# Patient Record
Sex: Male | Born: 1955 | Race: Black or African American | Hispanic: No | Marital: Single | Smoking: Never smoker
Health system: Southern US, Community
[De-identification: ages and names within clinical notes are randomized; demographics above are authoritative.]

## PROBLEM LIST (undated history)

## (undated) DIAGNOSIS — R569 Unspecified convulsions: Secondary | ICD-10-CM

## (undated) DIAGNOSIS — I10 Essential (primary) hypertension: Secondary | ICD-10-CM

## (undated) DIAGNOSIS — E785 Hyperlipidemia, unspecified: Secondary | ICD-10-CM

## (undated) DIAGNOSIS — I509 Heart failure, unspecified: Secondary | ICD-10-CM

## (undated) DIAGNOSIS — R609 Edema, unspecified: Secondary | ICD-10-CM

## (undated) DIAGNOSIS — N183 Chronic kidney disease, stage 3 unspecified: Secondary | ICD-10-CM

## (undated) HISTORY — PX: NO PAST SURGERIES: SHX2092

---

## 2015-08-14 ENCOUNTER — Encounter: Payer: Self-pay | Admitting: Sports Medicine

## 2015-08-14 ENCOUNTER — Ambulatory Visit (INDEPENDENT_AMBULATORY_CARE_PROVIDER_SITE_OTHER): Payer: Medicare Other | Admitting: Sports Medicine

## 2015-08-14 DIAGNOSIS — B351 Tinea unguium: Secondary | ICD-10-CM | POA: Diagnosis not present

## 2015-08-14 DIAGNOSIS — L84 Corns and callosities: Secondary | ICD-10-CM

## 2015-08-14 DIAGNOSIS — M79673 Pain in unspecified foot: Secondary | ICD-10-CM

## 2015-08-14 DIAGNOSIS — L853 Xerosis cutis: Secondary | ICD-10-CM

## 2015-08-14 NOTE — Progress Notes (Signed)
Subjective: Nathan Shepherd is a 60 y.o. male patient seen today in office with complaint of painful callus and  thickened and elongated toenails; unable to trim. Patient denies history of Diabetes, Neuropathy, or Vascular disease. Patient has no other pedal complaints at this time.   [facility]  There are no active problems to display for this patient.   No current outpatient prescriptions on file prior to visit.   No current facility-administered medications on file prior to visit.     Not on File  Objective: Physical Exam  General: Well developed, nourished, no acute distress, awake, alert and oriented x 3  Vascular: Dorsalis pedis artery 2/4 bilateral, Posterior tibial artery 1/4 bilateral, skin temperature warm to warm proximal to distal bilateral lower extremities, no varicosities, scant pedal hair present bilateral.  Neurological: Gross sensation present via light touch bilateral.   Dermatological: Skin is warm, moderately dry but supple bilateral, Nails 1-10 are tender, long, thick, and discolored with mild subungal debris, no webspace macerations present bilateral, no open lesions present bilateral, + callus/hyperkeratotic tissue present left medial hallux. No signs of infection bilateral.  Musculoskeletal: No symptomatic boney deformities noted bilateral. Muscular strength within normal limits without painon range of motion. No pain with calf compression bilateral.  Assessment and Plan:  Problem List Items Addressed This Visit    None    Visit Diagnoses    Onychomycosis of toenail    -  Primary   Corn or callus       Dry skin       Foot pain, unspecified laterality         -Examined patient.  -Discussed treatment options for painful mycotic nails, callus and dry skin. -Mechanically debrided callus x 1 using sterile chisel blade and reduced mycotic nails with sterile nail nipper and dremel nail file without incident. -Recommend  daily skin emollients  -Patient to return in 3 months for follow up evaluation or sooner if symptoms worsen.  Asencion Islamitorya Caston Coopersmith, DPM

## 2015-11-20 ENCOUNTER — Ambulatory Visit (INDEPENDENT_AMBULATORY_CARE_PROVIDER_SITE_OTHER): Payer: Medicare Other | Admitting: Podiatry

## 2015-11-20 ENCOUNTER — Encounter: Payer: Self-pay | Admitting: Podiatry

## 2015-11-20 DIAGNOSIS — L603 Nail dystrophy: Secondary | ICD-10-CM

## 2015-11-20 DIAGNOSIS — B351 Tinea unguium: Secondary | ICD-10-CM | POA: Diagnosis not present

## 2015-11-20 DIAGNOSIS — E0843 Diabetes mellitus due to underlying condition with diabetic autonomic (poly)neuropathy: Secondary | ICD-10-CM

## 2015-11-20 DIAGNOSIS — L608 Other nail disorders: Secondary | ICD-10-CM

## 2015-11-20 DIAGNOSIS — M79609 Pain in unspecified limb: Secondary | ICD-10-CM | POA: Diagnosis not present

## 2015-12-06 NOTE — Progress Notes (Signed)
SUBJECTIVE Patient with a history of diabetes mellitus presents to office today complaining of elongated, thickened nails. Pain while ambulating in shoes. Patient is unable to trim their own nails.   Allergies  Allergen Reactions  . Penicillins Hives    OBJECTIVE General Patient is awake, alert, and oriented x 3 and in no acute distress. Derm Skin is dry and supple bilateral. Negative open lesions or macerations. Remaining integument unremarkable. Nails are tender, long, thickened and dystrophic with subungual debris, consistent with onychomycosis, 1-5 bilateral. No signs of infection noted. Vasc  DP and PT pedal pulses palpable bilaterally. Temperature gradient within normal limits.  Neuro Epicritic and protective threshold sensation diminished bilaterally.  Musculoskeletal Exam No symptomatic pedal deformities noted bilateral. Muscular strength within normal limits.  ASSESSMENT 1. Diabetes Mellitus w/ peripheral neuropathy 2. Onychomycosis of nail due to dermatophyte bilateral 3. Pain in foot bilateral  PLAN OF CARE 1. Patient evaluated today. 2. Instructed to maintain good pedal hygiene and foot care. Stressed importance of controlling blood sugar.  3. Mechanical debridement of nails 1-5 bilaterally performed using a nail nipper. Filed with dremel without incident.  4. Return to clinic in 3 mos.    Felecia ShellingBrent M Evans, DPM

## 2016-02-22 ENCOUNTER — Ambulatory Visit: Payer: Medicare Other | Admitting: Podiatry

## 2016-08-16 ENCOUNTER — Encounter (HOSPITAL_COMMUNITY): Payer: Self-pay | Admitting: Emergency Medicine

## 2016-08-16 ENCOUNTER — Inpatient Hospital Stay (HOSPITAL_COMMUNITY)
Admission: EM | Admit: 2016-08-16 | Discharge: 2016-08-19 | DRG: 100 | Disposition: A | Discharge status: Home / Self Care | Payer: Medicare Other | Attending: Emergency Medicine | Admitting: Emergency Medicine

## 2016-08-16 ENCOUNTER — Emergency Department (HOSPITAL_COMMUNITY): Payer: Medicare Other

## 2016-08-16 DIAGNOSIS — Z978 Presence of other specified devices: Secondary | ICD-10-CM

## 2016-08-16 DIAGNOSIS — Z79899 Other long term (current) drug therapy: Secondary | ICD-10-CM

## 2016-08-16 DIAGNOSIS — E785 Hyperlipidemia, unspecified: Secondary | ICD-10-CM | POA: Diagnosis present

## 2016-08-16 DIAGNOSIS — I480 Paroxysmal atrial fibrillation: Secondary | ICD-10-CM | POA: Diagnosis present

## 2016-08-16 DIAGNOSIS — G40909 Epilepsy, unspecified, not intractable, without status epilepticus: Secondary | ICD-10-CM | POA: Diagnosis not present

## 2016-08-16 DIAGNOSIS — E876 Hypokalemia: Secondary | ICD-10-CM | POA: Diagnosis present

## 2016-08-16 DIAGNOSIS — Z7901 Long term (current) use of anticoagulants: Secondary | ICD-10-CM

## 2016-08-16 DIAGNOSIS — Z8673 Personal history of transient ischemic attack (TIA), and cerebral infarction without residual deficits: Secondary | ICD-10-CM

## 2016-08-16 DIAGNOSIS — R32 Unspecified urinary incontinence: Secondary | ICD-10-CM | POA: Diagnosis present

## 2016-08-16 DIAGNOSIS — I509 Heart failure, unspecified: Secondary | ICD-10-CM | POA: Diagnosis present

## 2016-08-16 DIAGNOSIS — Z880 Allergy status to penicillin: Secondary | ICD-10-CM

## 2016-08-16 DIAGNOSIS — R4182 Altered mental status, unspecified: Secondary | ICD-10-CM | POA: Diagnosis not present

## 2016-08-16 DIAGNOSIS — Z9911 Dependence on respirator [ventilator] status: Secondary | ICD-10-CM

## 2016-08-16 DIAGNOSIS — R609 Edema, unspecified: Secondary | ICD-10-CM | POA: Insufficient documentation

## 2016-08-16 DIAGNOSIS — E1165 Type 2 diabetes mellitus with hyperglycemia: Secondary | ICD-10-CM | POA: Diagnosis present

## 2016-08-16 DIAGNOSIS — G9340 Encephalopathy, unspecified: Secondary | ICD-10-CM | POA: Diagnosis present

## 2016-08-16 DIAGNOSIS — Z7982 Long term (current) use of aspirin: Secondary | ICD-10-CM

## 2016-08-16 DIAGNOSIS — I672 Cerebral atherosclerosis: Secondary | ICD-10-CM | POA: Diagnosis present

## 2016-08-16 DIAGNOSIS — I130 Hypertensive heart and chronic kidney disease with heart failure and stage 1 through stage 4 chronic kidney disease, or unspecified chronic kidney disease: Secondary | ICD-10-CM | POA: Diagnosis present

## 2016-08-16 DIAGNOSIS — E1122 Type 2 diabetes mellitus with diabetic chronic kidney disease: Secondary | ICD-10-CM | POA: Diagnosis present

## 2016-08-16 DIAGNOSIS — N183 Chronic kidney disease, stage 3 (moderate): Secondary | ICD-10-CM | POA: Diagnosis present

## 2016-08-16 HISTORY — DX: Edema, unspecified: R60.9

## 2016-08-16 HISTORY — DX: Unspecified convulsions: R56.9

## 2016-08-16 HISTORY — DX: Heart failure, unspecified: I50.9

## 2016-08-16 HISTORY — DX: Chronic kidney disease, stage 3 unspecified: N18.30

## 2016-08-16 HISTORY — DX: Hyperlipidemia, unspecified: E78.5

## 2016-08-16 HISTORY — DX: Essential (primary) hypertension: I10

## 2016-08-16 HISTORY — DX: Chronic kidney disease, stage 3 (moderate): N18.3

## 2016-08-16 LAB — COMPREHENSIVE METABOLIC PANEL
ALBUMIN: 4.4 g/dL (ref 3.5–5.0)
ALT: 13 U/L — AB (ref 17–63)
AST: 17 U/L (ref 15–41)
Alkaline Phosphatase: 95 U/L (ref 38–126)
Anion gap: 6 (ref 5–15)
BILIRUBIN TOTAL: 0.4 mg/dL (ref 0.3–1.2)
BUN: 13 mg/dL (ref 6–20)
CHLORIDE: 99 mmol/L — AB (ref 101–111)
CO2: 31 mmol/L (ref 22–32)
Calcium: 8.9 mg/dL (ref 8.9–10.3)
Creatinine, Ser: 1.4 mg/dL — ABNORMAL HIGH (ref 0.61–1.24)
GFR calc Af Amer: >60 mL/min (ref >60)
GFR, EST NON AFRICAN AMERICAN: 54 mL/min — AB (ref >60)
GLUCOSE: 156 mg/dL — AB (ref 65–99)
Potassium: 3.9 mmol/L (ref 3.5–5.1)
Sodium: 136 mmol/L (ref 135–145)
Total Protein: 8.2 g/dL — ABNORMAL HIGH (ref 6.5–8.1)

## 2016-08-16 LAB — DIFFERENTIAL
BASOS ABS: 0.1 10*3/uL (ref 0.0–0.1)
BASOS PCT: 1 %
Eosinophils Absolute: 0.1 10*3/uL (ref 0.0–0.7)
Eosinophils Relative: 2 %
LYMPHS ABS: 2.3 10*3/uL (ref 0.7–4.0)
Lymphocytes Relative: 30 %
MONOS PCT: 10 %
Monocytes Absolute: 0.7 10*3/uL (ref 0.1–1.0)
NEUTROS ABS: 4.5 10*3/uL (ref 1.7–7.7)
NEUTROS PCT: 57 %

## 2016-08-16 LAB — BLOOD GAS, ARTERIAL
Acid-Base Excess: 1.8 mmol/L (ref 0.0–2.0)
Bicarbonate: 24.4 mmol/L (ref 20.0–28.0)
DRAWN BY: 28459
FIO2: 100
O2 SAT: 99.5 %
PATIENT TEMPERATURE: 37
PEEP: 5 cmH2O
PH ART: 7.248 — AB (ref 7.350–7.450)
RATE: 16 resp/min
VT: 570 mL
pO2, Arterial: 445 mmHg — ABNORMAL HIGH (ref 83.0–108.0)

## 2016-08-16 LAB — URINALYSIS, ROUTINE W REFLEX MICROSCOPIC
Bilirubin Urine: NEGATIVE
Glucose, UA: >=500 mg/dL — AB
KETONES UR: NEGATIVE mg/dL
Leukocytes, UA: NEGATIVE
Nitrite: NEGATIVE
PH: 6 (ref 5.0–8.0)
Protein, ur: >=300 mg/dL — AB
SPECIFIC GRAVITY, URINE: 1.014 (ref 1.005–1.030)

## 2016-08-16 LAB — CBC
HEMATOCRIT: 42.8 % (ref 39.0–52.0)
HEMOGLOBIN: 13.3 g/dL (ref 13.0–17.0)
MCH: 25.4 pg — ABNORMAL LOW (ref 26.0–34.0)
MCHC: 31.1 g/dL (ref 30.0–36.0)
MCV: 81.8 fL (ref 78.0–100.0)
Platelets: 213 10*3/uL (ref 150–400)
RBC: 5.23 MIL/uL (ref 4.22–5.81)
RDW: 14.7 % (ref 11.5–15.5)
WBC: 7.8 10*3/uL (ref 4.0–10.5)

## 2016-08-16 LAB — RAPID URINE DRUG SCREEN, HOSP PERFORMED
Amphetamines: NOT DETECTED
BARBITURATES: NOT DETECTED
BENZODIAZEPINES: NOT DETECTED
COCAINE: NOT DETECTED
Opiates: NOT DETECTED
TETRAHYDROCANNABINOL: NOT DETECTED

## 2016-08-16 LAB — APTT: APTT: 33 s (ref 24–36)

## 2016-08-16 LAB — I-STAT TROPONIN, ED: TROPONIN I, POC: 0 ng/mL (ref 0.00–0.08)

## 2016-08-16 LAB — I-STAT CHEM 8, ED
BUN: 14 mg/dL (ref 6–20)
CHLORIDE: 99 mmol/L — AB (ref 101–111)
CREATININE: 1.4 mg/dL — AB (ref 0.61–1.24)
Calcium, Ion: 1.21 mmol/L (ref 1.15–1.40)
GLUCOSE: 164 mg/dL — AB (ref 65–99)
HCT: 42 % (ref 39.0–52.0)
Hemoglobin: 14.3 g/dL (ref 13.0–17.0)
POTASSIUM: 4.4 mmol/L (ref 3.5–5.1)
Sodium: 139 mmol/L (ref 135–145)
TCO2: 33 mmol/L (ref 0–100)

## 2016-08-16 LAB — PROTIME-INR
INR: 1.79
Prothrombin Time: 21 seconds — ABNORMAL HIGH (ref 11.4–15.2)

## 2016-08-16 LAB — ETHANOL: Alcohol, Ethyl (B): <5 mg/dL (ref <5)

## 2016-08-16 LAB — CBG MONITORING, ED: Glucose-Capillary: 139 mg/dL — ABNORMAL HIGH (ref 65–99)

## 2016-08-16 MED ORDER — SUCCINYLCHOLINE CHLORIDE 20 MG/ML IJ SOLN
INTRAMUSCULAR | Status: AC
  Filled 2016-08-16: qty 1

## 2016-08-16 MED ORDER — ETOMIDATE 2 MG/ML IV SOLN
15.0000 mg | Freq: Once | INTRAVENOUS | Status: AC
  Administered 2016-08-16: 15 mg via INTRAVENOUS

## 2016-08-16 MED ORDER — PROPOFOL 1000 MG/100ML IV EMUL
5.0000 ug/kg/min | Freq: Once | INTRAVENOUS | Status: AC
  Administered 2016-08-16: 25.523 ug/kg/min via INTRAVENOUS
  Administered 2016-08-16: 12.762 ug/kg/min via INTRAVENOUS
  Administered 2016-08-16 – 2016-08-17 (×3): 25.523 ug/kg/min via INTRAVENOUS

## 2016-08-16 MED ORDER — SUCCINYLCHOLINE CHLORIDE 20 MG/ML IJ SOLN
75.0000 mg | Freq: Once | INTRAMUSCULAR | Status: AC
  Administered 2016-08-16: 75 mg via INTRAVENOUS

## 2016-08-16 MED ORDER — PROPOFOL 1000 MG/100ML IV EMUL
INTRAVENOUS | Status: AC
  Administered 2016-08-16: 12.762 ug/kg/min via INTRAVENOUS
  Filled 2016-08-16: qty 100

## 2016-08-16 MED ORDER — ETOMIDATE 2 MG/ML IV SOLN
10.0000 mg | Freq: Once | INTRAVENOUS | Status: AC
  Administered 2016-08-16: 10 mg via INTRAVENOUS

## 2016-08-16 MED ORDER — SUCCINYLCHOLINE CHLORIDE 20 MG/ML IJ SOLN
50.0000 mg | Freq: Once | INTRAMUSCULAR | Status: AC
  Administered 2016-08-16: 50 mg via INTRAVENOUS

## 2016-08-16 MED ORDER — SUCCINYLCHOLINE CHLORIDE 20 MG/ML IJ SOLN
125.0000 mg | Freq: Once | INTRAMUSCULAR | Status: AC
  Administered 2016-08-16: 125 mg via INTRAVENOUS

## 2016-08-16 NOTE — ED Notes (Signed)
Date and time results received: 08/16/16 2312   Test: CO2 Critical Value: 67.4  Name of Provider Notified: Dr. Adriana Simasook  Orders Received? Or Actions Taken?: N/A

## 2016-08-16 NOTE — Progress Notes (Signed)
ABG obtained. Following critical ABG value, ventilator rate increased from 16 to 20. Repeat ABG in I hour.

## 2016-08-16 NOTE — ED Provider Notes (Signed)
Procedure Name: Intubation Date/Time: 08/16/2016 10:19 PM Performed by: Maia PlanLONG, JOSHUA G Pre-anesthesia Checklist: Patient identified, Patient being monitored, Emergency Drugs available and Suction available Oxygen Delivery Method: Ambu bag Preoxygenation: Pre-oxygenation with 100% oxygen Induction Type: Rapid sequence Ventilation: Two handed mask ventilation required Laryngoscope Size: Glidescope and 4 Tube size: 7.0 mm Number of attempts: 1 Airway Equipment and Method: Video-laryngoscopy Placement Confirmation: ETT inserted through vocal cords under direct vision,  Positive ETCO2 and Breath sounds checked- equal and bilateral Secured at: 26 cm Tube secured with: ETT holder Difficulty Due To: Difficult Airway- due to limited oral opening, Difficult Airway- due to dentition, Difficult Airway- due to anterior larynx, Difficult Airway- due to large tongue and Difficult Airway-  due to edematous airway Future Recommendations: Recommend- induction with short-acting agent, and alternative techniques readily available      Alona BeneJoshua Long, MD   Long, Arlyss RepressJoshua G, MD 08/16/16 2221

## 2016-08-16 NOTE — ED Triage Notes (Signed)
Pt found by staff at facility to by incontinent of urine and drooling; upon transport to hospital pt became unresponsive; pt's pupils 2mm and slow to respond; cbg 104; pt is usually alert and oriented and able to walk

## 2016-08-16 NOTE — ED Provider Notes (Addendum)
[facility]-EMER[facility]ENCY DEPT Provider Note   CSN: [id number] Arrival date & time: 08/16/16  2150   An emergency department physician performed an initial assessment on this suspected stroke patient at 2150.  History   Chief Complaint Chief Complaint  Patient presents with  . unresponsive    HPI Nathan Shepherd is a 61 y.o. male.  Level V caveat for obtundation. Minimal history available to me. Patient apparently lives in a group home and be came "unresponsive".  No known prodromal  Illnesses.past medical history includes hypertension, chronic kidney disease, CHF, hyperlipidemia, seizures. No past emergency visits noticed in EPIC.      Past Medical History:  Diagnosis Date  . CHF (congestive heart failure) (HCC)   . CKD (chronic kidney disease) stage 3, GFR 30-59 ml/min   . Edema   . Hyperlipemia   . Hypertension   . Seizures [facility])     Patient Active Problem List   Diagnosis Date Noted  . Edema     No past surgical history on file.     Home Medications    Prior to Admission medications   Medication Sig Start Date End Date Taking? Authorizing Provider  aspirin EC 81 MG tablet Take 81 mg by mouth daily.   Yes [provider]  carvedilol (COREG) 12.5 MG tablet Take 12.5 mg by mouth 2 (two) times daily with a meal.   Yes [provider]  levETIRAcetam (KEPPRA) 500 MG tablet Take 500 mg by mouth 2 (two) times daily.   Yes [provider]  losartan (COZAAR) 50 MG tablet Take 50 mg by mouth daily. Take 2 tabs by  Mouth daily   Yes [provider]  NIFEdipine (PROCARDIA-XL/ADALAT CC) 60 MG 24 hr tablet Take 60 mg by mouth daily. Take 2 tabs my mouth once daily   Yes [provider]  rosuvastatin (CRESTOR) 20 MG tablet Take 20 mg by mouth daily.   Yes [provider]  torsemide (DEMADEX) 20 MG tablet Take 20 mg by mouth daily.   Yes [provider]  warfarin (COUMADIN) 7.5 MG tablet Take 7.5 mg by mouth daily.    Yes [provider]  warfarin (COUMADIN) 1 MG tablet Take 1 mg by mouth daily.    [provider]    Family History No family history on file.  Social History Social History  Substance Use Topics  . Smoking status: Unknown If Ever Smoked  . Smokeless tobacco: Not on file  . Alcohol use Not on file     Allergies   Penicillins   Review of Systems Review of Systems  Reason unable to perform ROS: obtundation.     Physical Exam Updated Vital Signs BP (!) 153/110   Pulse 82   Temp 98.6 F (37 C)   Resp (!) 21   Ht 6\' 4"  (1.93 m)   Wt 130.6 kg (288 lb)   SpO2 100%   BMI 35.06 kg/m   Physical Exam  Constitutional: He appears well-developed and well-nourished.  obtunded  HENT:  Head: Normocephalic and atraumatic.  Eyes: Conjunctivae are normal.  Neck: Neck supple.  Cardiovascular: Normal rate and regular rhythm.   Pulmonary/Chest: Effort normal and breath sounds normal.  Abdominal: Soft. Bowel sounds are normal.  Musculoskeletal:  unable  Neurological:  obtunded  Skin: Skin is warm and dry.  Psychiatric:  unable  Nursing note and vitals reviewed.    ED Treatments / Results  Labs (all labs ordered are listed, but only abnormal results  are displayed) Labs Reviewed  PROTIME-INR - Abnormal; Notable for the following:       Result Value   Prothrombin Time 21.0 (*)    All other components within normal limits  CBC - Abnormal; Notable for the following:    MCH 25.4 (*)    All other components within normal limits  COMPREHENSIVE METABOLIC PANEL - Abnormal; Notable for the following:    Chloride 99 (*)    Glucose, Bld 156 (*)    Creatinine, Ser 1.40 (*)    Total Protein 8.2 (*)    ALT 13 (*)    GFR calc non Af Amer 54 (*)    All other components within normal limits  URINALYSIS, ROUTINE W REFLEX MICROSCOPIC - Abnormal; Notable for the following:    Glucose, UA >=500 (*)    Hgb urine dipstick SMALL (*)    Protein, ur >=300 (*)     Bacteria, UA RARE (*)    Squamous Epithelial / LPF 0-5 (*)    All other components within normal limits  BLOOD GAS, ARTERIAL - Abnormal; Notable for the following:    pH, Arterial 7.248 (*)    pO2, Arterial 445 (*)    All other components within normal limits  CBG MONITORING, ED - Abnormal; Notable for the following:    Glucose-Capillary 139 (*)    All other components within normal limits  I-STAT CHEM 8, ED - Abnormal; Notable for the following:    Chloride 99 (*)    Creatinine, Ser 1.40 (*)    Glucose, Bld 164 (*)    All other components within normal limits  ETHANOL  APTT  DIFFERENTIAL  RAPID URINE DRUG SCREEN, HOSP PERFORMED  BLOOD GAS, ARTERIAL  I-STAT TROPONIN, ED  CBG MONITORING, ED    EKG  EKG Interpretation None       Radiology Dg Chest Port 1 View  Result Date: 08/16/2016 CLINICAL DATA:  Respiratory failure. EXAM: PORTABLE CHEST 1 VIEW COMPARISON:  None. FINDINGS: Endotracheal tube tip is at the level of the clavicular heads, 7 cm above the inferior margin of the carina. There is cardiomegaly with mild pulmonary vascular congestion without overt edema. No pneumothorax or sizable pleural effusion. IMPRESSION: Endotracheal tube tip at the level of the clavicular heads. Massive cardiomegaly without overt pulmonary edema. Electronically Signed   By: Deatra Robinson M.D.   On: 08/16/2016 22:52   Ct Head Code Stroke Wo Contrast`  Result Date: 08/16/2016 CLINICAL DATA:  Code stroke. EXAM: CT HEAD WITHOUT CONTRAST TECHNIQUE: Contiguous axial images were obtained from the base of the skull through the vertex without intravenous contrast. COMPARISON:  None. FINDINGS: Brain: No mass lesion or acute hemorrhage. Hypoattenuation in the left occipital lobe, likely old left PCA infarct. There is periventricular hypoattenuation compatible with chronic microvascular disease. Old left thalamic lacunar infarct. Vascular: No hyperdense vessel. No advanced atherosclerotic calcification of the  arteries at the skull base. Skull: Normal visualized skull base, calvarium and extracranial soft tissues. Sinuses/Orbits: Extensive opacification of the paranasal sinuses, including fluid levels sphenoid sinuses. This is probably partially due to intubation. There may be superimposed element of chronic sinusitis. Normal orbits. ASPECTS [facility] Stroke Program Early CT Score) - Ganglionic level infarction (caudate, lentiform nuclei, internal capsule, insula, M1-M3 cortex): 7 - Supraganglionic infarction (M4-M6 cortex): 3 Total score (0-10 with 10 being normal): 10 IMPRESSION: 1. No acute hemorrhage or mass lesion. 2. ASPECTS is 10. 3. 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 These results were called  by telephone at the time of interpretation on 08/16/2016 at 10:56 pm to Dr. Donnetta HutchingBRIAN Le Ferraz , who verbally acknowledged these results. Electronically Signed   By: Deatra RobinsonKevin  Herman M.D.   On: 08/16/2016 22:57    Procedures Procedures (including critical care time)  Medications Ordered in ED Medications  succinylcholine (ANECTINE) 20 MG/ML injection (not administered)  propofol (DIPRIVAN) 1000 MG/100ML infusion (31.904 mcg/kg/min  130.6 kg Intravenous Rate/Dose Change 08/16/16 2301)  etomidate (AMIDATE) injection 15 mg (15 mg Intravenous Given 08/16/16 2155)  succinylcholine (ANECTINE) injection 125 mg (125 mg Intravenous Given 08/16/16 2156)  succinylcholine (ANECTINE) injection 50 mg (50 mg Intravenous Given 08/16/16 2215)  succinylcholine (ANECTINE) injection 75 mg (75 mg Intravenous Given 08/16/16 2210)  etomidate (AMIDATE) injection 10 mg (10 mg Intravenous Given 08/16/16 2215)  succinylcholine (ANECTINE) injection 50 mg (50 mg Intravenous Given 08/16/16 2200)     Initial Impression / Assessment and Plan / ED Course  I have reviewed the triage vital signs and the nursing notes.  Pertinent labs & imaging results that were available during my care of the patient were reviewed by me and considered  in my medical decision making (see chart for details).     Uncertain etiology of patient's obtundation.  Glucose was normal.CT head showed evidence of old stroke but no acute hemorrhage.  Drug screen negative. Chest x-ray shows no pneumonia. Urinalysis neg. Code stroke initiated. Patient was intubated. Neurologist did not think thrombolytics were appropriate secondary to increased INR.  [facility].  [id number]:  [facility] Performed by: Donnetta HutchingOOK,Antolin Belsito  ?  Total critical care time: 50 minutes  Critical care time was exclusive of separately billable procedures and treating other patients.  Critical care was necessary to treat or prevent imminent or life-threatening deterioration.  Critical care was time spent personally by me on the following activities: development of treatment plan with patient and/or surrogate as well as nursing, discussions with consultants, evaluation of patient's response to treatment, examination of patient, obtaining history from patient or surrogate, ordering and performing treatments and interventions, ordering and review of laboratory studies, ordering and review of radiographic studies, pulse oximetry and re-evaluation of patient's condition.  Final Clinical Impressions(s) / ED Diagnoses   Final diagnoses:  Altered mental status, unspecified altered mental status type  Endotracheally intubated    New Prescriptions New Prescriptions   No medications on file     Donnetta Hutchingook, Elzie Sheets, MD 08/16/16 2359    Donnetta Hutchingook, Ayad Nieman, MD 08/17/16 [phone]    Donnetta Hutchingook, Odell Fasching, MD 08/17/16 [phone]

## 2016-08-16 NOTE — ED Notes (Signed)
Code Stroke activated @ 2145 prior to arrival via in code of EMS. SOC activated @ 2200

## 2016-08-17 ENCOUNTER — Encounter (HOSPITAL_COMMUNITY): Payer: Self-pay

## 2016-08-17 ENCOUNTER — Inpatient Hospital Stay (HOSPITAL_COMMUNITY): Payer: Medicare Other

## 2016-08-17 DIAGNOSIS — R569 Unspecified convulsions: Secondary | ICD-10-CM | POA: Diagnosis not present

## 2016-08-17 DIAGNOSIS — J9600 Acute respiratory failure, unspecified whether with hypoxia or hypercapnia: Secondary | ICD-10-CM | POA: Diagnosis not present

## 2016-08-17 DIAGNOSIS — Z880 Allergy status to penicillin: Secondary | ICD-10-CM | POA: Diagnosis not present

## 2016-08-17 DIAGNOSIS — I509 Heart failure, unspecified: Secondary | ICD-10-CM | POA: Diagnosis not present

## 2016-08-17 DIAGNOSIS — E785 Hyperlipidemia, unspecified: Secondary | ICD-10-CM | POA: Diagnosis not present

## 2016-08-17 DIAGNOSIS — I517 Cardiomegaly: Secondary | ICD-10-CM

## 2016-08-17 DIAGNOSIS — G40909 Epilepsy, unspecified, not intractable, without status epilepticus: Secondary | ICD-10-CM | POA: Diagnosis present

## 2016-08-17 DIAGNOSIS — E1165 Type 2 diabetes mellitus with hyperglycemia: Secondary | ICD-10-CM | POA: Diagnosis not present

## 2016-08-17 DIAGNOSIS — Z7901 Long term (current) use of anticoagulants: Secondary | ICD-10-CM | POA: Diagnosis not present

## 2016-08-17 DIAGNOSIS — I130 Hypertensive heart and chronic kidney disease with heart failure and stage 1 through stage 4 chronic kidney disease, or unspecified chronic kidney disease: Secondary | ICD-10-CM | POA: Diagnosis not present

## 2016-08-17 DIAGNOSIS — R4182 Altered mental status, unspecified: Secondary | ICD-10-CM | POA: Diagnosis present

## 2016-08-17 DIAGNOSIS — N183 Chronic kidney disease, stage 3 (moderate): Secondary | ICD-10-CM | POA: Diagnosis not present

## 2016-08-17 DIAGNOSIS — G9340 Encephalopathy, unspecified: Secondary | ICD-10-CM | POA: Diagnosis not present

## 2016-08-17 DIAGNOSIS — I672 Cerebral atherosclerosis: Secondary | ICD-10-CM | POA: Diagnosis not present

## 2016-08-17 DIAGNOSIS — Z79899 Other long term (current) drug therapy: Secondary | ICD-10-CM | POA: Diagnosis not present

## 2016-08-17 DIAGNOSIS — R32 Unspecified urinary incontinence: Secondary | ICD-10-CM | POA: Diagnosis not present

## 2016-08-17 DIAGNOSIS — Z8673 Personal history of transient ischemic attack (TIA), and cerebral infarction without residual deficits: Secondary | ICD-10-CM | POA: Diagnosis not present

## 2016-08-17 DIAGNOSIS — E1122 Type 2 diabetes mellitus with diabetic chronic kidney disease: Secondary | ICD-10-CM | POA: Diagnosis not present

## 2016-08-17 DIAGNOSIS — Z7982 Long term (current) use of aspirin: Secondary | ICD-10-CM | POA: Diagnosis not present

## 2016-08-17 DIAGNOSIS — E876 Hypokalemia: Secondary | ICD-10-CM | POA: Diagnosis not present

## 2016-08-17 DIAGNOSIS — I480 Paroxysmal atrial fibrillation: Secondary | ICD-10-CM | POA: Diagnosis not present

## 2016-08-17 LAB — ECHOCARDIOGRAM COMPLETE
HEIGHTINCHES: 76 in
WEIGHTICAEL: 4091.74 [oz_av]

## 2016-08-17 LAB — HEPARIN LEVEL (UNFRACTIONATED): Heparin Unfractionated: 0.25 IU/mL — ABNORMAL LOW (ref 0.30–0.70)

## 2016-08-17 LAB — BASIC METABOLIC PANEL
Anion gap: 10 (ref 5–15)
BUN: 9 mg/dL (ref 6–20)
CHLORIDE: 100 mmol/L — AB (ref 101–111)
CO2: 29 mmol/L (ref 22–32)
Calcium: 9.4 mg/dL (ref 8.9–10.3)
Creatinine, Ser: 1.37 mg/dL — ABNORMAL HIGH (ref 0.61–1.24)
GFR calc non Af Amer: 55 mL/min — ABNORMAL LOW (ref >60)
Glucose, Bld: 112 mg/dL — ABNORMAL HIGH (ref 65–99)
POTASSIUM: 4.1 mmol/L (ref 3.5–5.1)
Sodium: 139 mmol/L (ref 135–145)

## 2016-08-17 LAB — MAGNESIUM: MAGNESIUM: 1.9 mg/dL (ref 1.7–2.4)

## 2016-08-17 LAB — GLUCOSE, CAPILLARY
GLUCOSE-CAPILLARY: 108 mg/dL — AB (ref 65–99)
GLUCOSE-CAPILLARY: 116 mg/dL — AB (ref 65–99)
GLUCOSE-CAPILLARY: 143 mg/dL — AB (ref 65–99)
Glucose-Capillary: 111 mg/dL — ABNORMAL HIGH (ref 65–99)
Glucose-Capillary: 184 mg/dL — ABNORMAL HIGH (ref 65–99)
Glucose-Capillary: 137 mg/dL — ABNORMAL HIGH (ref 65–99)

## 2016-08-17 LAB — APTT: aPTT: 45 seconds — ABNORMAL HIGH (ref 24–36)

## 2016-08-17 LAB — AMMONIA: AMMONIA: 42 umol/L — AB (ref 9–35)

## 2016-08-17 LAB — PROCALCITONIN: PROCALCITONIN: 0.11 ng/mL

## 2016-08-17 LAB — BLOOD GAS, ARTERIAL
Acid-Base Excess: 3.6 mmol/L — ABNORMAL HIGH (ref 0.0–2.0)
BICARBONATE: 27 mmol/L (ref 20.0–28.0)
Drawn by: 28459
FIO2: 40
O2 Saturation: 98.7 %
PATIENT TEMPERATURE: 37
PCO2 ART: 50.8 mmHg — AB (ref 32.0–48.0)
PEEP: 5 cmH2O
PO2 ART: 148 mmHg — AB (ref 83.0–108.0)
RATE: 20 resp/min
VT: 570 mL
pH, Arterial: 7.368 (ref 7.350–7.450)

## 2016-08-17 LAB — CBC
HEMATOCRIT: 42.9 % (ref 39.0–52.0)
HEMOGLOBIN: 13.3 g/dL (ref 13.0–17.0)
MCH: 24.9 pg — ABNORMAL LOW (ref 26.0–34.0)
MCHC: 31 g/dL (ref 30.0–36.0)
MCV: 80.3 fL (ref 78.0–100.0)
Platelets: 182 10*3/uL (ref 150–400)
RBC: 5.34 MIL/uL (ref 4.22–5.81)
RDW: 15.1 % (ref 11.5–15.5)
WBC: 7.2 10*3/uL (ref 4.0–10.5)

## 2016-08-17 LAB — MRSA PCR SCREENING: MRSA by PCR: NEGATIVE

## 2016-08-17 LAB — LACTIC ACID, PLASMA
Lactic Acid, Venous: 2.1 mmol/L (ref 0.5–1.9)
Lactic Acid, Venous: 1.2 mmol/L (ref 0.5–1.9)

## 2016-08-17 LAB — PHOSPHORUS: Phosphorus: 1.6 mg/dL — ABNORMAL LOW (ref 2.5–4.6)

## 2016-08-17 MED ORDER — SODIUM CHLORIDE 0.9 % IV SOLN
500.0000 mg | Freq: Once | INTRAVENOUS | Status: AC
  Administered 2016-08-17: 500 mg via INTRAVENOUS
  Filled 2016-08-17: qty 5

## 2016-08-17 MED ORDER — METRONIDAZOLE IN NACL 5-0.79 MG/ML-% IV SOLN
500.0000 mg | Freq: Three times a day (TID) | INTRAVENOUS | Status: DC
  Administered 2016-08-17 – 2016-08-18 (×4): 500 mg via INTRAVENOUS
  Filled 2016-08-17 (×6): qty 100

## 2016-08-17 MED ORDER — PROPOFOL 1000 MG/100ML IV EMUL
INTRAVENOUS | Status: AC
  Filled 2016-08-17: qty 100

## 2016-08-17 MED ORDER — CEFEPIME HCL 1 G IJ SOLR
1.0000 g | Freq: Three times a day (TID) | INTRAMUSCULAR | Status: DC
  Administered 2016-08-17 – 2016-08-18 (×4): 1 g via INTRAVENOUS
  Filled 2016-08-17 (×5): qty 1

## 2016-08-17 MED ORDER — HYDRALAZINE HCL 20 MG/ML IJ SOLN: 10.0000 mg | Freq: Four times a day (QID) | INTRAMUSCULAR | Status: DC | PRN

## 2016-08-17 MED ORDER — SODIUM CHLORIDE 0.9 % IV SOLN
1000.0000 mg | Freq: Two times a day (BID) | INTRAVENOUS | Status: DC
  Administered 2016-08-18: 1000 mg via INTRAVENOUS
  Filled 2016-08-17: qty 10

## 2016-08-17 MED ORDER — SODIUM PHOSPHATES 45 MMOLE/15ML IV SOLN
20.0000 mmol | Freq: Once | INTRAVENOUS | Status: AC
  Administered 2016-08-17: 20 mmol via INTRAVENOUS
  Filled 2016-08-17: qty 6.67

## 2016-08-17 MED ORDER — INSULIN ASPART 100 UNIT/ML ~~LOC~~ SOLN
2.0000 [IU] | SUBCUTANEOUS | Status: DC
  Administered 2016-08-17: 2 [IU] via SUBCUTANEOUS
  Administered 2016-08-17: 4 [IU] via SUBCUTANEOUS
  Administered 2016-08-18: 2 [IU] via SUBCUTANEOUS

## 2016-08-17 MED ORDER — PROPOFOL 1000 MG/100ML IV EMUL: 5.0000 ug/kg/min | INTRAVENOUS | Status: DC

## 2016-08-17 MED ORDER — LORAZEPAM 2 MG/ML IJ SOLN
INTRAMUSCULAR | Status: AC
  Filled 2016-08-17: qty 1

## 2016-08-17 MED ORDER — WARFARIN SODIUM 7.5 MG PO TABS
7.5000 mg | ORAL_TABLET | Freq: Once | ORAL | Status: AC
  Administered 2016-08-17: 7.5 mg via ORAL
  Filled 2016-08-17: qty 1

## 2016-08-17 MED ORDER — SODIUM CHLORIDE 0.9 % IV SOLN: 250.0000 mL | INTRAVENOUS | Status: DC | PRN

## 2016-08-17 MED ORDER — SODIUM CHLORIDE 0.9 % IV SOLN
INTRAVENOUS | Status: DC
  Administered 2016-08-17: 04:00:00 via INTRAVENOUS

## 2016-08-17 MED ORDER — MAGNESIUM SULFATE 2 GM/50ML IV SOLN
2.0000 g | Freq: Once | INTRAVENOUS | Status: AC
Start: 2016-08-17 — End: 2016-08-17
  Administered 2016-08-17: 2 g via INTRAVENOUS
  Filled 2016-08-17: qty 50

## 2016-08-17 MED ORDER — CARVEDILOL 12.5 MG PO TABS
12.5000 mg | ORAL_TABLET | Freq: Two times a day (BID) | ORAL | Status: DC
  Administered 2016-08-17 – 2016-08-19 (×4): 12.5 mg via ORAL
  Filled 2016-08-17 (×5): qty 1

## 2016-08-17 MED ORDER — HEPARIN SODIUM (PORCINE) 5000 UNIT/ML IJ SOLN: 5000.0000 [IU] | Freq: Three times a day (TID) | INTRAMUSCULAR | Status: DC

## 2016-08-17 MED ORDER — WARFARIN - PHYSICIAN DOSING INPATIENT
Freq: Every day | Status: DC
  Administered 2016-08-17: 18:00:00

## 2016-08-17 MED ORDER — HEPARIN (PORCINE) IN NACL 100-0.45 UNIT/ML-% IJ SOLN
1550.0000 [IU]/h | INTRAMUSCULAR | Status: DC
  Administered 2016-08-17: 1400 [IU]/h via INTRAVENOUS
  Filled 2016-08-17 (×3): qty 250

## 2016-08-17 MED ORDER — IOPAMIDOL (ISOVUE-370) INJECTION 76%
100.0000 mL | Freq: Once | INTRAVENOUS | Status: AC | PRN
  Administered 2016-08-17: 100 mL via INTRAVENOUS

## 2016-08-17 MED ORDER — PANTOPRAZOLE SODIUM 40 MG IV SOLR
40.0000 mg | Freq: Every day | INTRAVENOUS | Status: DC
  Administered 2016-08-17: 40 mg via INTRAVENOUS
  Filled 2016-08-17: qty 40

## 2016-08-17 MED ORDER — SODIUM CHLORIDE 0.9 % IV SOLN
500.0000 mg | Freq: Two times a day (BID) | INTRAVENOUS | Status: DC
  Administered 2016-08-17 (×2): 500 mg via INTRAVENOUS
  Filled 2016-08-17 (×3): qty 5

## 2016-08-17 MED ORDER — PROPOFOL 1000 MG/100ML IV EMUL
5.0000 ug/kg/min | INTRAVENOUS | Status: DC
  Administered 2016-08-17: 50 ug/kg/min via INTRAVENOUS
  Administered 2016-08-17: 51.046 ug/kg/min via INTRAVENOUS
  Filled 2016-08-17: qty 100

## 2016-08-17 MED ORDER — LORAZEPAM 2 MG/ML IJ SOLN
2.0000 mg | Freq: Once | INTRAMUSCULAR | Status: AC
  Administered 2016-08-17: 2 mg via INTRAVENOUS

## 2016-08-17 MED ORDER — SODIUM CHLORIDE 0.9 % IV BOLUS (SEPSIS)
1000.0000 mL | Freq: Once | INTRAVENOUS | Status: AC
  Administered 2016-08-17: 1000 mL via INTRAVENOUS

## 2016-08-17 NOTE — Progress Notes (Signed)
Code stroke  Call from ER  2133 EMS in route Bee[place]   2136 Exam started  2235 Pt. Intubated prior to CT Exam finished  2241 SOC   2240 Complete in epic 2241 RAD called  2242  Pt. Intubated when arriving in ER from EMS.

## 2016-08-17 NOTE — Evaluation (Signed)
Clinical/Bedside Swallow Evaluation Patient Details  Name: Nathan Shepherd MRN: [id number] Date of Birth: 05/15/1955  Today's Date: 08/17/2016 Time: SLP Start Time (ACUTE ONLY): 1254 SLP Stop Time (ACUTE ONLY): 1308 SLP Time Calculation (min) (ACUTE ONLY): 14 min  Past Medical History:  Past Medical History:  Diagnosis Date  . CHF (congestive heart failure) (HCC)   . CKD (chronic kidney disease) stage 3, GFR 30-59 ml/min   . Edema   . Hyperlipemia   . Hypertension   . Seizures (HCC)    Past Surgical History: No past surgical history on file. HPI:  Pt is a 61 year old male with PMH of PAF (on coumadin), CHF, HTN, CKD stage 3, HLD, HTN, and Seizures. Intubated for airway protection, extubated following 24 hour intubation this date.    Assessment / Plan / Recommendation Clinical Impression  Pts swallow presents within functional limits despite 1 day duration on ventilator with extubation this morning. No overt s/sx of aspiration with any consistencies. Recommend regular thin diet with medicines whole. Reviewed safe swallow precuations with pt who verbalized understanding. No further ST needs identified.   SLP Visit Diagnosis: Dysphagia, unspecified (R13.10)    Aspiration Risk  Mild aspiration risk    Diet Recommendation   Regular, thin liquids   Medication Administration: Whole meds with liquid    Other  Recommendations Oral Care Recommendations: Oral care BID   Follow up Recommendations None      Frequency and Duration            Prognosis Prognosis for Safe Diet Advancement: Good      Swallow Study   General Date of Onset: 08/16/16 HPI: Pt is a 61 year old male with PMH of PAF (on coumadin), CHF, HTN, CKD stage 3, HLD, HTN, and Seizures. Intubated for airway protection, extubated following 24 hour intubation this date.  Type of Study: Bedside Swallow Evaluation Previous Swallow Assessment: none on file  Temperature Spikes Noted: Yes Respiratory Status: Room  air History of Recent Intubation: Yes Length of Intubations (days): 1 days Date extubated: 08/17/16 Behavior/Cognition: Alert;Cooperative;Pleasant mood Oral Cavity Assessment: Within Functional Limits Oral Care Completed by SLP: Yes Oral Cavity - Dentition: Missing dentition Vision: Functional for self-feeding Self-Feeding Abilities: Able to feed self Patient Positioning: Upright in bed Baseline Vocal Quality: Normal Volitional Cough: Strong Volitional Swallow: Able to elicit    Oral/Motor/Sensory Function Overall Oral Motor/Sensory Function: Within functional limits   Ice Chips Ice chips: Impaired Presentation: Spoon Pharyngeal Phase Impairments: Multiple swallows;Throat Clearing - Delayed   Thin Liquid Thin Liquid: Within functional limits    Nectar Thick Nectar Thick Liquid: Not tested   Honey Thick Honey Thick Liquid: Not tested   Puree Puree: Within functional limits   Solid   GO   Solid: Within functional limits       Marcene Duoshelsea Sumney MA, CCC-SLP Acute Care Speech Language Pathologist    Kennieth RadChelsea E Sumney 08/17/2016,1:16 PM

## 2016-08-17 NOTE — Procedures (Signed)
Extubation Procedure Note  Patient Details:   Name: Nathan Shepherd DOB: 09/08/1955 MRN: [id number]   Airway Documentation:     Evaluation  O2 sats: stable throughout Complications: No apparent complications Patient did tolerate procedure well. Bilateral Breath Sounds: Diminished, Clear   Patient was extubated to a 4L [place] per MD order. Cuff leak was heard prior to extubation. No stridor noted afterwards. RN at bedside with RT during extubation. Patient able to say his name after extubation.   Yes  Darolyn Ruashley M Sricharan Lacomb 08/17/2016, 10:28 AM

## 2016-08-17 NOTE — Progress Notes (Signed)
EEG Completed; Results Pending  

## 2016-08-17 NOTE — Progress Notes (Signed)
CRITICAL VALUE ALERT  Critical Value:  Lactic Acid 2.1  Date & Time Notied: 08/17/16 0600  Provider Notified: NP Jovita KussmaulKatalina Eubanks  Orders Received/Actions taken: Reported lab values.

## 2016-08-17 NOTE — Progress Notes (Signed)
Pt transported to CT on ventilator without incident

## 2016-08-17 NOTE — Progress Notes (Signed)
Pt transported to CT without incident on ventilator

## 2016-08-17 NOTE — ED Provider Notes (Signed)
[facility] here to transport patient. He is unresponsive, on a vent. BP is stable.   EMTALA updated.   Devoria AlbeIva Lashaunda Schild, MD, Concha PyoFACEP    Garet Hooton, MD 08/17/16 [phone]

## 2016-08-17 NOTE — ED Notes (Signed)
Report to Nick, RN

## 2016-08-17 NOTE — Progress Notes (Signed)
  Echocardiogram 2D Echocardiogram has been performed.  Nathan SavoyCasey N Ikechukwu Shepherd 08/17/2016, 12:26 PM

## 2016-08-17 NOTE — Progress Notes (Signed)
ANTICOAGULATION CONSULT NOTE - Follow Up Consult  Pharmacy Consult for heparin Indication: atrial fibrillation and stroke  Allergies  Allergen Reactions  . Penicillins Hives    Patient Measurements: Height: 6\' 4"  (193 cm) Weight: 255 lb 11.7 oz (116 kg) IBW/kg (Calculated) : 86.8 Heparin Dosing Weight: 115 kg  Vital Signs: Temp: 99.7 F (37.6 C) (08/15 1200) Temp Source: Oral (08/15 1200) BP: 141/99 (08/15 1320) Pulse Rate: 77 (08/15 1300)  Labs:  Recent Labs  08/16/16 2200 08/16/16 2303 08/17/16 0450 08/17/16 1001  HGB 13.3 14.3 13.3  --   HCT 42.8 42.0 42.9  --   PLT 213  --  182  --   APTT 33  --   --   --   LABPROT 21.0*  --   --   --   INR 1.79  --   --   --   HEPARINUNFRC  --   --   --  0.25*  CREATININE 1.40* 1.40* 1.37*  --     Estimated Creatinine Clearance: 81.9 mL/min (A) (by C-G formula based on SCr of 1.37 mg/dL (H)).  Assessment: CC/HPI:  61yo male found at group home incontinent of urine and drooling, [facility] and code stroke called, old stroke on CT but no acute findings.  PMH: CHF, CKD, HLD, HTN, seizures, hx of afib on warfarin  Anticoag: warfarin pta for hx of afib. Admit INR 1.79 On hep gtt 1400 units/hr with lvl 0.25  PTA warfarin dose 7.5 mg daily; last dose 8/14  Renal: SCr 1.37   Heme/Onc: H&H 13.3/42.9, Plt 185  Goal of Therapy:  Heparin level 0.3 - 035 units/ml Monitor platelets by anticoagulation protocol: Yes   Plan:  Increase Heparin to 1550 units/hr Next lvl 2000 Daily HL, CBC F/U warfarin restart Continue cefepime 1g q8h, flagyl per MD Monitor renal fx, cx, de-escalation  Isaac BlissMichael Deyra Perdomo, PharmD, BCPS, BCCCP Clinical Pharmacist Clinical phone for 08/17/2016 from 7a-3:30p: [phone] If after 3:30p, please call main pharmacy at: [phone] 08/17/2016 2:30 PM

## 2016-08-17 NOTE — Procedures (Signed)
ELECTROENCEPHALOGRAM REPORT  Date of Study: 08/17/2016  Patient's Name: Nathan Shepherd MRN: [id number] Date of Birth: 29-Mar-1955  Referring Provider: Jovita KussmaulKatalina Eubanks, NP  Clinical History: This is a 61 year old man with sudden onset obtundation.   Medications: levETIRAcetam (KEPPRA) 500 mg in sodium chloride 0.9 % 100 mL IVPB  carvedilol (COREG) tablet 12.5 mg  ceFEPIme (MAXIPIME) 1 g in dextrose 5 % 50 mL IVPB  heparin ADULT infusion 100 units/mL (25000 units/22750mL sodium chloride 0.45%)  hydrALAZINE (APRESOLINE) injection 10 mg  insulin aspart (novoLOG) injection 2-6 Units  metroNIDAZOLE (FLAGYL) IVPB 500 mg  pantoprazole (PROTONIX) injection 40 mg  sodium phosphate 20 mmol in dextrose 5 % 250 mL infusion  succinylcholine (ANECTINE) 20 MG/ML injection   Technical Summary: A multichannel digital EEG recording measured by the international 10-20 system with electrodes applied with paste and impedances below 5000 ohms performed in our laboratory with EKG monitoring in an intubated awake and drowsy patient.  Hyperventilation and photic stimulation were not performed.  The digital EEG was referentially recorded, reformatted, and digitally filtered in a variety of bipolar and referential montages for optimal display.    Description: The patient is awake and drowsy during the recording. He is intubated, no sedating medications, follows commands.  During maximal wakefulness, there is a symmetric, medium voltage 8 Hz posterior dominant rhythm that attenuates with eye opening.  The record is symmetric.  During drowsiness, there is an increase in theta slowing of the background. Deeper stages of sleep were not seen.  Hyperventilation and photic stimulation were not performed.  There were no epileptiform discharges or electrographic seizures seen.    EKG lead showed irregular rhythm. Impression: This awake and drowsy EEG is normal.    Clinical Correlation: A normal EEG does not  exclude a clinical diagnosis of epilepsy. Clinical correlation is advised.   Patrcia DollyKaren Joeleen Wortley, M.D.

## 2016-08-17 NOTE — Progress Notes (Signed)
Patient transported to radiology for MRI with SWOT RN and transport tech. Patient stable on arrival.

## 2016-08-17 NOTE — Progress Notes (Signed)
Pharmacy Antibiotic Note  Erin FullingFrankie George Canevari is a 61 y.o. male admitted on 08/16/2016 with altered mental status.  Pharmacy has been consulted for Cefepime dosing for possible aspiration PNA. WBC WNL. Noted renal dysfunction.   Plan: -Cefepime 1g IV q8h -Flagyl 500 mg IV q8h -Trend WBC, temp, renal function  -F/U infectious work-up  Height: 6\' 4"  (193 cm) Weight: 255 lb 11.7 oz (116 kg) IBW/kg (Calculated) : 86.8  Temp (24hrs), Avg:99 F (37.2 C), Min:96.8 F (36 C), Max:100 F (37.8 C)   Recent Labs Lab 08/16/16 2200 08/16/16 2303 08/17/16 0450  WBC 7.8  --  7.2  CREATININE 1.40* 1.40* 1.37*    Estimated Creatinine Clearance: 81.9 mL/min (A) (by C-G formula based on SCr of 1.37 mg/dL (H)).    Allergies  Allergen Reactions  . Penicillins Hives    Abran DukeLedford, Leeana Creer 08/17/2016 5:48 AM

## 2016-08-17 NOTE — Progress Notes (Signed)
PULMONARY / CRITICAL CARE MEDICINE   Name: Nathan Shepherd MRN: [id number] DOB: 1955/06/14    ADMISSION DATE:  08/16/2016 CONSULTATION DATE:  08/17/2016  REFERRING MD:  Dr. Lynelle Doctor   CHIEF COMPLAINT:  Encephalopathy   HISTORY OF PRESENT ILLNESS:   61 year old male with PMH of PAF (on coumadin), CHF, HTN, CKD stage 3, HLD, HTN, and Seizures    On 8/14 patient was found unresponsive at group home with urinary incontinence. On arrival at [facility] ED patient was intubated for airway protection. CT Head negative for acute process. CTA with severe stenosis to V4 segment. UDS negative. Patient transferred to [facility] for further work-up. PCCM to admit.   SUBJECTIVE:  No evidence further seizure activity Patient tolerating spotting his breathing trial currently.  VITAL SIGNS: BP (!) 144/104   Pulse 72   Temp 99 F (37.2 C) (Axillary)   Resp 18   Ht 6\' 4"  (1.93 m)   Wt 116 kg (255 lb 11.7 oz)   SpO2 100%   BMI 31.13 kg/m   HEMODYNAMICS:    VENTILATOR SETTINGS: Vent Mode: PRVC FiO2 (%):  [40 %-100 %] 40 % Set Rate:  [16 bmp-20 bmp] 20 bmp Vt Set:  [570 mL] 570 mL PEEP:  [5 cmH20] 5 cmH20 Plateau Pressure:  [11 cmH20-20 cmH20] 11 cmH20  INTAKE / OUTPUT: I/O last 3 completed shifts: In: 100 [I.V.:100] Out: 615 [Urine:615]  PHYSICAL EXAMINATION: General:  Awake on mechanical ventilation, currently tolerating spotting breathing trial Neuro:  Awake, following commands, moved all extremities HEENT:  ET tube in place, no apparent oral lesions Cardiovascular: Irregular, no murmur Lungs: Clear bilaterally, decreased at both bases Abdomen: Soft, nondistended, positive bowel sounds Musculoskeletal: No significant edema Skin:  No rash  LABS:  BMET  Recent Labs Lab 08/16/16 2200 08/16/16 2303 08/17/16 0450  NA 136 139 139  K 3.9 4.4 4.1  CL 99* 99* 100*  CO2 31  --  29  BUN 13 14 9   CREATININE 1.40* 1.40* 1.37*  GLUCOSE 156* 164* 112*    Electrolytes  Recent  Labs Lab 08/16/16 2200 08/17/16 0450  CALCIUM 8.9 9.4  MG  --  1.9  PHOS  --  1.6*    CBC  Recent Labs Lab 08/16/16 2200 08/16/16 2303 08/17/16 0450  WBC 7.8  --  7.2  HGB 13.3 14.3 13.3  HCT 42.8 42.0 42.9  PLT 213  --  182    Coag's  Recent Labs Lab 08/16/16 2200  APTT 33  INR 1.79    Sepsis Markers  Recent Labs Lab 08/17/16 0450  LATICACIDVEN 2.1*  PROCALCITON 0.11    ABG  Recent Labs Lab 08/16/16 2253 08/17/16 0040  PHART 7.248* 7.368  PCO2ART RBV B.CRABTREE,RN BY K.WYATT,RRT,RCP ON 08/16/16 AT 23:10. 50.8*  PO2ART 445* 148*    Liver Enzymes  Recent Labs Lab 08/16/16 2200  AST 17  ALT 13*  ALKPHOS 95  BILITOT 0.4  ALBUMIN 4.4    Cardiac Enzymes No results for input(s): TROPONINI, PROBNP in the last 168 hours.  Glucose  Recent Labs Lab 08/16/16 2158 08/17/16 0415 08/17/16 0749  GLUCAP 139* 108* 111*    Imaging Ct Angio Head W Or Wo Contrast  Result Date: 08/17/2016 CLINICAL DATA:  Follow-up code stroke. Unresponsive. History of seizures, hypertension. EXAM: CT ANGIOGRAPHY HEAD AND NECK TECHNIQUE: Multidetector CT imaging of the head and neck was performed using the standard protocol during bolus administration of intravenous contrast. Multiplanar CT image reconstructions and MIPs  were obtained to evaluate the vascular anatomy. Carotid stenosis measurements (when applicable) are obtained utilizing NASCET criteria, using the distal internal carotid diameter as the denominator. CONTRAST:  100 cc Isovue 370 COMPARISON:  CT HEAD August 16, 2016 at 2236 hours FINDINGS: CTA NECK AORTIC ARCH: Normal appearance of the thoracic arch, normal branch pattern. Mild atherosclerosis. The origins of the innominate, left Common carotid artery and subclavian artery are widely patent. RIGHT CAROTID SYSTEM: Common carotid artery is widely patent. Normal appearance of the carotid bifurcation without hemodynamically significant stenosis by NASCET criteria.  Normal appearance of the included internal carotid artery, tonsillar loop. LEFT CAROTID SYSTEM: Common carotid artery is widely patent. Normal appearance of the carotid bifurcation without hemodynamically significant stenosis by NASCET criteria. Normal appearance of the included internal carotid artery. VERTEBRAL ARTERIES:Left vertebral artery is dominant. Normal appearance of the vertebral arteries, vertebral arteries are patent, mild extrinsic deformity due to degenerative cervical spine. SKELETON: No acute osseous process though bone windows have not been submitted. Severe C5-6 degenerative disc. Severe RIGHT C5-6 neural foraminal narrowing. OTHER NECK: Soft tissues of the neck are non-acute though, not tailored for evaluation. Life-support lines in place. Tree-in-bud infiltrates RIGHT upper lobe. Air debris level LEFT mainstem bronchus, incompletely imaged. CTA HEAD ANTERIOR CIRCULATION: Patent cervical internal carotid arteries, petrous, cavernous and supra clinoid internal carotid arteries. Mild stenosis RIGHT supraclinoid internal carotid artery. Mild calcific atherosclerosis carotid siphons. Widely patent anterior communicating artery. Patent anterior and middle cerebral arteries. No large vessel occlusion, significant stenosis, contrast extravasation or aneurysm. POSTERIOR CIRCULATION: Moderate stenosis proximal RIGHT V4 segment, severely stenotic distal RIGHT V4 segment distal to the posterior-inferior cerebellar artery origin. Vertebrobasilar junction and basilar artery, as well as main branch vessels. No large vessel occlusion, contrast extravasation or aneurysm. VENOUS SINUSES: Major dural venous sinuses are patent though not tailored for evaluation on this angiographic examination. ANATOMIC VARIANTS: None. DELAYED PHASE: No abnormal intracranial enhancement. MIP images reviewed. IMPRESSION: CTA NECK: 1. Mild atherosclerosis without hemodynamically significant stenosis or acute vascular process. 2.  Tree-in-bud infiltrates RIGHT upper lobe may be infectious or inflammatory. Debris within LEFT mainstem bronchus. CTA HEAD: 1. No emergent large vessel occlusion. 2. Mild intracranial atherosclerosis. Severe stenosis distal RIGHT V4 segment. Electronically Signed   By: Awilda Metro M.D.   On: 08/17/2016 01:34   Ct Angio Neck W Or Wo Contrast  Result Date: 08/17/2016 CLINICAL DATA:  Follow-up code stroke. Unresponsive. History of seizures, hypertension. EXAM: CT ANGIOGRAPHY HEAD AND NECK TECHNIQUE: Multidetector CT imaging of the head and neck was performed using the standard protocol during bolus administration of intravenous contrast. Multiplanar CT image reconstructions and MIPs were obtained to evaluate the vascular anatomy. Carotid stenosis measurements (when applicable) are obtained utilizing NASCET criteria, using the distal internal carotid diameter as the denominator. CONTRAST:  100 cc Isovue 370 COMPARISON:  CT HEAD August 16, 2016 at 2236 hours FINDINGS: CTA NECK AORTIC ARCH: Normal appearance of the thoracic arch, normal branch pattern. Mild atherosclerosis. The origins of the innominate, left Common carotid artery and subclavian artery are widely patent. RIGHT CAROTID SYSTEM: Common carotid artery is widely patent. Normal appearance of the carotid bifurcation without hemodynamically significant stenosis by NASCET criteria. Normal appearance of the included internal carotid artery, tonsillar loop. LEFT CAROTID SYSTEM: Common carotid artery is widely patent. Normal appearance of the carotid bifurcation without hemodynamically significant stenosis by NASCET criteria. Normal appearance of the included internal carotid artery. VERTEBRAL ARTERIES:Left vertebral artery is dominant. Normal appearance of the vertebral arteries, vertebral  arteries are patent, mild extrinsic deformity due to degenerative cervical spine. SKELETON: No acute osseous process though bone windows have not been submitted. Severe  C5-6 degenerative disc. Severe RIGHT C5-6 neural foraminal narrowing. OTHER NECK: Soft tissues of the neck are non-acute though, not tailored for evaluation. Life-support lines in place. Tree-in-bud infiltrates RIGHT upper lobe. Air debris level LEFT mainstem bronchus, incompletely imaged. CTA HEAD ANTERIOR CIRCULATION: Patent cervical internal carotid arteries, petrous, cavernous and supra clinoid internal carotid arteries. Mild stenosis RIGHT supraclinoid internal carotid artery. Mild calcific atherosclerosis carotid siphons. Widely patent anterior communicating artery. Patent anterior and middle cerebral arteries. No large vessel occlusion, significant stenosis, contrast extravasation or aneurysm. POSTERIOR CIRCULATION: Moderate stenosis proximal RIGHT V4 segment, severely stenotic distal RIGHT V4 segment distal to the posterior-inferior cerebellar artery origin. Vertebrobasilar junction and basilar artery, as well as main branch vessels. No large vessel occlusion, contrast extravasation or aneurysm. VENOUS SINUSES: Major dural venous sinuses are patent though not tailored for evaluation on this angiographic examination. ANATOMIC VARIANTS: None. DELAYED PHASE: No abnormal intracranial enhancement. MIP images reviewed. IMPRESSION: CTA NECK: 1. Mild atherosclerosis without hemodynamically significant stenosis or acute vascular process. 2. Tree-in-bud infiltrates RIGHT upper lobe may be infectious or inflammatory. Debris within LEFT mainstem bronchus. CTA HEAD: 1. No emergent large vessel occlusion. 2. Mild intracranial atherosclerosis. Severe stenosis distal RIGHT V4 segment. Electronically Signed   By: Awilda Metro M.D.   On: 08/17/2016 01:34   Dg Chest Port 1 View  Result Date: 08/17/2016 CLINICAL DATA:  Ventilator dependent. EXAM: PORTABLE CHEST 1 VIEW COMPARISON:  Radiograph of August 16, 2016. FINDINGS: Stable cardiomegaly. Endotracheal and nasogastric tubes are unchanged in position. No pneumothorax  or pleural effusion is noted. No acute pulmonary disease is noted. Bony thorax is unremarkable. IMPRESSION: Stable support apparatus.  No acute pulmonary disease is noted. Electronically Signed   By: Lupita Raider, M.D.   On: 08/17/2016 07:55   Dg Chest Port 1 View  Result Date: 08/16/2016 CLINICAL DATA:  Respiratory failure. EXAM: PORTABLE CHEST 1 VIEW COMPARISON:  None. FINDINGS: Endotracheal tube tip is at the level of the clavicular heads, 7 cm above the inferior margin of the carina. There is cardiomegaly with mild pulmonary vascular congestion without overt edema. No pneumothorax or sizable pleural effusion. IMPRESSION: Endotracheal tube tip at the level of the clavicular heads. Massive cardiomegaly without overt pulmonary edema. Electronically Signed   By: Deatra Robinson M.D.   On: 08/16/2016 22:52   Ct Head Code Stroke Wo Contrast`  Result Date: 08/16/2016 CLINICAL DATA:  Code stroke. EXAM: CT HEAD WITHOUT CONTRAST TECHNIQUE: Contiguous axial images were obtained from the base of the skull through the vertex without intravenous contrast. COMPARISON:  None. FINDINGS: Brain: No mass lesion or acute hemorrhage. Hypoattenuation in the left occipital lobe, likely old left PCA infarct. There is periventricular hypoattenuation compatible with chronic microvascular disease. Old left thalamic lacunar infarct. Vascular: No hyperdense vessel. No advanced atherosclerotic calcification of the arteries at the skull base. Skull: Normal visualized skull base, calvarium and extracranial soft tissues. Sinuses/Orbits: Extensive opacification of the paranasal sinuses, including fluid levels sphenoid sinuses. This is probably partially due to intubation. There may be superimposed element of chronic sinusitis. Normal orbits. ASPECTS [facility] Stroke Program Early CT Score) - Ganglionic level infarction (caudate, lentiform nuclei, internal capsule, insula, M1-M3 cortex): 7 - Supraganglionic infarction (M4-M6 cortex): 3  Total score (0-10 with 10 being normal): 10 IMPRESSION: 1. No acute hemorrhage or mass lesion. 2. ASPECTS is 10. 3.  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 These results were called by telephone at the time of interpretation on 08/16/2016 at 10:56 pm to Dr. Donnetta HutchingBRIAN COOK , who verbally acknowledged these results. Electronically Signed   By: Deatra RobinsonKevin  Herman M.D.   On: 08/16/2016 22:57     STUDIES:  CXR 8/14 > Massive Cardiomegaly without overt pulmonary edema  CT Head 8/14 > No acute. 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 Tree-in-bud infiltrates RIGHT upper lobe may be infectious or inflammatory. Debris within LEFT mainstem bronchus. No emergent large vessel occlusion. Mild intracranial atherosclerosis. Severe stenosis distal RIGHT V4 segment.  CULTURES: Blood 8/15 >> Sputum 8/15 >>   ANTIBIOTICS: Flagyl 8/15 >> Cefepime 8/15 >>   SIGNIFICANT EVENTS: 8/14 > Presents to OSH unresponsive   LINES/TUBES: ETT 8/14 >>  DISCUSSION: 61 year old male presents to [facility] unresponsive with reported event of urinary incontinence. CT negative for acute. V4 Segment with severe stenosis. History of seizures. [facility]ubated for airway protection [facility]   ASSESSMENT / PLAN:  PULMONARY A: Respiratory Insufficiency in setting of encephalopathy, suspect seizures P:   Tolerating spontaneous breathing trial currently, plan to extubate Pulmonary Hygiene   CARDIOVASCULAR A:  HTN PAF (on Coumadin)  H/O HLD, CHF (Unknown EF)  P:  Follow echo results Anticoagulation with heparin infusion, hold home warfarin Goal maintain MAP > 90 pending any new Neurology Recs Home carvedilol   RENAL A:   CKD stage 3  P:   Follow BMP, urine output Replace electrolytes as indicated  GASTROINTESTINAL A:   No issues  P:   Nothing by mouth until speech therapy  evaluation PPI   HEMATOLOGIC A:   On Chronic Anticoagulation  P:  Heparin drip Follow CBC  INFECTIOUS A:   ?Aspiration Event  -CT Chest with infiltrate in right upper lobe; chest x-ray 8/15 clear Febrile -Tmax 100.0  P:   Empiric cefepime, Flagyl given concern for possible aspiration event Follow culture data and chest x-ray, may be able to narrow antibiotics soon  ENDOCRINE A:   Hyperglycemia  H/O DM    P:   CBG and sliding scale insulin per protocol  NEUROLOGIC A:   Acute Encephalopathy  -CTA with severe stenosis of distal right V4 segment  H/O Seizures -UDS negative, ETOH <5 P:   RASS goal: 0 Appreciate neurology assistance EEG final result pending MRI brain pending today Keppra twice a day   FAMILY  - Updates: No family present 8/15  - Inter-disciplinary family meet or Palliative Care meeting due by:  08/24/16  Independent CC time 35 minutes  Levy Pupaobert Byrum, MD, PhD 08/17/2016, 10:16 AM [facility] [phone] or if no answer [phone]

## 2016-08-17 NOTE — Progress Notes (Signed)
ANTICOAGULATION CONSULT NOTE - Initial Consult  Pharmacy Consult for heparin Indication: atrial fibrillation  Allergies  Allergen Reactions  . Penicillins Hives    Patient Measurements: Height: 6\' 4"  (193 cm) Weight: 255 lb 11.7 oz (116 kg) IBW/kg (Calculated) : 86.8 Heparin Dosing Weight: 110kg  Vital Signs: Temp: 99 F (37.2 C) (08/15 0400) Temp Source: Axillary (08/15 0400) BP: 146/102 (08/15 0400) Pulse Rate: 73 (08/15 0400)  Labs:  Recent Labs  08/16/16 2200 08/16/16 2303  HGB 13.3 14.3  HCT 42.8 42.0  PLT 213  --   APTT 33  --   LABPROT 21.0*  --   INR 1.79  --   CREATININE 1.40* 1.40*    Estimated Creatinine Clearance: 80.1 mL/min (A) (by C-G formula based on SCr of 1.4 mg/dL (H)).   Medical History: Past Medical History:  Diagnosis Date  . CHF (congestive heart failure) (HCC)   . CKD (chronic kidney disease) stage 3, GFR 30-59 ml/min   . Edema   . Hyperlipemia   . Hypertension   . Seizures (HCC)     Medications:  Prescriptions Prior to Admission  Medication Sig Dispense Refill Last Dose  . aspirin EC 81 MG tablet Take 81 mg by mouth daily.   08/16/2016 at Unknown time  . carvedilol (COREG) 12.5 MG tablet Take 12.5 mg by mouth 2 (two) times daily with a meal.   08/16/2016 at Unknown time  . levETIRAcetam (KEPPRA) 500 MG tablet Take 500 mg by mouth 2 (two) times daily.   08/16/2016 at Unknown time  . losartan (COZAAR) 50 MG tablet Take 50 mg by mouth daily. Take 2 tabs by  Mouth daily   08/16/2016 at Unknown time  . NIFEdipine (PROCARDIA-XL/ADALAT CC) 60 MG 24 hr tablet Take 60 mg by mouth daily. Take 2 tabs my mouth once daily   08/16/2016 at Unknown time  . rosuvastatin (CRESTOR) 20 MG tablet Take 20 mg by mouth daily.   08/16/2016 at Unknown time  . torsemide (DEMADEX) 20 MG tablet Take 20 mg by mouth daily.   08/16/2016 at Unknown time  . warfarin (COUMADIN) 7.5 MG tablet Take 7.5 mg by mouth daily.   08/16/2016 at Unknown time  . warfarin (COUMADIN) 1  MG tablet Take 1 mg by mouth daily.   Unknown at Unknown time   Scheduled:  . carvedilol  12.5 mg Oral BID WC  . insulin aspart  2-6 Units Subcutaneous Q4H  . pantoprazole (PROTONIX) IV  40 mg Intravenous QHS  . succinylcholine       Infusions:  . sodium chloride    . levETIRAcetam    . propofol 25 mcg/kg/min (08/17/16 0347)  . propofol      Assessment: 61yo male found at group home incontinent of urine and drooling, [facility] and [facility], old stroke on CT but no acute findings, now to begin heparin bridge for Afib; admitted with low INR, last dose of Coumadin taken 8/14.  Goal of Therapy:  Heparin level 0.3-0.5 units/ml (increase if CVA/TIA r/o) Monitor platelets by anticoagulation protocol: Yes   Plan:  Will begin heparin gtt at 1400 units/hr and monitor heparin levels and CBC.  Vernard GamblesVeronda Astella Desir, PharmD, BCPS  08/17/2016,4:43 AM

## 2016-08-17 NOTE — H&P (Signed)
PULMONARY / CRITICAL CARE MEDICINE   Name: Nathan Shepherd MRN: [id number] DOB: [birth year]    ADMISSION DATE:  08/16/2016 CONSULTATION DATE:  08/17/2016  REFERRING MD:  Dr. Lynelle Doctor   CHIEF COMPLAINT:  Encephalopathy   HISTORY OF PRESENT ILLNESS:   61 year old male with PMH of PAF (on coumadin), CHF, HTN, CKD stage 3, HLD, HTN, and Seizures    On 8/14 patient was found unresponsive at group home with urinary incontinence. On arrival at [facility] ED patient was intubated for airway protection. CT Head negative for acute process. CTA with severe stenosis to V4 segment. UDS negative. Patient transferred to [facility] for further work-up. PCCM to admit.   PAST MEDICAL HISTORY :  He  has a past medical history of CHF (congestive heart failure) (HCC); CKD (chronic kidney disease) stage 3, GFR 30-59 ml/min; Edema; Hyperlipemia; Hypertension; and Seizures (HCC).  PAST SURGICAL HISTORY: He  has no past surgical history on file.  Allergies  Allergen Reactions  . Penicillins Hives    No current facility-administered medications on file prior to encounter.    Current Outpatient Prescriptions on File Prior to Encounter  Medication Sig  . aspirin EC 81 MG tablet Take 81 mg by mouth daily.  . carvedilol (COREG) 12.5 MG tablet Take 12.5 mg by mouth 2 (two) times daily with a meal.  . levETIRAcetam (KEPPRA) 500 MG tablet Take 500 mg by mouth 2 (two) times daily.  Marland Kitchen losartan (COZAAR) 50 MG tablet Take 50 mg by mouth daily. Take 2 tabs by  Mouth daily  . NIFEdipine (PROCARDIA-XL/ADALAT CC) 60 MG 24 hr tablet Take 60 mg by mouth daily. Take 2 tabs my mouth once daily  . rosuvastatin (CRESTOR) 20 MG tablet Take 20 mg by mouth daily.  Marland Kitchen torsemide (DEMADEX) 20 MG tablet Take 20 mg by mouth daily.  Marland Kitchen warfarin (COUMADIN) 7.5 MG tablet Take 7.5 mg by mouth daily.  Marland Kitchen warfarin (COUMADIN) 1 MG tablet Take 1 mg by mouth daily.    FAMILY HISTORY:  His has no family status information on file.    SOCIAL  HISTORY: He    REVIEW OF SYSTEMS:   Unable to review as patient is intubated and encephalopathic   SUBJECTIVE:    VITAL SIGNS: BP (!) 168/117 (BP Location: Left Arm)   Pulse 83   Temp 100 F (37.8 C) (Axillary)   Resp 20   Ht 6\' 4"  (1.93 m)   Wt 130.6 kg (288 lb)   SpO2 97%   BMI 35.06 kg/m   HEMODYNAMICS:    VENTILATOR SETTINGS: Vent Mode: PRVC FiO2 (%):  [40 %-100 %] 40 % Set Rate:  [16 bmp-20 bmp] 20 bmp Vt Set:  [570 mL] 570 mL PEEP:  [5 cmH20] 5 cmH20 Plateau Pressure:  [20 cmH20] 20 cmH20  INTAKE / OUTPUT: No intake/output data recorded.  PHYSICAL EXAMINATION: General:  Adult male, no distress  Neuro:  Sedated, does not follow commands, pupils intact  HEENT:  ETT in place, blood noted to subglottic tube  Cardiovascular:  Irregular, no MRG  Lungs:  Clear breath sounds, non-labored  Abdomen:  Non-distended, active bowel sounds  Musculoskeletal:  -edema  Skin:  Warm, dry, intact   LABS:  BMET  Recent Labs Lab 08/16/16 2200 08/16/16 2303  NA 136 139  K 3.9 4.4  CL 99* 99*  CO2 31  --   BUN 13 14  CREATININE 1.40* 1.40*  GLUCOSE 156* 164*    Electrolytes  Recent Labs  Lab 08/16/16 2200  CALCIUM 8.9    CBC  Recent Labs Lab 08/16/16 2200 08/16/16 2303  WBC 7.8  --   HGB 13.3 14.3  HCT 42.8 42.0  PLT 213  --     Coag's  Recent Labs Lab 08/16/16 2200  APTT 33  INR 1.79    Sepsis Markers No results for input(s): LATICACIDVEN, PROCALCITON, O2SATVEN in the last 168 hours.  ABG  Recent Labs Lab 08/16/16 2253 08/17/16 0040  PHART 7.248* 7.368  PCO2ART RBV B.CRABTREE,RN BY K.WYATT,RRT,RCP ON 08/16/16 AT 23:10. 50.8*  PO2ART 445* 148*    Liver Enzymes  Recent Labs Lab 08/16/16 2200  AST 17  ALT 13*  ALKPHOS 95  BILITOT 0.4  ALBUMIN 4.4    Cardiac Enzymes No results for input(s): TROPONINI, PROBNP in the last 168 hours.  Glucose  Recent Labs Lab 08/16/16 2158  GLUCAP 139*    Imaging Ct Angio Head W Or  Wo Contrast  Result Date: 08/17/2016 CLINICAL DATA:  Follow-up code stroke. Unresponsive. History of seizures, hypertension. EXAM: CT ANGIOGRAPHY HEAD AND NECK TECHNIQUE: Multidetector CT imaging of the head and neck was performed using the standard protocol during bolus administration of intravenous contrast. Multiplanar CT image reconstructions and MIPs were obtained to evaluate the vascular anatomy. Carotid stenosis measurements (when applicable) are obtained utilizing NASCET criteria, using the distal internal carotid diameter as the denominator. CONTRAST:  100 cc Isovue 370 COMPARISON:  CT HEAD August 16, 2016 at 2236 hours FINDINGS: CTA NECK AORTIC ARCH: Normal appearance of the thoracic arch, normal branch pattern. Mild atherosclerosis. The origins of the innominate, left Common carotid artery and subclavian artery are widely patent. RIGHT CAROTID SYSTEM: Common carotid artery is widely patent. Normal appearance of the carotid bifurcation without hemodynamically significant stenosis by NASCET criteria. Normal appearance of the included internal carotid artery, tonsillar loop. LEFT CAROTID SYSTEM: Common carotid artery is widely patent. Normal appearance of the carotid bifurcation without hemodynamically significant stenosis by NASCET criteria. Normal appearance of the included internal carotid artery. VERTEBRAL ARTERIES:Left vertebral artery is dominant. Normal appearance of the vertebral arteries, vertebral arteries are patent, mild extrinsic deformity due to degenerative cervical spine. SKELETON: No acute osseous process though bone windows have not been submitted. Severe C5-6 degenerative disc. Severe RIGHT C5-6 neural foraminal narrowing. OTHER NECK: Soft tissues of the neck are non-acute though, not tailored for evaluation. Life-support lines in place. Tree-in-bud infiltrates RIGHT upper lobe. Air debris level LEFT mainstem bronchus, incompletely imaged. CTA HEAD ANTERIOR CIRCULATION: Patent cervical  internal carotid arteries, petrous, cavernous and supra clinoid internal carotid arteries. Mild stenosis RIGHT supraclinoid internal carotid artery. Mild calcific atherosclerosis carotid siphons. Widely patent anterior communicating artery. Patent anterior and middle cerebral arteries. No large vessel occlusion, significant stenosis, contrast extravasation or aneurysm. POSTERIOR CIRCULATION: Moderate stenosis proximal RIGHT V4 segment, severely stenotic distal RIGHT V4 segment distal to the posterior-inferior cerebellar artery origin. Vertebrobasilar junction and basilar artery, as well as main branch vessels. No large vessel occlusion, contrast extravasation or aneurysm. VENOUS SINUSES: Major dural venous sinuses are patent though not tailored for evaluation on this angiographic examination. ANATOMIC VARIANTS: None. DELAYED PHASE: No abnormal intracranial enhancement. MIP images reviewed. IMPRESSION: CTA NECK: 1. Mild atherosclerosis without hemodynamically significant stenosis or acute vascular process. 2. Tree-in-bud infiltrates RIGHT upper lobe may be infectious or inflammatory. Debris within LEFT mainstem bronchus. CTA HEAD: 1. No emergent large vessel occlusion. 2. Mild intracranial atherosclerosis. Severe stenosis distal RIGHT V4 segment. Electronically Signed   By: Pernell Dupre  Bloomer M.D.   On: 08/17/2016 01:34   Ct Angio Neck W Or Wo Contrast  Result Date: 08/17/2016 CLINICAL DATA:  Follow-up code stroke. Unresponsive. History of seizures, hypertension. EXAM: CT ANGIOGRAPHY HEAD AND NECK TECHNIQUE: Multidetector CT imaging of the head and neck was performed using the standard protocol during bolus administration of intravenous contrast. Multiplanar CT image reconstructions and MIPs were obtained to evaluate the vascular anatomy. Carotid stenosis measurements (when applicable) are obtained utilizing NASCET criteria, using the distal internal carotid diameter as the denominator. CONTRAST:  100 cc Isovue  370 COMPARISON:  CT HEAD August 16, 2016 at 2236 hours FINDINGS: CTA NECK AORTIC ARCH: Normal appearance of the thoracic arch, normal branch pattern. Mild atherosclerosis. The origins of the innominate, left Common carotid artery and subclavian artery are widely patent. RIGHT CAROTID SYSTEM: Common carotid artery is widely patent. Normal appearance of the carotid bifurcation without hemodynamically significant stenosis by NASCET criteria. Normal appearance of the included internal carotid artery, tonsillar loop. LEFT CAROTID SYSTEM: Common carotid artery is widely patent. Normal appearance of the carotid bifurcation without hemodynamically significant stenosis by NASCET criteria. Normal appearance of the included internal carotid artery. VERTEBRAL ARTERIES:Left vertebral artery is dominant. Normal appearance of the vertebral arteries, vertebral arteries are patent, mild extrinsic deformity due to degenerative cervical spine. SKELETON: No acute osseous process though bone windows have not been submitted. Severe C5-6 degenerative disc. Severe RIGHT C5-6 neural foraminal narrowing. OTHER NECK: Soft tissues of the neck are non-acute though, not tailored for evaluation. Life-support lines in place. Tree-in-bud infiltrates RIGHT upper lobe. Air debris level LEFT mainstem bronchus, incompletely imaged. CTA HEAD ANTERIOR CIRCULATION: Patent cervical internal carotid arteries, petrous, cavernous and supra clinoid internal carotid arteries. Mild stenosis RIGHT supraclinoid internal carotid artery. Mild calcific atherosclerosis carotid siphons. Widely patent anterior communicating artery. Patent anterior and middle cerebral arteries. No large vessel occlusion, significant stenosis, contrast extravasation or aneurysm. POSTERIOR CIRCULATION: Moderate stenosis proximal RIGHT V4 segment, severely stenotic distal RIGHT V4 segment distal to the posterior-inferior cerebellar artery origin. Vertebrobasilar junction and basilar artery,  as well as main branch vessels. No large vessel occlusion, contrast extravasation or aneurysm. VENOUS SINUSES: Major dural venous sinuses are patent though not tailored for evaluation on this angiographic examination. ANATOMIC VARIANTS: None. DELAYED PHASE: No abnormal intracranial enhancement. MIP images reviewed. IMPRESSION: CTA NECK: 1. Mild atherosclerosis without hemodynamically significant stenosis or acute vascular process. 2. Tree-in-bud infiltrates RIGHT upper lobe may be infectious or inflammatory. Debris within LEFT mainstem bronchus. CTA HEAD: 1. No emergent large vessel occlusion. 2. Mild intracranial atherosclerosis. Severe stenosis distal RIGHT V4 segment. Electronically Signed   By: Awilda Metro M.D.   On: 08/17/2016 01:34   Dg Chest Port 1 View  Result Date: 08/16/2016 CLINICAL DATA:  Respiratory failure. EXAM: PORTABLE CHEST 1 VIEW COMPARISON:  None. FINDINGS: Endotracheal tube tip is at the level of the clavicular heads, 7 cm above the inferior margin of the carina. There is cardiomegaly with mild pulmonary vascular congestion without overt edema. No pneumothorax or sizable pleural effusion. IMPRESSION: Endotracheal tube tip at the level of the clavicular heads. Massive cardiomegaly without overt pulmonary edema. Electronically Signed   By: Deatra Robinson M.D.   On: 08/16/2016 22:52   Ct Head Code Stroke Wo Contrast`  Result Date: 08/16/2016 CLINICAL DATA:  Code stroke. EXAM: CT HEAD WITHOUT CONTRAST TECHNIQUE: Contiguous axial images were obtained from the base of the skull through the vertex without intravenous contrast. COMPARISON:  None. FINDINGS: Brain: No mass lesion  or acute hemorrhage. Hypoattenuation in the left occipital lobe, likely old left PCA infarct. There is periventricular hypoattenuation compatible with chronic microvascular disease. Old left thalamic lacunar infarct. Vascular: No hyperdense vessel. No advanced atherosclerotic calcification of the arteries at the  skull base. Skull: Normal visualized skull base, calvarium and extracranial soft tissues. Sinuses/Orbits: Extensive opacification of the paranasal sinuses, including fluid levels sphenoid sinuses. This is probably partially due to intubation. There may be superimposed element of chronic sinusitis. Normal orbits. ASPECTS [facility]([place] Stroke Program Early CT Score) - Ganglionic level infarction (caudate, lentiform nuclei, internal capsule, insula, M1-M3 cortex): 7 - Supraganglionic infarction (M4-M6 cortex): 3 Total score (0-10 with 10 being normal): 10 IMPRESSION: 1. No acute hemorrhage or mass lesion. 2. ASPECTS is 10. 3. 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 These results were called by telephone at the time of interpretation on 08/16/2016 at 10:56 pm to Dr. Donnetta HutchingBRIAN COOK , who verbally acknowledged these results. Electronically Signed   By: Deatra RobinsonKevin  Herman M.D.   On: 08/16/2016 22:57     STUDIES:  CXR 8/14 > Massive Cardiomegaly without overt pulmonary edema  CT Head 8/14 > No acute. 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 Tree-in-bud infiltrates RIGHT upper lobe may be infectious or inflammatory. Debris within LEFT mainstem bronchus. No emergent large vessel occlusion. Mild intracranial atherosclerosis. Severe stenosis distal RIGHT V4 segment.  CULTURES: Blood 8/15 >> Sputum 8/15 >>   ANTIBIOTICS: Flagyl 8/15 >> Cefepime 8/15 >>   SIGNIFICANT EVENTS: 8/14 > Presents to OSH unresponsive   LINES/TUBES: ETT 8/14 >>  DISCUSSION: 61 year old male presents to [facility] unresponsive with reported event of urinary incontinence. CT negative for acute. V4 Segment with severe stenosis. History of seizures. Intubated for airway protection and [facility]   ASSESSMENT / PLAN:  PULMONARY A: Respiratory Insufficiency in setting of encephalopathy  P:   Vent  Support No wean due to mental status  Trend CXR and ABG Pulmonary Hygiene   CARDIOVASCULAR A:  HTN PAF (on Coumadin)  H/O HLD, CHF (Unknown EF)  P:  Cardiac Monitoring  ECHO pending  Heparin GTT > hold home coumadin Maintain MAP > 90 for cerebral perfusion given V4 Stenosis (until clarified by Neurology)  Continue home coreg   RENAL A:   CKD stage 3  P:   Trend BMP Replace electrolytes as indicated   GASTROINTESTINAL A:   No issues  P:   NPO PPI   HEMATOLOGIC A:   On Chronic Anticoagulation  P:  Trend CBC  SCDs   INFECTIOUS A:   ?Aspiration Event  -CT Chest with infiltrate in right upper lobe Febrile -Tmax 100.0  P:   Trend WBC and Fever Curve  Follow culture data  Antibiotics as above  Trend LA and PCT    ENDOCRINE A:   Hyperglycemia  H/O DM    P:   Trend Glucose  SSI  NEUROLOGIC A:   Acute Encephalopathy  -CTA with severe stenosis of distal right V4 segment  H/O Seizures -UDS negative, ETOH <5 P:   RASS goal: 0 Neurology Consulted  EEG pending  MR Brain pending  Wean Propofol  Continue Keppra 500 mg BID    FAMILY  - Updates: No family present    - Inter-disciplinary family meet or Palliative Care meeting due by:  08/24/16   CC Time : 54 minutes   Leitha BleakKatalina  Hue[facility] Bienenstock [facility]  Pgr: [phone]  PCCM Pgr: [phone]   I have seen and examined the patient. I agree with Mrs. Arvil Chaco assessment and plan  Altered mental status mostly due to seizure, on keppra and EEG ordered  Acute hypoxic resp failure: on mechanical ventilation, follow up ABGs and CXR  Allow permissive hypertension because of distal Rt V4 stenosis  Send cultures and start cefepime and flagyl for possible aspiration  Oswaldo Milian, MD CCM attending

## 2016-08-17 NOTE — ED Notes (Signed)
Pt foley collection chamber emptied - of clear, yellow urine

## 2016-08-17 NOTE — Progress Notes (Signed)
Interim Summary   Patient back to baseline, eating lunch when I evaluated him. States he has history of seizures and takes medications. MRI shows no acute stroke, event was likely a seizure. EEG shows no epileptiform activity. Reviewed hom meds, on keppra 1g BID. Please continue.  Stop heparin drip and restart home warfarin. Check PT levels tomorrow.

## 2016-08-17 NOTE — Consult Note (Signed)
NEURO HOSPITALIST CONSULT NOTE   Requestig physician: Dr. Delton CoombesByrum  Reason for Consult: Acute onset of obtundation  History obtained from:  Chart     HPI:                                                                                                                                          Nathan Shepherd is an 61 y.o. male who presented to the [facility] ED on Tuesday night for evaluation of sudden-onset obtundation. The patient lives in a group home. He was found by staff at the facility incontinent of urine and drooling. Upon transport to the [facility] ED he became unresponsive, with pupils 2 mm and sluggish. CBG was 104. He had no known prodromal illnesses. Glucose was normal. CT head showed evidence for an old stroke, with no acute findings. His drug screen was negative, CXR showed no pneumonia and urinalysis was negative. Code Stroke was initiated but patient was not a tPA candidate due to increased INR. The patient was intubated at the OSH.   At baseline he is alert and oriented, able to walk.   His PMHx includes HTN, CKD, CHF, hyperlipidemia and seizures. Home medications include Keppra, coumadin and ASA. The chart does not list the indication for his Coumadin prescription, but cardiac telemetry reveals atrial fibrillation.   Past Medical History:  Diagnosis Date  . CHF (congestive heart failure) (HCC)   . CKD (chronic kidney disease) stage 3, GFR 30-59 ml/min   . Edema   . Hyperlipemia   . Hypertension   . Seizures (HCC)     No past surgical history on file.  No family history on file.  Social History:  has no tobacco, alcohol, and drug history on file.  Allergies  Allergen Reactions  . Penicillins Hives    HOME MEDICATIONS:                                                                                                                     Prior to Admission:  Prescriptions Prior to Admission  Medication Sig Dispense Refill Last Dose  . aspirin EC 81  MG tablet Take 81 mg by mouth daily.   08/16/2016 at Unknown time  . carvedilol (COREG) 12.5 MG tablet Take 12.5 mg by mouth  2 (two) times daily with a meal.   08/16/2016 at Unknown time  . levETIRAcetam (KEPPRA) 500 MG tablet Take 500 mg by mouth 2 (two) times daily.   08/16/2016 at Unknown time  . losartan (COZAAR) 50 MG tablet Take 50 mg by mouth daily. Take 2 tabs by  Mouth daily   08/16/2016 at Unknown time  . NIFEdipine (PROCARDIA-XL/ADALAT CC) 60 MG 24 hr tablet Take 60 mg by mouth daily. Take 2 tabs my mouth once daily   08/16/2016 at Unknown time  . rosuvastatin (CRESTOR) 20 MG tablet Take 20 mg by mouth daily.   08/16/2016 at Unknown time  . torsemide (DEMADEX) 20 MG tablet Take 20 mg by mouth daily.   08/16/2016 at Unknown time  . warfarin (COUMADIN) 7.5 MG tablet Take 7.5 mg by mouth daily.   08/16/2016 at Unknown time  . warfarin (COUMADIN) 1 MG tablet Take 1 mg by mouth daily.   Unknown at Unknown time   Scheduled: . carvedilol  12.5 mg Oral BID WC  . insulin aspart  2-6 Units Subcutaneous Q4H  . pantoprazole (PROTONIX) IV  40 mg Intravenous QHS  . succinylcholine       Continuous: . sodium chloride    . ceFEPime (MAXIPIME) IV    . heparin 1,400 Units/hr (08/17/16 0515)  . levETIRAcetam Stopped (08/17/16 0519)  . levETIRAcetam    . magnesium sulfate 1 - 4 g bolus IVPB    . metronidazole    . propofol 20 mcg/kg/min (08/17/16 0600)  . propofol    . sodium phosphate  Dextrose 5% IVPB     ZOX:WRUEAV chloride, hydrALAZINE   ROS:                                                                                                                                       Unable to obtain due to intubation and sedation.    Blood pressure (!) 167/117, pulse 83, temperature 99.9 F (37.7 C), resp. rate 20, height 6\' 4"  (1.93 m), weight 130.6 kg (288 lb), SpO2 100 %.   General Examination:                                                                                                       HEENT-  [place]/AT  Lungs- Intubated Extremities- Warm and well perfused  Neurological Examination Mental Status: Intubated and sedated on propofol. Semipurposeful movements to noxious stimuli. No eye opening or attempts to communicate.  Cranial Nerves: II: No blink  to threat. Pupils equal and sluggishly reactive. III,IV, VI: No doll's eye reflex (sedated on propofol).  V,VII: Face is flaccidly symmetric. Does not react to tactile stimuli VIII: No response to voice (sedated) IX,X: Intubated XI: Unable to assess XII: Intubated Motor/Sensory: Tone is normal x 4. Moves upper and lower extremities semipurposefully to noxious stimuli with no asymmetry noted.  Deep Tendon Reflexes: 1+ brachioradialis and patellae bilaterally. 0 achilles reflex bilaterally. Cerebellar/Gait: Unable to assess  Lab Results: Basic Metabolic Panel:  Recent Labs Lab 08/16/16 2200 08/16/16 2303  NA 136 139  K 3.9 4.4  CL 99* 99*  CO2 31  --   GLUCOSE 156* 164*  BUN 13 14  CREATININE 1.40* 1.40*  CALCIUM 8.9  --     Liver Function Tests:  Recent Labs Lab 08/16/16 2200  AST 17  ALT 13*  ALKPHOS 95  BILITOT 0.4  PROT 8.2*  ALBUMIN 4.4   No results for input(s): LIPASE, AMYLASE in the last 168 hours. No results for input(s): AMMONIA in the last 168 hours.  CBC:  Recent Labs Lab 08/16/16 2200 08/16/16 2303  WBC 7.8  --   NEUTROABS 4.5  --   HGB 13.3 14.3  HCT 42.8 42.0  MCV 81.8  --   PLT 213  --     Cardiac Enzymes: No results for input(s): CKTOTAL, CKMB, CKMBINDEX, TROPONINI in the last 168 hours.  Lipid Panel: No results for input(s): CHOL, TRIG, HDL, CHOLHDL, VLDL, LDLCALC in the last 168 hours.  CBG:  Recent Labs Lab 08/16/16 2158  GLUCAP 139*    Microbiology: No results found for this or any previous visit.  Coagulation Studies:  Recent Labs  08/16/16 2200  LABPROT 21.0*  INR 1.79    Imaging: Ct Angio Head W Or Wo Contrast  Result Date: 08/17/2016 CLINICAL  DATA:  Follow-up code stroke. Unresponsive. History of seizures, hypertension. EXAM: CT ANGIOGRAPHY HEAD AND NECK TECHNIQUE: Multidetector CT imaging of the head and neck was performed using the standard protocol during bolus administration of intravenous contrast. Multiplanar CT image reconstructions and MIPs were obtained to evaluate the vascular anatomy. Carotid stenosis measurements (when applicable) are obtained utilizing NASCET criteria, using the distal internal carotid diameter as the denominator. CONTRAST:  100 cc Isovue 370 COMPARISON:  CT HEAD August 16, 2016 at 2236 hours FINDINGS: CTA NECK AORTIC ARCH: Normal appearance of the thoracic arch, normal branch pattern. Mild atherosclerosis. The origins of the innominate, left Common carotid artery and subclavian artery are widely patent. RIGHT CAROTID SYSTEM: Common carotid artery is widely patent. Normal appearance of the carotid bifurcation without hemodynamically significant stenosis by NASCET criteria. Normal appearance of the included internal carotid artery, tonsillar loop. LEFT CAROTID SYSTEM: Common carotid artery is widely patent. Normal appearance of the carotid bifurcation without hemodynamically significant stenosis by NASCET criteria. Normal appearance of the included internal carotid artery. VERTEBRAL ARTERIES:Left vertebral artery is dominant. Normal appearance of the vertebral arteries, vertebral arteries are patent, mild extrinsic deformity due to degenerative cervical spine. SKELETON: No acute osseous process though bone windows have not been submitted. Severe C5-6 degenerative disc. Severe RIGHT C5-6 neural foraminal narrowing. OTHER NECK: Soft tissues of the neck are non-acute though, not tailored for evaluation. Life-support lines in place. Tree-in-bud infiltrates RIGHT upper lobe. Air debris level LEFT mainstem bronchus, incompletely imaged. CTA HEAD ANTERIOR CIRCULATION: Patent cervical internal carotid arteries, petrous, cavernous and  supra clinoid internal carotid arteries. Mild stenosis RIGHT supraclinoid internal carotid artery. Mild calcific atherosclerosis carotid siphons.  Widely patent anterior communicating artery. Patent anterior and middle cerebral arteries. No large vessel occlusion, significant stenosis, contrast extravasation or aneurysm. POSTERIOR CIRCULATION: Moderate stenosis proximal RIGHT V4 segment, severely stenotic distal RIGHT V4 segment distal to the posterior-inferior cerebellar artery origin. Vertebrobasilar junction and basilar artery, as well as main branch vessels. No large vessel occlusion, contrast extravasation or aneurysm. VENOUS SINUSES: Major dural venous sinuses are patent though not tailored for evaluation on this angiographic examination. ANATOMIC VARIANTS: None. DELAYED PHASE: No abnormal intracranial enhancement. MIP images reviewed. IMPRESSION: CTA NECK: 1. Mild atherosclerosis without hemodynamically significant stenosis or acute vascular process. 2. Tree-in-bud infiltrates RIGHT upper lobe may be infectious or inflammatory. Debris within LEFT mainstem bronchus. CTA HEAD: 1. No emergent large vessel occlusion. 2. Mild intracranial atherosclerosis. Severe stenosis distal RIGHT V4 segment. Electronically Signed   By: Awilda Metro M.D.   On: 08/17/2016 01:34   Ct Angio Neck W Or Wo Contrast  Result Date: 08/17/2016 CLINICAL DATA:  Follow-up code stroke. Unresponsive. History of seizures, hypertension. EXAM: CT ANGIOGRAPHY HEAD AND NECK TECHNIQUE: Multidetector CT imaging of the head and neck was performed using the standard protocol during bolus administration of intravenous contrast. Multiplanar CT image reconstructions and MIPs were obtained to evaluate the vascular anatomy. Carotid stenosis measurements (when applicable) are obtained utilizing NASCET criteria, using the distal internal carotid diameter as the denominator. CONTRAST:  100 cc Isovue 370 COMPARISON:  CT HEAD August 16, 2016 at 2236  hours FINDINGS: CTA NECK AORTIC ARCH: Normal appearance of the thoracic arch, normal branch pattern. Mild atherosclerosis. The origins of the innominate, left Common carotid artery and subclavian artery are widely patent. RIGHT CAROTID SYSTEM: Common carotid artery is widely patent. Normal appearance of the carotid bifurcation without hemodynamically significant stenosis by NASCET criteria. Normal appearance of the included internal carotid artery, tonsillar loop. LEFT CAROTID SYSTEM: Common carotid artery is widely patent. Normal appearance of the carotid bifurcation without hemodynamically significant stenosis by NASCET criteria. Normal appearance of the included internal carotid artery. VERTEBRAL ARTERIES:Left vertebral artery is dominant. Normal appearance of the vertebral arteries, vertebral arteries are patent, mild extrinsic deformity due to degenerative cervical spine. SKELETON: No acute osseous process though bone windows have not been submitted. Severe C5-6 degenerative disc. Severe RIGHT C5-6 neural foraminal narrowing. OTHER NECK: Soft tissues of the neck are non-acute though, not tailored for evaluation. Life-support lines in place. Tree-in-bud infiltrates RIGHT upper lobe. Air debris level LEFT mainstem bronchus, incompletely imaged. CTA HEAD ANTERIOR CIRCULATION: Patent cervical internal carotid arteries, petrous, cavernous and supra clinoid internal carotid arteries. Mild stenosis RIGHT supraclinoid internal carotid artery. Mild calcific atherosclerosis carotid siphons. Widely patent anterior communicating artery. Patent anterior and middle cerebral arteries. No large vessel occlusion, significant stenosis, contrast extravasation or aneurysm. POSTERIOR CIRCULATION: Moderate stenosis proximal RIGHT V4 segment, severely stenotic distal RIGHT V4 segment distal to the posterior-inferior cerebellar artery origin. Vertebrobasilar junction and basilar artery, as well as main branch vessels. No large vessel  occlusion, contrast extravasation or aneurysm. VENOUS SINUSES: Major dural venous sinuses are patent though not tailored for evaluation on this angiographic examination. ANATOMIC VARIANTS: None. DELAYED PHASE: No abnormal intracranial enhancement. MIP images reviewed. IMPRESSION: CTA NECK: 1. Mild atherosclerosis without hemodynamically significant stenosis or acute vascular process. 2. Tree-in-bud infiltrates RIGHT upper lobe may be infectious or inflammatory. Debris within LEFT mainstem bronchus. CTA HEAD: 1. No emergent large vessel occlusion. 2. Mild intracranial atherosclerosis. Severe stenosis distal RIGHT V4 segment. Electronically Signed   By: Michel Santee.D.  On: 08/17/2016 01:34   Dg Chest Port 1 View  Result Date: 08/16/2016 CLINICAL DATA:  Respiratory failure. EXAM: PORTABLE CHEST 1 VIEW COMPARISON:  None. FINDINGS: Endotracheal tube tip is at the level of the clavicular heads, 7 cm above the inferior margin of the carina. There is cardiomegaly with mild pulmonary vascular congestion without overt edema. No pneumothorax or sizable pleural effusion. IMPRESSION: Endotracheal tube tip at the level of the clavicular heads. Massive cardiomegaly without overt pulmonary edema. Electronically Signed   By: Deatra Robinson M.D.   On: 08/16/2016 22:52   Ct Head Code Stroke Wo Contrast`  Result Date: 08/16/2016 CLINICAL DATA:  Code stroke. EXAM: CT HEAD WITHOUT CONTRAST TECHNIQUE: Contiguous axial images were obtained from the base of the skull through the vertex without intravenous contrast. COMPARISON:  None. FINDINGS: Brain: No mass lesion or acute hemorrhage. Hypoattenuation in the left occipital lobe, likely old left PCA infarct. There is periventricular hypoattenuation compatible with chronic microvascular disease. Old left thalamic lacunar infarct. Vascular: No hyperdense vessel. No advanced atherosclerotic calcification of the arteries at the skull base. Skull: Normal visualized skull base,  calvarium and extracranial soft tissues. Sinuses/Orbits: Extensive opacification of the paranasal sinuses, including fluid levels sphenoid sinuses. This is probably partially due to intubation. There may be superimposed element of chronic sinusitis. Normal orbits. ASPECTS [facility] Stroke Program Early CT Score) - Ganglionic level infarction (caudate, lentiform nuclei, internal capsule, insula, M1-M3 cortex): 7 - Supraganglionic infarction (M4-M6 cortex): 3 Total score (0-10 with 10 being normal): 10 IMPRESSION: 1. No acute hemorrhage or mass lesion. 2. ASPECTS is 10. 3. 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 These results were called by telephone at the time of interpretation on 08/16/2016 at 10:56 pm to Dr. Donnetta Hutching , who verbally acknowledged these results. Electronically Signed   By: Deatra Robinson M.D.   On: 08/16/2016 22:57     Assessment: 61 year old male with acute onset of obtundation 1. Exam is compromised by sedation; however, no asymmetry is noted. DDx for presentation includes toxic/metabolic and infectious etiologies. The most likely etiology is felt to be an unwitnessed seizure followed by postictal state.  2. CT head reveals no acute hemorrhage or mass lesion. An old left PCA infarct and left thalamus lacunar infarct are noted.  3. CTA neck reveals mild atherosclerosis without hemodynamically significant stenosis or acute vascular process.  4. CTA head reveals no emergent large vessel occlusion. There is mild intracranial atherosclerosis. Severe stenosis distal RIGHT V4 segment is noted. 5. On Coumadin as an outpatient. Atrial fibrillation is noted on cardiac telemetry. A-fib not one of his listed diagnoses in EPIC. 6. PMHx includes HTN, CKD, CHF, hyperlipidemia and seizures.  Recommendations: 1. Continue Keppra.  2. EEG in AM.  3. MRI brain.  4. Attempt to wean off propofol.  5. Toxic/metabolic/infectious workup.  6. Continue Coumadin and track INR.  7. Contact PCP  to verify that Coumadin was prescribed for atrial fibrillation.  8. Unclear why he is on both ASA and Coumadin. Will also need to contact PCP to determine the indication for dual antiplatelet/anticoagulation therapy.   Electronically signed: Dr. Caryl Pina 08/17/2016, 3:23 AM

## 2016-08-17 NOTE — Progress Notes (Deleted)
Pharmacy Antibiotic Note  Erin FullingFrankie George Honsinger is a 61 y.o. male admitted on 08/16/2016 with AMS, concern for aspiration.  Pharmacy has been consulted for Unasyn dosing.  Plan: Unasyn 3g IV Q6H.  Height: 6\' 4"  (193 cm) Weight: 255 lb 11.7 oz (116 kg) IBW/kg (Calculated) : 86.8  Temp (24hrs), Avg:99 F (37.2 C), Min:96.8 F (36 C), Max:100 F (37.8 C)   Recent Labs Lab 08/16/16 2200 08/16/16 2303  WBC 7.8  --   CREATININE 1.40* 1.40*    Estimated Creatinine Clearance: 80.1 mL/min (A) (by C-G formula based on SCr of 1.4 mg/dL (H)).    Allergies  Allergen Reactions  . Penicillins Hives    Thank you for allowing pharmacy to be a part of this patient's care.  Vernard GamblesVeronda Marnita Poirier, PharmD, BCPS  08/17/2016 4:51 AM

## 2016-08-18 ENCOUNTER — Inpatient Hospital Stay (HOSPITAL_COMMUNITY): Payer: Medicare Other

## 2016-08-18 ENCOUNTER — Encounter (HOSPITAL_COMMUNITY): Payer: Self-pay

## 2016-08-18 LAB — GLUCOSE, CAPILLARY
GLUCOSE-CAPILLARY: 84 mg/dL (ref 65–99)
Glucose-Capillary: 95 mg/dL (ref 65–99)
Glucose-Capillary: 101 mg/dL — ABNORMAL HIGH (ref 65–99)
Glucose-Capillary: 99 mg/dL (ref 65–99)
Glucose-Capillary: 145 mg/dL — ABNORMAL HIGH (ref 65–99)

## 2016-08-18 LAB — BASIC METABOLIC PANEL
ANION GAP: 6 (ref 5–15)
BUN: 12 mg/dL (ref 6–20)
CALCIUM: 8.7 mg/dL — AB (ref 8.9–10.3)
CO2: 28 mmol/L (ref 22–32)
CREATININE: 1.7 mg/dL — AB (ref 0.61–1.24)
Chloride: 106 mmol/L (ref 101–111)
GFR, EST AFRICAN AMERICAN: 49 mL/min — AB (ref >60)
GFR, EST NON AFRICAN AMERICAN: 43 mL/min — AB (ref >60)
Glucose, Bld: 111 mg/dL — ABNORMAL HIGH (ref 65–99)
Potassium: 3.2 mmol/L — ABNORMAL LOW (ref 3.5–5.1)
Sodium: 140 mmol/L (ref 135–145)

## 2016-08-18 LAB — CBC
HEMATOCRIT: 38.3 % — AB (ref 39.0–52.0)
Hemoglobin: 12 g/dL — ABNORMAL LOW (ref 13.0–17.0)
MCH: 24.7 pg — ABNORMAL LOW (ref 26.0–34.0)
MCHC: 31.3 g/dL (ref 30.0–36.0)
MCV: 78.8 fL (ref 78.0–100.0)
PLATELETS: 169 10*3/uL (ref 150–400)
RBC: 4.86 MIL/uL (ref 4.22–5.81)
RDW: 14.7 % (ref 11.5–15.5)
WBC: 5 10*3/uL (ref 4.0–10.5)

## 2016-08-18 LAB — TSH: TSH: 1.107 u[IU]/mL (ref 0.350–4.500)

## 2016-08-18 LAB — PROTIME-INR
INR: 2.01
PROTHROMBIN TIME: 23.1 s — AB (ref 11.4–15.2)

## 2016-08-18 LAB — LIPID PANEL
CHOL/HDL RATIO: 2 ratio
CHOLESTEROL: 95 mg/dL (ref 0–200)
HDL: 48 mg/dL (ref >40)
LDL Cholesterol: 37 mg/dL (ref 0–99)
Triglycerides: 51 mg/dL (ref <150)
VLDL: 10 mg/dL (ref 0–40)

## 2016-08-18 LAB — HEMOGLOBIN A1C
Hgb A1c MFr Bld: 6.4 % — ABNORMAL HIGH (ref 4.8–5.6)
Mean Plasma Glucose: 136.98 mg/dL

## 2016-08-18 LAB — MAGNESIUM: Magnesium: 2.2 mg/dL (ref 1.7–2.4)

## 2016-08-18 LAB — VITAMIN B12: VITAMIN B 12: 409 pg/mL (ref 180–914)

## 2016-08-18 LAB — PROCALCITONIN: PROCALCITONIN: 0.17 ng/mL

## 2016-08-18 LAB — PHOSPHORUS: PHOSPHORUS: 2.8 mg/dL (ref 2.5–4.6)

## 2016-08-18 MED ORDER — NIFEDIPINE ER 60 MG PO TB24
120.0000 mg | ORAL_TABLET | Freq: Every day | ORAL | Status: DC
  Administered 2016-08-18 – 2016-08-19 (×2): 120 mg via ORAL
  Filled 2016-08-18 (×2): qty 2

## 2016-08-18 MED ORDER — LOSARTAN POTASSIUM 50 MG PO TABS
100.0000 mg | ORAL_TABLET | Freq: Every day | ORAL | Status: DC
  Administered 2016-08-18 – 2016-08-19 (×2): 100 mg via ORAL
  Filled 2016-08-18 (×2): qty 2

## 2016-08-18 MED ORDER — ROSUVASTATIN CALCIUM 20 MG PO TABS
20.0000 mg | ORAL_TABLET | Freq: Every day | ORAL | Status: DC
Start: 2016-08-18 — End: 2016-08-19
  Administered 2016-08-18: 20 mg via ORAL
  Filled 2016-08-18: qty 1

## 2016-08-18 MED ORDER — INSULIN ASPART 100 UNIT/ML ~~LOC~~ SOLN: 0.0000 [IU] | Freq: Three times a day (TID) | SUBCUTANEOUS | Status: DC

## 2016-08-18 MED ORDER — WARFARIN - PHARMACIST DOSING INPATIENT
Freq: Every day | Status: DC
  Administered 2016-08-18: 17:00:00

## 2016-08-18 MED ORDER — LEVETIRACETAM 500 MG PO TABS
1000.0000 mg | ORAL_TABLET | Freq: Two times a day (BID) | ORAL | Status: DC
  Administered 2016-08-18 – 2016-08-19 (×3): 1000 mg via ORAL
  Filled 2016-08-18 (×3): qty 2

## 2016-08-18 MED ORDER — WARFARIN SODIUM 7.5 MG PO TABS: 7.5000 mg | ORAL_TABLET | Freq: Once | ORAL | Status: DC

## 2016-08-18 MED ORDER — WARFARIN SODIUM 7.5 MG PO TABS
7.5000 mg | ORAL_TABLET | Freq: Once | ORAL | Status: AC
  Administered 2016-08-18: 7.5 mg via ORAL
  Filled 2016-08-18 (×2): qty 1

## 2016-08-18 MED ORDER — TORSEMIDE 20 MG PO TABS
20.0000 mg | ORAL_TABLET | Freq: Every day | ORAL | Status: DC
  Administered 2016-08-18 – 2016-08-19 (×2): 20 mg via ORAL
  Filled 2016-08-18 (×2): qty 1

## 2016-08-18 NOTE — Progress Notes (Signed)
PULMONARY / CRITICAL CARE MEDICINE   Name: Nathan FullingFrankie George Laufer MRN: [id number] DOB: 03/24/1955    ADMISSION DATE:  08/16/2016 CONSULTATION DATE:  08/17/2016  REFERRING MD:  Dr. Lynelle DoctorKnapp   CHIEF COMPLAINT:  Encephalopathy   HISTORY OF PRESENT ILLNESS:   61 year old male with PMH of PAF (on coumadin), CHF, HTN, CKD stage 3, HLD, HTN, and Seizures    On 8/14 patient was found unresponsive at group home with urinary incontinence. On [facility] at [facility] ED patient was intubated for airway protection. CT Head negative for acute process. CTA with severe stenosis to V4 segment. UDS negative. Patient transferred to [facility] for further work-up. PCCM to admit.   SUBJECTIVE:  Extubated No further seizure activity noted  VITAL SIGNS: BP 112/71   Pulse 72   Temp 99 F (37.2 C) (Oral)   Resp 17   Ht 6\' 4"  (1.93 m)   Wt 110 kg (242 lb 8.1 oz)   SpO2 97%   BMI 29.52 kg/m   HEMODYNAMICS:    VENTILATOR SETTINGS:    INTAKE / OUTPUT: I/O last 3 completed shifts: In: 1692.7 [I.V.:466; IV Piggyback:1226.7] Out: 2897 [Urine:2897]  PHYSICAL EXAMINATION: General:  Awake, comfortable Neuro: Follows commands, interacts normally, moves all extremities, no tremor HEENT: Oropharynx clear, pupils equal and reactive Cardiovascular: Irregular, heart rate in 70s Lungs: Clear bilaterally, decreased at both bases Abdomen: Soft, nondistended, positive bowel sounds Musculoskeletal: No significant edema Skin:  No rash  LABS:  BMET  Recent Labs Lab 08/16/16 2200 08/16/16 2303 08/17/16 0450 08/18/16 0235  NA 136 139 139 140  K 3.9 4.4 4.1 3.2*  CL 99* 99* 100* 106  CO2 31  --  29 28  BUN 13 14 9 12   CREATININE 1.40* 1.40* 1.37* 1.70*  GLUCOSE 156* 164* 112* 111*    Electrolytes  Recent Labs Lab 08/16/16 2200 08/17/16 0450 08/18/16 0235  CALCIUM 8.9 9.4 8.7*  MG  --  1.9 2.2  PHOS  --  1.6* 2.8    CBC  Recent Labs Lab 08/16/16 2200 08/16/16 2303 08/17/16 0450 08/18/16 0235   WBC 7.8  --  7.2 5.0  HGB 13.3 14.3 13.3 12.0*  HCT 42.8 42.0 42.9 38.3*  PLT 213  --  182 169    Coag's  Recent Labs Lab 08/16/16 2200 08/17/16 1903 08/18/16 0235  APTT 33 45*  --   INR 1.79  --  2.01    Sepsis Markers  Recent Labs Lab 08/17/16 0450 08/17/16 1001 08/18/16 0235  LATICACIDVEN 2.1* 1.2  --   PROCALCITON 0.11  --  0.17    ABG  Recent Labs Lab 08/16/16 2253 08/17/16 0040  PHART 7.248* 7.368  PCO2ART RBV B.CRABTREE,RN BY K.WYATT,RRT,RCP ON 08/16/16 AT 23:10. 50.8*  PO2ART 445* 148*    Liver Enzymes  Recent Labs Lab 08/16/16 2200  AST 17  ALT 13*  ALKPHOS 95  BILITOT 0.4  ALBUMIN 4.4    Cardiac Enzymes No results for input(s): TROPONINI, PROBNP in the last 168 hours.  Glucose  Recent Labs Lab 08/17/16 1130 08/17/16 1612 08/17/16 1956 08/17/16 2353 08/18/16 0351 08/18/16 0749  GLUCAP 116* 184* 137* 143* 95 101*    Imaging Mr Brain Wo Contrast  Result Date: 08/17/2016 CLINICAL DATA:  Encephalopathy EXAM: MRI HEAD WITHOUT CONTRAST TECHNIQUE: Multiplanar, multiecho pulse sequences of the brain and surrounding structures were obtained without intravenous contrast. COMPARISON:  CT/CTA head 08/17/2016 FINDINGS: Brain: The midline structures are normal. There is no focal diffusion restriction  to indicate acute infarct. There is an old left PCA infarct with associated gliosis and encephalomalacia. There are old bilateral corona radiata lacunar infarcts. There is multifocal hyperintense T2-weighted signal within the periventricular white matter, most often seen in the setting of chronic microvascular ischemia. No intraparenchymal hematoma or chronic microhemorrhage. Brain volume is normal for age without age-advanced or lobar predominant atrophy. The dura is normal and there is no extra-axial collection. Vascular: Major intracranial arterial and venous sinus flow voids are preserved. Skull and upper cervical spine: The visualized skull base,  calvarium, upper cervical spine and extracranial soft tissues are normal. Sinuses/Orbits: There are fluid levels within both sphenoid sinuses. There is complete opacification of frontal sinuses and right maxillary sinus. Moderate right ethmoid opacification. Normal orbits. IMPRESSION: 1. No acute ischemia or other acute abnormality. 2. 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 Electronically Signed   By: Deatra Robinson M.D.   On: 08/17/2016 14:35   Dg Chest Port 1 View  Result Date: 08/18/2016 CLINICAL DATA:  Shortness of breath, CHF, altered mental status. EXAM: PORTABLE CHEST 1 VIEW COMPARISON:  Chest x-ray of August 17, 2016 FINDINGS: There has been interval extubation of the trachea and esophagus. The lungs are well-expanded and clear. The heart is mildly enlarged. The pulmonary vascularity is normal. There is tortuosity of the ascending and descending thoracic aorta. The bony thorax exhibits no acute abnormality. IMPRESSION: There is no active cardiopulmonary disease. Electronically Signed   By: David  [place] M.D.   On: 08/18/2016 07:18     STUDIES:  CXR 8/14 > Massive Cardiomegaly without overt pulmonary edema  CT Head 8/14 > No acute. 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 Tree-in-bud infiltrates RIGHT upper lobe may be infectious or inflammatory. Debris within LEFT mainstem bronchus. No emergent large vessel occlusion. Mild intracranial atherosclerosis. Severe stenosis distal RIGHT V4 segment.  CULTURES: Blood 8/15 >> Sputum 8/15 >>   ANTIBIOTICS: Flagyl 8/15 >> 8/16 Cefepime 8/15 >> 8/16  SIGNIFICANT EVENTS: 8/14 > Presents to OSH unresponsive   LINES/TUBES: ETT 8/14 >> 8/15  DISCUSSION: 61 year old male presents to [facility] unresponsive with reported event of urinary  incontinence. CT negative for acute. V4 Segment with severe stenosis. History of seizures. Intubated for airway protection and transferred to [facility].   ASSESSMENT / PLAN:  PULMONARY A: Respiratory Insufficiency in setting of encephalopathy, suspect seizures No evidence aspiration pneumonia P:   Continue pulmonary hygiene Antibiotics as below  CARDIOVASCULAR A:  HTN PAF (on Coumadin)  H/O HLD, CHF (Unknown EF)  P:  Stop heparin today Restart Coumadin per pharmacy Restart home blood pressure regimen, torsemide  RENAL A:   Acute on CKD stage 3  Hypokalemia  P:   Restart home medications 8/16 Follow BMP, urine output Replace electrolytes as indicated  GASTROINTESTINAL A:   No issues  P:   Diet as ordered Stop PPI   HEMATOLOGIC A:   On Chronic Anticoagulation  P:  Coumadin per pharmacy Follow CBC  INFECTIOUS A:   ?Aspiration Event  -CT Chest with infiltrate in right upper lobe; chest x-ray 8/15 clear Febrile -Tmax 100.0  P:   Stop cefepime Flagyl in absence of clear evidence for aspiration pneumonia  ENDOCRINE A:   Hyperglycemia  H/O DM    P:   Sliding scale insulin, CBG per protocol  NEUROLOGIC A:  Acute Encephalopathy  -CTA with severe stenosis of distal right V4 segment  H/O Seizures -UDS negative, ETOH <5 P:   RASS goal: 0 Keppra twice a day, changed to by mouth on 8/16   FAMILY  - Updates: No family present 8/15 or 8/16  - Inter-disciplinary family meet or Palliative Care meeting due by:  08/24/16  Transfer to floor bed on 8/16  Levy Pupa, MD, PhD 08/18/2016, 10:04 AM [facility] [phone] or if no answer [phone]

## 2016-08-18 NOTE — Plan of Care (Signed)
Problem: Health Behavior/Discharge Planning: Goal: Compliance with prescribed medication regimen will improve Outcome: Progressing Pt states he has not missed any recent doses.   Problem: Physical Regulation: Goal: Diagnostic test results will improve Outcome: Completed/Met Date Met: 08/17/16 EEG was negative.

## 2016-08-18 NOTE — Evaluation (Signed)
Physical Therapy Evaluation Patient Details Name: Nathan Shepherd MRN: [id number] DOB: 18-Jan-1955 Today's Date: 08/18/2016   History of Present Illness  Pt is a 61 y/o male who presents to [facility] after being found unresponsive at his group home in [place]. He was transferred to [facility] for further evaluation. It is suspected that pt had a seizure and was admitted for encephalopathy and management of his respiratory status (ventillated upon admission - extubated 08/17/16).  Clinical Impression  Pt admitted with above diagnosis. Pt currently with functional limitations due to the deficits listed below (see PT Problem List). At the time of PT eval pt was pleasant and agreeable to therapy. Noted mild balance disturbances throughout mobility with 1 obvious LOB when turning around to sit in the chair. Upon vision testing, noted decreased occular movement inferiorly specifically, and with decreased movement of the R eye during testing. OT evaluation requested to address this as well as difficulty with LB dressing. Pt will benefit from skilled PT to increase their independence and safety with mobility to allow discharge to the venue listed below.       Follow Up Recommendations Home health PT;Supervision for mobility/OOB    Equipment Recommendations  None recommended by PT    Recommendations for Other Services OT consult     Precautions / Restrictions Precautions Precautions: Fall Restrictions Weight Bearing Restrictions: No      Mobility  Bed Mobility Overal bed mobility: Needs Assistance Bed Mobility: Supine to Sit     Supine to sit: Min guard     General bed mobility comments: Close guard at the shoulders during trunk elevation to full sitting position.   Transfers Overall transfer level: Needs assistance Equipment used: None Transfers: Sit to/from Stand Sit to Stand: Min assist         General transfer comment: Pt was able to power-up to full standing position with min  assist for balance support and safety. Pt declined AD.  Ambulation/Gait Ambulation/Gait assistance: Min assist Ambulation Distance (Feet): 275 Feet Assistive device: None Gait Pattern/deviations: Step-through pattern;Decreased stride length;Drifts right/left;Trunk flexed Gait velocity: Decreased Gait velocity interpretation: Below normal speed for age/gender General Gait Details: Pt able to ambulate fairly well in hall. Attempted to add in a cognitive task of naming colors. Pt could not name any colors unless he was seeing them first. 1 LOB at the end of gait training when turning around at the chair.   Stairs            Wheelchair Mobility    Modified Rankin (Stroke Patients Only)       Balance Overall balance assessment: Needs assistance Sitting-balance support: Feet supported;No upper extremity supported Sitting balance-Leahy Scale: Good     Standing balance support: No upper extremity supported Standing balance-Leahy Scale: Fair                               Pertinent Vitals/Pain Pain Assessment: No/denies pain    Home Living Family/patient expects to be discharged to:: Group home                 Additional Comments: Pt reports it is assisted living. 3-4 STE. Tub/shower. States you have to request equipment if you need it, but he feels he could get a walker or cane if needbe.     Prior Function Level of Independence: Independent               Hand Dominance  Dominant Hand: Right    Extremity/Trunk Assessment   Upper Extremity Assessment Upper Extremity Assessment: Defer to OT evaluation;RUE deficits/detail RUE Deficits / Details: Noted slight R shoulder flexion weakness however still WFL. Defer to OT for full UE assessment RUE Sensation:  (Denies N/T)    Lower Extremity Assessment Lower Extremity Assessment: LLE deficits/detail LLE Deficits / Details: Noted decreased strength (4/5) in L quads, hamstrings, and hip flexors. 5/5  strength in RLE. LLE Sensation:  (Denies N/T bilaterally)    Cervical / Trunk Assessment Cervical / Trunk Assessment: Normal (Forward head /rounded shoulders posture)  Communication   Communication: No difficulties  Cognition Arousal/Alertness: Awake/alert Behavior During Therapy: WFL for tasks assessed/performed Overall Cognitive Status: No family/caregiver present to determine baseline cognitive functioning                                        General Comments      Exercises     Assessment/Plan    PT Assessment    PT Problem List         PT Treatment Interventions      PT Goals (Current goals can be found in the Care Plan section)       Frequency Min 3X/week   Barriers to discharge        Co-evaluation               AM-PAC PT "6 Clicks" Daily Activity  Outcome Measure Difficulty turning over in bed (including adjusting bedclothes, sheets and blankets)?: None Difficulty moving from lying on back to sitting on the side of the bed? : A Little Difficulty sitting down on and standing up from a chair with arms (e.g., wheelchair, bedside commode, etc,.)?: A Little Help needed moving to and from a bed to chair (including a wheelchair)?: A Little Help needed walking in hospital room?: A Little Help needed climbing 3-5 steps with a railing? : A Little 6 Click Score: 19    End of Session Equipment Utilized During Treatment: Gait belt Activity Tolerance: Patient tolerated treatment well Patient left: in chair;with call bell/phone within reach;with chair alarm set Nurse Communication: Mobility status PT Visit Diagnosis: Unsteadiness on feet (R26.81);Other symptoms and signs involving the nervous system (R29.898)    Time: [id number] PT Time Calculation (min) (ACUTE ONLY): 25 min   Charges:   PT Evaluation $PT Eval Moderate Complexity: 1 Mod PT Treatments $Gait Training: 8-22 mins   PT G Codes:        Conni SlipperLaura Rosali Augello, PT, DPT Acute  Rehabilitation Services Pager: [phone]   Marylynn PearsonLaura D Shemeika Starzyk 08/18/2016, 1:33 PM

## 2016-08-18 NOTE — Progress Notes (Signed)
ANTICOAGULATION CONSULT NOTE - Follow Up Consult  Pharmacy Consult for warfarin Indication: atrial fibrillation  Allergies  Allergen Reactions  . Penicillins Hives    Patient Measurements: Height: 6\' 4"  (193 cm) Weight: 242 lb 8.1 oz (110 kg) IBW/kg (Calculated) : 86.8 Heparin Dosing Weight: 115 kg  Vital Signs: Temp: 99 F (37.2 C) (08/16 0800) Temp Source: Oral (08/16 0800) BP: 112/71 (08/16 0800) Pulse Rate: 72 (08/16 0800)  Labs:  Recent Labs  08/16/16 2200 08/16/16 2303 08/17/16 0450 08/17/16 1001 08/17/16 1903 08/18/16 0235  HGB 13.3 14.3 13.3  --   --  12.0*  HCT 42.8 42.0 42.9  --   --  38.3*  PLT 213  --  182  --   --  169  APTT 33  --   --   --  45*  --   LABPROT 21.0*  --   --   --   --  23.1*  INR 1.79  --   --   --   --  2.01  HEPARINUNFRC  --   --   --  0.25*  --   --   CREATININE 1.40* 1.40* 1.37*  --   --  1.70*    Estimated Creatinine Clearance: 64.4 mL/min (A) (by C-G formula based on SCr of 1.7 mg/dL (H)).  Assessment: CC/HPI:  61yo male found at group home incontinent of urine and drooling, became [facility] and code stroke called, old stroke on CT but no acute findings.  PMH: CHF, CKD, HLD, HTN, seizures, hx of afib on warfarin  Anticoag: warfarin pta for hx of afib. Admit INR 1.79 S/p hep gtt with stroke ruled out Warfarin resumed 8/15; inr now 2.01  PTA warfarin dose 7.5 mg daily; last dose 8/14  Renal: SCr 1.7   Heme/Onc: H&H 12/38.3, Plt 169  Goal of Therapy:  Heparin level 0.3 - 035 units/ml Monitor platelets by anticoagulation protocol: Yes   Plan:  Warfarin 7.5 mg x 1 Daily inr  Isaac BlissMichael Ronon Ferger, PharmD, BCPS, BCCCP Clinical Pharmacist Clinical phone for 08/18/2016 from 7a-3:30p: [phone] If after 3:30p, please call main pharmacy at: [phone] 08/18/2016 10:26 AM

## 2016-08-19 LAB — MAGNESIUM: Magnesium: 2 mg/dL (ref 1.7–2.4)

## 2016-08-19 LAB — CBC
HCT: 37.3 % — ABNORMAL LOW (ref 39.0–52.0)
Hemoglobin: 11.7 g/dL — ABNORMAL LOW (ref 13.0–17.0)
MCH: 24.7 pg — AB (ref 26.0–34.0)
MCHC: 31.4 g/dL (ref 30.0–36.0)
MCV: 78.9 fL (ref 78.0–100.0)
PLATELETS: 161 10*3/uL (ref 150–400)
RBC: 4.73 MIL/uL (ref 4.22–5.81)
RDW: 14.8 % (ref 11.5–15.5)
WBC: 4.6 10*3/uL (ref 4.0–10.5)

## 2016-08-19 LAB — PROTIME-INR
INR: 1.9
PROTHROMBIN TIME: 22.1 s — AB (ref 11.4–15.2)

## 2016-08-19 LAB — BASIC METABOLIC PANEL
Anion gap: 7 (ref 5–15)
BUN: 18 mg/dL (ref 6–20)
CALCIUM: 8.9 mg/dL (ref 8.9–10.3)
CO2: 30 mmol/L (ref 22–32)
CREATININE: 1.76 mg/dL — AB (ref 0.61–1.24)
Chloride: 104 mmol/L (ref 101–111)
GFR calc Af Amer: 47 mL/min — ABNORMAL LOW (ref >60)
GFR, EST NON AFRICAN AMERICAN: 41 mL/min — AB (ref >60)
GLUCOSE: 108 mg/dL — AB (ref 65–99)
Potassium: 3.1 mmol/L — ABNORMAL LOW (ref 3.5–5.1)
Sodium: 141 mmol/L (ref 135–145)

## 2016-08-19 LAB — GLUCOSE, CAPILLARY
Glucose-Capillary: 117 mg/dL — ABNORMAL HIGH (ref 65–99)
Glucose-Capillary: 111 mg/dL — ABNORMAL HIGH (ref 65–99)

## 2016-08-19 LAB — PROCALCITONIN: PROCALCITONIN: 0.11 ng/mL

## 2016-08-19 MED ORDER — POTASSIUM CHLORIDE CRYS ER 20 MEQ PO TBCR
40.0000 meq | EXTENDED_RELEASE_TABLET | Freq: Once | ORAL | Status: AC
  Administered 2016-08-19: 40 meq via ORAL
  Filled 2016-08-19: qty 2

## 2016-08-19 NOTE — Progress Notes (Signed)
RN spoke to Dr. Delton Coombes. Per Dr. Delton Coombes, patient to follow up with pcp who refills keppra, if that physician feels need to consult with neurologist, that physician will give referral. RN spoke to [facility], who is the patient's caregiver, she understands medications that are due tonight, f/u appts, and reason for admission.

## 2016-08-19 NOTE — Discharge Instructions (Signed)
Seizure, Adult °When you have a seizure: °· Parts of your body may move. °· How aware or awake (conscious) you are may change. °· You may shake (convulse). ° °Some people have symptoms right before a seizure happens. These symptoms may include: °· Fear. °· Worry (anxiety). °· Feeling like you are going to throw up (nausea). °· Feeling like the room is spinning (vertigo). °· Feeling like you saw or heard something before (deja vu). °· Odd tastes or smells. °· Changes in vision, such as seeing flashing lights or spots. ° °Seizures usually last from 30 seconds to 2 minutes. Usually, they are not harmful unless they last a long time. °Follow these instructions at home: °Medicines °· Take over-the-counter and prescription medicines only as told by your doctor. °· Avoid anything that may keep your medicine from working, such as alcohol. °Activity °· Do not do any activities that would be dangerous if you had another seizure, like driving or swimming. Wait until your doctor approves. °· If you live in the [place] ask your local DMV (department of motor vehicles) when you can drive. °· Rest. °Teaching others °· Teach friends and family what to do when you have a seizure. They should: °? Lay you on the ground. °? Protect your head and body. °? Loosen any tight clothing around your neck. °? Turn you on your side. °? Stay with you until you are better. °? Not hold you down. °? Not put anything in your mouth. °? Know whether or not you need emergency care. °General instructions °· Contact your doctor each time you have a seizure. °· Avoid anything that gives you seizures. °· Keep a seizure diary. Write down: °? What you think caused each seizure. °? What you remember about each seizure. °· Keep all follow-up visits as told by your doctor. This is important. °Contact a doctor if: °· You have another seizure. °· You have seizures more often. °· There is any change in what happens during your seizures. °· You continue to have  seizures with treatment. °· You have symptoms of being sick or having an infection. °Get help right away if: °· You have a seizure: °? That lasts longer than 5 minutes. °? That is different than seizures you had before. °? That makes it harder to breathe. °? After you hurt your head. °· After a seizure, you cannot speak or use a part of your body. °· After a seizure, you are confused or have a bad headache. °· You have two or more seizures in a row. °· You are having seizures more often. °· You do not wake up right after a seizure. °· You get hurt during a seizure. °In an emergency: °· These symptoms may be an emergency. Do not wait to see if the symptoms will go away. Get medical help right away. Call your local emergency services (911 in the [place] Do not drive yourself to the hospital. °This information is not intended to replace advice given to you by your health care provider. Make sure you discuss any questions you have with your health care provider. °Document Released: 06/08/2007 Document Revised: 09/02/2015 Document Reviewed: 09/02/2015 °Elsevier Interactive Patient Education © 2017 [employer] ° °

## 2016-08-19 NOTE — Progress Notes (Signed)
Discharge to: [facility] Anticipated discharge date: 08/19/16 Transportation by: Facility transportation  Report #: [phone]  CSW signing off.  Blenda Nicely [name] [phone]

## 2016-08-19 NOTE — Evaluation (Signed)
Occupational Therapy Evaluation Patient Details Name: Carmin Dibartolo MRN: [id number] DOB: 1955-11-19 Today's Date: 08/19/2016    History of Present Illness Pt is a 61 y/o male who presents to [facility] after being found unresponsive at his group home in [place]. He was transferred to [facility] for further evaluation. It is suspected that pt had a seizure and was admitted for encephalopathy and management of his respiratory status (ventillated upon admission - extubated 08/17/16).   Clinical Impression   PTA Pt independent in ADL and mobility. Pt overall supervision level for toilet transfer, peri care, and sink level grooming. Pt perseverating on Arethra Franklin's death this session. Vision assessed and it is very hard to determine what his baseline was. Pt with limited ROM on the right eye, and functionally he was able to find small grooming items in a large cluttered bin and navigate room. Pt would benefit from continued OT services in the acute setting to maximize safety and independence in ADL and would benefit from skilled OT in the home environment for safety assessment as well as visual assessment in his own environment. Next session to focus more on visual assessment.     Follow Up Recommendations  Home health OT    Equipment Recommendations  Tub/shower seat    Recommendations for Other Services       Precautions / Restrictions Precautions Precautions: Fall Restrictions Weight Bearing Restrictions: No      Mobility Bed Mobility               General bed mobility comments: Pt sitting OOB in recliner  Transfers Overall transfer level: Needs assistance Equipment used: None Transfers: Sit to/from Stand Sit to Stand: Min guard         General transfer comment: min guard for safety    Balance Overall balance assessment: Needs assistance Sitting-balance support: Feet supported;No upper extremity supported Sitting balance-Leahy Scale: Good Sitting balance -  Comments: able to don socks   Standing balance support: No upper extremity supported Standing balance-Leahy Scale: Fair                             ADL either performed or assessed with clinical judgement   ADL Overall ADL's : Needs assistance/impaired Eating/Feeding: Modified independent;Sitting   Grooming: Wash/dry hands;Wash/dry face;Oral care;Supervision/safety;Standing (also shaving) Grooming Details (indicate cue type and reason): Pt with no LOB during session Upper Body Bathing: Supervision/ safety;Standing   Lower Body Bathing: Min guard;Sit to/from stand   Upper Body Dressing : Modified independent   Lower Body Dressing: Min guard;Sit to/from stand Lower Body Dressing Details (indicate cue type and reason): able to don socks independently Toilet Transfer: Supervision/safety;Ambulation;Regular Toilet;Grab bars   Toileting- Clothing Manipulation and Hygiene: Supervision/safety;Sit to/from stand Toileting - Clothing Manipulation Details (indicate cue type and reason): hospital gown and peri care     Functional mobility during ADLs: Supervision/safety General ADL Comments: Pt able to open containers necessary for grooming (toothbrush, shaving crea, razors, toothpaste)     Vision Patient Visual Report: No change from baseline Vision Assessment?: Yes Eye Alignment: Impaired (comment) (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 Pt able to find grooming objects in bin, navigate room, and find objects around  room on command. Pt did not have glasses present to determine if there have been changes in ability to read.     Perception     Praxis      Pertinent Vitals/Pain Pain Assessment: No/denies pain     Hand Dominance Right   Extremity/Trunk Assessment Upper Extremity Assessment Upper Extremity Assessment: Overall WFL for tasks assessed   Lower Extremity Assessment Lower Extremity Assessment: Defer to PT evaluation   Cervical / Trunk Assessment Cervical / Trunk Assessment: Normal (forward head and rounded posture)   Communication Communication Communication: No difficulties   Cognition Arousal/Alertness: Awake/alert Behavior During Therapy: WFL for tasks assessed/performed Overall Cognitive Status: No family/caregiver present to determine baseline cognitive functioning                                 General Comments: Pt perseverating on Achille Rich during session   General Comments       Exercises     Shoulder Instructions      Home Living Family/patient expects to be discharged to:: Group home                                 Additional Comments: Pt reports it is assisted living. 3-4 STE. Tub/shower. States you have to request equipment if you need it, but he feels he could get a walker or cane if needbe.       Prior Functioning/Environment Level of Independence: Independent                 OT Problem List: Decreased activity tolerance;Impaired balance (sitting and/or standing);Impaired vision/perception;Decreased knowledge of use of DME or AE      OT Treatment/Interventions: Self-care/ADL training;Energy conservation;DME and/or AE instruction;Therapeutic activities;Visual/perceptual remediation/compensation;Patient/family education;Balance training    OT Goals(Current goals can be found in the care plan section) Acute Rehab OT Goals Patient Stated Goal: to get back home OT Goal Formulation: With patient Time For Goal  Achievement: 09/02/16 Potential to Achieve Goals: Good ADL Goals Pt Will Perform Grooming: with modified independence;standing Pt Will Perform Upper Body Bathing: with modified independence;sitting Pt Will Perform Lower Body Bathing: with modified independence;sit to/from stand Pt Will Transfer to Toilet: with modified independence;ambulating Pt Will Perform Toileting - Clothing Manipulation and hygiene: with modified independence;sit to/from stand  OT Frequency: Min 2X/week   Barriers to D/C:            Co-evaluation              AM-PAC PT "6 Clicks" Daily Activity     Outcome Measure Help from another person eating meals?: None Help from another person taking care of personal grooming?: None Help from another person toileting, which includes using toliet, bedpan, or urinal?: A Little Help from another person bathing (including washing, rinsing, drying)?: A Little Help from another person to put on and taking off regular upper body clothing?: None Help from another person to put on and taking off regular lower body clothing?: None 6 Click Score: 22   End of Session Equipment Utilized During Treatment: Gait belt Nurse Communication: Mobility status;Other (comment) (NT in room with Pt)  Activity Tolerance: Patient tolerated treatment well Patient left: Other (comment) (standing at sink finishing shaving with NT)  OT Visit Diagnosis: Unsteadiness  on feet (R26.81)                Time: 0630-1601 OT Time Calculation (min): 45 min Charges:  OT General Charges $OT Visit: 1 Procedure OT Evaluation $OT Eval Moderate Complexity: 1 Procedure OT Treatments $Self Care/Home Management : 23-37 mins G-Codes:     Sherryl Manges OTR/L [phone] Evern Bio Chika Cichowski 08/19/2016, 11:19 AM

## 2016-08-19 NOTE — Progress Notes (Signed)
RN discussed discharge instructions with patient including f/u with pcp, neurology, and coumadin clinic. Patient states his coumadin levels are checked at the facility he stays at. Patient's caretaker aware he has f/u appts with pcp, neurology, and to recheck coumadin. They state they will set up these appts. Aware no changed to medications. Neuro assessment unchanged, IV removed, tele removed.  Will continue to monitor until ride arrives.

## 2016-08-19 NOTE — Discharge Summary (Signed)
Physician Discharge Summary       Patient ID: Nathan Shepherd MRN: [id number] DOB/AGE: 59-Jan-1957 59 y.o.  Admit date: 08/16/2016 Discharge date: 08/19/2016  Discharge Diagnoses: Encephalopathy 2/2 Seizure Disorder  Detailed Hospital Course:  On 71/63 61 year old male with PMH of PAF (on coumadin), CHF, HTN, CKD stage 3, HLD, HTN, and Seizures  was found unresponsive at group home with urinary incontinence. On arrival at [facility] ED patient was intubated for airway protection. CT Head negative for acute process. CTA with severe stenosis to V4 segment. UDS negative. Patient transferred to [facility] for further work-up. PCCM  Admitted patient  He was successfully extubated 08/17/2016. He was transferred to the floor 8/16 and has remained seizure free. Mental status has returned to baseline.Pt has met maximum clinical benefit for inpatient hospital stay and is now deemed stable for discharge and transition to return his home health group home setting    Discharge Plan by active problems   PULMONARY A: Respiratory Insufficiency in setting of encephalopathy, suspect seizures>> Resolved No evidence aspiration pneumonia P:   Continue aggressive pulmonary hygiene Mobilize and ambulate as able   CARDIOVASCULAR A:  HTN PAF (on Coumadin)  H/O HLD, CHF (Unknown EF)  P:  Restart Coumadin >> INR 1.90 on 8/17 Follow up Monday 08/22/2016 for INR check per group home as prior to admission Restart home blood pressure regimen, torsemide  RENAL A:   Acute on CKD stage 3  Hypokalemia  P:  Follow up with PCP within 1 week  with BMET Restart home medications 8/16 Replete potassium as needed   GASTROINTESTINAL A:   No issues  P:   Heart Healthy diet    HEMATOLOGIC A:   On Chronic Anticoagulation  P:  Coumadin  With follow up 08/22/2016 for INT check. Follow up CBC when seen by PCP within 1 week  INFECTIOUS A:   Low  grade fever  P:   Received Cefepime as inpatient , CXR  8/16 clear No further antibiotic therapy needed. Tylenol prn   ENDOCRINE A:   Hyperglycemia  H/O DM    P:   Resume Blood Sugar monitoring regimen and coverage  as prior to admission  NEUROLOGIC A:   Acute Encephalopathy>> resolved  -CTA with severe stenosis of distal right V4 segment  H/O Seizures>> resolved on home medication regimen -UDS negative, ETOH <5 P:   RASS goal: 0 Resume home anti seizure regimen  And discharge to group home with follow up PCP within 1 week.    Significant Hospital tests/ studies    CXR 8/14 > Massive Cardiomegaly without overt pulmonary edema  CT Head 8/14 > No acute. 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 Tree-in-bud infiltrates RIGHT upper lobe may be infectious or inflammatory. Debris within LEFT mainstem bronchus. No emergent large vessel occlusion. Mild intracranial atherosclerosis. Severe stenosis distal RIGHT V4 segment. EEG: Normal  CULTURES: Blood 8/15 >> Sputum 8/15 >>   ANTIBIOTICS: Flagyl 8/15 >> 8/16 Cefepime 8/15 >> 8/16  Consults  Neurology  Discharge Exam: BP 124/79 (BP Location: Left Arm)   Pulse 70   Temp 99.3 F (37.4 C) (Oral)   Resp 16   Ht '6\' 4"'$  (1.93 m)   Wt 242 lb 8.1 oz (110 kg)   SpO2 98%   BMI 29.52 kg/m   Physical Exam:  General- No distress,  A&Ox3, supine in bed watching TV  ENT: No sinus tenderness, TM clear, pale nasal mucosa, no oral exudate,no post nasal drip, no LAN, atraumatic, normocephalic, decreased ROM right eye Cardiac: S1, S2, regular rate and rhythm, no murmur Chest: No wheeze/ rales/ dullness; no accessory muscle use, no nasal flaring, no sternal retractions Abd.: Soft Non-tender Ext: No clubbing cyanosis, edema Neuro:  normal strength Skin: No rashes, warm and dry Psych: baseline  mood and behavior, no seizure activity   Labs at discharge Lab  Results  Component Value Date   CREATININE 1.76 (H) 08/19/2016   BUN 18 08/19/2016   NA 141 08/19/2016   K 3.1 (L) 08/19/2016   CL 104 08/19/2016   CO2 30 08/19/2016   Lab Results  Component Value Date   WBC 4.6 08/19/2016   HGB 11.7 (L) 08/19/2016   HCT 37.3 (L) 08/19/2016   MCV 78.9 08/19/2016   PLT 161 08/19/2016   Lab Results  Component Value Date   ALT 13 (L) 08/16/2016   AST 17 08/16/2016   ALKPHOS 95 08/16/2016   BILITOT 0.4 08/16/2016   Lab Results  Component Value Date   INR 1.90 08/19/2016   INR 2.01 08/18/2016   INR 1.79 08/16/2016    Current radiology studies Mr Brain Wo Contrast  Result Date: 08/17/2016 CLINICAL DATA:  Encephalopathy EXAM: MRI HEAD WITHOUT CONTRAST TECHNIQUE: Multiplanar, multiecho pulse sequences of the brain and surrounding structures were obtained without intravenous contrast. COMPARISON:  CT/CTA head 08/17/2016 FINDINGS: Brain: The midline structures are normal. There is no focal diffusion restriction to indicate acute infarct. There is an old left PCA infarct with associated gliosis and encephalomalacia. There are old bilateral corona radiata lacunar infarcts. There is multifocal hyperintense T2-weighted signal within the periventricular white matter, most often seen in the setting of chronic microvascular ischemia. No intraparenchymal hematoma or chronic microhemorrhage. Brain volume is normal for age without age-advanced or lobar predominant atrophy. The dura is normal and there is no extra-axial collection. Vascular: Major intracranial arterial and venous sinus flow voids are preserved. Skull and upper cervical spine: The visualized skull base, calvarium, upper cervical spine and extracranial soft tissues are normal. Sinuses/Orbits: There are fluid levels within both sphenoid sinuses. There is complete opacification of frontal sinuses and right maxillary sinus. Moderate right ethmoid opacification. Normal orbits. IMPRESSION: 1. No acute ischemia  or other acute abnormality. 2. 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 Electronically Signed   By: Ulyses Jarred M.D.   On: 08/17/2016 14:35   Dg Chest Port 1 View  Result Date: 08/18/2016 CLINICAL DATA:  Shortness of breath, CHF, altered mental status. EXAM: PORTABLE CHEST 1 VIEW COMPARISON:  Chest x-ray of August 17, 2016 FINDINGS: There has been interval extubation of the trachea and esophagus. The lungs are well-expanded and clear. The heart is mildly enlarged. The pulmonary vascularity is normal. There is tortuosity of the ascending and descending thoracic aorta. The bony thorax exhibits no acute abnormality. IMPRESSION: There is no active cardiopulmonary disease. Electronically Signed   By: David  Martinique M.D.   On: 08/18/2016 07:18    Disposition:  Final discharge disposition not confirmed  Discharge Instructions    Call MD for:  difficulty breathing, headache or visual disturbances    Complete by:  As directed    Call MD for:  extreme fatigue    Complete by:  As directed    Call MD for:  persistant dizziness or light-headedness    Complete by:  As  directed    Call MD for:  persistant nausea and vomiting    Complete by:  As directed    Call MD for:  redness, tenderness, or signs of infection (pain, swelling, redness, odor or green/yellow discharge around incision site)    Complete by:  As directed    Call MD for:  severe uncontrolled pain    Complete by:  As directed    Call MD for:  temperature >100.4    Complete by:  As directed    Diet - low sodium heart healthy    Complete by:  As directed    Discharge instructions    Complete by:  As directed    Follow up with PCP within 1 week of discharge Follow up with Coumadin management within 1 week of discharge Follow up with Neurology within 2 weeks of discharge   Increase activity slowly    Complete by:  As directed      Allergies as of 08/19/2016        Reactions   Penicillins Hives      Medication List    TAKE these medications   aspirin EC 81 MG tablet Take 81 mg by mouth daily.   carvedilol 12.5 MG tablet Commonly known as:  COREG Take 12.5 mg by mouth 2 (two) times daily with a meal.   eucerin cream Apply 1 application topically See admin instructions. Apply to legs and feet once daily   levETIRAcetam 500 MG tablet Commonly known as:  KEPPRA Take 1,000 mg by mouth 2 (two) times daily.   losartan 50 MG tablet Commonly known as:  COZAAR Take 100 mg by mouth daily.   NIFEdipine 60 MG 24 hr tablet Commonly known as:  PROCARDIA-XL/ADALAT CC Take 120 mg by mouth daily.   rosuvastatin 20 MG tablet Commonly known as:  CRESTOR Take 20 mg by mouth at bedtime.   torsemide 20 MG tablet Commonly known as:  DEMADEX Take 20 mg by mouth daily.   warfarin 7.5 MG tablet Commonly known as:  COUMADIN Take 7.5 mg by mouth daily.            Durable Medical Equipment        Start     Ordered   08/19/16 1204  DME tub bench  Once     08/19/16 1213       Discharged Condition: good condition, hemodynamically stable, without seizures  Physician Statement:   The Patient was personally examined, the discharge assessment and plan has been personally reviewed and I agree with ACNP Groce's assessment and plan. > 30 minutes of time have been dedicated to discharge assessment, planning and discharge instructions.   Signed: Magdalen Spatz 08/19/2016, 12:55 PM

## 2016-08-19 NOTE — Progress Notes (Signed)
Patient escorted to car by nurse tech via wheelchair. Neuro assessment unchanged. Discharge instructions reviewed with another caretaker.

## 2016-08-19 NOTE — Care Management Note (Signed)
Case Management Note  Patient Details  Name: Nathan Shepherd MRN: [id number] Date of Birth: 02/16/1955  Subjective/Objective:                 Spoke w patient at the bedside. He decline tub bench for home use. Patient does not have a qualifying diagnosis for [facility] PT OT through Medicaid. Out of pocket cost are ~$150 per session. Patient declined out of pocket HH.    Action/Plan:  No other CM needs. Patient will return to Group Home today, they will provide transportation home.  Expected Discharge Date:  08/19/16               Expected Discharge Plan:  Home/Self Care  In-House Referral:  Clinical Social Work  Discharge planning Services  CM Consult  Post Acute Care Choice:    Choice offered to:     DME Arranged:    DME Agency:     HH Arranged:    HH Agency:     Status of Service:  Completed, signed off  If discussed at [employer], dates discussed:    Additional Comments:  Lawerance Sabal, RN 08/19/2016, 1:07 PM

## 2016-08-19 NOTE — NC FL2 (Signed)
[place] MEDICAID FL2 LEVEL OF CARE SCREENING TOOL     IDENTIFICATION  Patient Name: Nathan Shepherd Birthdate: 1955/02/04 Sex: male Admission Date (Current Location): 08/16/2016  [facility] and [place] Number:  Chiropodist and Address:  [facility], [address], [place], [place] [address]      Provider Number: [id number]  Attending Physician Name and Address:  Leslye Peer, MD  Relative Name and Phone Number:       Current Level of Care: Hospital Recommended Level of Care: Other (assisted living facility) Prior Approval Number:    Date Approved/Denied:   PASRR Number:    Discharge Plan: Other (assisted living facility)    Current Diagnoses: Patient Active Problem List   Diagnosis Date Noted  . Altered mental state 08/17/2016  . Edema     Orientation RESPIRATION BLADDER Height & Weight     Self, Time, Situation, Place  Normal Continent Weight: 242 lb 8.1 oz (110 kg) Height:  6\' 4"  (193 cm)  BEHAVIORAL SYMPTOMS/MOOD NEUROLOGICAL BOWEL NUTRITION STATUS    Convulsions/Seizures Continent    AMBULATORY STATUS COMMUNICATION OF NEEDS Skin   Limited Assist Verbally Normal                       Personal Care Assistance Level of Assistance  Bathing, Dressing Bathing Assistance: Limited assistance   Dressing Assistance: Limited assistance     Functional Limitations Info             SPECIAL CARE FACTORS FREQUENCY  PT (By licensed PT), OT (By licensed OT)     PT Frequency: 3x/wk with home health OT Frequency: 3x/wk with home health            Contractures      Additional Factors Info  Code Status, Allergies, Insulin Sliding Scale Code Status Info: full Allergies Info: Penicillins   Insulin Sliding Scale Info: 3x/day       Current Medications (08/19/2016):  This is the current hospital active medication list Current Facility-Administered Medications  Medication Dose Route Frequency Provider Last Rate  Last Dose  . 0.9 %  sodium chloride infusion  250 mL Intravenous PRN Tobey Grim, NP      . carvedilol (COREG) tablet 12.5 mg  12.5 mg Oral BID WC Tobey Grim, NP   12.5 mg at 08/19/16 0856  . hydrALAZINE (APRESOLINE) injection 10 mg  10 mg Intravenous Q6H PRN Tobey Grim, NP      . insulin aspart (novoLOG) injection 0-15 Units  0-15 Units Subcutaneous TID WC Leslye Peer, MD      . levETIRAcetam (KEPPRA) tablet 1,000 mg  1,000 mg Oral BID Leslye Peer, MD   1,000 mg at 08/19/16 0855  . losartan (COZAAR) tablet 100 mg  100 mg Oral Daily Leslye Peer, MD   100 mg at 08/19/16 0856  . NIFEdipine (PROCARDIA-XL/ADALAT CC) 24 hr tablet 120 mg  120 mg Oral Daily Leslye Peer, MD   120 mg at 08/19/16 0856  . rosuvastatin (CRESTOR) tablet 20 mg  20 mg Oral QHS Leslye Peer, MD   20 mg at 08/18/16 2117  . torsemide (DEMADEX) tablet 20 mg  20 mg Oral Daily Leslye Peer, MD   20 mg at 08/19/16 0856  . Warfarin - Pharmacist Dosing Inpatient   Does not apply [id number] Leslye Peer, MD         Discharge Medications: Please  see discharge summary for a list of discharge medications.  Relevant Imaging Results:  Relevant Lab Results:   Additional Information SS#: [id number]  Baldemar Lenis, [name]

## 2016-08-22 LAB — CULTURE, BLOOD (ROUTINE X 2)
Culture: NO GROWTH
Culture: NO GROWTH
Special Requests: ADEQUATE
Special Requests: ADEQUATE

## 2016-08-22 MED FILL — Medication: Qty: 1 | Status: AC

## 2016-12-15 ENCOUNTER — Emergency Department (HOSPITAL_COMMUNITY): Payer: Medicare Other

## 2016-12-15 ENCOUNTER — Emergency Department (HOSPITAL_COMMUNITY)
Admission: EM | Admit: 2016-12-15 | Discharge: 2016-12-15 | Payer: Medicare Other | Attending: Emergency Medicine | Admitting: Emergency Medicine

## 2016-12-15 ENCOUNTER — Encounter (HOSPITAL_COMMUNITY): Payer: Self-pay

## 2016-12-15 DIAGNOSIS — Z7982 Long term (current) use of aspirin: Secondary | ICD-10-CM | POA: Insufficient documentation

## 2016-12-15 DIAGNOSIS — J398 Other specified diseases of upper respiratory tract: Secondary | ICD-10-CM | POA: Diagnosis not present

## 2016-12-15 DIAGNOSIS — N183 Chronic kidney disease, stage 3 (moderate): Secondary | ICD-10-CM | POA: Insufficient documentation

## 2016-12-15 DIAGNOSIS — Z79899 Other long term (current) drug therapy: Secondary | ICD-10-CM | POA: Diagnosis not present

## 2016-12-15 DIAGNOSIS — R0902 Hypoxemia: Secondary | ICD-10-CM | POA: Insufficient documentation

## 2016-12-15 DIAGNOSIS — J9601 Acute respiratory failure with hypoxia: Secondary | ICD-10-CM | POA: Diagnosis not present

## 2016-12-15 DIAGNOSIS — I509 Heart failure, unspecified: Secondary | ICD-10-CM | POA: Diagnosis not present

## 2016-12-15 DIAGNOSIS — I4821 Permanent atrial fibrillation: Secondary | ICD-10-CM

## 2016-12-15 DIAGNOSIS — I130 Hypertensive heart and chronic kidney disease with heart failure and stage 1 through stage 4 chronic kidney disease, or unspecified chronic kidney disease: Secondary | ICD-10-CM | POA: Insufficient documentation

## 2016-12-15 DIAGNOSIS — Z880 Allergy status to penicillin: Secondary | ICD-10-CM | POA: Insufficient documentation

## 2016-12-15 DIAGNOSIS — R569 Unspecified convulsions: Secondary | ICD-10-CM

## 2016-12-15 DIAGNOSIS — N390 Urinary tract infection, site not specified: Secondary | ICD-10-CM | POA: Diagnosis not present

## 2016-12-15 DIAGNOSIS — Z7901 Long term (current) use of anticoagulants: Secondary | ICD-10-CM | POA: Insufficient documentation

## 2016-12-15 DIAGNOSIS — I482 Chronic atrial fibrillation: Secondary | ICD-10-CM | POA: Diagnosis not present

## 2016-12-15 DIAGNOSIS — J988 Other specified respiratory disorders: Secondary | ICD-10-CM | POA: Insufficient documentation

## 2016-12-15 LAB — URINALYSIS, ROUTINE W REFLEX MICROSCOPIC
Bilirubin Urine: NEGATIVE
Glucose, UA: 150 mg/dL — AB
Hgb urine dipstick: NEGATIVE
KETONES UR: NEGATIVE mg/dL
LEUKOCYTES UA: NEGATIVE
Nitrite: NEGATIVE
SQUAMOUS EPITHELIAL / LPF: NONE SEEN
Specific Gravity, Urine: 1.016 (ref 1.005–1.030)
pH: 7 (ref 5.0–8.0)

## 2016-12-15 LAB — CBC WITH DIFFERENTIAL/PLATELET
BASOS PCT: 1 %
Basophils Absolute: 0.1 10*3/uL (ref 0.0–0.1)
Eosinophils Absolute: 0.1 10*3/uL (ref 0.0–0.7)
Eosinophils Relative: 1 %
HEMATOCRIT: 48.4 % (ref 39.0–52.0)
Hemoglobin: 14.5 g/dL (ref 13.0–17.0)
LYMPHS PCT: 23 %
Lymphs Abs: 2.3 10*3/uL (ref 0.7–4.0)
MCH: 24.8 pg — ABNORMAL LOW (ref 26.0–34.0)
MCHC: 30 g/dL (ref 30.0–36.0)
MCV: 82.7 fL (ref 78.0–100.0)
MONO ABS: 0.4 10*3/uL (ref 0.1–1.0)
MONOS PCT: 4 %
NEUTROS ABS: 7.2 10*3/uL (ref 1.7–7.7)
Neutrophils Relative %: 71 %
Platelets: 216 10*3/uL (ref 150–400)
RBC: 5.85 MIL/uL — ABNORMAL HIGH (ref 4.22–5.81)
RDW: 14.8 % (ref 11.5–15.5)
WBC: 10 10*3/uL (ref 4.0–10.5)

## 2016-12-15 LAB — COMPREHENSIVE METABOLIC PANEL
ALBUMIN: 4.5 g/dL (ref 3.5–5.0)
ALT: 19 U/L (ref 17–63)
ANION GAP: 7 (ref 5–15)
AST: 21 U/L (ref 15–41)
Alkaline Phosphatase: 129 U/L — ABNORMAL HIGH (ref 38–126)
BUN: 12 mg/dL (ref 6–20)
CALCIUM: 8.9 mg/dL (ref 8.9–10.3)
CO2: 31 mmol/L (ref 22–32)
Chloride: 100 mmol/L — ABNORMAL LOW (ref 101–111)
Creatinine, Ser: 1.46 mg/dL — ABNORMAL HIGH (ref 0.61–1.24)
GFR calc Af Amer: 59 mL/min — ABNORMAL LOW (ref >60)
GFR calc non Af Amer: 51 mL/min — ABNORMAL LOW (ref >60)
GLUCOSE: 157 mg/dL — AB (ref 65–99)
Potassium: 4.1 mmol/L (ref 3.5–5.1)
SODIUM: 138 mmol/L (ref 135–145)
Total Bilirubin: 0.6 mg/dL (ref 0.3–1.2)
Total Protein: 8.4 g/dL — ABNORMAL HIGH (ref 6.5–8.1)

## 2016-12-15 LAB — BLOOD GAS, ARTERIAL
Acid-Base Excess: 4.9 mmol/L — ABNORMAL HIGH (ref 0.0–2.0)
Bicarbonate: 28.4 mmol/L — ABNORMAL HIGH (ref 20.0–28.0)
Drawn by: 23534
FIO2: 0.1
MECHVT: 690 mL
O2 Content: 100 L/min
O2 Saturation: 99.7 %
PEEP: 5 cmH2O
PH ART: 7.425 (ref 7.350–7.450)
RATE: 14 resp/min
pCO2 arterial: 45.2 mmHg (ref 32.0–48.0)
pO2, Arterial: 462 mmHg — ABNORMAL HIGH (ref 83.0–108.0)

## 2016-12-15 LAB — PROTIME-INR
INR: 1.67
Prothrombin Time: 19.6 seconds — ABNORMAL HIGH (ref 11.4–15.2)

## 2016-12-15 LAB — I-STAT CG4 LACTIC ACID, ED: LACTIC ACID, VENOUS: 0.56 mmol/L (ref 0.5–1.9)

## 2016-12-15 MED ORDER — ETOMIDATE 2 MG/ML IV SOLN
INTRAVENOUS | Status: AC | PRN
  Administered 2016-12-15: 20 mg via INTRAVENOUS

## 2016-12-15 MED ORDER — LORAZEPAM 2 MG/ML IJ SOLN
INTRAMUSCULAR | Status: AC
  Filled 2016-12-15: qty 1

## 2016-12-15 MED ORDER — FENTANYL CITRATE (PF) 100 MCG/2ML IJ SOLN
INTRAMUSCULAR | Status: AC
  Administered 2016-12-15: 100 ug via INTRAVENOUS
  Filled 2016-12-15: qty 2

## 2016-12-15 MED ORDER — LORAZEPAM 2 MG/ML IJ SOLN
2.0000 mg | Freq: Once | INTRAMUSCULAR | Status: AC
  Administered 2016-12-15: 2 mg via INTRAVENOUS

## 2016-12-15 MED ORDER — SODIUM CHLORIDE 0.9 % IV BOLUS (SEPSIS)
500.0000 mL | Freq: Once | INTRAVENOUS | Status: AC
  Administered 2016-12-15: 500 mL via INTRAVENOUS

## 2016-12-15 MED ORDER — PROPOFOL 10 MG/ML IV BOLUS
75.0000 mg | Freq: Once | INTRAVENOUS | Status: AC
  Administered 2016-12-15: 75 mg via INTRAVENOUS

## 2016-12-15 MED ORDER — HEPARIN BOLUS VIA INFUSION
2000.0000 [IU] | Freq: Once | INTRAVENOUS | Status: AC
  Administered 2016-12-15: 2000 [IU] via INTRAVENOUS

## 2016-12-15 MED ORDER — PROPOFOL 1000 MG/100ML IV EMUL
5.0000 ug/kg/min | Freq: Once | INTRAVENOUS | Status: AC
  Administered 2016-12-15: 5 ug/kg/min via INTRAVENOUS
  Filled 2016-12-15: qty 100

## 2016-12-15 MED ORDER — LEVETIRACETAM IN NACL 1000 MG/100ML IV SOLN
1000.0000 mg | Freq: Once | INTRAVENOUS | Status: AC
  Administered 2016-12-15: 1000 mg via INTRAVENOUS
  Filled 2016-12-15: qty 100

## 2016-12-15 MED ORDER — FENTANYL CITRATE (PF) 100 MCG/2ML IJ SOLN
100.0000 ug | Freq: Once | INTRAMUSCULAR | Status: AC
  Administered 2016-12-15: 100 ug via INTRAVENOUS

## 2016-12-15 MED ORDER — SUCCINYLCHOLINE CHLORIDE 20 MG/ML IJ SOLN
INTRAMUSCULAR | Status: AC | PRN
  Administered 2016-12-15: 150 mg via INTRAVENOUS

## 2016-12-15 MED ORDER — FENTANYL CITRATE (PF) 100 MCG/2ML IJ SOLN
INTRAMUSCULAR | Status: AC
  Filled 2016-12-15: qty 2

## 2016-12-15 MED ORDER — CEFTRIAXONE SODIUM 1 G IJ SOLR
1.0000 g | Freq: Once | INTRAMUSCULAR | Status: AC
  Administered 2016-12-15: 1 g via INTRAVENOUS
  Filled 2016-12-15: qty 10

## 2016-12-15 MED ORDER — HEPARIN (PORCINE) IN NACL 100-0.45 UNIT/ML-% IJ SOLN
1500.0000 [IU]/h | INTRAMUSCULAR | Status: DC
Start: 2016-12-15 — End: 2016-12-16
  Administered 2016-12-15: 1500 [IU]/h via INTRAVENOUS
  Filled 2016-12-15: qty 250

## 2016-12-15 MED ORDER — PROPOFOL 1000 MG/100ML IV EMUL
INTRAVENOUS | Status: AC
  Filled 2016-12-15: qty 100

## 2016-12-15 NOTE — ED Notes (Signed)
[facility].

## 2016-12-15 NOTE — ED Notes (Signed)
X-ray at bedside

## 2016-12-15 NOTE — Progress Notes (Signed)
ANTICOAGULATION CONSULT NOTE - Initial Consult  Pharmacy Consult for Heparin Indication: atrial fibrillation  Allergies  Allergen Reactions  . Penicillins Hives    Patient Measurements: Height: 6\' 4"  (193 cm) Weight: 288 lb (130.6 kg) IBW/kg (Calculated) : 86.8 HEPARIN DW (KG): 115.1   Vital Signs: Temp: 100.2 F (37.9 C) (12/13 1915) Temp Source: Core (12/13 1514) BP: 131/108 (12/13 1915) Pulse Rate: 65 (12/13 1915)  Labs: Recent Labs    12/15/16 1345 12/15/16 1601  HGB 14.5  --   HCT 48.4  --   PLT 216  --   LABPROT  --  19.6*  INR  --  1.67  CREATININE 1.46*  --     Estimated Creatinine Clearance: 80.4 mL/min (A) (by C-G formula based on SCr of 1.46 mg/dL (H)).   Medical History: Past Medical History:  Diagnosis Date  . CHF (congestive heart failure) (HCC)   . CKD (chronic kidney disease) stage 3, GFR 30-59 ml/min (HCC)   . Edema   . Hyperlipemia   . Hypertension   . Seizures (HCC)    Home Medications                      Prior to Admission medications   Medication Sig Start Date End Date Taking? Authorizing Provider  aspirin EC 81 MG tablet Take 81 mg by mouth daily.   Yes [provider]  carvedilol (COREG) 12.5 MG tablet Take 12.5 mg by mouth 2 (two) times daily with a meal.   Yes [provider]  donepezil (ARICEPT) 5 MG tablet Take 5 mg by mouth at bedtime.   Yes [provider]  levETIRAcetam (KEPPRA) 500 MG tablet Take 1,000 mg by mouth 2 (two) times daily.    Yes [provider]  losartan (COZAAR) 50 MG tablet Take 100 mg by mouth daily.    Yes [provider]  NIFEdipine (PROCARDIA-XL/ADALAT CC) 60 MG 24 hr tablet Take 120 mg by mouth daily.    Yes [provider]  rosuvastatin (CRESTOR) 20 MG tablet Take 20 mg by mouth at bedtime.    Yes [provider]  Skin Protectants, Misc. (EUCERIN) cream Apply 1 application topically See admin instructions. Apply to legs and  feet once daily   Yes [provider]  torsemide (DEMADEX) 20 MG tablet Take 20 mg by mouth daily.   Yes [provider]  warfarin (COUMADIN) 7.5 MG tablet Take 7.5 mg by mouth daily.   Yes [provider]   Assessment: He presents by EMS for altered mental status with suspected seizure as cause.  On warfarin PTA for Afib.  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 Patient requires reversible anticoagulation since he may need a surgical procedure for the airway, mass, shortly.  Heparin per pharmacy protocol ordered.  Warfarin PTA, INR below goal.  Warfarin on hold for potential surgical intervention.  Goal of Therapy:  Heparin level 0.3-0.7 units/ml Monitor platelets by anticoagulation protocol: Yes   Plan:  Give 2000 units bolus x 1 Start heparin infusion at 1500 units/hr Check anti-Xa level in 6-8 hours and daily while on heparin Continue to monitor H&H and platelets  Mady GemmaHayes, Kynsli Haapala R 12/15/2016,7:27 PM

## 2016-12-15 NOTE — ED Provider Notes (Signed)
[facility] EMERGENCY DEPARTMENT Provider Note   CSN: [id number] Arrival date & time: 12/15/16  1300     History   Chief Complaint Chief Complaint  Patient presents with  . Seizures    HPI Nathan Shepherd is a 61 y.o. male.  He presents by EMS for altered mental status with suspected seizure as cause.  He lives in a group home.  No seizure activity was witnessed according to EMS who transferred him here.  During transport he was hypertensive.  He is unable to give any history.  Level 5 caveat-altered mental status  HPI  Past Medical History:  Diagnosis Date  . CHF (congestive heart failure) (HCC)   . CKD (chronic kidney disease) stage 3, GFR 30-59 ml/min (HCC)   . Edema   . Hyperlipemia   . Hypertension   . Seizures [facility](HCC)     Patient Active Problem List   Diagnosis Date Noted  . Altered mental state 08/17/2016  . Edema     History reviewed. No pertinent surgical history.     Home Medications    Prior to Admission medications   Medication Sig Start Date End Date Taking? Authorizing Provider  aspirin EC 81 MG tablet Take 81 mg by mouth daily.   Yes [provider]  carvedilol (COREG) 12.5 MG tablet Take 12.5 mg by mouth 2 (two) times daily with a meal.   Yes [provider]  donepezil (ARICEPT) 5 MG tablet Take 5 mg by mouth at bedtime.   Yes [provider]  levETIRAcetam (KEPPRA) 500 MG tablet Take 1,000 mg by mouth 2 (two) times daily.    Yes [provider]  losartan (COZAAR) 50 MG tablet Take 100 mg by mouth daily.    Yes [provider]  NIFEdipine (PROCARDIA-XL/ADALAT CC) 60 MG 24 hr tablet Take 120 mg by mouth daily.    Yes [provider]  rosuvastatin (CRESTOR) 20 MG tablet Take 20 mg by mouth at bedtime.    Yes [provider]  Skin Protectants, Misc. (EUCERIN) cream Apply 1 application topically See admin instructions. Apply to legs and feet once daily   Yes [provider]  torsemide (DEMADEX) 20 MG tablet Take 20 mg by mouth daily.   Yes [provider]  warfarin (COUMADIN) 7.5 MG tablet Take 7.5 mg by mouth daily.   Yes [provider]    Family History No family history on file.  Social History Social History   Tobacco Use  . Smoking status: Never Smoker  . Smokeless tobacco: Never Used  Substance Use Topics  . Alcohol use: Not on file  . Drug use: Not on file     Allergies   Penicillins   Review of Systems Review of Systems  Unable to perform ROS: Mental status change     Physical Exam Updated Vital Signs BP 112/85   Pulse 62   Temp 100.2 F (37.9 C)   Resp 14   Ht 6\' 4"  (1.93 m)   Wt 130.6 kg (288 lb)   SpO2 100%   BMI 35.06 kg/m   Physical Exam  Constitutional: He appears well-developed and well-nourished.  Lying supine, and cardiac monitor  HENT:  Head: Normocephalic and atraumatic.  Right Ear: External ear normal.  Left Ear: External ear normal.  Eyes: Conjunctivae are normal. Pupils are equal, round, and reactive to light.  Pupils 2-3 mm reactive bilaterally  Neck: Normal range of motion and phonation normal. Neck supple.  Cardiovascular:  Normal rate and regular rhythm.  Pulmonary/Chest: Effort normal and breath sounds normal. He exhibits no bony tenderness.  Tachypneic.  Abdominal: Soft. There is no tenderness.  Musculoskeletal: Normal range of motion.  Neurological: He is unresponsive. He exhibits abnormal muscle tone (Flaccid arms bilaterally). GCS eye subscore is 4. GCS verbal subscore is 1. GCS motor subscore is 5.  Eyes open, disconjugate gaze, primarily left-sided.  Right eye moves to midline, not past spontaneously.  Left eye moves from midline to lateral position spontaneously.  Nonverbal.  Skin: Skin is warm, dry and intact. No rash noted.  Psychiatric: His behavior is normal.  Nursing note and vitals reviewed.    ED Treatments / Results  Labs (all labs ordered are listed, but  only abnormal results are displayed) Labs Reviewed  COMPREHENSIVE METABOLIC PANEL - Abnormal; Notable for the following components:      Result Value   Chloride 100 (*)    Glucose, Bld 157 (*)    Creatinine, Ser 1.46 (*)    Total Protein 8.4 (*)    Alkaline Phosphatase 129 (*)    GFR calc non Af Amer 51 (*)    GFR calc Af Amer 59 (*)    All other components within normal limits  CBC WITH DIFFERENTIAL/PLATELET - Abnormal; Notable for the following components:   RBC 5.85 (*)    MCH 24.8 (*)    All other components within normal limits  URINALYSIS, ROUTINE W REFLEX MICROSCOPIC - Abnormal; Notable for the following components:   APPearance HAZY (*)    Glucose, UA 150 (*)    Protein, ur >=300 (*)    Bacteria, UA RARE (*)    All other components within normal limits  PROTIME-INR - Abnormal; Notable for the following components:   Prothrombin Time 19.6 (*)    All other components within normal limits  BLOOD GAS, ARTERIAL - Abnormal; Notable for the following components:   pO2, Arterial 462 (*)    Bicarbonate 28.4 (*)    Acid-Base Excess 4.9 (*)    All other components within normal limits  CULTURE, BLOOD (ROUTINE X 2)  CULTURE, BLOOD (ROUTINE X 2)  URINE CULTURE  HEPARIN LEVEL (UNFRACTIONATED)  CBC  I-STAT CG4 LACTIC ACID, ED    EKG  EKG Interpretation  Date/Time:  Thursday December 15 2016 13:05:04 EST Ventricular Rate:  62 PR Interval:    QRS Duration: 106 QT Interval:  403 QTC Calculation: 410 R Axis:   66 Text Interpretation:  Atrial fibrillation Repol abnrm suggests ischemia, lateral leads since last tracing no significant change Confirmed by Mancel Bale [phone]) on 12/15/2016 6:30:29 PM       Radiology Dg Chest Port 1 View  Result Date: 12/15/2016 CLINICAL DATA:  Hypoxia.  Tachypneic. EXAM: PORTABLE CHEST 1 VIEW COMPARISON:  08/18/2016. FINDINGS: Endotracheal tube noted at the thoracic inlet 8.6 cm above the carina. NG tube noted with tip below left  hemidiaphragm. Cardiomegaly with mild pulmonary vascular prominence. No focal infiltrate. No pleural effusion or pneumothorax. IMPRESSION: 1. Endotracheal tube noted at the thoracic inlet 8.6 cm above the carina . NG tube noted with tip below left hemidiaphragm. 2. Cardiomegaly. Mild pulmonary vascular prominence. No evidence of overt pulmonary edema. No focal pulmonary infiltrate . Electronically Signed   By: Maisie Fus  Register   On: 12/15/2016 14:12    Procedures Procedure Name: Intubation Date/Time: 12/15/2016 1:51 PM Performed by: Mancel Bale, MD Pre-anesthesia Checklist: Patient identified Oxygen Delivery Method: Non-rebreather mask Preoxygenation: Pre-oxygenation with 100% oxygen Induction Type:  Rapid sequence Ventilation: Mask ventilation without difficulty Laryngoscope Size: 4 Grade View: Grade IV Number of attempts: 2 Airway Equipment and Method: Video-laryngoscopy Placement Confirmation: ETT inserted through vocal cords under direct vision,  CO2 detector and Breath sounds checked- equal and bilateral Secured at: 24 cm Tube secured with: ETT holder Dental Injury: Bloody posterior oropharynx  Difficulty Due To: Difficulty was unanticipated Comments: Initial tube placement, difficulty moving the tip of the tube through the trachea because of resistance, cuff inflated and bagging begun with good color change on CO2 monitor, and fogging of the tube.  Oxygen saturations in the 70s, were slow to increase.  Therefore the ET tube was changed to a second device, in case of possible tube malfunction.  Again intubated with some difficulty passing the tip of the tube through the trachea, so the tube was advanced, fully, in case there was a tracheal obstruction.  The tube was then withdrawn to 24 cm, cuff inflated, and bagging begun.  Again good fogging of the tube and CO2 detector was positive for appropriate placement.  Breath sounds equal bilaterally no breath sounds over stomach.  Saturations  quickly improved to 92%.  Moderate likelihood of a tracheal foreign body, possibly displaced lower, during placement of the ET-tube.    Bedside bronchoscopy Date/Time: 12/15/2016 5:54 PM Performed by: Mancel BaleWentz, Mason Dibiasio, MD Authorized by: Mancel BaleWentz, Meiko Ives, MD  Consent: The procedure was performed in an emergent situation. Required items: required blood products, implants, devices, and special equipment available Patient identity confirmed: arm band and hospital-assigned identification number Time out: Immediately prior to procedure a "time out" was called to verify the correct patient, procedure, equipment, support staff and site/side marked as required. Local anesthesia used: no  Anesthesia: Local anesthesia used: no  Sedation: Patient sedated: yes  Patient tolerance: Patient tolerated the procedure well with no immediate complications Comments: The ventilator was disconnected briefly after ensuring the oxygen saturation was 100%.  Patient currently being treated with 100% oxygen by ventilator.  Using a Ambu Scope, the entire ET tube was visualized, to the tip.  Oxygen was administered through the accessory port.  There was a small amount of secretion which was suctioned, through the accessory port.  At the end of the tube, abutting the tube was a soft tissue mass which appears to be the impediment to advancing the ET tube.  The endoscope could not be passed, beyond the soft tissue mass, through the endotracheal tube.  Representative image was obtained and is documented in this chart.  This does not appear to be a foreign body, obstructing the trachea, but appears as a polyp with vascular structures evident.  The obstruction appears to be partial, distal trachea could not be visualized.  .Critical Care Performed by: Mancel BaleWentz, Hafiz Irion, MD Authorized by: Mancel BaleWentz, Kristapher [place], MD   Critical care provider statement:    Critical care time (minutes):  140   Critical care start time:  12/15/2016 1:15 PM    Critical care end time:  12/15/2016 7:09 PM   Critical care time was exclusive of:  Separately billable procedures and treating other patients   Critical care was necessary to treat or prevent imminent or life-threatening deterioration of the following conditions:  Respiratory failure   Critical care was time spent personally by me on the following activities:  Blood draw for specimens, discussions with consultants, evaluation of patient's response to treatment, examination of patient, obtaining history from patient or surrogate, ordering and performing treatments and interventions, ordering and review of laboratory studies, ordering  and review of radiographic studies, pulse oximetry, re-evaluation of patient's condition, review of old charts and ventilator management   I assumed direction of critical care for this patient from another provider in my specialty: no     (including critical care time)        Medications Ordered in ED Medications  heparin bolus via infusion 2,000 Units (not administered)    Followed by  heparin ADULT infusion 100 units/mL (25000 units/277mL sodium chloride 0.45%) (not administered)  sodium chloride 0.9 % bolus 500 mL (0 mLs Intravenous Stopped 12/15/16 1414)  etomidate (AMIDATE) injection (20 mg Intravenous Given 12/15/16 1337)  succinylcholine (ANECTINE) injection (150 mg Intravenous Given 12/15/16 1338)  LORazepam (ATIVAN) injection 2 mg (2 mg Intravenous Given 12/15/16 1404)  fentaNYL (SUBLIMAZE) injection 100 mcg (100 mcg Intravenous Given 12/15/16 1404)  propofol (DIPRIVAN) 1000 MG/100ML infusion (40 mcg/kg/min  130.6 kg Intravenous Rate/Dose Change 12/15/16 1947)  levETIRAcetam (KEPPRA) IVPB 1000 mg/100 mL premix (0 mg Intravenous Stopped 12/15/16 1538)  cefTRIAXone (ROCEPHIN) 1 g in dextrose 5 % 50 mL IVPB (0 g Intravenous Stopped 12/15/16 1613)  LORazepam (ATIVAN) injection 2 mg (2 mg Intravenous Given 12/15/16 1545)  propofol (DIPRIVAN) 10 mg/mL  bolus/IV push 75 mg (75 mg Intravenous Given 12/15/16 1745)  fentaNYL (SUBLIMAZE) injection 100 mcg (100 mcg Intravenous Given 12/15/16 1945)     Initial Impression / Assessment and Plan / ED Course  I have reviewed the triage vital signs and the nursing notes.  Pertinent labs & imaging results that were available during my care of the patient were reviewed by me and considered in my medical decision making (see chart for details).  Clinical Course as of Dec 16 1999  Thu Dec 15, 2016  1316 Hypoxia, 81% on room air, oxygen supplementation began  [EW]  1317 Tachypnea present we will check a chest x-ray  [EW]  1325 Called to room for low saturations.  Patient still tachypneic, O2 saturations on facemask, 100% oxygen, 89%.  This is low.  Patient continues to have left-sided gaze, now with rhythmic movements of jaw, consistent with seizure.  Portions made for intubation.  Patient had to be removed to a different room, and equipment obtained.  [EW]  1450 The case was discussed with Dr. Wallace Cullens, intensivist at [facility].  They are unable to accept the patient there because there are no ICU beds currently.  Will contact neighboring, referral facilities.  [EW]  1501 Normal Lactic Acid, Venous: 0.56 [EW]  1501 Normal WBC: 10.0 [EW]  1501 Elevated WBC, UA: TOO NUMEROUS TO COUNT [EW]  1501 Normal Sodium: 138 [EW]  1501 Elevated Creatinine: (!) 1.46 [EW]  1502 Rocephin started for possible UTI, urine culture ordered.  Keppra loading dose given for possible subtherapeutic Keppra level leading to seizure.  [EW]  1528 [facility] was contacted for possible transfer, they have no ICU beds at this time.  [facility] has been contacted (discussed with Dr. Luvenia Heller) and the patient is accepted to a neuro ICU bed, but there are no beds currently available.  The patient is currently on a waiting list at [facility].  We will continue to, try to find placement, first available.  [EW]  1835 The patient has been  accepted at [facility] by Dr. Margo Common Al-heglan.  Transfer will be pending a open bed, and transport there.  At this point, plan on [facility] transport.  [EW]    Clinical Course User Index [EW] Mancel Bale, MD  This patients CHA2DS2-VASc Score and unadjusted Ischemic Stroke Rate (% per year) is equal to 2.2 % stroke rate/year from a score of 2  Above score calculated as 1 point each if present [CHF, HTN, DM, Vascular=MI/PAD/Aortic Plaque, Age if 65-74, or Male] Above score calculated as 2 points each if present [Age > 75, or Stroke/TIA/TE]     Patient Vitals for the past 24 hrs:  BP Temp Temp src Pulse Resp SpO2 Height Weight  12/15/16 2000 112/85 100.2 F (37.9 C) - 62 14 100 % - -  12/15/16 1955 109/77 100.2 F (37.9 C) - 63 14 100 % - -  12/15/16 1950 110/83 100.2 F (37.9 C) - (P) 60 14 100 % - -  12/15/16 1947 - - - - - 100 % - -  12/15/16 1945 109/81 100.2 F (37.9 C) - 70 - 100 % - -  12/15/16 1915 (!) 131/108 100.2 F (37.9 C) - 65 - 100 % - -  12/15/16 1900 136/90 100.2 F (37.9 C) - 78 16 100 % - -  12/15/16 1845 (!) 141/97 100.2 F (37.9 C) - 71 - 100 % - -  12/15/16 1830 (!) 146/108 100.2 F (37.9 C) - 72 - 100 % - -  12/15/16 1815 (!) 147/106 100.2 F (37.9 C) - 69 16 100 % - -  12/15/16 1800 (!) 153/112 100 F (37.8 C) - 74 16 100 % - -  12/15/16 1752 - - - 71 14 100 % - -  12/15/16 1745 (!) 144/106 99.9 F (37.7 C) - 69 - 100 % - -  12/15/16 1730 (!) 115/102 100 F (37.8 C) - 75 - 100 % - -  12/15/16 1715 (!) 150/108 99.9 F (37.7 C) - 82 18 100 % - -  12/15/16 1700 (!) 149/102 99.9 F (37.7 C) - 79 (!) 22 100 % - -  12/15/16 1645 (!) 145/109 99.9 F (37.7 C) - 65 15 100 % - -  12/15/16 1630 (!) 140/108 99.9 F (37.7 C) - 85 15 99 % - -  12/15/16 1615 (!) 147/111 99.9 F (37.7 C) - 80 14 100 % - -  12/15/16 1600 (!) 143/113 99.7 F (37.6 C) - 74 14 100 % - -  12/15/16 1545 (!) 136/105 99.5 F (37.5 C) - 79 14 100 % - -    12/15/16 1530 (!) 141/109 99.3 F (37.4 C) - 68 17 100 % - -  12/15/16 1515 (!) 152/106 98.8 F (37.1 C) - 71 16 100 % - -  12/15/16 1514 (!) 136/109 98.9 F (37.2 C) Core 69 16 100 % - -  12/15/16 1500 (!) 147/108 98.4 F (36.9 C) - 74 15 100 % - -  12/15/16 1445 (!) 143/108 97.9 F (36.6 C) - 90 14 100 % - -  12/15/16 1430 (!) 153/113 (!) 97.3 F (36.3 C) - 97 16 100 % - -  12/15/16 1427 (!) 133/105 (!) 97.3 F (36.3 C) Bladder 73 17 100 % - -  12/15/16 1415 (!) 134/94 (!) 95.9 F (35.5 C) - 78 15 100 % - -  12/15/16 1412 - - - 65 (!) 24 100 % - -  12/15/16 1410 (!) 133/106 - - 71 16 100 % - -  12/15/16 1405 - - - - - 100 % 6\' 4"  (1.93 m) 130.6 kg (288 lb)  12/15/16 1400 (!) 134/105 - - 84 (!) 29 100 % - -  12/15/16 1346 - - -  88 (!) 24 92 % - -  12/15/16 1345 - - - 98 (!) 33 (!) 88 % - -  12/15/16 1342 (!) 163/102 - - 72 (!) 24 (!) 80 % - -  12/15/16 1335 - - - (!) 123 (!) 24 (!) 85 % - -  12/15/16 1311 - - - - - - 6\' 4"  (1.93 m) 130.6 kg (288 lb)  12/15/16 1309 (!) 165/107 - - 77 (!) 28 (!) 81 % - -    7:07 PM Reevaluation with update and discussion. After initial assessment and treatment, an updated evaluation reveals he remains critically stable, no change in respiratory status. Mancel Bale      Final Clinical Impressions(s) / ED Diagnoses   Final diagnoses:  Acute respiratory failure with hypoxemia (HCC)  Seizure (HCC)  Tracheal mass  Urinary tract infection without hematuria, site unspecified  Permanent atrial fibrillation [facility])    Patient presenting with altered mental status, and hypoxia, with suspected seizure.  Acute respiratory failure with hypoxia, likely multifactorial.  Finding of tracheal mass preventing normal placement of endotracheal tube.  Respiratory status stabilized with high riding endotracheal tube, which cannot be passed further, visualized mass preventing this being placed in a normal position.  Patient maintained sedated, to help keep the  tracheal tube in place.  No recurrent seizures in the ED, following loaded with Keppra IV.  Likely urinary tract infection.  Doubt sepsis.  Chronic atrial fibrillation with somewhat low INR.  Patient has mild bleeding, post upper airway manipulation for endotracheal intubation.  No other evidence of bleeding.  Patient requires reversible anticoagulation since he may need a surgical procedure for the airway, mass, shortly.  Heparin per pharmacy protocol ordered.  Nursing Notes Reviewed/ Care Coordinated Applicable Imaging Reviewed Interpretation of Laboratory Data incorporated into ED treatment  Plan: Admit  ED Discharge Orders    None       Mancel Bale, MD 12/15/16 2001

## 2016-12-15 NOTE — ED Triage Notes (Signed)
[facility]  EMS reports pt had unwitnessed seizure.  Staff found pt laying in bed decreased responsiveness.   and called ems.  EMS says pt usually walks with a walker and is verbal.  Pt has not been verbal since the seizure.  PT's r foot twitching upon arrival.  O2 sat 84% on room air per FD.  02 sat 79 on room air.  Placed pt on 02 at 4liters and sat increased to 91%.  EMS also reports pt afib on monitor and did not have afib listed in medical history.

## 2016-12-15 NOTE — ED Notes (Signed)
Heard pt  "gargling"  From the hallway.  Went in to check on pt, pt unresponsive 02 sat 80's and decreasing on 4 liters. Called for help while suctioning pt's mouth.  Placed pt on 100% NRB, o2 sat 90-91 and Dr. Effie ShyWentz at bedside.   EDP assessed pt and no gag reflex present.  Moved pt to room 16 to intubate.

## 2016-12-15 NOTE — ED Notes (Signed)
Attempted to call report, nurse stating to call back in 15 minutes.

## 2016-12-15 NOTE — ED Notes (Signed)
Post intubation, pt coughing and resisting the tube.  Notified EDP and ativan and fentanyl were administered.   Propofol infusing at this time.  Pt calm, resting, eyes closed.

## 2016-12-15 NOTE — ED Notes (Signed)
Pt carried out on stretcher to helicopter.

## 2016-12-15 NOTE — Sedation Documentation (Signed)
Xray called for stat portable chest 

## 2016-12-15 NOTE — ED Notes (Signed)
Pt coughing, edp at bedside.  Increased propofol to 1515mcg/kg/min

## 2016-12-15 NOTE — ED Notes (Signed)
[facility] and then [facility] for Dr. Effie ShyWentz.

## 2016-12-15 NOTE — ED Notes (Signed)
Temp. Foley done instead of in/out cath.

## 2016-12-16 DIAGNOSIS — J398 Other specified diseases of upper respiratory tract: Secondary | ICD-10-CM | POA: Insufficient documentation

## 2016-12-17 LAB — URINE CULTURE: Culture: NO GROWTH

## 2016-12-20 LAB — CULTURE, BLOOD (ROUTINE X 2)
CULTURE: NO GROWTH
Culture: NO GROWTH
SPECIAL REQUESTS: ADEQUATE
Special Requests: ADEQUATE

## 2017-08-17 ENCOUNTER — Ambulatory Visit: Payer: Medicare Other | Admitting: Podiatry

## 2017-08-21 ENCOUNTER — Encounter: Payer: Self-pay | Admitting: Podiatry

## 2017-08-21 ENCOUNTER — Ambulatory Visit (INDEPENDENT_AMBULATORY_CARE_PROVIDER_SITE_OTHER): Payer: Medicare Other | Admitting: Podiatry

## 2017-08-21 DIAGNOSIS — M79609 Pain in unspecified limb: Principal | ICD-10-CM

## 2017-08-21 DIAGNOSIS — B351 Tinea unguium: Secondary | ICD-10-CM | POA: Diagnosis not present

## 2017-08-21 DIAGNOSIS — D689 Coagulation defect, unspecified: Secondary | ICD-10-CM | POA: Diagnosis not present

## 2017-08-21 DIAGNOSIS — M79676 Pain in unspecified toe(s): Secondary | ICD-10-CM

## 2017-08-21 NOTE — Progress Notes (Addendum)
Complaint:  Visit Type: Patient returns to my office for continued preventative foot care services. Complaint: Patient states" my nails have grown long and thick and become painful to walk and wear shoes" Patient has been taking coumadin. The patient presents for preventative foot care services. No changes to ROS.  Patient was last seen in 2017.  Podiatric Exam: Vascular: dorsalis pedis and posterior tibial pulses are palpable bilateral. Capillary return is immediate. Temperature gradient is WNL. Skin turgor WNL  Sensorium: Normal Semmes Weinstein monofilament test. Normal tactile sensation bilaterally. Nail Exam: Pt has thick disfigured discolored nails with subungual debris noted bilateral entire nail hallux through fifth toenails Ulcer Exam: There is no evidence of ulcer or pre-ulcerative changes or infection. Orthopedic Exam: Muscle tone and strength are WNL. No limitations in general ROM. No crepitus or effusions noted. Foot type and digits show no abnormalities. Bony prominences are unremarkable. Skin: No Porokeratosis. No infection or ulcers  Diagnosis:  Onychomycosis, , Pain in right toe, pain in left toes  Treatment & Plan Procedures and Treatment: Consent by patient was obtained for treatment procedures.   Debridement of mycotic and hypertrophic toenails, 1 through 5 bilateral and clearing of subungual debris. No ulceration, no infection noted.  ABN signed for 2019. Return Visit-Office Procedure: Patient instructed to return to the office for a follow up visit 4 months for continued evaluation and treatment.    Helane GuntherGregory Mamta Rimmer DPM

## 2017-12-18 ENCOUNTER — Ambulatory Visit: Payer: Medicare Other | Admitting: Podiatry

## 2018-03-13 IMAGING — CT CT ANGIO NECK
1 of 8 series · 6 of 33 positions shown · IV contrast (Isovue)
Comparison: CT HEAD August 16, 2016 at 4434 hours

CLINICAL DATA: Follow-up code stroke. Unresponsive. History of
seizures, hypertension.

EXAM:
CT ANGIOGRAPHY HEAD AND NECK
TECHNIQUE: Multidetector CT imaging of the head and neck was performed using
the standard protocol during bolus administration of intravenous
contrast. Multiplanar CT image reconstructions and MIPs were
obtained to evaluate the vascular anatomy. Carotid stenosis
measurements (when applicable) are obtained utilizing NASCET
criteria, using the distal internal carotid diameter as the
denominator.
CONTRAST:  100 cc Isovue 370

[Series 6: ax thin · axial · 0.39mm/px · z∈[+1211,+1499]mm · 6 of 404 slices shown]
[im 58/404  soft-tissue]
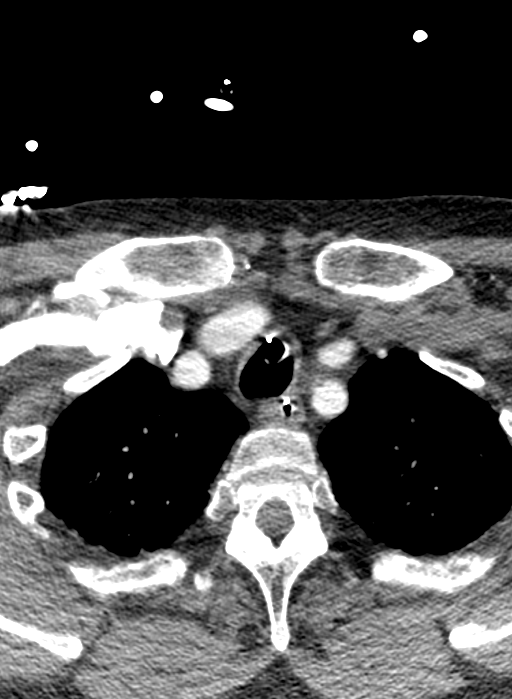
[im 116/404  bone]
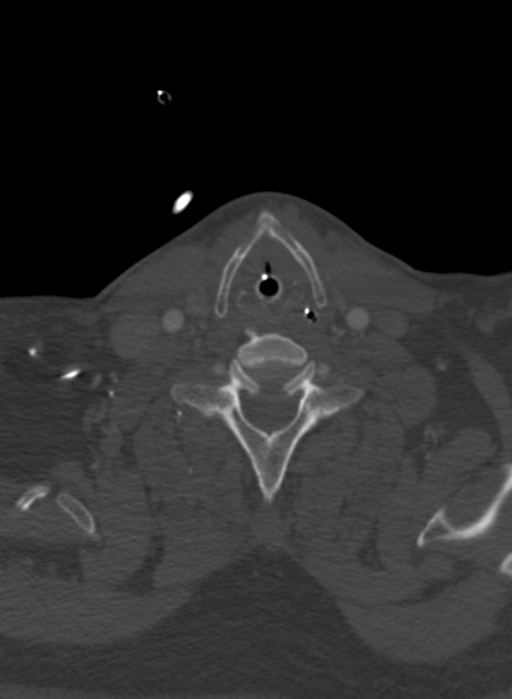
[im 173/404  soft-tissue]
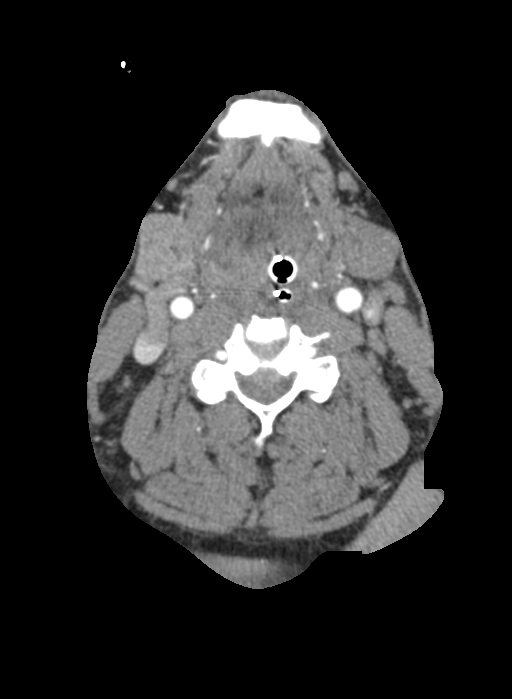
[im 231/404  bone]
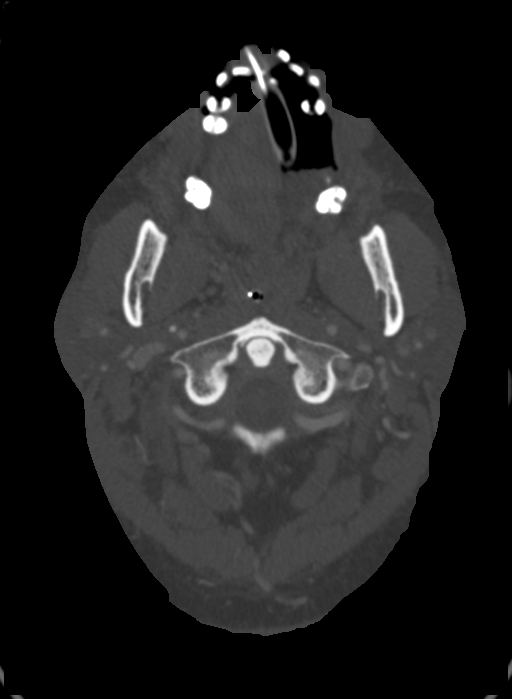
[im 288/404  soft-tissue]
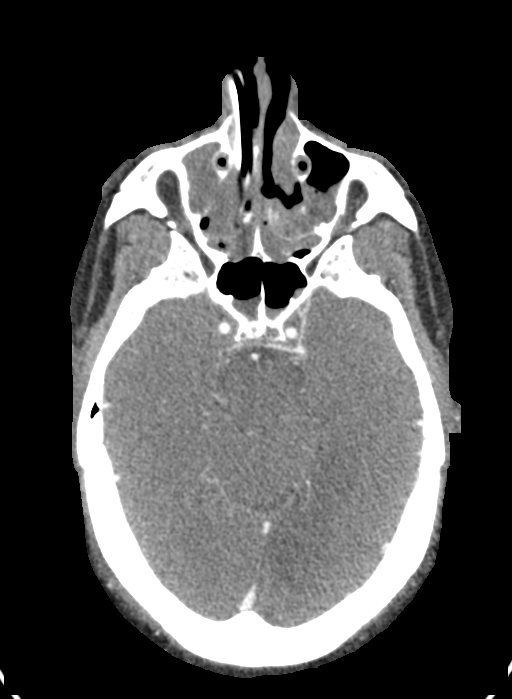
[im 346/404  bone]
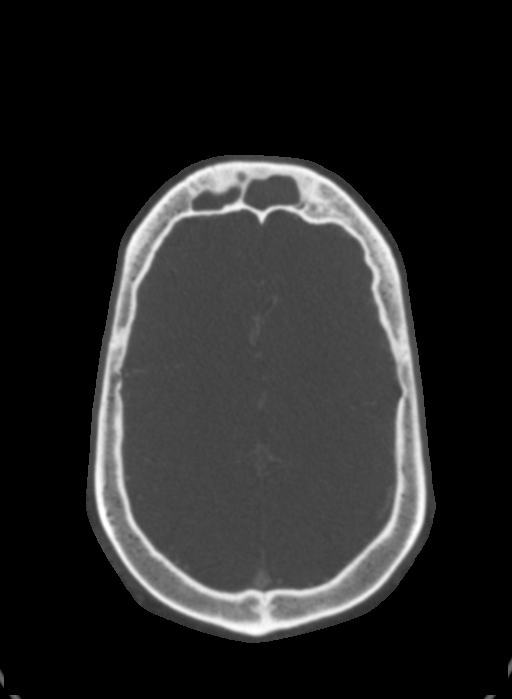

[6 of 33 positions shown; findings below may reference images not displayed]

FINDINGS: CTA NECK

AORTIC ARCH: Normal appearance of the thoracic arch, normal branch
pattern. Mild atherosclerosis. The origins of the innominate, left
Common carotid artery and subclavian artery are widely patent.

RIGHT CAROTID SYSTEM: Common carotid artery is widely patent. Normal
appearance of the carotid bifurcation without hemodynamically
significant stenosis by NASCET criteria. Normal appearance of the
included internal carotid artery, tonsillar loop.

LEFT CAROTID SYSTEM: Common carotid artery is widely patent. Normal
appearance of the carotid bifurcation without hemodynamically
significant stenosis by NASCET criteria. Normal appearance of the
included internal carotid artery.

VERTEBRAL ARTERIES:Left vertebral artery is dominant. Normal
appearance of the vertebral arteries, vertebral arteries are patent,
mild extrinsic deformity due to degenerative cervical spine.

SKELETON: No acute osseous process though bone windows have not been
submitted. Severe C5-6 degenerative disc. Severe RIGHT C5-6 neural
foraminal narrowing.

OTHER NECK: Soft tissues of the neck are non-acute though, not
tailored for evaluation. Life-support lines in place. Tree-in-bud
infiltrates RIGHT upper lobe. Air debris level LEFT mainstem
bronchus, incompletely imaged.

CTA HEAD

ANTERIOR CIRCULATION: Patent cervical internal carotid arteries,
petrous, cavernous and supra clinoid internal carotid arteries. Mild
stenosis RIGHT supraclinoid internal carotid artery. Mild calcific
atherosclerosis carotid siphons. Widely patent anterior
communicating artery. Patent anterior and middle cerebral arteries.

No large vessel occlusion, significant stenosis, contrast
extravasation or aneurysm.

POSTERIOR CIRCULATION: Moderate stenosis proximal RIGHT V4 segment,
severely stenotic distal RIGHT V4 segment distal to the
posterior-inferior cerebellar artery origin. Vertebrobasilar
junction and basilar artery, as well as main branch vessels.

No large vessel occlusion, contrast extravasation or aneurysm.

VENOUS SINUSES: Major dural venous sinuses are patent though not
tailored for evaluation on this angiographic examination.

ANATOMIC VARIANTS: None.

DELAYED PHASE: No abnormal intracranial enhancement.

MIP images reviewed.
IMPRESSION: CTA NECK:

1. Mild atherosclerosis without hemodynamically significant stenosis
or acute vascular process.
2. Tree-in-bud infiltrates RIGHT upper lobe may be infectious or
inflammatory. Debris within LEFT mainstem bronchus.
CTA HEAD:

1. No emergent large vessel occlusion.
2. Mild intracranial atherosclerosis. Severe stenosis distal RIGHT
V4 segment.

## 2018-03-13 IMAGING — MR MR HEAD W/O CM
11 of 12 series · 34 of 48 positions shown · non-contrast
Comparison: CT/CTA head 08/17/2016

CLINICAL DATA: Encephalopathy

EXAM:
MRI HEAD WITHOUT CONTRAST
TECHNIQUE: Multiplanar, multiecho pulse sequences of the brain and surrounding
structures were obtained without intravenous contrast.

[Series 3: DWI · axial · 3.0mm · 0.94mm/px · z∈[-11,+136]mm · 6 of 100 slices shown (1 of 2)]
[im 1/100]
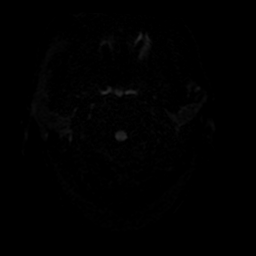
[im 20/100]
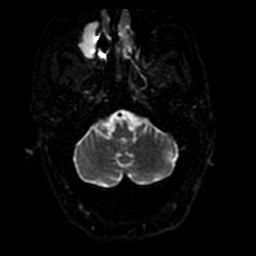
[im 40/100]
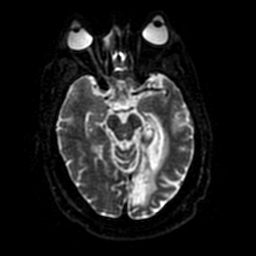
[im 60/100]
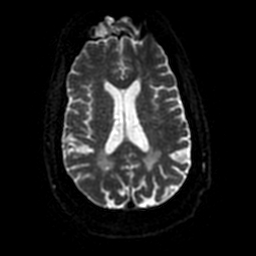
[im 80/100]
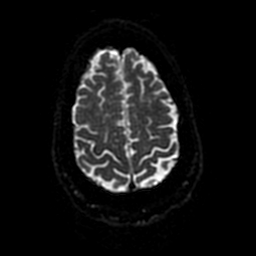
[im 100/100]
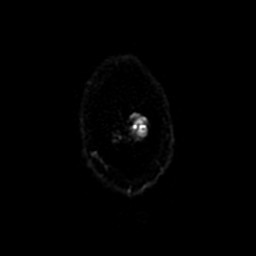

[Series 4: DWI · coronal · 4.0mm · 0.94mm/px · 5 of 72 slices shown (2 of 2)]
[im 1/72]
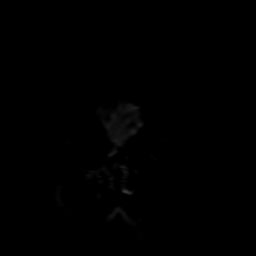
[im 18/72]
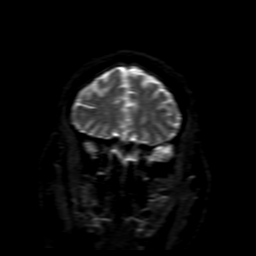
[im 36/72]
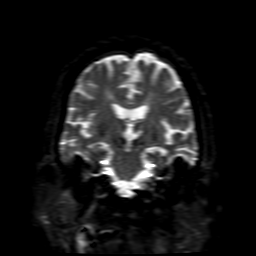
[im 54/72]
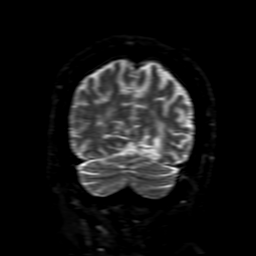
[im 72/72]
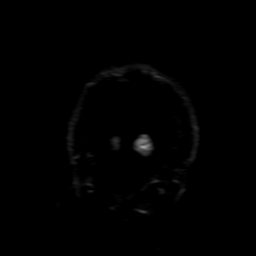

[Series 5: T2 · axial · 5.0mm · 0.47mm/px · z∈[-19,+149]mm · 2 of 29 slices shown (1 of 2)]
[im 1/29]
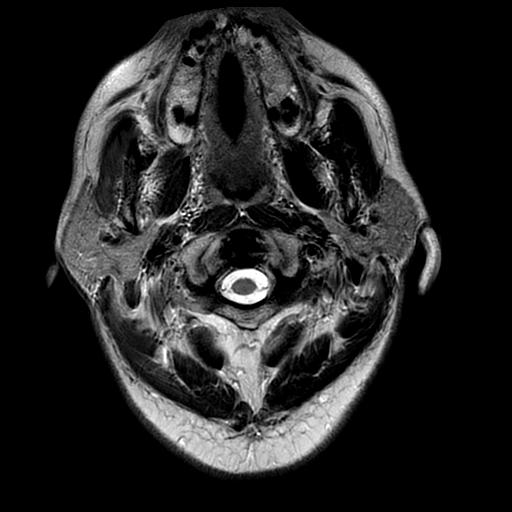
[im 29/29]
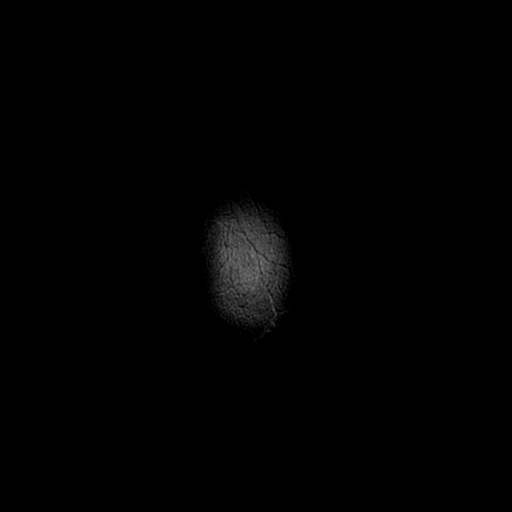

[Series 6: FLAIR · sagittal · 5.0mm · 0.47mm/px · 2 of 23 slices shown (1 of 2)]
[im 1/23]
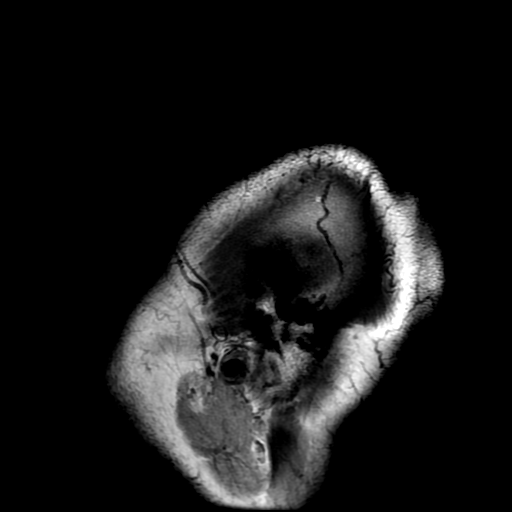
[im 23/23]
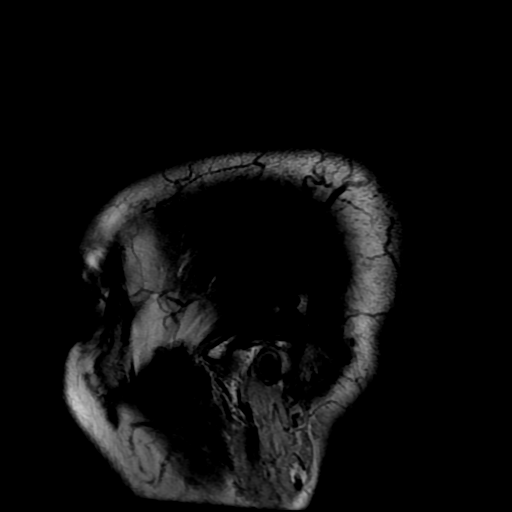

[Series 7: FLAIR · axial · 5.0mm · 0.47mm/px · z∈[-19,+149]mm · 2 of 29 slices shown (2 of 2)]
[im 1/29]
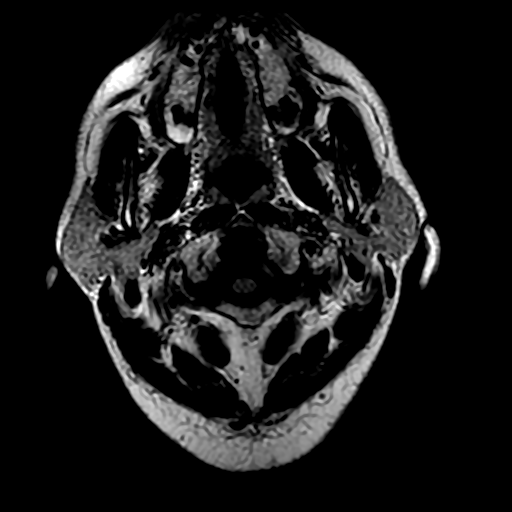
[im 29/29]
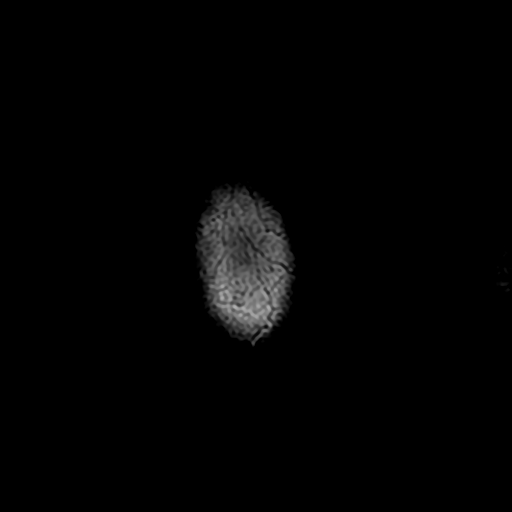

[Series 8: ax 3(person_name) · axial · 3.0mm · 0.94mm/px · z∈[-6,+159]mm · 4 of 56 slices shown]
[im 1/56]
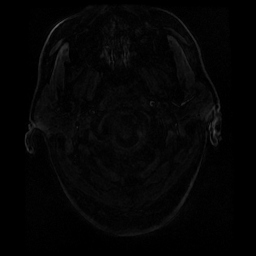
[im 19/56]
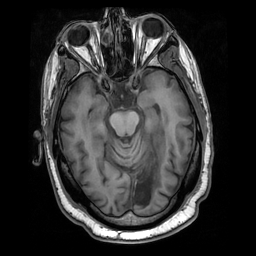
[im 37/56]
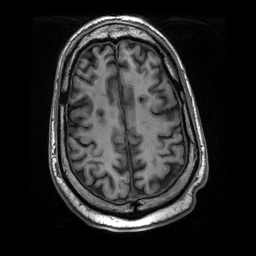
[im 56/56]
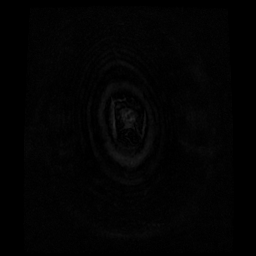

[Series 9: (person_name) · axial · 3.0mm · 0.47mm/px · z∈[-13,+8]mm · 2 of 104 slices shown]
[im 1/104]
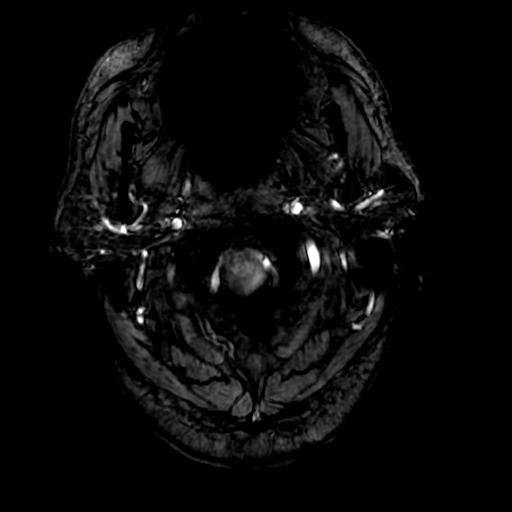
[im 15/104]
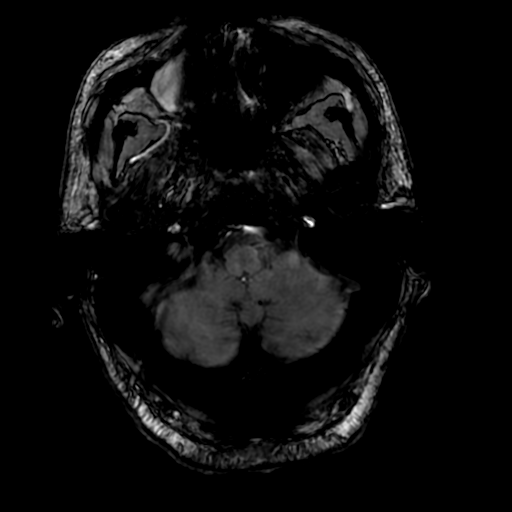

[Series 10: T2 · coronal · 5.0mm · 0.47mm/px · 2 of 29 slices shown (2 of 2)]
[im 1/29]
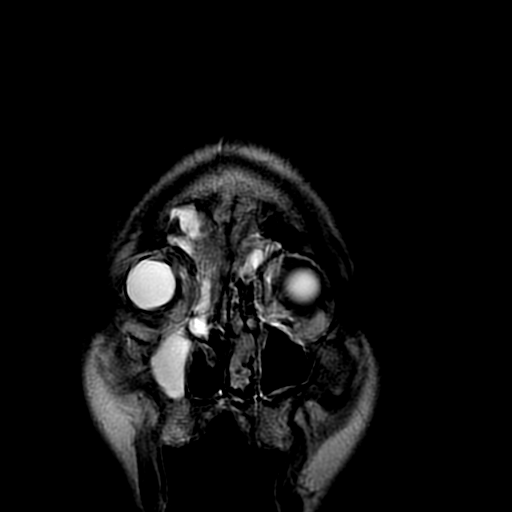
[im 29/29]
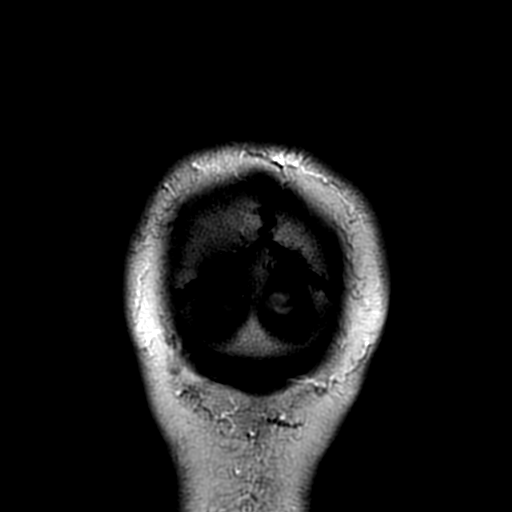

[Series 11: T2 fat-sat · coronal · 3.0mm · 0.43mm/px · 2 of 32 slices shown]
[im 1/32]
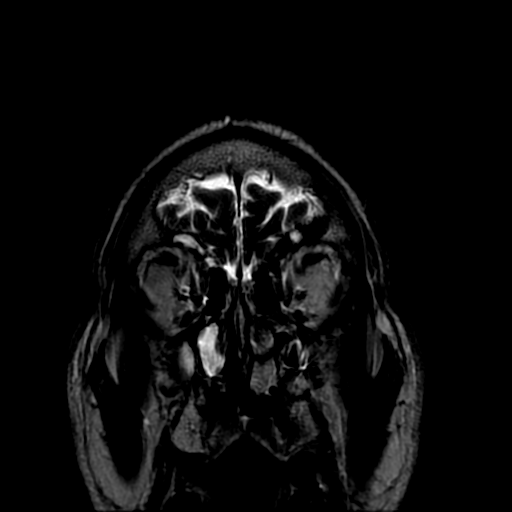
[im 32/32]
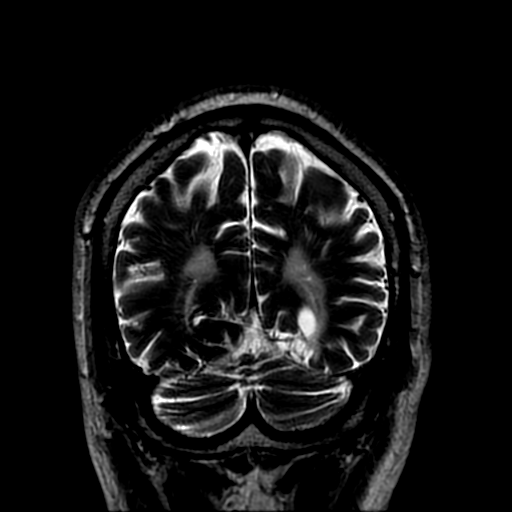

[Series 350: ADC · axial · 3.0mm · 0.94mm/px · z∈[-11,+136]mm · 4 of 50 slices shown (1 of 2)]
[im 1/50]
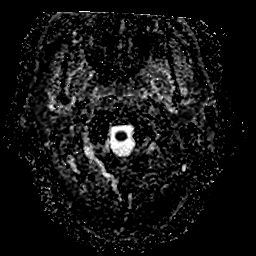
[im 17/50]
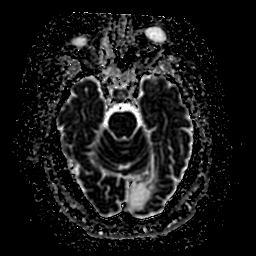
[im 33/50]
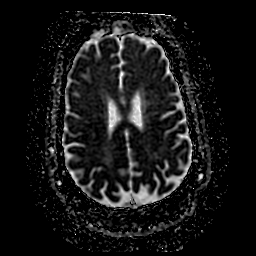
[im 50/50]
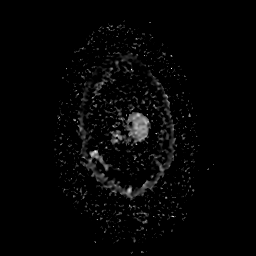

[Series 450: ADC · coronal · 4.0mm · 0.94mm/px · 3 of 36 slices shown (2 of 2)]
[im 1/36]
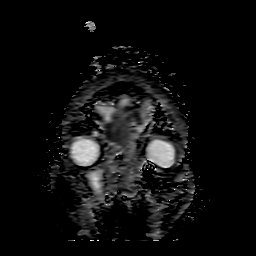
[im 18/36]
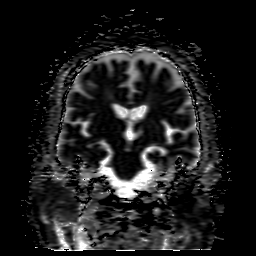
[im 36/36]
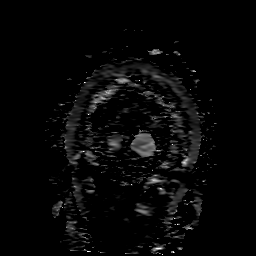

[34 of 48 positions shown; findings below may reference images not displayed]

FINDINGS: Brain: The midline structures are normal. There is no focal
diffusion restriction to indicate acute infarct. There is an old
left PCA infarct with associated gliosis and encephalomalacia. There
are old bilateral corona radiata lacunar infarcts. There is
multifocal hyperintense T2-weighted signal within the
periventricular white matter, most often seen in the setting of
chronic microvascular ischemia. No intraparenchymal hematoma or
chronic microhemorrhage. Brain volume is normal for age without
age-advanced or lobar predominant atrophy. The dura is normal and
there is no extra-axial collection.

Vascular: Major intracranial arterial and venous sinus flow voids
are preserved.

Skull and upper cervical spine: The visualized skull base,
calvarium, upper cervical spine and extracranial soft tissues are
normal.

Sinuses/Orbits: There are fluid levels within both sphenoid sinuses.
There is complete opacification of frontal sinuses and right
maxillary sinus. Moderate right ethmoid opacification. Normal
orbits.
IMPRESSION: 1. No acute ischemia or other acute abnormality.
2. Old left PCA infarcts and bilateral old corona radiata lacunar
infarcts, superimposed on chronic microvascular ischemia.

## 2018-07-11 IMAGING — CR DG CHEST 1V PORT
1 series · 2 of 2 positions shown · non-contrast
Comparison: 08/18/2016.

CLINICAL DATA: Hypoxia.  Tachypneic.

EXAM:
PORTABLE CHEST 1 VIEW

[Series 1: portable · 0.17mm/px · 2 of 2 slices shown]
[im 1/2]
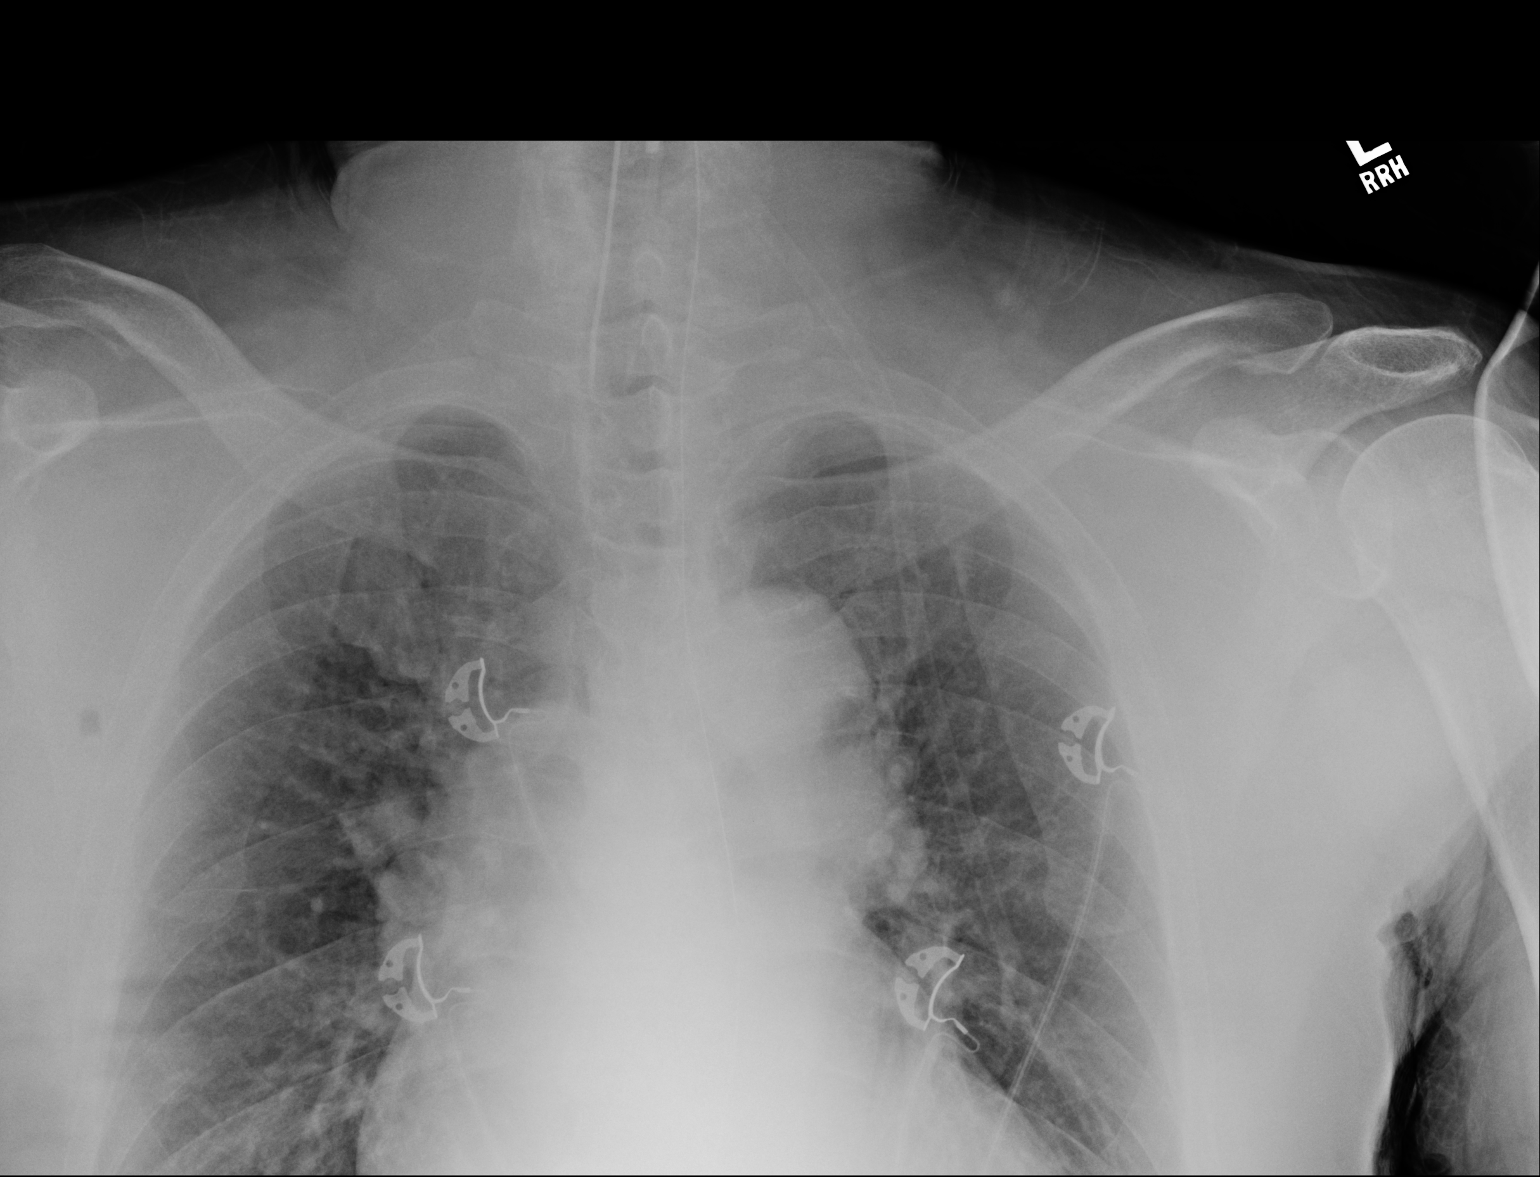
[im 2/2]
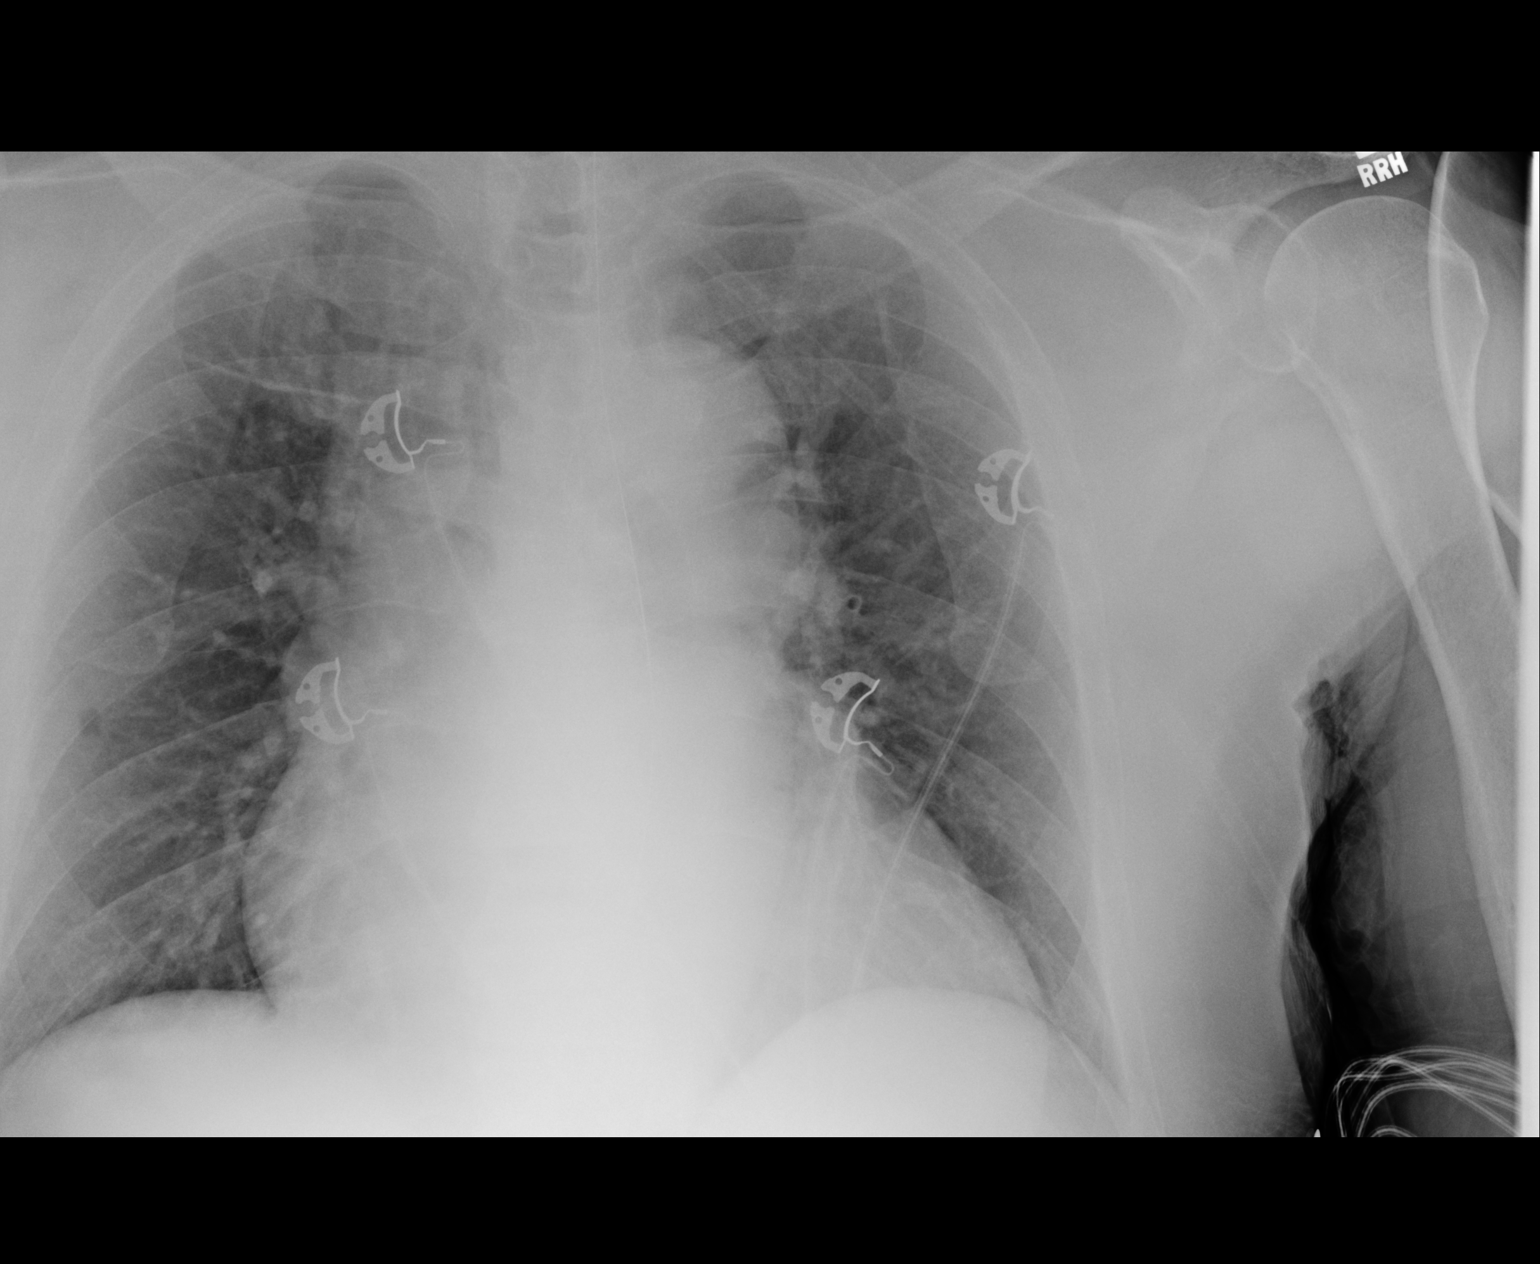

[2 of 2 positions shown; findings below may reference images not displayed]

FINDINGS: Endotracheal tube noted at the thoracic inlet 8.6 cm above the
carina. NG tube noted with tip below left hemidiaphragm.
Cardiomegaly with mild pulmonary vascular prominence. No focal
infiltrate. No pleural effusion or pneumothorax.
IMPRESSION: 1. Endotracheal tube noted at the thoracic inlet 8.6 cm above the
carina . NG tube noted with tip below left hemidiaphragm.

2. Cardiomegaly. Mild pulmonary vascular prominence. No evidence of
overt pulmonary edema. No focal pulmonary infiltrate .

## 2019-08-19 ENCOUNTER — Telehealth: Payer: Self-pay

## 2019-08-19 NOTE — Telephone Encounter (Signed)
We received a referral to see dr. Servando Snare but there was no diagnosis code on the referral. Faxed it back to them asking for the diagnosis.

## 2021-01-12 ENCOUNTER — Other Ambulatory Visit: Payer: Self-pay

## 2021-01-12 ENCOUNTER — Ambulatory Visit (INDEPENDENT_AMBULATORY_CARE_PROVIDER_SITE_OTHER): Payer: Medicare Other | Admitting: Podiatry

## 2021-01-12 ENCOUNTER — Encounter: Payer: Self-pay | Admitting: Podiatry

## 2021-01-12 DIAGNOSIS — B351 Tinea unguium: Secondary | ICD-10-CM | POA: Diagnosis not present

## 2021-01-12 DIAGNOSIS — L989 Disorder of the skin and subcutaneous tissue, unspecified: Secondary | ICD-10-CM | POA: Diagnosis not present

## 2021-01-12 DIAGNOSIS — M79674 Pain in right toe(s): Secondary | ICD-10-CM

## 2021-01-12 DIAGNOSIS — M79675 Pain in left toe(s): Secondary | ICD-10-CM

## 2021-01-20 NOTE — Progress Notes (Signed)
° °  SUBJECTIVE Patient presents to office today complaining of elongated, thickened nails that cause pain while ambulating in shoes.  Patient is unable to trim their own nails. Patient is here for further evaluation and treatment.  Past Medical History:  Diagnosis Date   CHF (congestive heart failure) (HCC)    CKD (chronic kidney disease) stage 3, GFR 30-59 ml/min (HCC)    Edema    Hyperlipemia    Hypertension    Seizures (HCC)     OBJECTIVE General Patient is awake, alert, and oriented x 3 and in no acute distress. Derm Skin is dry and supple bilateral. Negative open lesions or macerations. Remaining integument unremarkable. Nails are tender, long, thickened and dystrophic with subungual debris, consistent with onychomycosis, 1-5 bilateral. No signs of infection noted.  Hyperkeratotic preulcerative callus tissue also noted to the bilateral feet Vasc  DP and PT pedal pulses palpable bilaterally. Temperature gradient within normal limits.  Neuro Epicritic and protective threshold sensation grossly intact bilaterally.  Musculoskeletal Exam No symptomatic pedal deformities noted bilateral. Muscular strength within normal limits.  ASSESSMENT 1.  Pain due to onychomycosis of toenails both 2.  Preulcerative callus lesions bilateral feet  PLAN OF CARE 1. Patient evaluated today.  2. Instructed to maintain good pedal hygiene and foot care.  3. Mechanical debridement of nails 1-5 bilaterally performed using a nail nipper. Filed with dremel without incident.  4.  Excisional debridement of the hyperkeratotic callus tissue was performed using a tissue nipper without incident or bleeding Return to clinic in 3 mos. for routine foot care   Nathan Shepherd, DPM [facility]  Nathan Shepherd, DPM    [address]                                     [place], [place] [address]                Office [phone]  Fax [phone]    

## 2021-08-21 ENCOUNTER — Emergency Department: Payer: Medicare Other

## 2021-08-21 ENCOUNTER — Inpatient Hospital Stay (HOSPITAL_COMMUNITY): Payer: Medicare Other

## 2021-08-21 ENCOUNTER — Inpatient Hospital Stay (HOSPITAL_COMMUNITY)
Admission: EM | Admit: 2021-08-21 | Discharge: 2021-08-28 | DRG: 100 | Disposition: A | Discharge status: Home Health | Payer: Medicare Other | Attending: Internal Medicine | Admitting: Internal Medicine

## 2021-08-21 ENCOUNTER — Encounter (HOSPITAL_COMMUNITY): Payer: Self-pay

## 2021-08-21 ENCOUNTER — Emergency Department
Admission: EM | Admit: 2021-08-21 | Discharge: 2021-08-21 | Disposition: A | Discharge status: Other Acute Inpatient Hospital | Payer: Medicare Other | Attending: Emergency Medicine | Admitting: Emergency Medicine

## 2021-08-21 ENCOUNTER — Other Ambulatory Visit: Payer: Self-pay

## 2021-08-21 ENCOUNTER — Inpatient Hospital Stay: Admit: 2021-08-21 | Payer: Medicare Other | Admitting: Internal Medicine

## 2021-08-21 DIAGNOSIS — F0150 Vascular dementia without behavioral disturbance: Secondary | ICD-10-CM | POA: Diagnosis present

## 2021-08-21 DIAGNOSIS — J9601 Acute respiratory failure with hypoxia: Secondary | ICD-10-CM

## 2021-08-21 DIAGNOSIS — E876 Hypokalemia: Secondary | ICD-10-CM | POA: Diagnosis present

## 2021-08-21 DIAGNOSIS — Z20822 Contact with and (suspected) exposure to covid-19: Secondary | ICD-10-CM | POA: Diagnosis not present

## 2021-08-21 DIAGNOSIS — J690 Pneumonitis due to inhalation of food and vomit: Secondary | ICD-10-CM | POA: Insufficient documentation

## 2021-08-21 DIAGNOSIS — J9801 Acute bronchospasm: Secondary | ICD-10-CM | POA: Diagnosis not present

## 2021-08-21 DIAGNOSIS — E785 Hyperlipidemia, unspecified: Secondary | ICD-10-CM | POA: Diagnosis present

## 2021-08-21 DIAGNOSIS — J9809 Other diseases of bronchus, not elsewhere classified: Secondary | ICD-10-CM | POA: Insufficient documentation

## 2021-08-21 DIAGNOSIS — J9621 Acute and chronic respiratory failure with hypoxia: Secondary | ICD-10-CM | POA: Insufficient documentation

## 2021-08-21 DIAGNOSIS — N179 Acute kidney failure, unspecified: Secondary | ICD-10-CM | POA: Diagnosis not present

## 2021-08-21 DIAGNOSIS — D631 Anemia in chronic kidney disease: Secondary | ICD-10-CM | POA: Diagnosis present

## 2021-08-21 DIAGNOSIS — R471 Dysarthria and anarthria: Secondary | ICD-10-CM | POA: Diagnosis not present

## 2021-08-21 DIAGNOSIS — I482 Chronic atrial fibrillation, unspecified: Secondary | ICD-10-CM

## 2021-08-21 DIAGNOSIS — R569 Unspecified convulsions: Secondary | ICD-10-CM | POA: Insufficient documentation

## 2021-08-21 DIAGNOSIS — I129 Hypertensive chronic kidney disease with stage 1 through stage 4 chronic kidney disease, or unspecified chronic kidney disease: Secondary | ICD-10-CM | POA: Diagnosis present

## 2021-08-21 DIAGNOSIS — J9600 Acute respiratory failure, unspecified whether with hypoxia or hypercapnia: Secondary | ICD-10-CM

## 2021-08-21 DIAGNOSIS — G40901 Epilepsy, unspecified, not intractable, with status epilepticus: Secondary | ICD-10-CM | POA: Diagnosis not present

## 2021-08-21 DIAGNOSIS — N1831 Chronic kidney disease, stage 3a: Secondary | ICD-10-CM | POA: Diagnosis present

## 2021-08-21 DIAGNOSIS — Z7901 Long term (current) use of anticoagulants: Secondary | ICD-10-CM | POA: Insufficient documentation

## 2021-08-21 DIAGNOSIS — J441 Chronic obstructive pulmonary disease with (acute) exacerbation: Secondary | ICD-10-CM | POA: Insufficient documentation

## 2021-08-21 DIAGNOSIS — Z7951 Long term (current) use of inhaled steroids: Secondary | ICD-10-CM | POA: Diagnosis not present

## 2021-08-21 DIAGNOSIS — J982 Interstitial emphysema: Secondary | ICD-10-CM | POA: Diagnosis present

## 2021-08-21 DIAGNOSIS — I69318 Other symptoms and signs involving cognitive functions following cerebral infarction: Secondary | ICD-10-CM | POA: Diagnosis not present

## 2021-08-21 DIAGNOSIS — Z79899 Other long term (current) drug therapy: Secondary | ICD-10-CM

## 2021-08-21 DIAGNOSIS — N183 Chronic kidney disease, stage 3 unspecified: Secondary | ICD-10-CM

## 2021-08-21 DIAGNOSIS — R791 Abnormal coagulation profile: Secondary | ICD-10-CM | POA: Diagnosis not present

## 2021-08-21 DIAGNOSIS — T17500A Unspecified foreign body in bronchus causing asphyxiation, initial encounter: Secondary | ICD-10-CM

## 2021-08-21 DIAGNOSIS — Z7982 Long term (current) use of aspirin: Secondary | ICD-10-CM

## 2021-08-21 DIAGNOSIS — Z880 Allergy status to penicillin: Secondary | ICD-10-CM

## 2021-08-21 LAB — CBC WITH DIFFERENTIAL/PLATELET
Abs Immature Granulocytes: 0.01 10*3/uL (ref 0.00–0.07)
Basophils Absolute: 0 10*3/uL (ref 0.0–0.1)
Basophils Relative: 1 %
Eosinophils Absolute: 0.3 10*3/uL (ref 0.0–0.5)
Eosinophils Relative: 6 %
HCT: 36.7 % — ABNORMAL LOW (ref 39.0–52.0)
Hemoglobin: 11.4 g/dL — ABNORMAL LOW (ref 13.0–17.0)
Immature Granulocytes: 0 %
Lymphocytes Relative: 10 %
Lymphs Abs: 0.5 10*3/uL — ABNORMAL LOW (ref 0.7–4.0)
MCH: 25.3 pg — ABNORMAL LOW (ref 26.0–34.0)
MCHC: 31.1 g/dL (ref 30.0–36.0)
MCV: 81.6 fL (ref 80.0–100.0)
Monocytes Absolute: 0.2 10*3/uL (ref 0.1–1.0)
Monocytes Relative: 3 %
Neutro Abs: 4.2 10*3/uL (ref 1.7–7.7)
Neutrophils Relative %: 80 %
Platelets: 222 10*3/uL (ref 150–400)
RBC: 4.5 MIL/uL (ref 4.22–5.81)
RDW: 14.6 % (ref 11.5–15.5)
WBC: 5.2 10*3/uL (ref 4.0–10.5)
nRBC: 0 % (ref 0.0–0.2)

## 2021-08-21 LAB — I-STAT ARTERIAL BLOOD GAS, ED
Acid-base deficit: 1 mmol/L (ref 0.0–2.0)
Bicarbonate: 21.4 mmol/L (ref 20.0–28.0)
Calcium, Ion: 1.2 mmol/L (ref 1.15–1.40)
HCT: 38 % — ABNORMAL LOW (ref 39.0–52.0)
Hemoglobin: 12.9 g/dL — ABNORMAL LOW (ref 13.0–17.0)
O2 Saturation: 99 %
Patient temperature: 98.6
Potassium: 3 mmol/L — ABNORMAL LOW (ref 3.5–5.1)
Sodium: 140 mmol/L (ref 135–145)
TCO2: 22 mmol/L (ref 22–32)
pCO2 arterial: 27.8 mmHg — ABNORMAL LOW (ref 32–48)
pH, Arterial: 7.495 — ABNORMAL HIGH (ref 7.35–7.45)
pO2, Arterial: 126 mmHg — ABNORMAL HIGH (ref 83–108)

## 2021-08-21 LAB — BLOOD GAS, ARTERIAL
Acid-Base Excess: 4.8 mmol/L — ABNORMAL HIGH (ref 0.0–2.0)
Acid-Base Excess: 3.4 mmol/L — ABNORMAL HIGH (ref 0.0–2.0)
Bicarbonate: 29.9 mmol/L — ABNORMAL HIGH (ref 20.0–28.0)
Bicarbonate: 28.5 mmol/L — ABNORMAL HIGH (ref 20.0–28.0)
FIO2: 100 %
FIO2: 80 %
MECHVT: 550 mL
MECHVT: 550 mL
Mechanical Rate: 14
Mechanical Rate: 14
O2 Saturation: 95.8 %
O2 Saturation: 100 %
PEEP: 12 cmH2O
PEEP: 12 cmH2O
Patient temperature: 37
Patient temperature: 37
pCO2 arterial: 45 mmHg (ref 32–48)
pCO2 arterial: 44 mmHg (ref 32–48)
pH, Arterial: 7.43 (ref 7.35–7.45)
pH, Arterial: 7.42 (ref 7.35–7.45)
pO2, Arterial: 69 mmHg — ABNORMAL LOW (ref 83–108)
pO2, Arterial: 111 mmHg — ABNORMAL HIGH (ref 83–108)

## 2021-08-21 LAB — ETHANOL: Alcohol, Ethyl (B): <10 mg/dL (ref <10)

## 2021-08-21 LAB — COMPREHENSIVE METABOLIC PANEL
ALT: 14 U/L (ref 0–44)
AST: 22 U/L (ref 15–41)
Albumin: 3.8 g/dL (ref 3.5–5.0)
Alkaline Phosphatase: 77 U/L (ref 38–126)
Anion gap: 7 (ref 5–15)
BUN: 14 mg/dL (ref 8–23)
CO2: 27 mmol/L (ref 22–32)
Calcium: 9.1 mg/dL (ref 8.9–10.3)
Chloride: 106 mmol/L (ref 98–111)
Creatinine, Ser: 1.52 mg/dL — ABNORMAL HIGH (ref 0.61–1.24)
GFR, Estimated: 51 mL/min — ABNORMAL LOW (ref >60)
Glucose, Bld: 107 mg/dL — ABNORMAL HIGH (ref 70–99)
Potassium: 3.4 mmol/L — ABNORMAL LOW (ref 3.5–5.1)
Sodium: 140 mmol/L (ref 135–145)
Total Bilirubin: 0.5 mg/dL (ref 0.3–1.2)
Total Protein: 7.1 g/dL (ref 6.5–8.1)

## 2021-08-21 LAB — SARS CORONAVIRUS 2 BY RT PCR: SARS Coronavirus 2 by RT PCR: NEGATIVE

## 2021-08-21 LAB — GLUCOSE, CAPILLARY
Glucose-Capillary: 157 mg/dL — ABNORMAL HIGH (ref 70–99)
Glucose-Capillary: 163 mg/dL — ABNORMAL HIGH (ref 70–99)

## 2021-08-21 LAB — PROTIME-INR
INR: 1.7 — ABNORMAL HIGH (ref 0.8–1.2)
Prothrombin Time: 19.6 seconds — ABNORMAL HIGH (ref 11.4–15.2)

## 2021-08-21 LAB — LACTIC ACID, PLASMA
Lactic Acid, Venous: 3.7 mmol/L (ref 0.5–1.9)
Lactic Acid, Venous: 3.2 mmol/L (ref 0.5–1.9)

## 2021-08-21 LAB — CBG MONITORING, ED
Glucose-Capillary: 123 mg/dL — ABNORMAL HIGH (ref 70–99)
Glucose-Capillary: 140 mg/dL — ABNORMAL HIGH (ref 70–99)

## 2021-08-21 LAB — MAGNESIUM
Magnesium: 1.9 mg/dL (ref 1.7–2.4)
Magnesium: 1.7 mg/dL (ref 1.7–2.4)

## 2021-08-21 LAB — TROPONIN I (HIGH SENSITIVITY)
Troponin I (High Sensitivity): 28 ng/L — ABNORMAL HIGH (ref <18)
Troponin I (High Sensitivity): 48 ng/L — ABNORMAL HIGH (ref <18)

## 2021-08-21 LAB — HIV ANTIBODY (ROUTINE TESTING W REFLEX): HIV Screen 4th Generation wRfx: NONREACTIVE

## 2021-08-21 LAB — BRAIN NATRIURETIC PEPTIDE: B Natriuretic Peptide: 365.3 pg/mL — ABNORMAL HIGH (ref 0.0–100.0)

## 2021-08-21 LAB — PHOSPHORUS: Phosphorus: 1.4 mg/dL — ABNORMAL LOW (ref 2.5–4.6)

## 2021-08-21 MED ORDER — ORAL CARE MOUTH RINSE: 15.0000 mL | OROMUCOSAL | Status: DC | PRN

## 2021-08-21 MED ORDER — LEVETIRACETAM IN NACL 1000 MG/100ML IV SOLN
1000.0000 mg | Freq: Once | INTRAVENOUS | Status: AC
  Administered 2021-08-21: 1000 mg via INTRAVENOUS
  Filled 2021-08-21: qty 100

## 2021-08-21 MED ORDER — SODIUM CHLORIDE 0.9 % IV BOLUS
1000.0000 mL | Freq: Once | INTRAVENOUS | Status: AC
  Administered 2021-08-21: 1000 mL via INTRAVENOUS

## 2021-08-21 MED ORDER — ONDANSETRON HCL 4 MG/2ML IJ SOLN: 4.0000 mg | Freq: Four times a day (QID) | INTRAMUSCULAR | Status: DC | PRN

## 2021-08-21 MED ORDER — VITAL HIGH PROTEIN PO LIQD
1000.0000 mL | ORAL | Status: DC
  Administered 2021-08-22 – 2021-08-23 (×2): 1000 mL

## 2021-08-21 MED ORDER — LORAZEPAM 2 MG/ML IJ SOLN
2.0000 mg | Freq: Once | INTRAMUSCULAR | Status: AC
  Administered 2021-08-21: 2 mg via INTRAVENOUS

## 2021-08-21 MED ORDER — NOREPINEPHRINE 4 MG/250ML-% IV SOLN
INTRAVENOUS | Status: AC
  Administered 2021-08-21: 5 ug/min via INTRAVENOUS
  Filled 2021-08-21: qty 250

## 2021-08-21 MED ORDER — ASPIRIN 81 MG PO CHEW
81.0000 mg | CHEWABLE_TABLET | Freq: Every day | ORAL | Status: DC
  Administered 2021-08-22 – 2021-08-23 (×2): 81 mg
  Filled 2021-08-21 (×2): qty 1

## 2021-08-21 MED ORDER — LEVETIRACETAM IN NACL 1500 MG/100ML IV SOLN
1500.0000 mg | Freq: Two times a day (BID) | INTRAVENOUS | Status: DC
  Administered 2021-08-21 – 2021-08-24 (×4): 1500 mg via INTRAVENOUS
  Filled 2021-08-21 (×6): qty 100

## 2021-08-21 MED ORDER — IPRATROPIUM-ALBUTEROL 0.5-2.5 (3) MG/3ML IN SOLN
3.0000 mL | RESPIRATORY_TRACT | Status: DC
  Administered 2021-08-21 (×2): 3 mL via RESPIRATORY_TRACT
  Filled 2021-08-21 (×2): qty 3

## 2021-08-21 MED ORDER — FENTANYL CITRATE PF 50 MCG/ML IJ SOSY
50.0000 ug | PREFILLED_SYRINGE | INTRAMUSCULAR | Status: DC | PRN
  Administered 2021-08-22: 50 ug via INTRAVENOUS
  Filled 2021-08-21: qty 1

## 2021-08-21 MED ORDER — ETOMIDATE 2 MG/ML IV SOLN
INTRAVENOUS | Status: AC | PRN
  Administered 2021-08-21: 20 mg via INTRAVENOUS

## 2021-08-21 MED ORDER — DOCUSATE SODIUM 50 MG/5ML PO LIQD
100.0000 mg | Freq: Two times a day (BID) | ORAL | Status: DC
  Administered 2021-08-22 – 2021-08-23 (×3): 100 mg
  Filled 2021-08-21 (×4): qty 10

## 2021-08-21 MED ORDER — POLYETHYLENE GLYCOL 3350 17 G PO PACK
17.0000 g | PACK | Freq: Every day | ORAL | Status: DC
  Administered 2021-08-22 – 2021-08-23 (×2): 17 g
  Filled 2021-08-21 (×2): qty 1

## 2021-08-21 MED ORDER — HYDRALAZINE HCL 20 MG/ML IJ SOLN
INTRAMUSCULAR | Status: AC
  Filled 2021-08-21: qty 1

## 2021-08-21 MED ORDER — ATORVASTATIN CALCIUM 40 MG PO TABS
80.0000 mg | ORAL_TABLET | Freq: Every day | ORAL | Status: DC
Start: 2021-08-22 — End: 2021-08-24
  Administered 2021-08-22 – 2021-08-23 (×2): 80 mg
  Filled 2021-08-21 (×2): qty 2

## 2021-08-21 MED ORDER — EPINEPHRINE 0.3 MG/0.3ML IJ SOAJ
INTRAMUSCULAR | Status: AC
  Filled 2021-08-21: qty 0.3

## 2021-08-21 MED ORDER — WARFARIN SODIUM 7.5 MG PO TABS
7.5000 mg | ORAL_TABLET | Freq: Once | ORAL | Status: DC
  Filled 2021-08-21: qty 1

## 2021-08-21 MED ORDER — LORAZEPAM 2 MG/ML IJ SOLN
4.0000 mg | Freq: Once | INTRAMUSCULAR | Status: AC
  Administered 2021-08-21: 4 mg via INTRAVENOUS

## 2021-08-21 MED ORDER — METHYLPREDNISOLONE SODIUM SUCC 40 MG IJ SOLR: 40.0000 mg | Freq: Two times a day (BID) | INTRAMUSCULAR | Status: DC

## 2021-08-21 MED ORDER — DOCUSATE SODIUM 100 MG PO CAPS: 100.0000 mg | ORAL_CAPSULE | Freq: Two times a day (BID) | ORAL | Status: DC | PRN

## 2021-08-21 MED ORDER — PROSOURCE TF20 ENFIT COMPATIBL EN LIQD
60.0000 mL | Freq: Every day | ENTERAL | Status: DC
  Administered 2021-08-22 – 2021-08-23 (×2): 60 mL
  Filled 2021-08-21 (×3): qty 60

## 2021-08-21 MED ORDER — ARFORMOTEROL TARTRATE 15 MCG/2ML IN NEBU
15.0000 ug | INHALATION_SOLUTION | Freq: Two times a day (BID) | RESPIRATORY_TRACT | Status: DC
  Administered 2021-08-21 – 2021-08-28 (×14): 15 ug via RESPIRATORY_TRACT
  Filled 2021-08-21 (×14): qty 2

## 2021-08-21 MED ORDER — LEVETIRACETAM IN NACL 500 MG/100ML IV SOLN
500.0000 mg | Freq: Once | INTRAVENOUS | Status: AC
  Administered 2021-08-21: 500 mg via INTRAVENOUS
  Filled 2021-08-21: qty 100

## 2021-08-21 MED ORDER — PIPERACILLIN-TAZOBACTAM 3.375 G IVPB 30 MIN
3.3750 g | Freq: Once | INTRAVENOUS | Status: AC
  Administered 2021-08-21: 3.375 g via INTRAVENOUS
  Filled 2021-08-21: qty 50

## 2021-08-21 MED ORDER — LEVETIRACETAM IN NACL 1000 MG/100ML IV SOLN
1000.0000 mg | Freq: Once | INTRAVENOUS | Status: AC
  Administered 2021-08-21: 1000 mg via INTRAVENOUS
  Filled 2021-08-21 (×2): qty 100

## 2021-08-21 MED ORDER — POTASSIUM CHLORIDE 20 MEQ PO PACK
20.0000 meq | PACK | Freq: Once | ORAL | Status: DC
  Filled 2021-08-21: qty 1

## 2021-08-21 MED ORDER — HYDRALAZINE HCL 20 MG/ML IJ SOLN
20.0000 mg | Freq: Once | INTRAMUSCULAR | Status: AC
  Administered 2021-08-21: 20 mg via INTRAVENOUS

## 2021-08-21 MED ORDER — ACETAMINOPHEN 325 MG PO TABS: 650.0000 mg | ORAL_TABLET | ORAL | Status: DC | PRN

## 2021-08-21 MED ORDER — METHYLPREDNISOLONE SODIUM SUCC 125 MG IJ SOLR
125.0000 mg | Freq: Once | INTRAMUSCULAR | Status: AC
  Administered 2021-08-21: 125 mg via INTRAVENOUS
  Filled 2021-08-21: qty 2

## 2021-08-21 MED ORDER — PROPOFOL 1000 MG/100ML IV EMUL
0.0000 ug/kg/min | INTRAVENOUS | Status: DC
  Administered 2021-08-21: 50 ug/kg/min via INTRAVENOUS
  Administered 2021-08-22: 40 ug/kg/min via INTRAVENOUS
  Administered 2021-08-22: 50 ug/kg/min via INTRAVENOUS
  Administered 2021-08-22: 45 ug/kg/min via INTRAVENOUS
  Filled 2021-08-21 (×5): qty 100

## 2021-08-21 MED ORDER — BUDESONIDE 0.5 MG/2ML IN SUSP
0.5000 mg | Freq: Two times a day (BID) | RESPIRATORY_TRACT | Status: DC
  Administered 2021-08-21 – 2021-08-28 (×14): 0.5 mg via RESPIRATORY_TRACT
  Filled 2021-08-21 (×14): qty 2

## 2021-08-21 MED ORDER — ALBUTEROL SULFATE (2.5 MG/3ML) 0.083% IN NEBU
2.5000 mg | INHALATION_SOLUTION | Freq: Two times a day (BID) | RESPIRATORY_TRACT | Status: DC
Start: 2021-08-21 — End: 2021-08-25
  Administered 2021-08-21 – 2021-08-25 (×8): 2.5 mg via RESPIRATORY_TRACT
  Filled 2021-08-21 (×8): qty 3

## 2021-08-21 MED ORDER — POLYETHYLENE GLYCOL 3350 17 G PO PACK: 17.0000 g | PACK | Freq: Every day | ORAL | Status: DC | PRN

## 2021-08-21 MED ORDER — CHLORHEXIDINE GLUCONATE CLOTH 2 % EX PADS
6.0000 | MEDICATED_PAD | Freq: Every day | CUTANEOUS | Status: DC
Start: 2021-08-22 — End: 2021-08-25
  Administered 2021-08-22 – 2021-08-24 (×2): 6 via TOPICAL

## 2021-08-21 MED ORDER — FENTANYL CITRATE PF 50 MCG/ML IJ SOSY
50.0000 ug | PREFILLED_SYRINGE | INTRAMUSCULAR | Status: DC | PRN
  Administered 2021-08-22 (×2): 100 ug via INTRAVENOUS
  Administered 2021-08-22: 50 ug via INTRAVENOUS
  Filled 2021-08-21 (×2): qty 2
  Filled 2021-08-21: qty 1

## 2021-08-21 MED ORDER — ACETYLCYSTEINE 20 % IN SOLN
4.0000 mL | Freq: Two times a day (BID) | RESPIRATORY_TRACT | Status: DC
Start: 2021-08-21 — End: 2021-08-25
  Administered 2021-08-21 – 2021-08-25 (×8): 4 mL via RESPIRATORY_TRACT
  Filled 2021-08-21 (×8): qty 4

## 2021-08-21 MED ORDER — VALPROATE SODIUM 100 MG/ML IV SOLN
1500.0000 mg | Freq: Once | INTRAVENOUS | Status: AC
  Administered 2021-08-21: 1500 mg via INTRAVENOUS
  Filled 2021-08-21: qty 15

## 2021-08-21 MED ORDER — HEPARIN SODIUM (PORCINE) 5000 UNIT/ML IJ SOLN
5000.0000 [IU] | Freq: Three times a day (TID) | INTRAMUSCULAR | Status: DC
  Administered 2021-08-21 – 2021-08-23 (×5): 5000 [IU] via SUBCUTANEOUS
  Filled 2021-08-21 (×4): qty 1

## 2021-08-21 MED ORDER — PANTOPRAZOLE 2 MG/ML SUSPENSION: 40.0000 mg | Freq: Every day | ORAL | Status: DC

## 2021-08-21 MED ORDER — FENTANYL CITRATE PF 50 MCG/ML IJ SOSY
50.0000 ug | PREFILLED_SYRINGE | INTRAMUSCULAR | Status: DC | PRN
  Administered 2021-08-21 (×2): 50 ug via INTRAVENOUS
  Filled 2021-08-21 (×2): qty 1

## 2021-08-21 MED ORDER — VANCOMYCIN HCL 1750 MG/350ML IV SOLN
1750.0000 mg | Freq: Once | INTRAVENOUS | Status: AC
Start: 2021-08-21 — End: 2021-08-21
  Administered 2021-08-21: 1750 mg via INTRAVENOUS
  Filled 2021-08-21: qty 350

## 2021-08-21 MED ORDER — SODIUM CHLORIDE 0.9 % IV SOLN
INTRAVENOUS | Status: AC | PRN
  Administered 2021-08-21: 999 mL/h via INTRAVENOUS

## 2021-08-21 MED ORDER — ORAL CARE MOUTH RINSE
15.0000 mL | OROMUCOSAL | Status: DC
  Administered 2021-08-21 – 2021-08-22 (×6): 15 mL via OROMUCOSAL

## 2021-08-21 MED ORDER — IPRATROPIUM-ALBUTEROL 0.5-2.5 (3) MG/3ML IN SOLN
RESPIRATORY_TRACT | Status: AC
  Administered 2021-08-21: 3 mL
  Filled 2021-08-21: qty 9

## 2021-08-21 MED ORDER — LEVETIRACETAM 750 MG PO TABS
1500.0000 mg | ORAL_TABLET | Freq: Two times a day (BID) | ORAL | Status: DC
  Administered 2021-08-22 (×2): 1500 mg
  Filled 2021-08-21 (×3): qty 2

## 2021-08-21 MED ORDER — LORAZEPAM 2 MG/ML IJ SOLN
INTRAMUSCULAR | Status: AC
  Filled 2021-08-21: qty 2

## 2021-08-21 MED ORDER — PIPERACILLIN-TAZOBACTAM 3.375 G IVPB: 3.3750 g | Freq: Once | INTRAVENOUS | Status: DC

## 2021-08-21 MED ORDER — BUDESONIDE 0.5 MG/2ML IN SUSP
0.5000 mg | Freq: Two times a day (BID) | RESPIRATORY_TRACT | Status: DC
  Administered 2021-08-21: 0.5 mg via RESPIRATORY_TRACT
  Filled 2021-08-21: qty 2

## 2021-08-21 MED ORDER — DEXTROSE-NACL 5-0.45 % IV SOLN: INTRAVENOUS | Status: DC

## 2021-08-21 MED ORDER — MIDAZOLAM HCL 2 MG/2ML IJ SOLN
INTRAMUSCULAR | Status: AC
  Administered 2021-08-21: 4 mg via INTRAVENOUS
  Filled 2021-08-21: qty 4

## 2021-08-21 MED ORDER — ORAL CARE MOUTH RINSE
15.0000 mL | OROMUCOSAL | Status: DC
  Administered 2021-08-21 – 2021-08-24 (×28): 15 mL via OROMUCOSAL

## 2021-08-21 MED ORDER — ROCURONIUM BROMIDE 10 MG/ML (PF) SYRINGE
PREFILLED_SYRINGE | INTRAVENOUS | Status: AC
  Administered 2021-08-21: 50 mg via INTRAVENOUS
  Filled 2021-08-21: qty 10

## 2021-08-21 MED ORDER — FENTANYL CITRATE PF 50 MCG/ML IJ SOSY
50.0000 ug | PREFILLED_SYRINGE | INTRAMUSCULAR | Status: DC | PRN
  Administered 2021-08-21 (×2): 100 ug via INTRAVENOUS
  Administered 2021-08-21: 50 ug via INTRAVENOUS
  Filled 2021-08-21 (×2): qty 2
  Filled 2021-08-21: qty 4

## 2021-08-21 MED ORDER — SUCCINYLCHOLINE CHLORIDE 20 MG/ML IJ SOLN
INTRAMUSCULAR | Status: AC | PRN
  Administered 2021-08-21: 150 mg via INTRAVENOUS

## 2021-08-21 MED ORDER — LEVETIRACETAM IN NACL 1000 MG/100ML IV SOLN
1000.0000 mg | Freq: Once | INTRAVENOUS | Status: AC
  Administered 2021-08-21: 1000 mg via INTRAVENOUS

## 2021-08-21 MED ORDER — PROPOFOL 1000 MG/100ML IV EMUL
0.0000 ug/kg/min | INTRAVENOUS | Status: DC
  Administered 2021-08-21: 45 ug/kg/min via INTRAVENOUS
  Administered 2021-08-21: 50 ug/kg/min via INTRAVENOUS
  Administered 2021-08-21: 5 ug/kg/min via INTRAVENOUS
  Filled 2021-08-21 (×3): qty 100

## 2021-08-21 MED ORDER — WARFARIN - PHARMACIST DOSING INPATIENT: Freq: Every day | Status: DC

## 2021-08-21 MED ORDER — PANTOPRAZOLE 2 MG/ML SUSPENSION
40.0000 mg | Freq: Every day | ORAL | Status: DC
  Administered 2021-08-22 – 2021-08-23 (×2): 40 mg
  Filled 2021-08-21 (×3): qty 20

## 2021-08-21 MED ORDER — ALBUTEROL SULFATE (2.5 MG/3ML) 0.083% IN NEBU
2.5000 mg | INHALATION_SOLUTION | Freq: Once | RESPIRATORY_TRACT | Status: AC
  Administered 2021-08-21: 2.5 mg via RESPIRATORY_TRACT
  Filled 2021-08-21: qty 3

## 2021-08-21 MED ORDER — DOCUSATE SODIUM 50 MG/5ML PO LIQD: 100.0000 mg | Freq: Two times a day (BID) | ORAL | Status: DC | PRN

## 2021-08-21 NOTE — Consult Note (Addendum)
NEUROLOGY CONSULTATION NOTE   Date of service: August 21, 2021 Patient Name: Nathan Shepherd MRN:  [id number] DOB:  September 20, 1955 Reason for consult: convulsive status epilepticus Requesting physician: Dr. Shaune Pollack _ _ _   _ __   _ __ _ _  __ __   _ __   __ _  History of Present Illness   This is a 66 yo man with pmhx CHF, CKD, HTN, HL, seizure disorder who was brought in after being found unresponsive at his group home.  Last known well is unknown.  He was found to be hypoxic with EMS and actively seizing with a right gaze deviation.  He received a total of Versed 7.5 mg in route and remained seizing with right gaze deviation when he arrived.  He was not able to respond to EMS throughout that time.  He was intubated for hypoxic respiratory failure and airway protection.  Shortly after he desatted due to mucous plugging and underwent bronchoscopy with critical care. Patient is on warfarin; head CT wo showed NAICP. PTA patient was on keppra 1500mg  bid.   ROS   UTA 2/2 sedation  Past History   I have reviewed the following:  Past Medical History:  Diagnosis Date   CHF (congestive heart failure) (HCC)    CKD (chronic kidney disease) stage 3, GFR 30-59 ml/min (HCC)    Edema    Hyperlipemia    Hypertension    Seizures (HCC)    History reviewed. No pertinent surgical history. No family history on file. Social History   Socioeconomic History   Marital status: Single    Spouse name: Not on file   Number of children: Not on file   Years of education: Not on file   Highest education level: Not on file  Occupational History   Not on file  Tobacco Use   Smoking status: Never   Smokeless tobacco: Never  Substance and Sexual Activity   Alcohol use: Not on file   Drug use: Not on file   Sexual activity: Not on file  Other Topics Concern   Not on file  Social History Narrative   Not on file   Social Determinants of Health   Financial Resource Strain: Not on file  Food  Insecurity: Not on file  Transportation Needs: Not on file  Physical Activity: Not on file  Stress: Not on file  Social Connections: Not on file   Allergies  Allergen Reactions   Penicillins Hives and Other (See Comments)    Medications   (Not in a hospital admission)     Current Facility-Administered Medications:    0.9 %  sodium chloride infusion, , Intravenous, Continuous PRN, , MD, Last Rate: 999 mL/hr at 08/21/21 0902, 999 mL/hr at 08/21/21 0902   budesonide (PULMICORT) nebulizer solution 0.5 mg, 0.5 mg, Nebulization, BID, Kasa, Kurian, MD   fentaNYL (SUBLIMAZE) injection 50 mcg, 50 mcg, Intravenous, Q15 min PRN, 08/23/21, MD, 50 mcg at 08/21/21 0852   fentaNYL (SUBLIMAZE) injection 50-200 mcg, 50-200 mcg, Intravenous, Q30 min PRN, 08/23/21, MD, 100 mcg at 08/21/21 0929   ipratropium-albuterol (DUONEB) 0.5-2.5 (3) MG/3ML nebulizer solution 3 mL, 3 mL, Nebulization, Q4H, Kasa, Kurian, MD   levETIRAcetam (KEPPRA) IVPB 1000 mg/100 mL premix, 1,000 mg, Intravenous, Once, 08/23/21, MD   levETIRAcetam (KEPPRA) IVPB 500 mg/100 mL premix, 500 mg, Intravenous, Once, Shaune Pollack, MD   methylPREDNISolone sodium succinate (SOLU-MEDROL) 125 mg/2 mL injection 125 mg, 125 mg, Intravenous, Once, Isaacs,  Sheria Lang, MD   methylPREDNISolone sodium succinate (SOLU-MEDROL) 40 mg/mL injection 40 mg, 40 mg, Intravenous, Q12H, Kasa, Kurian, MD   piperacillin-tazobactam (ZOSYN) IVPB 3.375 g, 3.375 g, Intravenous, Once, Ardis Rowan, Walid A, RPH   propofol (DIPRIVAN) 1000 MG/100ML infusion, 0-50 mcg/kg/min, Intravenous, Continuous, Shaune Pollack, MD, Last Rate: 18.35 mL/hr at 08/21/21 0922, 35 mcg/kg/min at 08/21/21 0922   vancomycin (VANCOREADY) IVPB 1750 mg/350 mL, 1,750 mg, Intravenous, Once, Coulter, [place], [facility]  Current Outpatient Medications:    acetaminophen (TYLENOL) 325 MG tablet, Take by mouth., Disp: , Rfl:    aspirin 81 MG chewable tablet, Chew by mouth.,  Disp: , Rfl:    aspirin EC 81 MG tablet, Take 81 mg by mouth daily., Disp: , Rfl:    atorvastatin (LIPITOR) 80 MG tablet, Take by mouth., Disp: , Rfl:    budesonide-formoterol (SYMBICORT) 160-4.5 MCG/ACT inhaler, Inhale into the lungs., Disp: , Rfl:    carvedilol (COREG) 12.5 MG tablet, Take 12.5 mg by mouth 2 (two) times daily with a meal., Disp: , Rfl:    Docusate Sodium (DSS) 100 MG CAPS, Take by mouth., Disp: , Rfl:    donepezil (ARICEPT) 5 MG tablet, Take 5 mg by mouth at bedtime., Disp: , Rfl:    furosemide (LASIX) 20 MG tablet, Take by mouth., Disp: , Rfl:    levETIRAcetam (KEPPRA) 1000 MG tablet, Take 1,000 mg by mouth 2 (two) times daily., Disp: , Rfl:    levETIRAcetam (KEPPRA) 500 MG tablet, Take 1,000 mg by mouth 2 (two) times daily. , Disp: , Rfl:    losartan (COZAAR) 50 MG tablet, Take 100 mg by mouth daily. , Disp: , Rfl:    losartan (COZAAR) 50 MG tablet, Take by mouth., Disp: , Rfl:    NIFEdipine (PROCARDIA XL/NIFEDICAL XL) 60 MG 24 hr tablet, Take 60 mg by mouth daily., Disp: , Rfl:    NIFEdipine (PROCARDIA-XL/ADALAT CC) 60 MG 24 hr tablet, Take 120 mg by mouth daily. , Disp: , Rfl:    rosuvastatin (CRESTOR) 20 MG tablet, Take 20 mg by mouth at bedtime. , Disp: , Rfl:    Skin Protectants, Misc. (EUCERIN) cream, Apply 1 application topically See admin instructions. Apply to legs and feet once daily, Disp: , Rfl:    torsemide (DEMADEX) 20 MG tablet, Take 20 mg by mouth daily., Disp: , Rfl:    Vitamin D, Ergocalciferol, (DRISDOL) 1.25 MG (50000 UNIT) CAPS capsule, Take by mouth., Disp: , Rfl:    warfarin (COUMADIN) 7.5 MG tablet, Take 7.5 mg by mouth daily., Disp: , Rfl:   Vitals   Vitals:   08/21/21 1000 08/21/21 1003 08/21/21 1006 08/21/21 1009  BP: (!) 189/112 (!) 181/97 (!) 178/106 (!) 176/106  Pulse: 75 70 70 (!) 59  Resp: (!) 23 (!) 23 18 19   Temp:      TempSrc:      SpO2: (!) 84% (!) 86% (!) 86% 90%  Weight:      Height:         Body mass index is 23.46  kg/m.  Physical Exam   Patient is not stable for wakeup exam  Physical Exam Gen: comatose, sedated Resp: ventilated CV: RRR  Neuro: *MS: comatose, sedated, does not respond to noxious stimuli *Speech: intubated *CN: pupils pinpoint and sluggishly reactive, (-) corneals, oculocephalics, cough, gag, face symmetric at rest *Motor & sensory: no response to noxious stimuli *Reflexes: 1+ symm throughout, toes mute   Labs   CBC:  Recent Labs  Lab 08/21/21 [phone]  WBC 5.2  NEUTROABS 4.2  HGB 11.4*  HCT 36.7*  MCV 81.6  PLT [id number]    Basic Metabolic Panel:  Lab Results  Component Value Date   NA 140 08/21/2021   K 3.4 (L) 08/21/2021   CO2 27 08/21/2021   GLUCOSE 107 (H) 08/21/2021   BUN 14 08/21/2021   CREATININE 1.52 (H) 08/21/2021   CALCIUM 9.1 08/21/2021   GFRNONAA 51 (L) 08/21/2021   GFRAA 59 (L) 12/15/2016   Lipid Panel:  Lab Results  Component Value Date   LDLCALC 37 08/18/2016   HgbA1c:  Lab Results  Component Value Date   HGBA1C 6.4 (H) 08/18/2016   Urine Drug Screen:     Component Value Date/Time   LABOPIA NONE DETECTED 08/16/2016 2254   COCAINSCRNUR NONE DETECTED 08/16/2016 2254   LABBENZ NONE DETECTED 08/16/2016 2254   AMPHETMU NONE DETECTED 08/16/2016 2254   THCU NONE DETECTED 08/16/2016 2254   LABBARB NONE DETECTED 08/16/2016 2254    Alcohol Level     Component Value Date/Time   ETH <10 08/21/2021 [id number]     Impression   This is a 66 yo man with pmhx CHF, CKD, HTN, HL, seizure disorder BIB EMS after being found seizing and unresponsive at his group home. He was still seizing on arrival to ED and was intubated for hypoxic respiratory failure. He further desatted and underwent bronch. He is not stable for exam off sedation; on sedation he has pinpoint pupils, no brainstem reflexes and no response to noxious stimuli. Head CT wo contrast NAICP. Patient will require transfer [facility] for cEEG.  Recommendations   - Transfer to [facility] 4N neuro ICU under  CCM; neurology will consult - S/p keppra load 60mg /kg, continue 1500mg  q 12 hrs - Sedation with versed or propofol - On arrival to [facility] patient will need STAT EEG f/b cEEG, likely with MR compatible leads - Further guidance from [facility] neurohospitalist based on EEG findings - Seizure precautions  Please notify [facility] neurohospitalist when patient arrives  This patient is critically ill and at significant risk of neurological worsening, death and care requires constant monitoring of vital signs, hemodynamics,respiratory and cardiac monitoring, neurological assessment, discussion with family, other specialists and medical decision making of high complexity. I spent 65 minutes of neurocritical care time  in the care of  this patient. This was time spent independent of any time provided by nurse practitioner or PA.  Su Monks, MD [facility] [phone]  If 7pm- 7am, please page neurology on call as listed in [place].

## 2021-08-21 NOTE — ED Notes (Signed)
Attempted to call [facility] x3 to give report. No answer. [phone]

## 2021-08-21 NOTE — Progress Notes (Signed)
Went to bedside to place EEG, and RN informed tech that pt is moving to [phone]

## 2021-08-21 NOTE — ED Notes (Signed)
Pt Guardian information: Nathan Shepherd) [phone]

## 2021-08-21 NOTE — Consult Note (Signed)
NAME:  Nathan Shepherd, MRN:  [id number], DOB:  1955-11-19, LOS: 0  CONSULTATION DATE:  08/21/2021  REFERRING MD:  Erma Heritage CHIEF COMPLAINT:  SEVERE HYPOXIA    History of Present Illness:  66 y.o. male   here with status epilepticus.   was picked up at a group home after being found unresponsive.  Pt was found by EMS with stable vitals, but hypoxic, actively seizing with rightward gaze deviation.  Pt given a total of versed 7.5 mg en route and remains seizing. He has not responded at all to EMS. Remainder of history limited 2/2 seizing on arrival  Patient was emergently  intubated, placed on VENT  PCCM called for emergent consultation for severe hypoxia Fio2 at 100%, PEEP at 12 O2 sats around 60's  CXR shows RT lung opacity likely from mucus plugs    EMERGENT BRONCHOSCOPY PERFORMED AT BEDSIDE IN ER EXTENSIVE MUCUS PLUGS REMOVED FROM RT LUNG  I STARTED TO BAG MASK VENTILATE PATIENT AND PATIENT HAS VERY LONG EXPIRATORY PHASE INDICATIVE OF UNDERLYING COPD/ASTHMA  PATIENT WAS BAGGED SEVERAL TIMES, THEN REPLACED BACK ON MV SUPPORT WITH DECREASED RR  OXYGEN SATS IMPROVED TO 97%  Significant Hospital Events: Including procedures, antibiotic start and stop dates in addition to other pertinent events   8/19 emergent intubation, emergent BRONCHOSCOPY     Micro Data:  COVID NEG  Antimicrobials:   Antibiotics Given (last 72 hours)     None             Objective   Blood pressure (!) 154/96, pulse 68, temperature 98.5 F (36.9 C), temperature source Rectal, resp. rate (!) 22, height 6\' 4"  (1.93 m), weight 87.4 kg, SpO2 (!) 73 %.    Vent Mode: AC FiO2 (%):  [100 %] 100 % Set Rate:  [18 bmp] 18 bmp Vt Set:  [550 mL] 550 mL PEEP:  [5 cmH20] 5 cmH20   Intake/Output Summary (Last 24 hours) at 08/21/2021 0947 Last data filed at 08/21/2021 08/23/2021 Gross per 24 hour  Intake 200 ml  Output --  Net 200 ml   Filed Weights   08/21/21 0810  Weight: 87.4 kg       REVIEW OF SYSTEMS  PATIENT IS UNABLE TO PROVIDE COMPLETE REVIEW OF SYSTEMS DUE TO SEVERE CRITICAL ILLNESS AND  METABOLIC ENCEPHALOPATHY   PHYSICAL EXAMINATION:  GENERAL:critically ill appearing, +resp distress EYES: Pupils equal, round, reactive to light.  No scleral icterus.  MOUTH: Moist mucosal membrane. INTUBATED NECK: Supple.  PULMONARY: +rhonchi, +wheezing CARDIOVASCULAR: S1 and S2.  No murmurs  GASTROINTESTINAL: Soft, nontender, -distended. Positive bowel sounds.  MUSCULOSKELETAL: No swelling, clubbing, or edema.  NEUROLOGIC: obtunded SKIN:intact,warm,dry    Labs/imaging that I havepersonally reviewed  (right click and "Reselect all SmartList Selections" daily)     ASSESSMENT AND PLAN SYNOPSIS  66 yo AAM with severe Hypoxic resp failure due to aspiration of mucus plugs with severe encephalopathy due to STATUS EPILEPTICUS  Severe ACUTE Hypoxic and Hypercapnic Respiratory Failure -continue Mechanical Ventilator support -continue Bronchodilator Therapy -Wean Fio2 and PEEP as tolerated -VAP/VENT bundle implementation  Vent Mode: AC FiO2 (%):  [100 %] 100 % Set Rate:  [18 bmp] 18 bmp Vt Set:  [550 mL] 550 mL PEEP:  [5 cmH20] 5 cmH20   SEVERE COPD EXACERBATION -continue IV steroids as prescribed -continue NEB THERAPY as prescribed -morphine as needed -wean fio2 as needed and tolerated   CARDIAC ICU monitoring   ACUTE KIDNEY INJURY/Renal Failure -continue Foley Catheter-assess need -Avoid nephrotoxic agents -  Follow urine output, BMP -Ensure adequate renal perfusion, optimize oxygenation -Renal dose medications   Intake/Output Summary (Last 24 hours) at 08/21/2021 0947 Last data filed at 08/21/2021 [id number] Gross per 24 hour  Intake 200 ml  Output --  Net 200 ml      Latest Ref Rng & Units 08/21/2021    8:12 AM 12/15/2016    1:45 PM 08/19/2016    5:28 AM  BMP  Glucose 70 - 99 mg/dL 107  157  108   BUN 8 - 23 mg/dL 14  12  18    Creatinine  0.61 - 1.24 mg/dL 1.52  1.46  1.76   Sodium 135 - 145 mmol/L 140  138  141   Potassium 3.5 - 5.1 mmol/L 3.4  4.1  3.1   Chloride 98 - 111 mmol/L 106  100  104   CO2 22 - 32 mmol/L 27  31  30    Calcium 8.9 - 10.3 mg/dL 9.1  8.9  8.9       NEUROLOGY Acute  metabolic encephalopathy STATUS EPILEPTICUS NEUROLOGY CONSULTED PATIENT NEEDS CONTINUOUS EEG MONITORING TO BE TRANSFERRED TO [facility]     SHOCK SOURCE-HYPOVOUMIC -use vasopressors to keep MAP>65 as needed -aggressive IV fluid resuscitation   ENDO - ICU hypoglycemic\Hyperglycemia protocol -check FSBS per protocol   GI GI PROPHYLAXIS as indicated  NUTRITIONAL STATUS DIET-->NPO Constipation protocol as indicated   ELECTROLYTES -follow labs as needed -replace as needed -pharmacy consultation and following   ACUTE ANEMIA- TRANSFUSE AS NEEDED CONSIDER TRANSFUSION  IF HGB<7      Best practice (right click and "Reselect all SmartList Selections" daily)  Diet: NPO Pain/Anxiety/Delirium protocol (if indicated): Yes (RASS goal -2) Mobility:  bed rest  Code Status:  FULL Disposition:ICU  Labs   CBC: Recent Labs  Lab 08/21/21 0812  WBC 5.2  NEUTROABS 4.2  HGB 11.4*  HCT 36.7*  MCV 81.6  PLT [id number]    Basic Metabolic Panel: Recent Labs  Lab 08/21/21 0812  NA 140  K 3.4*  CL 106  CO2 27  GLUCOSE 107*  BUN 14  CREATININE 1.52*  CALCIUM 9.1  MG 1.9   GFR: Estimated Creatinine Clearance: 61.1 mL/min (A) (by C-G formula based on SCr of 1.52 mg/dL (H)). Recent Labs  Lab 08/21/21 0812  WBC 5.2  LATICACIDVEN 3.7*    Liver Function Tests: Recent Labs  Lab 08/21/21 0812  AST 22  ALT 14  ALKPHOS 77  BILITOT 0.5  PROT 7.1  ALBUMIN 3.8   No results for input(s): "LIPASE", "AMYLASE" in the last 168 hours. No results for input(s): "AMMONIA" in the last 168 hours.  ABG    Component Value Date/Time   PHART 7.425 12/15/2016 1652   PCO2ART 45.2 12/15/2016 1652   PO2ART 462 (H) 12/15/2016 1652    HCO3 28.4 (H) 12/15/2016 1652   TCO2 33 08/16/2016 2303   O2SAT 99.7 12/15/2016 1652     Coagulation Profile: Recent Labs  Lab 08/21/21 0812  INR 1.7*    Cardiac Enzymes: No results for input(s): "CKTOTAL", "CKMB", "CKMBINDEX", "TROPONINI" in the last 168 hours.  HbA1C: Hgb A1c MFr Bld  Date/Time Value Ref Range Status  08/18/2016 02:35 AM 6.4 (H) 4.8 - 5.6 % Final    Comment:    (NOTE) Pre diabetes:          5.7%-6.4% Diabetes:              >6.4% Glycemic control for   <7.0% adults with diabetes  CBG: Recent Labs  Lab 08/21/21 0900  GLUCAP 123*     Past Medical History:  He,  has a past medical history of CHF (congestive heart failure) ([place]), CKD (chronic kidney disease) stage 3, GFR 30-59 ml/min ([place]), Edema, Hyperlipemia, Hypertension, and Seizures ([place]).   Surgical History:  History reviewed. No pertinent surgical history.   Social History:   reports that he has never smoked. He has never used smokeless tobacco.   Family History:  His family history is not on file.   Allergies Allergies  Allergen Reactions   Penicillins Hives and Other (See Comments)     Home Medications  Prior to Admission medications   Medication Sig Start Date End Date Taking? Authorizing Provider  acetaminophen (TYLENOL) 325 MG tablet Take by mouth. 12/08/20   [provider]  aspirin 81 MG chewable tablet Chew by mouth.    [provider]  aspirin EC 81 MG tablet Take 81 mg by mouth daily.    [provider]  atorvastatin (LIPITOR) 80 MG tablet Take by mouth.    [provider]  budesonide-formoterol (SYMBICORT) 160-4.5 MCG/ACT inhaler Inhale into the lungs.    [provider]  carvedilol (COREG) 12.5 MG tablet Take 12.5 mg by mouth 2 (two) times daily with a meal.    [provider]  Docusate Sodium (DSS) 100 MG CAPS Take by mouth.    [provider]  donepezil (ARICEPT) 5 MG tablet Take 5 mg by mouth at  bedtime.    [provider]  furosemide (LASIX) 20 MG tablet Take by mouth.    [provider]  levETIRAcetam (KEPPRA) 1000 MG tablet Take 1,000 mg by mouth 2 (two) times daily. 01/04/21   [provider]  levETIRAcetam (KEPPRA) 500 MG tablet Take 1,000 mg by mouth 2 (two) times daily.     [provider]  losartan (COZAAR) 50 MG tablet Take 100 mg by mouth daily.     [provider]  losartan (COZAAR) 50 MG tablet Take by mouth.    [provider]  NIFEdipine (PROCARDIA XL/NIFEDICAL XL) 60 MG 24 hr tablet Take 60 mg by mouth daily. 01/04/21   [provider]  NIFEdipine (PROCARDIA-XL/ADALAT CC) 60 MG 24 hr tablet Take 120 mg by mouth daily.     [provider]  rosuvastatin (CRESTOR) 20 MG tablet Take 20 mg by mouth at bedtime.     [provider]  Skin Protectants, Misc. (EUCERIN) cream Apply 1 application topically See admin instructions. Apply to legs and feet once daily    [provider]  torsemide (DEMADEX) 20 MG tablet Take 20 mg by mouth daily.    [provider]  Vitamin D, Ergocalciferol, (DRISDOL) 1.25 MG (50000 UNIT) CAPS capsule Take by mouth. 10/05/20   [provider]  warfarin (COUMADIN) 7.5 MG tablet Take 7.5 mg by mouth daily.    [provider]       DVT/GI PRX  assessed I Assessed the need for Labs I Assessed the need for Foley I Assessed the need for Central Venous Line Family Discussion when available I Assessed the need for Mobilization I made an Assessment of medications to be adjusted accordingly Safety Risk assessment completed  CASE DISCUSSED IN MULTIDISCIPLINARY ROUNDS WITH ICU TEAM     Critical Care Time devoted to patient care services described in this note is 75 minutes.   Critical care was necessary to treat /prevent imminent and life-threatening deterioration.   PATIENT  WITH VERY POOR PROGNOSIS I ANTICIPATE PROLONGED ICU LOS  Patient  is critically ill. Patient with Multiorgan failure and at high risk for cardiac arrest and death.    Lucie Leather, M.D.  [facility]  Medical Director [facility] Medical Director [facility] Cardio-Pulmonary Department

## 2021-08-21 NOTE — ED Triage Notes (Addendum)
[facility]- pt was given 7.5 mg of versed by EMS- they were called out for unresponsive pt and he began to seize with them- pt has a hx of seizures- per EMS pt has bedbugs

## 2021-08-21 NOTE — ED Provider Notes (Signed)
[facility] Provider Note    Event Date/Time   First MD Initiated Contact with Patient 08/21/21 [phone]     (approximate)   History   Seizures   HPI  Nathan Shepherd is a 66 y.o. male   here with status epilepticus. Pt arrives via emergency traffic. Per report, pt was picked up at a group home after being found unresponsive. Pt was found by EMS with stable vitals, but hypoxic, actively seizing with rightward gaze deviation. Pt given a total of versed 7.5 mg en route and remains seizing. He has not responded at all to EMS. Remainder of history limited 2/2 seizing on arrival.  Level 5 caveat invoked as remainder of history, ROS, and physical exam limited due to patient's seizing.       Physical Exam   Triage Vital Signs: ED Triage Vitals  Enc Vitals Group     BP 08/21/21 0813 (!) 155/133     Pulse Rate 08/21/21 0813 73     Resp 08/21/21 0813 (!) 29     Temp 08/21/21 0813 98.5 F (36.9 C)     Temp Source 08/21/21 0813 Rectal     SpO2 08/21/21 0813 93 %     Weight 08/21/21 0810 192 lb 11.2 oz (87.4 kg)     Height 08/21/21 0810 6\' 4"  (1.93 m)     Head Circumference --      Peak Flow --      Pain Score --      Pain Loc --      Pain Edu? --      Excl. in [place]? --     Most recent vital signs: Vitals:   08/21/21 1006 08/21/21 1009  BP: (!) 178/106 (!) 176/106  Pulse: 70 (!) 59  Resp: 18 19  Temp:    SpO2: (!) 86% 90%     General: Actively seizing, unresponsive. CV:  Good peripheral perfusion.  Resp:  Sonorous respirations with transmitted upper airway sounds. Abd:  No distention.  Other:  Eyes deviating right with rhythmic rightward eye movements, RUE and RLE seizing with tonic-clonic activity, LUE and LLE flexed but not shaking. No corneal or gag reflex noted.   ED Results / Procedures / Treatments   Labs (all labs ordered are listed, but only abnormal results are displayed) Labs Reviewed  CBC WITH DIFFERENTIAL/PLATELET -  Abnormal; Notable for the following components:      Result Value   Hemoglobin 11.4 (*)    HCT 36.7 (*)    MCH 25.3 (*)    Lymphs Abs 0.5 (*)    All other components within normal limits  COMPREHENSIVE METABOLIC PANEL - Abnormal; Notable for the following components:   Potassium 3.4 (*)    Glucose, Bld 107 (*)    Creatinine, Ser 1.52 (*)    GFR, Estimated 51 (*)    All other components within normal limits  BRAIN NATRIURETIC PEPTIDE - Abnormal; Notable for the following components:   B Natriuretic Peptide 365.3 (*)    All other components within normal limits  LACTIC ACID, PLASMA - Abnormal; Notable for the following components:   Lactic Acid, Venous 3.7 (*)    All other components within normal limits  PROTIME-INR - Abnormal; Notable for the following components:   Prothrombin Time 19.6 (*)    INR 1.7 (*)    All other components within normal limits  BLOOD GAS, ARTERIAL - Abnormal; Notable for the following components:   pO2, Arterial 69 (*)  Bicarbonate 29.9 (*)    Acid-Base Excess 4.8 (*)    All other components within normal limits  CBG MONITORING, ED - Abnormal; Notable for the following components:   Glucose-Capillary 123 (*)    All other components within normal limits  TROPONIN I (HIGH SENSITIVITY) - Abnormal; Notable for the following components:   Troponin I (High Sensitivity) 28 (*)    All other components within normal limits  SARS CORONAVIRUS 2 BY RT PCR  CULTURE, BLOOD (ROUTINE X 2)  CULTURE, BLOOD (ROUTINE X 2)  MAGNESIUM  ETHANOL  LEVETIRACETAM LEVEL  LACTIC ACID, PLASMA  TROPONIN I (HIGH SENSITIVITY)     EKG Atrial fibrillation, VR 70. QRS 94, QTc 405. No acute St elevations or depressions. No ischemia or infarct.   RADIOLOGY CXR: ETT in place, marked atelectasis vs aspiration R lung CXR 2: Persistent atelectasis/collapse R lung, no obvious PTX CT Head: No acute abnormality, no CVA CT Chest WO: Pneumomediastinum, diffuse aspiration and airway  disease throughout R lung, no obvious PTX   I also independently reviewed and agree with radiologist interpretations.   PROCEDURES:  Critical Care performed: Yes, see critical care procedure note(s)  .Critical Care  Performed by: Duffy Bruce, MD Authorized by: Duffy Bruce, MD   Critical care provider statement:    Critical care time (minutes):  3   Critical care time was exclusive of:  Separately billable procedures and treating other patients   Critical care was necessary to treat or prevent imminent or life-threatening deterioration of the following conditions:  Cardiac failure, circulatory failure, respiratory failure and CNS failure or compromise   Critical care was time spent personally by me on the following activities:  Development of treatment plan with patient or surrogate, discussions with consultants, evaluation of patient's response to treatment, examination of patient, ordering and review of laboratory studies, ordering and review of radiographic studies, ordering and performing treatments and interventions, pulse oximetry, re-evaluation of patient's condition and review of old charts Procedure Name: Intubation Date/Time: 08/21/2021 8:32 AM  Performed by: Duffy Bruce, MDPre-anesthesia Checklist: Patient identified Oxygen Delivery Method: Ambu bag Preoxygenation: Pre-oxygenation with 100% oxygen Induction Type: IV induction Ventilation: Mask ventilation without difficulty Laryngoscope Size: Glidescope and 4 Grade View: Grade I Tube size: 8.0 mm Number of attempts: 1 Airway Equipment and Method: Rigid stylet and Video-laryngoscopy Placement Confirmation: ETT inserted through vocal cords under direct vision Secured at: 23 cm Tube secured with: ETT holder Dental Injury: Teeth and Oropharynx as per pre-operative assessment  Difficulty Due To: Difficulty was unanticipated Future Recommendations: Recommend- induction with short-acting agent, and alternative  techniques readily available        MEDICATIONS ORDERED IN ED: Medications  levETIRAcetam (KEPPRA) IVPB 1000 mg/100 mL premix (has no administration in time range)  propofol (DIPRIVAN) 1000 MG/100ML infusion (35 mcg/kg/min  87.4 kg Intravenous Rate/Dose Change 08/21/21 0922)  fentaNYL (SUBLIMAZE) injection 50 mcg (50 mcg Intravenous Given 08/21/21 0852)  fentaNYL (SUBLIMAZE) injection 50-200 mcg (100 mcg Intravenous Given 08/21/21 0929)  levETIRAcetam (KEPPRA) IVPB 500 mg/100 mL premix (has no administration in time range)  vancomycin (VANCOREADY) IVPB 1750 mg/350 mL (has no administration in time range)  methylPREDNISolone sodium succinate (SOLU-MEDROL) 125 mg/2 mL injection 125 mg (has no administration in time range)  piperacillin-tazobactam (ZOSYN) IVPB 3.375 g (has no administration in time range)  methylPREDNISolone sodium succinate (SOLU-MEDROL) 40 mg/mL injection 40 mg (has no administration in time range)  ipratropium-albuterol (DUONEB) 0.5-2.5 (3) MG/3ML nebulizer solution 3 mL (has no administration in time  range)  budesonide (PULMICORT) nebulizer solution 0.5 mg (has no administration in time range)  0.9 %  sodium chloride infusion (999 mL/hr Intravenous New Bag/Given 08/21/21 0902)  levETIRAcetam (KEPPRA) IVPB 1000 mg/100 mL premix (0 mg Intravenous Stopped 08/21/21 0938)  levETIRAcetam (KEPPRA) IVPB 1000 mg/100 mL premix (0 mg Intravenous Stopped 08/21/21 1012)  sodium chloride 0.9 % bolus 1,000 mL (0 mLs Intravenous Stopped 08/21/21 1018)  etomidate (AMIDATE) injection (20 mg Intravenous Given 08/21/21 0817)  succinylcholine (ANECTINE) injection (150 mg Intravenous Given 08/21/21 0817)  levETIRAcetam (KEPPRA) IVPB 1000 mg/100 mL premix (0 mg Intravenous Stopped 08/21/21 0909)  ipratropium-albuterol (DUONEB) 0.5-2.5 (3) MG/3ML nebulizer solution (3 mLs  Given 08/21/21 0902)  norepinephrine (LEVOPHED) 4-5 MG/250ML-% infusion SOLN (0 mcg/kg/min  Stopped 08/21/21 0959)  EPINEPHrine  (EPI-PEN) 0.3 mg/0.3 mL injection ( Intramuscular Given 08/21/21 0907)  midazolam (VERSED) 2 MG/2ML injection (4 mg Intravenous Given 08/21/21 0929)  rocuronium bromide 100 MG/10ML SOSY (50 mg Intravenous Given 08/21/21 0933)  LORazepam (ATIVAN) injection 2 mg (2 mg Intravenous Given 08/21/21 0803)  LORazepam (ATIVAN) injection 2 mg (2 mg Intravenous Given 08/21/21 0809)     IMPRESSION / MDM / ASSESSMENT AND PLAN / ED COURSE  I reviewed the triage vital signs and the nursing notes.                               The patient is on the cardiac monitor to evaluate for evidence of arrhythmia and/or significant heart rate changes.   Ddx:  Differential includes the following, with pertinent life- or limb-threatening emergencies considered:  Status epilepticus, ICH on coumadin, status 2/2 general medical condition such as sepsis, med nonadherence, hypoxia/cardiac etiology  Patient's presentation is most consistent with acute presentation with potential threat to life or bodily function.  MDM:  66 yo M with h/o seizures here with status epilepticus. Pt arrived actively seizing after 7.5 mg versed, continued despite 4 mg ativan and subsequently intubated for airway protection. IV Keppra load given (4.5g) as well as propofol gtt. Dr. Selina Cooley with Neurology consulted immediately at [facility], will plan to transfer to [facility]. CT head ordered given h/o coumadin use. Supposed to be on Keppra per my review of his MAR. Unclear last known normal or when he began seizing. No apparent trauma noted on exam.   While awaiting CT, pt noted to desat to 30s. Pt increased to 100% FiO2 with 12 PEEP without significant improvement. Lung sounds markedly diminished on R. Suspect mucus plugging/aspiration. Pt given duonebs x 3 as well as epi IM in the event of bronchospasm as he has some slight wheezing as well, and placed with L lung up. Dr. Belia Heman with intensivist paged here and quickly at bedside for bronchoscopy, suctioned large  amount and noted pt had been air stacking as well. Sats improved. CT head shows no large bleed on my review. Pt sedated with no ongoing seizure activity noted clinically. Will plan to transfer to [facility].  Patient accepted to [facility] will go ED to ED. Dr. Durwin Nora accepting. Neuro has contacted Neuro at [facility] who is also aware. Breathing tx, steroids ordered for pt.   Attempted to call Purvis Kilts who is listed on contact form. Redirected to group home at [phone]. Will ask RN to evaluate if pt has guardian/contact.   MEDICATIONS GIVEN IN ED: Medications  levETIRAcetam (KEPPRA) IVPB 1000 mg/100 mL premix (has no administration in time range)  propofol (DIPRIVAN) 1000 MG/100ML infusion (35  mcg/kg/min  87.4 kg Intravenous Rate/Dose Change 08/21/21 0922)  fentaNYL (SUBLIMAZE) injection 50 mcg (50 mcg Intravenous Given 08/21/21 0852)  fentaNYL (SUBLIMAZE) injection 50-200 mcg (100 mcg Intravenous Given 08/21/21 0929)  levETIRAcetam (KEPPRA) IVPB 500 mg/100 mL premix (has no administration in time range)  vancomycin (VANCOREADY) IVPB 1750 mg/350 mL (has no administration in time range)  methylPREDNISolone sodium succinate (SOLU-MEDROL) 125 mg/2 mL injection 125 mg (has no administration in time range)  piperacillin-tazobactam (ZOSYN) IVPB 3.375 g (has no administration in time range)  methylPREDNISolone sodium succinate (SOLU-MEDROL) 40 mg/mL injection 40 mg (has no administration in time range)  ipratropium-albuterol (DUONEB) 0.5-2.5 (3) MG/3ML nebulizer solution 3 mL (has no administration in time range)  budesonide (PULMICORT) nebulizer solution 0.5 mg (has no administration in time range)  0.9 %  sodium chloride infusion (999 mL/hr Intravenous New Bag/Given 08/21/21 0902)  levETIRAcetam (KEPPRA) IVPB 1000 mg/100 mL premix (0 mg Intravenous Stopped 08/21/21 0938)  levETIRAcetam (KEPPRA) IVPB 1000 mg/100 mL premix (0 mg Intravenous Stopped 08/21/21 1012)  sodium chloride 0.9 % bolus 1,000 mL (0 mLs  Intravenous Stopped 08/21/21 1018)  etomidate (AMIDATE) injection (20 mg Intravenous Given 08/21/21 0817)  succinylcholine (ANECTINE) injection (150 mg Intravenous Given 08/21/21 0817)  levETIRAcetam (KEPPRA) IVPB 1000 mg/100 mL premix (0 mg Intravenous Stopped 08/21/21 0909)  ipratropium-albuterol (DUONEB) 0.5-2.5 (3) MG/3ML nebulizer solution (3 mLs  Given 08/21/21 0902)  norepinephrine (LEVOPHED) 4-5 MG/250ML-% infusion SOLN (0 mcg/kg/min  Stopped 08/21/21 0959)  EPINEPHrine (EPI-PEN) 0.3 mg/0.3 mL injection ( Intramuscular Given 08/21/21 0907)  midazolam (VERSED) 2 MG/2ML injection (4 mg Intravenous Given 08/21/21 0929)  rocuronium bromide 100 MG/10ML SOSY (50 mg Intravenous Given 08/21/21 0933)  LORazepam (ATIVAN) injection 2 mg (2 mg Intravenous Given 08/21/21 0803)  LORazepam (ATIVAN) injection 2 mg (2 mg Intravenous Given 08/21/21 0809)     Consults:  Intensivist at [facility] Dr. Selina Cooley Intensivist Dr. Belia Heman here for Bronch/evaluation   EMR reviewed  Prior visits     FINAL CLINICAL IMPRESSION(S) / ED DIAGNOSES   Final diagnoses:  Status epilepticus (HCC)  Bronchospasm  Mucus plugging of bronchi  Aspiration pneumonia of right lung, unspecified aspiration pneumonia type, unspecified part of lung (HCC)  Acute respiratory failure with hypoxia (HCC)     Rx / DC Orders   ED Discharge Orders     None        Note:  This document was prepared using Dragon voice recognition software and may include unintentional dictation errors.   Shaune Pollack, MD 08/21/21 1028

## 2021-08-21 NOTE — Progress Notes (Signed)
Patient came in intubated from [facility] hospital with a 8.0 ETT taped at 25 cm at the lip good BBS ausculted, placed on above vent settings per [facility] net protocol.

## 2021-08-21 NOTE — ED Notes (Signed)
Neurology at bedside.

## 2021-08-21 NOTE — ED Notes (Signed)
Report attempted to Dr. Durwin Nora. No answer at phone number provided [phone]

## 2021-08-21 NOTE — Consult Note (Addendum)
NEUROLOGY CONSULTATION NOTE   Date of service: August 21, 2021 Patient Name: Nathan Shepherd MRN:  [id number] DOB:  02-Mar-1955 Reason for consult: "Status epilepticus" Requesting Provider: Cheri Fowler, MD _ _ _   _ __   _ __ _ _  __ __   _ __   __ _  History of Present Illness  Nathan Shepherd is a 66 y.o. male with PMH significant for CHF, CKD3, HTN, HLD, seizures on Keppra 1500 BID in the setting of L occipital stroke, afibb on warfarin with INR of 1.7 who was found unresponsive and seizing at group home. Noted R gaze deviation by EMS and given Versed 7.5mg  which did not resolve the seizure. Intubated in the ED and bronched for a mucus plug.  CTH negative for ICH, he was given Keppra 4.5g and started on propofol and transferred to [facility]. Exam by our team at [facility] with pinpoint pupils, no brainstem reflexes and no response to noxious stimuli.   ROS  Unable to obtain 2/2 intubation, sedation and status epilepticus.  Past History   Past Medical History:  Diagnosis Date   CHF (congestive heart failure) (HCC)    CKD (chronic kidney disease) stage 3, GFR 30-59 ml/min (HCC)    Edema    Hyperlipemia    Hypertension    Seizures (HCC)    History reviewed. No pertinent surgical history. History reviewed. No pertinent family history. Social History   Socioeconomic History   Marital status: Single    Spouse name: Not on file   Number of children: Not on file   Years of education: Not on file   Highest education level: Not on file  Occupational History   Not on file  Tobacco Use   Smoking status: Never   Smokeless tobacco: Never  Substance and Sexual Activity   Alcohol use: Not on file   Drug use: Not on file   Sexual activity: Not on file  Other Topics Concern   Not on file  Social History Narrative   Not on file   Social Determinants of Health   Financial Resource Strain: Not on file  Food Insecurity: Not on file  Transportation Needs: Not on file  Physical  Activity: Not on file  Stress: Not on file  Social Connections: Not on file   Allergies  Allergen Reactions   Penicillins Hives and Other (See Comments)    Medications  (Not in a hospital admission)    Vitals   Vitals:   08/21/21 1700 08/21/21 1727 08/21/21 1730 08/21/21 1745  BP:  (!) 132/92 (!) 131/90 (!) 134/95  Pulse:  64 66 62  Resp:  20 20 20   SpO2: 100% 100% 100% 100%  Height: 6\' 3"  (1.905 m)        Body mass index is 24.09 kg/m.  Physical Exam   General: Laying comfortably in bed; in no acute distress.  HENT: Normal oropharynx and mucosa. Normal external appearance of ears and nose.  Neck: Supple, no pain or tenderness  CV: No JVD. No peripheral edema.  Pulmonary: Symmetric Chest rise. Abdomen: Soft to touch, non-tender.  Ext: No cyanosis, edema, or deformity  Skin: No rash. Normal palpation of skin.   Musculoskeletal: Normal digits and nails by inspection. No clubbing.   Neurologic Examination  Mental status/Cognition: no response to loud voice. Facial grimace and mild turning of the head to right with nares stimulation with Qtip. No response to sternal rub. Speech/language: limited by intubation and sedation. Mute, no  attempts to communicate. Cranial nerves:   CN II Pupils pinpoint but reactive to bright light.   CN III,IV,VI Dysconjugate upwards gaze, ?weak positive dolls eyes.   CN V Corneals intact BL   CN VII Symmetric facial grimace to nares stimulation,   CN VIII Does not turn head towards speech   CN IX & X No cough, no gag.   CN XI Head turned to right initially but turned to midline and stays midline.   CN XII Unable to assess.   Motor/sensory:  Muscle bulk: normal, tone flaccid. No response to mild pinch in any extremities.  Reflexes:  Right Left Comments  Pectoralis      Biceps (C5/6) 2 2   Brachioradialis (C5/6) 2 2    Triceps (C6/7) 2 2    Patellar (L3/4) 2 2    Achilles (S1)      Hoffman      Plantar     Jaw jerk     Coordination/Complex Motor:  - unable to assess.  Labs   CBC:  Recent Labs  Lab 08/21/21 0812  WBC 5.2  NEUTROABS 4.2  HGB 11.4*  HCT 36.7*  MCV 81.6  PLT 222    Basic Metabolic Panel:  Lab Results  Component Value Date   NA 140 08/21/2021   K 3.4 (L) 08/21/2021   CO2 27 08/21/2021   GLUCOSE 107 (H) 08/21/2021   BUN 14 08/21/2021   CREATININE 1.52 (H) 08/21/2021   CALCIUM 9.1 08/21/2021   GFRNONAA 51 (L) 08/21/2021   GFRAA 59 (L) 12/15/2016   Lipid Panel:  Lab Results  Component Value Date   LDLCALC 37 08/18/2016   HgbA1c:  Lab Results  Component Value Date   HGBA1C 6.4 (H) 08/18/2016   Urine Drug Screen:     Component Value Date/Time   LABOPIA NONE DETECTED 08/16/2016 2254   COCAINSCRNUR NONE DETECTED 08/16/2016 2254   LABBENZ NONE DETECTED 08/16/2016 2254   AMPHETMU NONE DETECTED 08/16/2016 2254   THCU NONE DETECTED 08/16/2016 2254   LABBARB NONE DETECTED 08/16/2016 2254    Alcohol Level     Component Value Date/Time   ETH <10 08/21/2021 0812    CT Head without contrast(Personally reviewed): CTH was negative for a large hypodensity concerning for a large territory infarct or hyperdensity concerning for an ICH. Old L PCA stroke.   MRI Brain: pending  cEEG:  pending  Impression   Nathan Shepherd is a 66 y.o. male with PMH significant for CHF, CKD3, HTN, HLD, seizures on Keppra 1500 BID in the setting of L occipital stroke, afibb on warfarin with INR of 1.7 who was found unresponsive and presented to [facility] in status epilepticus.  No clinical seizures noted but is on sedation with propofol and on Keppra.  Recommendations  - STAT cEEG ordered and tech notified to use MRI compatible leads. - continue Keppra 1500mg  Q12hours. - Will load with Valproic acid next if seizures on cEEG. - continue propofol at 42mcg/kg/min. Would not recommend weaning down unless cEEG is hooked up and negative for seizures. - MRI brain without contrast  ordered. - Seizure precautions  This patient is critically ill and at significant risk of neurological worsening, death and care requires constant monitoring of vital signs, hemodynamics,respiratory and cardiac monitoring, neurological assessment, discussion with family, other specialists and medical decision making of high complexity. I spent 30 minutes of neurocritical care time  in the care of  this patient. This was time spent independent of any time  provided by nurse practitioner or PA.  Donnetta Simpers [facility] Pager Number [phone] 08/21/2021  6:26 PM   Update. 6:43 PM - Was notified of concern for seizure activity with dysconjugate gaze and jerking/tremoring. - Will give Ativan 4mg  and load with Valproic acid 1500mg  Iv once. - cEEG is being hooked up but not on it yet. ______________________________________________________________________   Thank you for the opportunity to take part in the care of this patient. If you have any further questions, please contact the neurology consultation attending.  Signed,  [place] Pager Number [phone] _ _ _   _ __   _ __ _ _  __ __   _ __   __ _

## 2021-08-21 NOTE — H&P (Signed)
NAME:  Nathan Shepherd, MRN:  [id number], DOB:  Sep 19, 1955, LOS: 0 ADMISSION DATE:  08/21/2021, CONSULTATION DATE: 8/19 REFERRING MD: Dr. Belia Heman, CHIEF COMPLAINT: Seizure  History of Present Illness:  66 year old male who presented to [facility] on 8/19 with reports of status epilepticus.  The patient is a group home resident and was found unresponsive.  EMS was activated and found him to be hypoxic and actively seizing with a rightward gaze.  He was given a total of 7.5 mg Versed in route but continued to seize.  He has had no purposeful movement with EMS observation.  He was intubated emergently in the ER and placed on mechanical ventilation.  Initial chest x-ray concerning for mucous plugging with right-sided opacity.  He was emergently bronched at bedside in the emergency room with extensive mucus plugging removed from the right lung. The patient was loaded with keppra in the ER.  He was treated as a severe COPE exacerbation with IV steroids, and nebulized bronchodilators.  In addition to seizure and resp failure,  he had an AKI with sr Cr of 1.52.  The patient was seen by Neurology and recommended transfer to [facility] for continuous EEG monitoring.    PCCM consulted for admission.   Pertinent  Medical History  Seizure disorder HTN HLD CKD 3 CHF  Significant Hospital Events: Including procedures, antibiotic start and stop dates in addition to other pertinent events   8/19 Presented to [facility] in status, R sided mucus plugging. Intubated.  Tx to [facility]  Interim History / Subjective:  As above   Objective   Blood pressure (!) 165/104, pulse 65, resp. rate 20, height 6\' 3"  (1.905 m), SpO2 100 %.    Vent Mode: PRVC FiO2 (%):  [40 %-100 %] 40 % Set Rate:  [14 bmp-20 bmp] 20 bmp Vt Set:  [550 mL-670 mL] 670 mL PEEP:  [5 cmH20-12 cmH20] 5 cmH20 Plateau Pressure:  [16 cmH20] 16 cmH20  No intake or output data in the 24 hours ending 08/21/21 1836 There were no vitals filed for this  visit.  Examination: General: chronically ill appearing adult male lying in bed in NAD HENT: MM pink/moist, ETT, pupils pinpoint - left with upward gaze, right forward. +Corneal response, minimal gag with suction  Lungs: non-labored on vent, clear on left, bronchial breath sounds on right  Cardiovascular: s1s2 RRR, no m/r/g Abdomen: soft, non-tender, bsx4 active  Extremities: warm/dry, no significant edema  Neuro: sedate on propofol  Resolved Hospital Problem list     Assessment & Plan:   Status Epilepticus  Hx Seizures, L Occipital CVA, Multi-infarct Dementia, Prior Polysubstance Abuse  Acute Encephalopathy secondary to Seizure  Presented in status, AMS.  Required versed without cessation. CT head negative for acute process, evidence of old CVA.  -admit to ICU  -intubated for airway protection, seizure   -s/p keppra load at [facility]  -continuous EEG  -defer further AED's to Neurology  -follow neuro exam  -consider restarting donepezil 8/20  Acute Hypoxic Respiratory Failure  Acute Mucus Plugging, Suspected Aspiration  Pulmonary Nodules Enlarged Pulmonary Arteries Never smoker. CT at [facility] with pneumomediastinum, pulmonary nodules, diffuse R GGO -PRVC with LTVV  -ABG in one hour -assess CXR post transport  -daily SBT / WUA -PAD protocol with RASS goal of -3 to -4  -mucomyst + albuterol BID x 2 days  -chest PT via bed  -brovna + pulmicort in lieu of symbicort   Chronic AF on Warfarin Hx HTN, HLD -tele monitoring  -continue ASA,  lipitor -hold home cozaar, nifedipine, demadex, coreg 8/19, consider restart 8/20 -coumadin per pharmacy   AKI on Hx CKD III, GFR 30-59 Hypokalemia -Trend BMP / urinary output -Replace electrolytes as indicated, KCL x1  -Avoid nephrotoxic agents, ensure adequate renal perfusion  Normocytic Normochromic Anemia  Suspect ACD -trend Hgb -transfuse for Hgb <7% or active bleeding   Best Practice (right click and "Reselect all SmartList  Selections" daily)  Diet/type: tubefeeds and NPO DVT prophylaxis: prophylactic heparin  GI prophylaxis: PPI Lines: N/A Foley:  N/A Code Status:  full code Last date of multidisciplinary goals of care discussion: will update family on arrival  Labs   CBC: Recent Labs  Lab 08/21/21 0812 08/21/21 1821  WBC 5.2  --   NEUTROABS 4.2  --   HGB 11.4* 12.9*  HCT 36.7* 38.0*  MCV 81.6  --   PLT 222  --     Basic Metabolic Panel: Recent Labs  Lab 08/21/21 0812 08/21/21 1821  NA 140 140  K 3.4* 3.0*  CL 106  --   CO2 27  --   GLUCOSE 107*  --   BUN 14  --   CREATININE 1.52*  --   CALCIUM 9.1  --   MG 1.9  --    GFR: Estimated Creatinine Clearance: 59.5 mL/min (A) (by C-G formula based on SCr of 1.52 mg/dL (H)). Recent Labs  Lab 08/21/21 0812 08/21/21 1019  WBC 5.2  --   LATICACIDVEN 3.7* 3.2*    Liver Function Tests: Recent Labs  Lab 08/21/21 0812  AST 22  ALT 14  ALKPHOS 77  BILITOT 0.5  PROT 7.1  ALBUMIN 3.8   No results for input(s): "LIPASE", "AMYLASE" in the last 168 hours. No results for input(s): "AMMONIA" in the last 168 hours.  ABG    Component Value Date/Time   PHART 7.495 (H) 08/21/2021 1821   PCO2ART 27.8 (L) 08/21/2021 1821   PO2ART 126 (H) 08/21/2021 1821   HCO3 21.4 08/21/2021 1821   TCO2 22 08/21/2021 1821   ACIDBASEDEF 1.0 08/21/2021 1821   O2SAT 99 08/21/2021 1821     Coagulation Profile: Recent Labs  Lab 08/21/21 0812  INR 1.7*    Cardiac Enzymes: No results for input(s): "CKTOTAL", "CKMB", "CKMBINDEX", "TROPONINI" in the last 168 hours.  HbA1C: Hgb A1c MFr Bld  Date/Time Value Ref Range Status  08/18/2016 02:35 AM 6.4 (H) 4.8 - 5.6 % Final    Comment:    (NOTE) Pre diabetes:          5.7%-6.4% Diabetes:              >6.4% Glycemic control for   <7.0% adults with diabetes     CBG: Recent Labs  Lab 08/21/21 0900 08/21/21 1617  GLUCAP 123* 140*    Review of Systems:   Unable to complete as patient is  altered on vent.   Past Medical History:  He,  has a past medical history of CHF (congestive heart failure) (HCC), CKD (chronic kidney disease) stage 3, GFR 30-59 ml/min (HCC), Edema, Hyperlipemia, Hypertension, and Seizures (HCC).   Surgical History:  History reviewed. No pertinent surgical history.   Social History:   reports that he has never smoked. He has never used smokeless tobacco.   Family History:  His family history is not on file.   Allergies Allergies  Allergen Reactions   Penicillins Hives and Other (See Comments)     Home Medications  Prior to Admission medications  Medication Sig Start Date End Date Taking? Authorizing Provider  acetaminophen (TYLENOL) 325 MG tablet Take by mouth. 12/08/20   [provider]  aspirin 81 MG chewable tablet Chew by mouth.    [provider]  aspirin EC 81 MG tablet Take 81 mg by mouth daily.    [provider]  atorvastatin (LIPITOR) 80 MG tablet Take by mouth.    [provider]  budesonide-formoterol (SYMBICORT) 160-4.5 MCG/ACT inhaler Inhale into the lungs.    [provider]  carvedilol (COREG) 12.5 MG tablet Take 12.5 mg by mouth 2 (two) times daily with a meal.    [provider]  Docusate Sodium (DSS) 100 MG CAPS Take by mouth.    [provider]  donepezil (ARICEPT) 5 MG tablet Take 5 mg by mouth at bedtime.    [provider]  furosemide (LASIX) 20 MG tablet Take by mouth.    [provider]  levETIRAcetam (KEPPRA) 1000 MG tablet Take 1,000 mg by mouth 2 (two) times daily. 01/04/21   [provider]  levETIRAcetam (KEPPRA) 500 MG tablet Take 1,000 mg by mouth 2 (two) times daily.     [provider]  losartan (COZAAR) 50 MG tablet Take 100 mg by mouth daily.     [provider]  losartan (COZAAR) 50 MG tablet Take by mouth.    [provider]  NIFEdipine (PROCARDIA XL/NIFEDICAL XL) 60 MG 24 hr tablet Take 60 mg  by mouth daily. 01/04/21   [provider]  NIFEdipine (PROCARDIA-XL/ADALAT CC) 60 MG 24 hr tablet Take 120 mg by mouth daily.     [provider]  rosuvastatin (CRESTOR) 20 MG tablet Take 20 mg by mouth at bedtime.     [provider]  Skin Protectants, Misc. (EUCERIN) cream Apply 1 application topically See admin instructions. Apply to legs and feet once daily    [provider]  torsemide (DEMADEX) 20 MG tablet Take 20 mg by mouth daily.    [provider]  Vitamin D, Ergocalciferol, (DRISDOL) 1.25 MG (50000 UNIT) CAPS capsule Take by mouth. 10/05/20   [provider]  warfarin (COUMADIN) 7.5 MG tablet Take 7.5 mg by mouth daily.    [provider]     Critical care time: 48 minutes     Noe Gens, MSN, APRN, NP-C, AGACNP-BC [facility] 08/21/2021, 6:36 PM   Please see [email or web address] for pager details.   From 7A-7P if no response, please call [phone] After hours, please call ELink [phone]

## 2021-08-21 NOTE — ED Notes (Signed)
Dr Belia Heman at bedside to preform emergent bronchoscopy

## 2021-08-21 NOTE — Progress Notes (Signed)
EEG complete - results pending 

## 2021-08-21 NOTE — ED Notes (Signed)
3rd attempt  No answer for report call to MD

## 2021-08-21 NOTE — ED Notes (Signed)
Norepi started at 

## 2021-08-21 NOTE — Consult Note (Signed)
PHARMACY -  BRIEF ANTIBIOTIC NOTE   Pharmacy has received consult(s) for vancomycin and zosyn from an ED provider.  The patient's profile has been reviewed for ht/wt/allergies/indication/available labs.    One time order(s) placed for vancomycin and zosyn.  Further antibiotics/pharmacy consults should be ordered by admitting physician if indicated.                       Thank you,  Celene Squibb, PharmD PGY1 Pharmacy Resident 08/21/2021 9:18 AM

## 2021-08-21 NOTE — ED Notes (Signed)
Report attempted with Phone number provided. No answer

## 2021-08-21 NOTE — ED Triage Notes (Signed)
Pt BIB [facility] from [facility] as transfer/admit d/t being found unresponsive at his group home with reports of witnessed seizures. CT showed a stroke. He arrived here to [facility] intubated with an 8 tube- 26 @ the lip, 3 PIV's, sedated with Prop at 45. Last set of v/s 103/66, 60 bpm, 100% O2 on the vent. Foley cath & OG tube in place.

## 2021-08-21 NOTE — Progress Notes (Signed)
LTM EEG hooked up and running - no initial skin breakdown - push button tested - neuro notified. [facility] monitoring.  

## 2021-08-21 NOTE — ED Provider Notes (Signed)
Pt is here as an ED to ED transfer from [facility].  Pt came to the ED today seizing. He was intubated for airway protection.  He was seen by critical care and by neurology there.  There was a recommendation for a stat EEG.  He is stable currently.  He is d/w CCM and with neurology here.  They will both come to see pt.   Jacalyn Lefevre, MD 08/21/21 1731

## 2021-08-21 NOTE — Progress Notes (Addendum)
ANTICOAGULATION CONSULT NOTE - Initial Consult  Pharmacy Consult for warfarin Indication: atrial fibrillation  Allergies  Allergen Reactions   Penicillins Hives and Other (See Comments)    Patient Measurements: Height: 6\' 3"  (190.5 cm) IBW/kg (Calculated) : 84.5  Vital Signs: Temp: 98.5 F (36.9 C) (08/19 1600) Temp Source: Rectal (08/19 0813) BP: 165/104 (08/19 1800) Pulse Rate: 65 (08/19 1800)  Labs: Recent Labs    08/21/21 0812 08/21/21 1019 08/21/21 1821  HGB 11.4*  --  12.9*  HCT 36.7*  --  38.0*  PLT 222  --   --   LABPROT 19.6*  --   --   INR 1.7*  --   --   CREATININE 1.52*  --   --   TROPONINIHS 28* 48*  --     Estimated Creatinine Clearance: 59.5 mL/min (A) (by C-G formula based on SCr of 1.52 mg/dL (H)).   Medical History: Past Medical History:  Diagnosis Date   CHF (congestive heart failure) (HCC)    CKD (chronic kidney disease) stage 3, GFR 30-59 ml/min (HCC)    Edema    Hyperlipemia    Hypertension    Seizures (HCC)     Medications:  Medications Prior to Admission  Medication Sig Dispense Refill Last Dose   acetaminophen (TYLENOL) 325 MG tablet Take by mouth.      aspirin 81 MG chewable tablet Chew by mouth.      aspirin EC 81 MG tablet Take 81 mg by mouth daily.      atorvastatin (LIPITOR) 80 MG tablet Take by mouth.      budesonide-formoterol (SYMBICORT) 160-4.5 MCG/ACT inhaler Inhale into the lungs.      carvedilol (COREG) 12.5 MG tablet Take 12.5 mg by mouth 2 (two) times daily with a meal.      Docusate Sodium (DSS) 100 MG CAPS Take by mouth.      donepezil (ARICEPT) 5 MG tablet Take 5 mg by mouth at bedtime.      furosemide (LASIX) 20 MG tablet Take by mouth.      levETIRAcetam (KEPPRA) 1000 MG tablet Take 1,000 mg by mouth 2 (two) times daily.      levETIRAcetam (KEPPRA) 500 MG tablet Take 1,000 mg by mouth 2 (two) times daily.       losartan (COZAAR) 50 MG tablet Take 100 mg by mouth daily.       losartan (COZAAR) 50 MG tablet  Take by mouth.      NIFEdipine (PROCARDIA XL/NIFEDICAL XL) 60 MG 24 hr tablet Take 60 mg by mouth daily.      NIFEdipine (PROCARDIA-XL/ADALAT CC) 60 MG 24 hr tablet Take 120 mg by mouth daily.       rosuvastatin (CRESTOR) 20 MG tablet Take 20 mg by mouth at bedtime.       Skin Protectants, Misc. (EUCERIN) cream Apply 1 application topically See admin instructions. Apply to legs and feet once daily      torsemide (DEMADEX) 20 MG tablet Take 20 mg by mouth daily.      Vitamin D, Ergocalciferol, (DRISDOL) 1.25 MG (50000 UNIT) CAPS capsule Take by mouth.      warfarin (COUMADIN) 7.5 MG tablet Take 7.5 mg by mouth daily.      Scheduled:   acetylcysteine  4 mL Nebulization BID   albuterol  2.5 mg Nebulization BID   arformoterol  15 mcg Nebulization BID   aspirin  81 mg Per Tube Daily   [START ON 08/22/2021] atorvastatin  80 mg  Per Tube Daily   budesonide (PULMICORT) nebulizer solution  0.5 mg Nebulization BID   [START ON 08/22/2021] Chlorhexidine Gluconate Cloth  6 each Topical Q0600   docusate  100 mg Per Tube BID   feeding supplement (PROSource TF20)  60 mL Per Tube Daily   feeding supplement (VITAL HIGH PROTEIN)  1,000 mL Per Tube Q24H   heparin  5,000 Units Subcutaneous Q8H   levETIRAcetam  1,500 mg Per Tube Q12H   mouth rinse  15 mL Mouth Rinse Q2H   mouth rinse  15 mL Mouth Rinse Q2H   pantoprazole  40 mg Per Tube Daily   polyethylene glycol  17 g Per Tube Daily   potassium chloride  20 mEq Per Tube Once    Assessment: 66 YO male transferred from [facility] 8/19 requiring intubation and continuous EEG monitoring following status epilepticus. The patient has a history of chronic afib. Based on medication history and prior dispensing records, he seems to be managed with warfarin 7.5mg  daily. Med rec unable to be completed at this time due to patient intubation and lack of response from patient's guardian. Based on patient's timing of presentation to ED (~0800 8/19), it is unlikely that he took  his warfarin dose 8/19.   His most recent INR was subtherapeutic at 1.7 on 8/19. Hgb 12.9, stable. No documented signs/symptoms of bleeding.   Patient also on heparin Patrick 5000u TID for VTE prophylaxis.  Goal of Therapy:  INR 2-3 Monitor platelets by anticoagulation protocol: Yes   Plan:  Give warfarin 7.5mg  tonight  F/u INR with AM labs  Monitor CBC, signs/symptoms of bleeding, INR daily  F/u PTA warfarin regimen Discontinue heparin [place] once patient's INR @ goal    Cherylin Mylar, PharmD PGY1 Pharmacy Resident 8/19/20237:04 PM

## 2021-08-22 ENCOUNTER — Inpatient Hospital Stay (HOSPITAL_COMMUNITY): Payer: Medicare Other

## 2021-08-22 DIAGNOSIS — E876 Hypokalemia: Secondary | ICD-10-CM

## 2021-08-22 DIAGNOSIS — G40901 Epilepsy, unspecified, not intractable, with status epilepticus: Secondary | ICD-10-CM | POA: Diagnosis not present

## 2021-08-22 DIAGNOSIS — J9601 Acute respiratory failure with hypoxia: Secondary | ICD-10-CM | POA: Diagnosis not present

## 2021-08-22 LAB — TRIGLYCERIDES: Triglycerides: 58 mg/dL (ref <150)

## 2021-08-22 LAB — CBC
HCT: 38.9 % — ABNORMAL LOW (ref 39.0–52.0)
Hemoglobin: 12.7 g/dL — ABNORMAL LOW (ref 13.0–17.0)
MCH: 26.3 pg (ref 26.0–34.0)
MCHC: 32.6 g/dL (ref 30.0–36.0)
MCV: 80.5 fL (ref 80.0–100.0)
Platelets: 206 10*3/uL (ref 150–400)
RBC: 4.83 MIL/uL (ref 4.22–5.81)
RDW: 14.6 % (ref 11.5–15.5)
WBC: 14.3 10*3/uL — ABNORMAL HIGH (ref 4.0–10.5)
nRBC: 0 % (ref 0.0–0.2)

## 2021-08-22 LAB — BASIC METABOLIC PANEL
Anion gap: 13 (ref 5–15)
BUN: 11 mg/dL (ref 8–23)
CO2: 19 mmol/L — ABNORMAL LOW (ref 22–32)
Calcium: 9.1 mg/dL (ref 8.9–10.3)
Chloride: 105 mmol/L (ref 98–111)
Creatinine, Ser: 1.43 mg/dL — ABNORMAL HIGH (ref 0.61–1.24)
GFR, Estimated: 55 mL/min — ABNORMAL LOW (ref >60)
Glucose, Bld: 134 mg/dL — ABNORMAL HIGH (ref 70–99)
Potassium: 3.3 mmol/L — ABNORMAL LOW (ref 3.5–5.1)
Sodium: 137 mmol/L (ref 135–145)

## 2021-08-22 LAB — PROTIME-INR
INR: 1.7 — ABNORMAL HIGH (ref 0.8–1.2)
Prothrombin Time: 20.2 seconds — ABNORMAL HIGH (ref 11.4–15.2)

## 2021-08-22 LAB — GLUCOSE, CAPILLARY
Glucose-Capillary: 103 mg/dL — ABNORMAL HIGH (ref 70–99)
Glucose-Capillary: 108 mg/dL — ABNORMAL HIGH (ref 70–99)
Glucose-Capillary: 116 mg/dL — ABNORMAL HIGH (ref 70–99)
Glucose-Capillary: 80 mg/dL (ref 70–99)
Glucose-Capillary: 86 mg/dL (ref 70–99)
Glucose-Capillary: 95 mg/dL (ref 70–99)

## 2021-08-22 LAB — MAGNESIUM
Magnesium: 1.7 mg/dL (ref 1.7–2.4)
Magnesium: 2.4 mg/dL (ref 1.7–2.4)

## 2021-08-22 LAB — PHOSPHORUS
Phosphorus: <1 mg/dL — CL (ref 2.5–4.6)
Phosphorus: 2.6 mg/dL (ref 2.5–4.6)

## 2021-08-22 MED ORDER — DIVALPROEX SODIUM 250 MG PO DR TAB
250.0000 mg | DELAYED_RELEASE_TABLET | Freq: Two times a day (BID) | ORAL | Status: DC
  Administered 2021-08-22: 250 mg via ORAL
  Filled 2021-08-22 (×3): qty 1

## 2021-08-22 MED ORDER — MAGNESIUM SULFATE 4 GM/100ML IV SOLN
4.0000 g | Freq: Once | INTRAVENOUS | Status: AC
  Administered 2021-08-22: 4 g via INTRAVENOUS
  Filled 2021-08-22 (×2): qty 100

## 2021-08-22 MED ORDER — POTASSIUM PHOSPHATES 15 MMOLE/5ML IV SOLN
15.0000 mmol | Freq: Once | INTRAVENOUS | Status: AC
  Administered 2021-08-22: 15 mmol via INTRAVENOUS
  Filled 2021-08-22: qty 5

## 2021-08-22 MED ORDER — WARFARIN SODIUM 7.5 MG PO TABS
7.5000 mg | ORAL_TABLET | Freq: Once | ORAL | Status: AC
Start: 2021-08-22 — End: 2021-08-22
  Administered 2021-08-22: 7.5 mg
  Filled 2021-08-22: qty 1

## 2021-08-22 MED ORDER — HYDRALAZINE HCL 20 MG/ML IJ SOLN
10.0000 mg | INTRAMUSCULAR | Status: DC | PRN
  Administered 2021-08-22 (×3): 10 mg via INTRAVENOUS
  Filled 2021-08-22 (×3): qty 1

## 2021-08-22 MED ORDER — DEXMEDETOMIDINE HCL IN NACL 400 MCG/100ML IV SOLN
INTRAVENOUS | Status: AC
  Filled 2021-08-22: qty 100

## 2021-08-22 MED ORDER — POTASSIUM PHOSPHATES 15 MMOLE/5ML IV SOLN
30.0000 mmol | Freq: Once | INTRAVENOUS | Status: AC
  Administered 2021-08-22: 30 mmol via INTRAVENOUS
  Filled 2021-08-22: qty 10

## 2021-08-22 MED ORDER — DEXMEDETOMIDINE HCL IN NACL 400 MCG/100ML IV SOLN
0.4000 ug/kg/h | INTRAVENOUS | Status: DC
  Administered 2021-08-22: 0.5 ug/kg/h via INTRAVENOUS

## 2021-08-22 MED ORDER — VALPROATE SODIUM 100 MG/ML IV SOLN
250.0000 mg | Freq: Two times a day (BID) | INTRAVENOUS | Status: DC
  Administered 2021-08-22: 250 mg via INTRAVENOUS
  Filled 2021-08-22 (×5): qty 2.5

## 2021-08-22 NOTE — Procedures (Signed)
Patient Name: Nathan Shepherd  MRN: [id number]  Epilepsy Attending: Charlsie Quest  Referring Physician/Provider: Erick Blinks, MD   Date: 08/21/2021  Duration: 21.08 mins   Patient history: 66 y.o. male with PMH significant for CHF, CKD3, HTN, HLD, seizures on Keppra 1500 BID in the setting of L occipital stroke, afibb on warfarin with INR of 1.7 who was found unresponsive and presented to [facility] in status epilepticus. EEG to evaluate for seizure.    Level of alertness: comatose   AEDs during EEG study: LEV, propofol   Technical aspects: This EEG study was done with scalp electrodes positioned according to the 10-20 International system of electrode placement. Electrical activity was reviewed with band pass filter of 1-70Hz , sensitivity of 7 uV/mm, display speed of 2mm/sec with a 60Hz  notched filter applied as appropriate. EEG data were recorded continuously and digitally stored.  Video monitoring was available and reviewed as appropriate.   Description: EEG showed continuous generalized and lateralized left hemisphere 2-3hz  delta slowing admixed with 15-18Hz  generalized beta activity.Sharp waves were noted in left temporal region. Hyperventilation and photic stimulation were not performed.      ABNORMALITY - Sharp wave, left temporal region - Continuous slow, generalized and lateralized left hemisphere    IMPRESSION: This study showed evidence of epileptogenicity arising from left temporal region. There is also cortical dysfunction in left hemisphere likely secondary to underlying structural abnormality, post-ictal state. Additionally there is severe diffuse encephalopathy, nonspecific etiology. No seizures were seen throughout the recording.   Corry Ihnen 

## 2021-08-22 NOTE — Progress Notes (Signed)
ANTICOAGULATION CONSULT NOTE - Initial Consult  Pharmacy Consult for warfarin Indication: atrial fibrillation  Allergies  Allergen Reactions   Penicillins Hives    Patient Measurements: Height: 6\' 3"  (190.5 cm) IBW/kg (Calculated) : 84.5  Vital Signs: Temp: 97.2 F (36.2 C) (08/20 1500) BP: 120/99 (08/20 1500) Pulse Rate: 62 (08/20 1500)  Labs: Recent Labs    08/21/21 0812 08/21/21 1019 08/21/21 1821 08/22/21 0347  HGB 11.4*  --  12.9* 12.7*  HCT 36.7*  --  38.0* 38.9*  PLT 222  --   --  206  LABPROT 19.6*  --   --  20.2*  INR 1.7*  --   --  1.7*  CREATININE 1.52*  --   --  1.43*  TROPONINIHS 28* 48*  --   --      Estimated Creatinine Clearance: 63.2 mL/min (A) (by C-G formula based on SCr of 1.43 mg/dL (H)).   Medical History: Past Medical History:  Diagnosis Date   CHF (congestive heart failure) (HCC)    CKD (chronic kidney disease) stage 3, GFR 30-59 ml/min (HCC)    Edema    Hyperlipemia    Hypertension    Seizures (HCC)     Medications:  Medications Prior to Admission  Medication Sig Dispense Refill Last Dose   acetaminophen (TYLENOL) 325 MG tablet Take 650 mg by mouth every 6 (six) hours as needed for mild pain.   unk   aspirin EC 81 MG tablet Take 81 mg by mouth daily.   8.18.2023   budesonide-formoterol (SYMBICORT) 160-4.5 MCG/ACT inhaler Inhale 2 puffs into the lungs in the morning and at bedtime.   8.18.2023   carvedilol (COREG) 12.5 MG tablet Take 12.5 mg by mouth 2 (two) times daily with a meal.   8.18.2023 at 0800   donepezil (ARICEPT) 5 MG tablet Take 5 mg by mouth at bedtime.   8.17.2023   levETIRAcetam (KEPPRA) 1000 MG tablet Take 1,000 mg by mouth 2 (two) times daily.   8.18.2023   losartan (COZAAR) 50 MG tablet Take 50 mg by mouth daily.   8.18.2023   NIFEdipine (PROCARDIA XL/NIFEDICAL XL) 60 MG 24 hr tablet Take 60 mg by mouth daily.   8.18.2023   rosuvastatin (CRESTOR) 20 MG tablet Take 20 mg by mouth at bedtime.    8.17.2023   Skin  Protectants, Misc. (EUCERIN) cream Apply 1 application  topically daily. Apply to legs and feet once daily   8.18.2023   torsemide (DEMADEX) 20 MG tablet Take 20 mg by mouth daily.   8.18.2023   Vitamin D, Ergocalciferol, (DRISDOL) 1.25 MG (50000 UNIT) CAPS capsule Take 50,000 Units by mouth every 7 (seven) days. Thursday's   8.17.2023   warfarin (COUMADIN) 7.5 MG tablet Take 7.5 mg by mouth daily.   8.17.2023 at 1700   Scheduled:   acetylcysteine  4 mL Nebulization BID   albuterol  2.5 mg Nebulization BID   arformoterol  15 mcg Nebulization BID   aspirin  81 mg Per Tube Daily   atorvastatin  80 mg Per Tube Daily   budesonide (PULMICORT) nebulizer solution  0.5 mg Nebulization BID   Chlorhexidine Gluconate Cloth  6 each Topical Q0600   divalproex  250 mg Oral Q12H   docusate  100 mg Per Tube BID   feeding supplement (PROSource TF20)  60 mL Per Tube Daily   feeding supplement (VITAL HIGH PROTEIN)  1,000 mL Per Tube Q24H   heparin  5,000 Units Subcutaneous Q8H   levETIRAcetam  1,500 mg Per Tube Q12H   mouth rinse  15 mL Mouth Rinse Q2H   pantoprazole  40 mg Per Tube Daily   polyethylene glycol  17 g Per Tube Daily   potassium chloride  20 mEq Per Tube Once   warfarin  7.5 mg Per Tube Once   Warfarin - Pharmacist Dosing Inpatient   Does not apply q1600    Assessment: 66 YO male transferred from [facility] 8/19 requiring intubation and continuous EEG monitoring following status epilepticus. The patient has a history of chronic afib. Based on medication history and prior dispensing records, he seems to be managed with warfarin 7.5mg  daily. Med rec unable to be completed at this time due to patient intubation and lack of response from patient's guardian. Based on patient's timing of presentation to ED (~0800 8/19), it is unlikely that he took his warfarin dose 8/19.   INR 1.7, subtherapeutic Last dose of warfarin was 8/17 @ 17:00 Hgb 12.7, Plt 206 - stable No documented signs/symptoms of  bleeding.   Patient also on heparin [place] 5000u TID for VTE prophylaxis.  Goal of Therapy:  INR 2-3 Monitor platelets by anticoagulation protocol: Yes   Plan:  Give warfarin 7.5mg  tonight  Continue Heparin 5000 units [place] q8h until INR > 2 F/u INR with AM labs  Monitor CBC, signs/symptoms of bleeding, INR daily  F/u PTA warfarin regimen  Wilburn Cornelia, PharmD, BCPS Clinical Pharmacist 08/22/2021 3:47 PM   Please refer to AMION for pharmacy phone number;a

## 2021-08-22 NOTE — Progress Notes (Addendum)
NEUROLOGY CONSULTATION NOTE   Date of service: August 22, 2021 Patient Name: Nathan Shepherd MRN:  [id number] DOB:  10-22-1955  History of Present Illness  Nathan Shepherd is a 66 y.o. male with PMH significant for CHF, CKD3, HTN, HLD, seizures on Keppra 1500 BID in the setting of L occipital stroke, afibb on warfarin with INR of 1.7 who was found unresponsive and seizing at group home. Noted R gaze deviation by EMS and given Versed 7.5mg  which did not resolve the seizure. Intubated in the ED and bronched for a mucus plug.  CTH negative for ICH, he was given Keppra 4.5g and started on propofol and transferred to [facility]. Exam by our team at [facility] with pinpoint pupils, no brainstem reflexes and no response to noxious stimuli.  Interval hx: - No seizures on cEEG. - care taker at group home off for the weekend and not able to reach him. Will need to contact again tomorrow to discuss compliance with keppra.   ROS  Unable to obtain 2/2 intubation, sedation and status epilepticus.  Past History   Past Medical History:  Diagnosis Date   CHF (congestive heart failure) (HCC)    CKD (chronic kidney disease) stage 3, GFR 30-59 ml/min (HCC)    Edema    Hyperlipemia    Hypertension    Seizures ([place])    History reviewed. No pertinent surgical history. History reviewed. No pertinent family history. Social History   Socioeconomic History   Marital status: Single    Spouse name: Not on file   Number of children: Not on file   Years of education: Not on file   Highest education level: Not on file  Occupational History   Not on file  Tobacco Use   Smoking status: Never   Smokeless tobacco: Never  Substance and Sexual Activity   Alcohol use: Not on file   Drug use: Not on file   Sexual activity: Not on file  Other Topics Concern   Not on file  Social History Narrative   Not on file   Social Determinants of Health   Financial Resource Strain: Not on file  Food Insecurity: Not  on file  Transportation Needs: Not on file  Physical Activity: Not on file  Stress: Not on file  Social Connections: Not on file   Allergies  Allergen Reactions   Penicillins Hives    Medications   Medications Prior to Admission  Medication Sig Dispense Refill Last Dose   acetaminophen (TYLENOL) 325 MG tablet Take 650 mg by mouth every 6 (six) hours as needed for mild pain.   unk   aspirin EC 81 MG tablet Take 81 mg by mouth daily.   8.18.2023   budesonide-formoterol (SYMBICORT) 160-4.5 MCG/ACT inhaler Inhale 2 puffs into the lungs in the morning and at bedtime.   8.18.2023   carvedilol (COREG) 12.5 MG tablet Take 12.5 mg by mouth 2 (two) times daily with a meal.   8.18.2023 at 0800   donepezil (ARICEPT) 5 MG tablet Take 5 mg by mouth at bedtime.   8.17.2023   levETIRAcetam (KEPPRA) 1000 MG tablet Take 1,000 mg by mouth 2 (two) times daily.   8.18.2023   losartan (COZAAR) 50 MG tablet Take 50 mg by mouth daily.   8.18.2023   NIFEdipine (PROCARDIA XL/NIFEDICAL XL) 60 MG 24 hr tablet Take 60 mg by mouth daily.   8.18.2023   rosuvastatin (CRESTOR) 20 MG tablet Take 20 mg by mouth at bedtime.    8.17.2023  Skin Protectants, Misc. (EUCERIN) cream Apply 1 application  topically daily. Apply to legs and feet once daily   8.18.2023   torsemide (DEMADEX) 20 MG tablet Take 20 mg by mouth daily.   8.18.2023   Vitamin D, Ergocalciferol, (DRISDOL) 1.25 MG (50000 UNIT) CAPS capsule Take 50,000 Units by mouth every 7 (seven) days. Thursday's   8.17.2023   warfarin (COUMADIN) 7.5 MG tablet Take 7.5 mg by mouth daily.   8.17.2023 at 1700      Vitals   Vitals:   08/22/21 0859 08/22/21 0900 08/22/21 1000 08/22/21 1051  BP:  (!) 189/111 (!) 193/106 (!) 141/99  Pulse:  63 68 63  Resp:  20 20 20   Temp:  98.1 F (36.7 C) 98.1 F (36.7 C) 97.7 F (36.5 C)  TempSrc:      SpO2: 100% 100% 98% 98%  Height:         Body mass index is 24.09 kg/m.  Physical Exam   General: Laying comfortably in  bed; in no acute distress.  HENT: Normal oropharynx and mucosa. Normal external appearance of ears and nose.  Neck: Supple, no pain or tenderness  CV: No JVD. No peripheral edema.  Pulmonary: Symmetric Chest rise. Abdomen: Soft to touch, non-tender.  Ext: No cyanosis, edema, or deformity  Skin: No rash. Normal palpation of skin.   Musculoskeletal: Normal digits and nails by inspection. No clubbing.   Neurologic Examination  Mental status/Cognition: eyes closed, opens eyes to voice, does not make eye contact.  Speech/language: limited by intubation and sedation. Mute, no attempts to communicate. Cranial nerves:   CN II Pupils pinpoint but reactive to light..   CN III,IV,VI Dysconjugate upwards gaze, ?weak positive dolls eyes.   CN V Corneals intact BL   CN VII Symmetric facial grimace to nares stimulation,   CN VIII Does not turn head towards speech   CN IX & X Positive cough and gag.   CN XI Moves his head side to side.   CN XII Unable to assess.   Motor/sensory:  Muscle bulk: normal, tone flaccid. Withdraws BL lower extremities to pinch. Spontaneous movements in all extremities.  Reflexes:  Right Left Comments  Pectoralis      Biceps (C5/6) 2 2   Brachioradialis (C5/6) 2 2    Triceps (C6/7) 2 2    Patellar (L3/4) 2 2    Achilles (S1)      Hoffman      Plantar     Jaw jerk    Coordination/Complex Motor:  - unable to assess.  Labs   CBC:  Recent Labs  Lab 08/21/21 0812 08/21/21 1821 08/22/21 0347  WBC 5.2  --  14.3*  NEUTROABS 4.2  --   --   HGB 11.4* 12.9* 12.7*  HCT 36.7* 38.0* 38.9*  MCV 81.6  --  80.5  PLT 222  --  206     Basic Metabolic Panel:  Lab Results  Component Value Date   NA 137 08/22/2021   K 3.3 (L) 08/22/2021   CO2 19 (L) 08/22/2021   GLUCOSE 134 (H) 08/22/2021   BUN 11 08/22/2021   CREATININE 1.43 (H) 08/22/2021   CALCIUM 9.1 08/22/2021   GFRNONAA 55 (L) 08/22/2021   GFRAA 59 (L) 12/15/2016   Lipid Panel:  Lab Results   Component Value Date   LDLCALC 37 08/18/2016   HgbA1c:  Lab Results  Component Value Date   HGBA1C 6.4 (H) 08/18/2016   Urine Drug Screen:  Component Value Date/Time   LABOPIA NONE DETECTED 08/16/2016 2254   COCAINSCRNUR NONE DETECTED 08/16/2016 2254   LABBENZ NONE DETECTED 08/16/2016 2254   AMPHETMU NONE DETECTED 08/16/2016 2254   THCU NONE DETECTED 08/16/2016 2254   LABBARB NONE DETECTED 08/16/2016 2254    Alcohol Level     Component Value Date/Time   ETH <10 08/21/2021 0812    CT Head without contrast(Personally reviewed): CTH was negative for a large hypodensity concerning for a large territory infarct or hyperdensity concerning for an ICH. Old L PCA stroke.   MRI Brain: 1. 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 2. No acute intracranial process. No evidence of acute or subacute infarct.  cEEG:  This study showed evidence of epileptogenicity arising from left temporal region. There is also cortical dysfunction in left hemisphere likely secondary to underlying structural abnormality, post-ictal state. Additionally there is moderate to severe diffuse encephalopathy, nonspecific etiology. No seizures were seen throughout the recording.  rEEG: This study showed evidence of epileptogenicity arising from left temporal region. There is also cortical dysfunction in left hemisphere likely secondary to underlying structural abnormality, post-ictal state. Additionally there is severe diffuse encephalopathy, nonspecific etiology. No seizures were seen throughout the recording.  Impression   Nathan Shepherd is a 66 y.o. male with PMH significant for CHF, CKD3, HTN, HLD, seizures on Keppra 1500 BID in the setting of L occipital stroke, afibb on warfarin with INR of 1.7 who was found unresponsive and  presented to [facility] in status epilepticus. Had a ?seizure in the Neuro ICU before being hooked up to cEEG.  No seizures on cEEG.  Recommendations  - cEEG - continue Keppra 1500mg  Q12hours. - continue VPA 500mg  qhs given unclear compliance with Keppra so presuming for now that he has been taking it at his group home. - wean off propofol gradually by 70mcg/kg/min every hour until off. Will keep cEEG on until Propofol weaned off and no seizures. - MRI brain without contrast with left mesiotemporal sclerosis. - Seizure precautions  This patient is critically ill and at significant risk of neurological worsening, death and care requires constant monitoring of vital signs, hemodynamics,respiratory and cardiac monitoring, neurological assessment, discussion with family, other specialists and medical decision making of high complexity. I spent 35 minutes of neurocritical care time  in the care of  this patient. This was time spent independent of any time provided by nurse practitioner or PA.  [facility] Pager Number [phone] 08/22/2021  12:18 PM  _____________________   Thank you for the opportunity to take part in the care of this patient. If you have any further questions, please contact the neurology consultation attending.  Signed,  [phone] [facility] Pager Number 08/24/2021 _ _ _   _ __   _ __ _ _  __ __   _ __   __ _

## 2021-08-22 NOTE — Progress Notes (Signed)
NAME:  Nathan Shepherd, MRN:  [id number], DOB:  06-Oct-1955, LOS: 1 ADMISSION DATE:  08/21/2021, CONSULTATION DATE: 8/19 REFERRING MD: Dr. Belia Heman, CHIEF COMPLAINT: Seizure  History of Present Illness:  66 year old male who presented to [facility] on 8/19 with reports of status epilepticus.  The patient is a group home resident and was found unresponsive.  EMS was activated and found him to be hypoxic and actively seizing with a rightward gaze.  He was given a total of 7.5 mg Versed in route but continued to seize.  He has had no purposeful movement with EMS observation.  He was intubated emergently in the ER and placed on mechanical ventilation.  Initial chest x-ray concerning for mucous plugging with right-sided opacity.  He was emergently bronched at bedside in the emergency room with extensive mucus plugging removed from the right lung. The patient was loaded with keppra in the ER.  He was treated as a severe COPE exacerbation with IV steroids, and nebulized bronchodilators.  In addition to seizure and resp failure,  he had an AKI with sr Cr of 1.52.  The patient was seen by Neurology and recommended transfer to [facility] for continuous EEG monitoring.    PCCM consulted for admission.   Pertinent  Medical History  Seizure disorder HTN HLD CKD 3 CHF  Significant Hospital Events: Including procedures, antibiotic start and stop dates in addition to other pertinent events   8/19 Presented to [facility] in status, R sided mucus plugging. Intubated.  Tx to [facility]  Interim History / Subjective:  No active seizures per EEG report, patient remained on propofol EEG showing epileptogenicity arising from left temporal region Remained afebrile  Objective   Blood pressure (!) 189/111, pulse 63, temperature 98.1 F (36.7 C), resp. rate 20, height 6\' 3"  (1.905 m), SpO2 100 %.    Vent Mode: PRVC FiO2 (%):  [40 %-100 %] 40 % Set Rate:  [14 bmp-20 bmp] 20 bmp Vt Set:  [550 mL-670 mL] 670 mL PEEP:  [5 cmH20-12  cmH20] 5 cmH20 Plateau Pressure:  [16 cmH20-18 cmH20] 18 cmH20   Intake/Output Summary (Last 24 hours) at 08/22/2021 1025 Last data filed at 08/22/2021 0700 Gross per 24 hour  Intake 849.62 ml  Output 1750 ml  Net -900.38 ml   There were no vitals filed for this visit.  Examination: General: Acute chronically ill appearing adult male lying in bed, orally intubated HENT: MM pink/moist, ETT, pupils pinpoint - left with upward gaze, right forward. +Corneal response, cough and gag reflexes present Lungs: non-labored on vent, clear on left, bronchial breath sounds on right  Cardiovascular: s1s2 RRR, no m/r/g Abdomen: soft, non-tender, bsx4 active  Extremities: warm/dry, no significant edema  Neuro: sedate on propofol  Resolved Hospital Problem list     Assessment & Plan:  Status Epilepticus  Prior history of seizures, L Occipital CVA, Multi-infarct Dementia, Prior Polysubstance Abuse  Acute Encephalopathy secondary to Seizure  Patient presented with breakthrough seizures At home he is on Keppra 1500 mg twice daily Now EEG showing no active seizures but epileptogenicity and high risk of seizures Patient is on propofol infusion Continue Keppra 1500 mg twice daily Plan to start valproic acid per neurology Hold donepezil for now Avoid deep sedation Appreciated neurology follow-up  Acute Hypoxic Respiratory Failure  Acute Mucus Plugging, Suspected Aspiration  Pulmonary Nodules Enlarged Pulmonary Arteries Never smoker. CT [facility] with pneumomediastinum, pulmonary nodules, diffuse R GGO Continue lung protective ventilation Post bronchoscopy x-ray looks better but still has multifocal  infiltrates on the right side PAD protocol with propofol Continue mucomyst + albuterol BID x 2 days  Continue chest PT -brovna + pulmicort in lieu of symbicort   Chronic AF on Warfarin Hx HTN, HLD Continue ASA, lipitor -hold home cozaar, nifedipine, demadex, coreg  -coumadin per pharmacy   AKI  on CKD IIIa Hypokalemia/hypophosphatemia/hypomagnesemia Serum creatinine continue to trend down slowly Aggressively supplement electrolytes and monitor Avoid nephrotoxic agents, ensure adequate renal perfusion  Anemia of chronic disease Monitor H&H and transfuse as needed  Best Practice (right click and "Reselect all SmartList Selections" daily)  Diet/type: tubefeeds and NPO DVT prophylaxis: Coumadin GI prophylaxis: PPI Lines: N/A Foley:  N/A Code Status:  full code Last date of multidisciplinary goals of care discussion: Unable to reach family   Labs   CBC: Recent Labs  Lab 08/21/21 0812 08/21/21 1821 08/22/21 0347  WBC 5.2  --  14.3*  NEUTROABS 4.2  --   --   HGB 11.4* 12.9* 12.7*  HCT 36.7* 38.0* 38.9*  MCV 81.6  --  80.5  PLT 222  --  206    Basic Metabolic Panel: Recent Labs  Lab 08/21/21 0812 08/21/21 1821 08/21/21 1852 08/22/21 0347  NA 140 140  --  137  K 3.4* 3.0*  --  3.3*  CL 106  --   --  105  CO2 27  --   --  19*  GLUCOSE 107*  --   --  134*  BUN 14  --   --  11  CREATININE 1.52*  --   --  1.43*  CALCIUM 9.1  --   --  9.1  MG 1.9  --  1.7 1.7  PHOS  --   --  1.4* <1.0*   GFR: Estimated Creatinine Clearance: 63.2 mL/min (A) (by C-G formula based on SCr of 1.43 mg/dL (H)). Recent Labs  Lab 08/21/21 0812 08/21/21 1019 08/22/21 0347  WBC 5.2  --  14.3*  LATICACIDVEN 3.7* 3.2*  --     Liver Function Tests: Recent Labs  Lab 08/21/21 0812  AST 22  ALT 14  ALKPHOS 77  BILITOT 0.5  PROT 7.1  ALBUMIN 3.8   No results for input(s): "LIPASE", "AMYLASE" in the last 168 hours. No results for input(s): "AMMONIA" in the last 168 hours.  ABG    Component Value Date/Time   PHART 7.495 (H) 08/21/2021 1821   PCO2ART 27.8 (L) 08/21/2021 1821   PO2ART 126 (H) 08/21/2021 1821   HCO3 21.4 08/21/2021 1821   TCO2 22 08/21/2021 1821   ACIDBASEDEF 1.0 08/21/2021 1821   O2SAT 99 08/21/2021 1821     Coagulation Profile: Recent Labs  Lab  08/21/21 0812 08/22/21 0347  INR 1.7* 1.7*    Cardiac Enzymes: No results for input(s): "CKTOTAL", "CKMB", "CKMBINDEX", "TROPONINI" in the last 168 hours.  HbA1C: Hgb A1c MFr Bld  Date/Time Value Ref Range Status  08/18/2016 02:35 AM 6.4 (H) 4.8 - 5.6 % Final    Comment:    (NOTE) Pre diabetes:          5.7%-6.4% Diabetes:              >6.4% Glycemic control for   <7.0% adults with diabetes     CBG: Recent Labs  Lab 08/21/21 1617 08/21/21 2023 08/21/21 2355 08/22/21 0453 08/22/21 0808  GLUCAP 140* 157* 163* 116* 103*   Total critical care time: 36 minutes  Performed by: Cheri Fowler   Critical care time was exclusive of  separately billable procedures and treating other patients.   Critical care was necessary to treat or prevent imminent or life-threatening deterioration.   Critical care was time spent personally by me on the following activities: development of treatment plan with patient and/or surrogate as well as nursing, discussions with consultants, evaluation of patient's response to treatment, examination of patient, obtaining history from patient or surrogate, ordering and performing treatments and interventions, ordering and review of laboratory studies, ordering and review of radiographic studies, pulse oximetry and re-evaluation of patient's condition.   Cheri Fowler, MD [facility] See Amion for pager If no response to pager, please call [phone] until 7pm After 7pm, Please call E-link [phone]

## 2021-08-22 NOTE — Procedures (Signed)
Patient Name: Nathan Shepherd  MRN: [id number]  Epilepsy Attending: Charlsie Quest  Referring Physician/Provider: Erick Blinks, MD   Duration: 08/21/2021 1957 to 08/22/2021 1957  Patient history: 66 y.o. male with PMH significant for CHF, CKD3, HTN, HLD, seizures on Keppra 1500 BID in the setting of L occipital stroke, afibb on warfarin with INR of 1.7 who was found unresponsive and presented to [facility] in status epilepticus. EEG to evaluate for seizure.   Level of alertness: lethargic   AEDs during EEG study: LEV, VPA, propofol  Technical aspects: This EEG study was done with scalp electrodes positioned according to the 10-20 International system of electrode placement. Electrical activity was reviewed with band pass filter of 1-70Hz , sensitivity of 7 uV/mm, display speed of 78mm/sec with a 60Hz  notched filter applied as appropriate. EEG data were recorded continuously and digitally stored.  Video monitoring was available and reviewed as appropriate.  Description: At the beginning of study, EEG showed continuous generalized and lateralized left hemisphere 2-3hz  delta slowing admixed with 15-18Hz  generalized beta activity. Gradually, EEG showed continuous generalized and lateralized left hemisphere 3-7hz  theta-delta slowing. Abundant sharp waves were noted in left temporal region. Hyperventilation and photic stimulation were not performed.     ABNORMALITY - Sharp wave, left temporal region - Continuous slow, generalized and lateralized left hemisphere   IMPRESSION: This study showed evidence of epileptogenicity arising from left temporal region. There is also cortical dysfunction in left hemisphere likely secondary to underlying structural abnormality, post-ictal state. Additionally there is moderate to severe diffuse encephalopathy, nonspecific etiology. No seizures were seen throughout the recording.  Jamy Whyte 

## 2021-08-22 NOTE — Progress Notes (Signed)
RT and RN transported from 4N32 to MRI and back to room without any complications.

## 2021-08-23 DIAGNOSIS — G40901 Epilepsy, unspecified, not intractable, with status epilepticus: Secondary | ICD-10-CM | POA: Diagnosis not present

## 2021-08-23 DIAGNOSIS — J9600 Acute respiratory failure, unspecified whether with hypoxia or hypercapnia: Secondary | ICD-10-CM | POA: Diagnosis not present

## 2021-08-23 LAB — GLUCOSE, CAPILLARY
Glucose-Capillary: 102 mg/dL — ABNORMAL HIGH (ref 70–99)
Glucose-Capillary: 95 mg/dL (ref 70–99)
Glucose-Capillary: 119 mg/dL — ABNORMAL HIGH (ref 70–99)
Glucose-Capillary: 95 mg/dL (ref 70–99)
Glucose-Capillary: 111 mg/dL — ABNORMAL HIGH (ref 70–99)

## 2021-08-23 LAB — MAGNESIUM: Magnesium: 2 mg/dL (ref 1.7–2.4)

## 2021-08-23 LAB — PROTIME-INR
INR: 2.1 — ABNORMAL HIGH (ref 0.8–1.2)
Prothrombin Time: 22.9 seconds — ABNORMAL HIGH (ref 11.4–15.2)

## 2021-08-23 LAB — PHOSPHORUS: Phosphorus: 1.9 mg/dL — ABNORMAL LOW (ref 2.5–4.6)

## 2021-08-23 LAB — LEVETIRACETAM LEVEL: Levetiracetam Lvl: 33.1 ug/mL (ref 10.0–40.0)

## 2021-08-23 MED ORDER — DEXTROSE 5 % IV SOLN
500.0000 mg | Freq: Two times a day (BID) | INTRAVENOUS | Status: DC
  Filled 2021-08-23: qty 5

## 2021-08-23 MED ORDER — DIVALPROEX SODIUM 500 MG PO DR TAB: 500.0000 mg | DELAYED_RELEASE_TABLET | Freq: Two times a day (BID) | ORAL | Status: DC

## 2021-08-23 MED ORDER — WARFARIN SODIUM 7.5 MG PO TABS
7.5000 mg | ORAL_TABLET | Freq: Once | ORAL | Status: AC
  Administered 2021-08-23: 7.5 mg
  Filled 2021-08-23: qty 1

## 2021-08-23 MED ORDER — SODIUM PHOSPHATES 45 MMOLE/15ML IV SOLN
30.0000 mmol | Freq: Once | INTRAVENOUS | Status: AC
  Administered 2021-08-23: 30 mmol via INTRAVENOUS
  Filled 2021-08-23: qty 10

## 2021-08-23 MED ORDER — VALPROATE SODIUM 100 MG/ML IV SOLN
500.0000 mg | Freq: Two times a day (BID) | INTRAVENOUS | Status: DC
  Administered 2021-08-23 (×2): 500 mg via INTRAVENOUS
  Filled 2021-08-23 (×5): qty 5

## 2021-08-23 MED ORDER — PIPERACILLIN-TAZOBACTAM 3.375 G IVPB
3.3750 g | Freq: Three times a day (TID) | INTRAVENOUS | Status: DC
  Administered 2021-08-23 – 2021-08-27 (×13): 3.375 g via INTRAVENOUS
  Filled 2021-08-23 (×13): qty 50

## 2021-08-23 MED ORDER — DIVALPROEX SODIUM 250 MG PO DR TAB
500.0000 mg | DELAYED_RELEASE_TABLET | Freq: Two times a day (BID) | ORAL | Status: DC
  Administered 2021-08-24 – 2021-08-28 (×9): 500 mg via ORAL
  Filled 2021-08-23 (×3): qty 2
  Filled 2021-08-23 (×2): qty 1
  Filled 2021-08-23: qty 2
  Filled 2021-08-23: qty 1
  Filled 2021-08-23 (×4): qty 2
  Filled 2021-08-23: qty 1

## 2021-08-23 NOTE — Social Work (Addendum)
RN requested clarification on legal guardian. CSW spoke with Nathan Shepherd [phone]) who is pt's DSS guardian. Nathan Shepherd confirmed she is the guardian and CSW requested documentation be sent over. This will be put on chart when available. CSW put a flag on the chart and updated emergency contacts. TOC is available for any further needs.

## 2021-08-23 NOTE — Progress Notes (Signed)
LTM EEG D/C'd per Dr Wilford Corner, no skin breakdown noted.

## 2021-08-23 NOTE — Progress Notes (Addendum)
NEUROLOGY CONSULTATION NOTE   Date of service: August 23, 2021 Patient Name: Nathan Shepherd MRN:  [id number] DOB:  December 25, 1955  History of Present Illness  Nathan Shepherd is a 66 y.o. male with PMH significant for CHF, CKD3, HTN, HLD, seizures on Keppra 1500 BID in the setting of L occipital stroke, afib on warfarin with INR of 1.7 who was found unresponsive and seizing at group home. Noted R gaze deviation by EMS and given Versed 7.5mg  which did not resolve the seizure. Intubated in the ED and bronched for a mucus plug.  CTH negative for ICH, he was given Keppra 4.5g and started on propofol and transferred to [facility]. Exam by our team at [facility] with pinpoint pupils, no brainstem reflexes and no response to noxious stimuli.  Interval hx: - No seizures on cEEG. .   ROS  Unable to obtain 2/2 intubation, sedation and status epilepticus.  Past History   Past Medical History:  Diagnosis Date   CHF (congestive heart failure) (HCC)    CKD (chronic kidney disease) stage 3, GFR 30-59 ml/min (HCC)    Edema    Hyperlipemia    Hypertension    Seizures ([place])    History reviewed. No pertinent surgical history. History reviewed. No pertinent family history. Social History   Socioeconomic History   Marital status: Single    Spouse name: Not on file   Number of children: Not on file   Years of education: Not on file   Highest education level: Not on file  Occupational History   Not on file  Tobacco Use   Smoking status: Never   Smokeless tobacco: Never  Substance and Sexual Activity   Alcohol use: Not on file   Drug use: Not on file   Sexual activity: Not on file  Other Topics Concern   Not on file  Social History Narrative   Not on file   Social Determinants of Health   Financial Resource Strain: Not on file  Food Insecurity: Not on file  Transportation Needs: Not on file  Physical Activity: Not on file  Stress: Not on file  Social Connections: Not on file    Allergies  Allergen Reactions   Penicillins Hives    Medications   Medications Prior to Admission  Medication Sig Dispense Refill Last Dose   acetaminophen (TYLENOL) 325 MG tablet Take 650 mg by mouth every 6 (six) hours as needed for mild pain.   unk   aspirin EC 81 MG tablet Take 81 mg by mouth daily.   8.18.2023   budesonide-formoterol (SYMBICORT) 160-4.5 MCG/ACT inhaler Inhale 2 puffs into the lungs in the morning and at bedtime.   8.18.2023   carvedilol (COREG) 12.5 MG tablet Take 12.5 mg by mouth 2 (two) times daily with a meal.   8.18.2023 at 0800   donepezil (ARICEPT) 5 MG tablet Take 5 mg by mouth at bedtime.   8.17.2023   levETIRAcetam (KEPPRA) 1000 MG tablet Take 1,000 mg by mouth 2 (two) times daily.   8.18.2023   losartan (COZAAR) 50 MG tablet Take 50 mg by mouth daily.   8.18.2023   NIFEdipine (PROCARDIA XL/NIFEDICAL XL) 60 MG 24 hr tablet Take 60 mg by mouth daily.   8.18.2023   rosuvastatin (CRESTOR) 20 MG tablet Take 20 mg by mouth at bedtime.    8.17.2023   Skin Protectants, Misc. (EUCERIN) cream Apply 1 application  topically daily. Apply to legs and feet once daily   8.18.2023   torsemide (  DEMADEX) 20 MG tablet Take 20 mg by mouth daily.   8.18.2023   Vitamin D, Ergocalciferol, (DRISDOL) 1.25 MG (50000 UNIT) CAPS capsule Take 50,000 Units by mouth every 7 (seven) days. Thursday's   8.17.2023   warfarin (COUMADIN) 7.5 MG tablet Take 7.5 mg by mouth daily.   8.17.2023 at 1700      Vitals   Vitals:   08/23/21 0812 08/23/21 0820 08/23/21 0825 08/23/21 0900  BP:    (!) 140/98  Pulse:    74  Resp:    19  Temp:    (!) 100.6 F (38.1 C)  TempSrc:      SpO2: 100% 100% 100% 99%  Height:         Body mass index is 24.09 kg/m.  Physical Exam  General: Intubated on no sedation HNT: Normocephalic atraumatic CVS: Irregularly irregular Respiratory: Vented Abdomen nondistended nontender Neurological Intubated, no sedation Follows commands Cranial nerves:  Pupils equal round react light, extract movements intact, visual fields full, facial symmetry difficult to ascertain. Motor examination with antigravity strength in all 4 extremities. Sensation intact to light touch Coordination difficult to assess given mentation.  Labs   CBC:  Recent Labs  Lab 08/21/21 0812 08/21/21 1821 08/22/21 0347  WBC 5.2  --  14.3*  NEUTROABS 4.2  --   --   HGB 11.4* 12.9* 12.7*  HCT 36.7* 38.0* 38.9*  MCV 81.6  --  80.5  PLT 222  --  206     Basic Metabolic Panel:  Lab Results  Component Value Date   NA 137 08/22/2021   K 3.3 (L) 08/22/2021   CO2 19 (L) 08/22/2021   GLUCOSE 134 (H) 08/22/2021   BUN 11 08/22/2021   CREATININE 1.43 (H) 08/22/2021   CALCIUM 9.1 08/22/2021   GFRNONAA 55 (L) 08/22/2021   GFRAA 59 (L) 12/15/2016   Lipid Panel:  Lab Results  Component Value Date   LDLCALC 37 08/18/2016   HgbA1c:  Lab Results  Component Value Date   HGBA1C 6.4 (H) 08/18/2016   Urine Drug Screen:     Component Value Date/Time   LABOPIA NONE DETECTED 08/16/2016 2254   COCAINSCRNUR NONE DETECTED 08/16/2016 2254   LABBENZ NONE DETECTED 08/16/2016 2254   AMPHETMU NONE DETECTED 08/16/2016 2254   THCU NONE DETECTED 08/16/2016 2254   LABBARB NONE DETECTED 08/16/2016 2254    Alcohol Level     Component Value Date/Time   ETH <10 08/21/2021 0812    CT Head without contrast(Personally reviewed): CTH was negative for a large hypodensity concerning for a large territory infarct or hyperdensity concerning for an ICH. Old L PCA stroke.   MRI Brain: 1. 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 2. No acute intracranial process. No evidence of acute or subacute infarct.  cEEG over the last 24 hours This study showed evidence of epileptogenicity arising from left temporal region.  There is also cortical dysfunction in left hemisphere likely secondary to underlying structural abnormality, post-ictal state. Additionally there is moderate diffuse encephalopathy, nonspecific etiology. No seizures were seen throughout the recording.  rEEG: This study showed evidence of epileptogenicity arising from left temporal region. There is also cortical dysfunction in left hemisphere likely secondary to underlying structural abnormality, post-ictal state. Additionally there is severe diffuse encephalopathy, nonspecific etiology. No seizures were seen throughout the recording.  Impression  Nathan Shepherd is a 66 y.o. male with PMH significant for CHF, CKD3, HTN, HLD, seizures on Keppra 1500 BID in the setting of L occipital stroke, afib on warfarin with INR of 1.7 who was found unresponsive and presented to [facility] in status epilepticus. Had a ?seizure in the Neuro ICU before being hooked up to cEEG. No seizures on cEEG. Following commands and getting ready to be weaned off the ventilator Reports compliance to medications by nodding yes and no appropriately to questions. Valproate added during this admission to Keppra.  Impression Breakthrough seizure and status epilepticus-resolved Acute hypoxic respiratory failure due to the above  Recommendations  - cEEG - continue Keppra 1500mg  Q12hours. - continue VPA 500mg  BID - Check VPA level in the AM  - MRI brain without contrast with left mesiotemporal sclerosis. - Seizure precautions  Extubation per primary team.  Discussed with PCCM team and Dr.  -- , MD Neurologist [facility] Pager: [phone]  CRITICAL CARE ATTESTATION Performed by: Milon Dikes, MD Total critical care time: 39 minutes Critical care time was exclusive of separately billable procedures and treating other patients and/or supervising APPs/Residents/Students Critical care was necessary to treat or prevent imminent or  life-threatening deterioration due to seizures, status epilpeticus  This patient is critically ill and at significant risk for neurological worsening and/or death and care requires constant monitoring. Critical care was time spent personally by me on the following activities: development of treatment plan with patient and/or surrogate as well as nursing, discussions with consultants, evaluation of patient's response to treatment, examination of patient, obtaining history from patient or surrogate, ordering and performing treatments and interventions, ordering and review of laboratory studies, ordering and review of radiographic studies, pulse oximetry, re-evaluation of patient's condition, participation in multidisciplinary rounds and medical decision making of high complexity in the care of this patient.

## 2021-08-23 NOTE — Progress Notes (Signed)
NAME:  Nathan Shepherd, MRN:  [id number], DOB:  [birth year], LOS: 2 ADMISSION DATE:  08/21/2021, CONSULTATION DATE: 8/19 REFERRING MD: Dr. Belia Heman, CHIEF COMPLAINT: Seizure  History of Present Illness:  66 year old male who presented to [facility] on 8/19 with reports of status epilepticus.  The patient is a group home resident and was found unresponsive.  EMS was activated and found him to be hypoxic and actively seizing with a rightward gaze.  He was given a total of 7.5 mg Versed in route but continued to seize.  He has had no purposeful movement with EMS observation.  He was intubated emergently in the ER and placed on mechanical ventilation.  Initial chest x-ray concerning for mucous plugging with right-sided opacity.  He was emergently bronched at bedside in the emergency room with extensive mucus plugging removed from the right lung. The patient was loaded with keppra in the ER.  He was treated as a severe COPE exacerbation with IV steroids, and nebulized bronchodilators.  In addition to seizure and resp failure,  he had an AKI with sr Cr of 1.52.  The patient was seen by Neurology and recommended transfer to [facility] for continuous EEG monitoring.    PCCM consulted for admission.   Pertinent  Medical History  Seizure disorder HTN HLD CKD 3 CHF  Significant Hospital Events: Including procedures, antibiotic start and stop dates in addition to other pertinent events   8/19 Presented to [facility] in status, R sided mucus plugging. Intubated.  Tx to [facility]  Interim History / Subjective:  EEG showed no seizures Sedation was turned off Patient started spiking fever with Tmax 100.4  Objective   Blood pressure 117/80, pulse 60, temperature (!) 100.4 F (38 C), resp. rate 18, height 6\' 3"  (1.905 m), SpO2 100 %.    Vent Mode: PRVC FiO2 (%):  [40 %] 40 % Set Rate:  [20 bmp] 20 bmp Vt Set:  [670 mL] 670 mL PEEP:  [5 cmH20] 5 cmH20 Plateau Pressure:  [17 cmH20-19 cmH20] 19 cmH20   Intake/Output  Summary (Last 24 hours) at 08/23/2021 0800 Last data filed at 08/23/2021 0600 Gross per 24 hour  Intake 1793.66 ml  Output 700 ml  Net 1093.66 ml   There were no vitals filed for this visit.  Examination: General: Acute on chronically ill appearing adult male lying in bed, orally intubated HENT: MM pink/moist, ETT, pupils pinpoint  Lungs: non-labored on vent, clear on left, bronchial breath sounds on right  Cardiovascular: s1s2 RRR, no m/r/g Abdomen: soft, non-tender, bsx4 active  Extremities: warm/dry, no significant edema  Neuro: Awake, following commands, moving all 4 extremities  Resolved Hospital Problem list     Assessment & Plan:  Status Epilepticus  Prior history of seizures, L Occipital CVA, Multi-infarct Dementia, Prior Polysubstance Abuse  Acute Encephalopathy secondary to Seizure  Patient presented with breakthrough seizures At home he is on Keppra 1500 mg twice daily, stated compliance with meds EEG showing no active seizures but epileptogenicity and high risk of seizures Propofol was stopped, patient is off sedation completely Continue Keppra 1500 mg twice daily Continue valproic acid per discussion with neurology team Hold donepezil for now Avoid deep sedation  Acute Hypoxic Respiratory Failure  Acute Mucus Plugging, Suspected Aspiration pneumonia Pulmonary Nodules Enlarged Pulmonary Arteries Never smoker. CT at [facility] with pneumomediastinum, pulmonary nodules, diffuse R GGO Continue lung protective ventilation Post bronchoscopy x-ray looks better but still has multifocal infiltrates on the right side Continue IV antibiotics with Zosyn Follow-up respiratory culture  Continue mucomyst + albuterol BID x 2 days  Continue chest PT -brovna + pulmicort in lieu of symbicort  Patient is tolerating supportive breathing trial, will try to extubate him today  Chronic AF on Warfarin Hx HTN, HLD Continue ASA, lipitor INR is therapeutic Pharmacy is managing Coumadin  dosing Resume home cozaar, nifedipine, demadex, coreg once extubated  AKI on CKD IIIa Hypokalemia/hypophosphatemia/hypomagnesemia Serum creatinine continue to trend down Aggressively supplement electrolytes and monitor Avoid nephrotoxic agents, ensure adequate renal perfusion  Anemia of chronic disease Monitor H&H and transfuse as needed  Best Practice (right click and "Reselect all SmartList Selections" daily)  Diet/type: tubefeeds and NPO DVT prophylaxis: Coumadin GI prophylaxis: PPI Lines: N/A Foley:  N/A Code Status:  full code Last date of multidisciplinary goals of care discussion: Unable to reach family   Labs   CBC: Recent Labs  Lab 08/21/21 0812 08/21/21 1821 08/22/21 0347  WBC 5.2  --  14.3*  NEUTROABS 4.2  --   --   HGB 11.4* 12.9* 12.7*  HCT 36.7* 38.0* 38.9*  MCV 81.6  --  80.5  PLT 222  --  206    Basic Metabolic Panel: Recent Labs  Lab 08/21/21 0812 08/21/21 1821 08/21/21 1852 08/22/21 0347 08/22/21 1935 08/23/21 0656  NA 140 140  --  137  --   --   K 3.4* 3.0*  --  3.3*  --   --   CL 106  --   --  105  --   --   CO2 27  --   --  19*  --   --   GLUCOSE 107*  --   --  134*  --   --   BUN 14  --   --  11  --   --   CREATININE 1.52*  --   --  1.43*  --   --   CALCIUM 9.1  --   --  9.1  --   --   MG 1.9  --  1.7 1.7 2.4 2.0  PHOS  --   --  1.4* <1.0* 2.6 1.9*   GFR: Estimated Creatinine Clearance: 63.2 mL/min (A) (by C-G formula based on SCr of 1.43 mg/dL (H)). Recent Labs  Lab 08/21/21 0812 08/21/21 1019 08/22/21 0347  WBC 5.2  --  14.3*  LATICACIDVEN 3.7* 3.2*  --     Liver Function Tests: Recent Labs  Lab 08/21/21 0812  AST 22  ALT 14  ALKPHOS 77  BILITOT 0.5  PROT 7.1  ALBUMIN 3.8   No results for input(s): "LIPASE", "AMYLASE" in the last 168 hours. No results for input(s): "AMMONIA" in the last 168 hours.  ABG    Component Value Date/Time   PHART 7.495 (H) 08/21/2021 1821   PCO2ART 27.8 (L) 08/21/2021 1821   PO2ART  126 (H) 08/21/2021 1821   HCO3 21.4 08/21/2021 1821   TCO2 22 08/21/2021 1821   ACIDBASEDEF 1.0 08/21/2021 1821   O2SAT 99 08/21/2021 1821     Coagulation Profile: Recent Labs  Lab 08/21/21 0812 08/22/21 0347 08/23/21 0656  INR 1.7* 1.7* 2.1*    Cardiac Enzymes: No results for input(s): "CKTOTAL", "CKMB", "CKMBINDEX", "TROPONINI" in the last 168 hours.  HbA1C: Hgb A1c MFr Bld  Date/Time Value Ref Range Status  08/18/2016 02:35 AM 6.4 (H) 4.8 - 5.6 % Final    Comment:    (NOTE) Pre diabetes:          5.7%-6.4% Diabetes:              >  6.4% Glycemic control for   <7.0% adults with diabetes     CBG: Recent Labs  Lab 08/22/21 1133 08/22/21 1553 08/22/21 1953 08/22/21 2335 08/23/21 0340  GLUCAP 95 108* 86 80 102*   Total critical care time: 33 minutes  Performed by: Cheri Fowler   Critical care time was exclusive of separately billable procedures and treating other patients.   Critical care was necessary to treat or prevent imminent or life-threatening deterioration.   Critical care was time spent personally by me on the following activities: development of treatment plan with patient and/or surrogate as well as nursing, discussions with consultants, evaluation of patient's response to treatment, examination of patient, obtaining history from patient or surrogate, ordering and performing treatments and interventions, ordering and review of laboratory studies, ordering and review of radiographic studies, pulse oximetry and re-evaluation of patient's condition.   Cheri Fowler, MD [facility] See Amion for pager If no response to pager, please call [phone] until 7pm After 7pm, Please call E-link [phone]

## 2021-08-23 NOTE — Progress Notes (Signed)
Sputum sample obtained and sent to lab.  

## 2021-08-23 NOTE — Progress Notes (Signed)
Pharmacy Antibiotic Note  Nathan Shepherd is a 66 y.o. male admitted on 08/21/2021 with for seizures. Concern for aspiration pneumonia with fever up to 100.4 overnight. No trend in WBC due to unavailable labs this morning. Respiratory culture has been sent. Of note, patient has a history of a penicillin allergy however per chart review patient has tolerated Zosyn earlier in this admission.   Plan: Zosyn 3.375g IV q8h (4 hour infusion). Follow cultures for de-escalation.   Height: 6\' 3"  (190.5 cm) IBW/kg (Calculated) : 84.5  Temp (24hrs), Avg:98.3 F (36.8 C), Min:97 F (36.1 C), Max:100.4 F (38 C)  Recent Labs  Lab 08/21/21 0812 08/21/21 1019 08/22/21 0347  WBC 5.2  --  14.3*  CREATININE 1.52*  --  1.43*  LATICACIDVEN 3.7* 3.2*  --     Estimated Creatinine Clearance: 63.2 mL/min (A) (by C-G formula based on SCr of 1.43 mg/dL (H)).    Allergies  Allergen Reactions   Penicillins Hives    Thank you for allowing pharmacy to be a part of this patient's care.  08/24/21 08/23/2021 8:06 AM

## 2021-08-23 NOTE — Procedures (Signed)
Extubation Procedure Note  Patient Details:   Name: Nathan Shepherd DOB: 1955-02-07 MRN: [id number]   Airway Documentation:    Vent end date: 08/23/21 Vent end time: 1315   Evaluation  O2 sats: stable throughout Complications: No apparent complications Patient did tolerate procedure well. Bilateral Breath Sounds: Diminished   Yes  Patient tolerated wean. MD ordered to extubate. Positive for cuff leak. Patient extubated to a 4 Lpm nasal cannula. No signs of dyspnea or stridor noted. RN at bedside. Patient resting comfortably.   Ancil Boozer 08/23/2021, 3:40 PM

## 2021-08-23 NOTE — Procedures (Addendum)
Patient Name: Nathan Shepherd  MRN: [id number]  Epilepsy Attending: Charlsie Quest  Referring Physician/Provider: Erick Blinks, MD   Duration: 08/22/2021 1957 to 08/23/2021 1032   Patient history: 66 y.o. male with PMH significant for CHF, CKD3, HTN, HLD, seizures on Keppra 1500 BID in the setting of L occipital stroke, afibb on warfarin with INR of 1.7 who was found unresponsive and presented to [facility] in status epilepticus. EEG to evaluate for seizure.    Level of alertness: lethargic     AEDs during EEG study: LEV, VPA   Technical aspects: This EEG study was done with scalp electrodes positioned according to the 10-20 International system of electrode placement. Electrical activity was reviewed with band pass filter of 1-70Hz , sensitivity of 7 uV/mm, display speed of 9mm/sec with a 60Hz  notched filter applied as appropriate. EEG data were recorded continuously and digitally stored.  Video monitoring was available and reviewed as appropriate.   Description: EEG showed continuous generalized and lateralized left hemisphere 3-7hz  theta-delta slowing. Abundant sharp waves were noted in left temporal region. Hyperventilation and photic stimulation were not performed.      ABNORMALITY - Sharp wave, left temporal region - Continuous slow, generalized and lateralized left hemisphere    IMPRESSION: This study showed evidence of epileptogenicity arising from left temporal region. There is also cortical dysfunction in left hemisphere likely secondary to underlying structural abnormality, post-ictal state. Additionally there is moderate diffuse encephalopathy, nonspecific etiology. No seizures were seen throughout the recording.   Alohilani Levenhagen 

## 2021-08-23 NOTE — Progress Notes (Signed)
eLink Physician-Brief Progress Note Patient Name: Nathan Shepherd DOB: Jun 22, 1955 MRN: [id number]   Date of Service  08/23/2021  HPI/Events of Note  Phos 1.9. No repletion during day shift  eICU Interventions  Replete with NaPhos 30 mmol F/u AM labs     Intervention Category Minor Interventions: Electrolytes abnormality - evaluation and management  [name] Mechele Collin 08/23/2021, 9:06 PM

## 2021-08-23 NOTE — Progress Notes (Addendum)
ANTICOAGULATION CONSULT NOTE - Follow up   Pharmacy Consult for warfarin Indication: atrial fibrillation  Allergies  Allergen Reactions   Penicillins Hives    Patient Measurements: Height: 6\' 3"  (190.5 cm) IBW/kg (Calculated) : 84.5  Vital Signs: Temp: 100.6 F (38.1 C) (08/21 0900) BP: 140/98 (08/21 0900) Pulse Rate: 74 (08/21 0900)  Labs: Recent Labs    08/21/21 0812 08/21/21 1019 08/21/21 1821 08/22/21 0347 08/23/21 0656  HGB 11.4*  --  12.9* 12.7*  --   HCT 36.7*  --  38.0* 38.9*  --   PLT 222  --   --  206  --   LABPROT 19.6*  --   --  20.2* 22.9*  INR 1.7*  --   --  1.7* 2.1*  CREATININE 1.52*  --   --  1.43*  --   TROPONINIHS 28* 48*  --   --   --      Estimated Creatinine Clearance: 63.2 mL/min (A) (by C-G formula based on SCr of 1.43 mg/dL (H)).   Medical History: Past Medical History:  Diagnosis Date   CHF (congestive heart failure) (HCC)    CKD (chronic kidney disease) stage 3, GFR 30-59 ml/min (HCC)    Edema    Hyperlipemia    Hypertension    Seizures (HCC)     Medications:  Medications Prior to Admission  Medication Sig Dispense Refill Last Dose   acetaminophen (TYLENOL) 325 MG tablet Take 650 mg by mouth every 6 (six) hours as needed for mild pain.   unk   aspirin EC 81 MG tablet Take 81 mg by mouth daily.   8.18.2023   budesonide-formoterol (SYMBICORT) 160-4.5 MCG/ACT inhaler Inhale 2 puffs into the lungs in the morning and at bedtime.   8.18.2023   carvedilol (COREG) 12.5 MG tablet Take 12.5 mg by mouth 2 (two) times daily with a meal.   8.18.2023 at 0800   donepezil (ARICEPT) 5 MG tablet Take 5 mg by mouth at bedtime.   8.17.2023   levETIRAcetam (KEPPRA) 1000 MG tablet Take 1,000 mg by mouth 2 (two) times daily.   8.18.2023   losartan (COZAAR) 50 MG tablet Take 50 mg by mouth daily.   8.18.2023   NIFEdipine (PROCARDIA XL/NIFEDICAL XL) 60 MG 24 hr tablet Take 60 mg by mouth daily.   8.18.2023   rosuvastatin (CRESTOR) 20 MG tablet Take 20  mg by mouth at bedtime.    8.17.2023   Skin Protectants, Misc. (EUCERIN) cream Apply 1 application  topically daily. Apply to legs and feet once daily   8.18.2023   torsemide (DEMADEX) 20 MG tablet Take 20 mg by mouth daily.   8.18.2023   Vitamin D, Ergocalciferol, (DRISDOL) 1.25 MG (50000 UNIT) CAPS capsule Take 50,000 Units by mouth every 7 (seven) days. Thursday's   8.17.2023   warfarin (COUMADIN) 7.5 MG tablet Take 7.5 mg by mouth daily.   8.17.2023 at 1700   Scheduled:   acetylcysteine  4 mL Nebulization BID   albuterol  2.5 mg Nebulization BID   arformoterol  15 mcg Nebulization BID   aspirin  81 mg Per Tube Daily   atorvastatin  80 mg Per Tube Daily   budesonide (PULMICORT) nebulizer solution  0.5 mg Nebulization BID   Chlorhexidine Gluconate Cloth  6 each Topical Q0600   divalproex  250 mg Oral Q12H   docusate  100 mg Per Tube BID   feeding supplement (PROSource TF20)  60 mL Per Tube Daily   feeding supplement (VITAL  HIGH PROTEIN)  1,000 mL Per Tube Q24H   levETIRAcetam  1,500 mg Per Tube Q12H   mouth rinse  15 mL Mouth Rinse Q2H   pantoprazole  40 mg Per Tube Daily   polyethylene glycol  17 g Per Tube Daily   potassium chloride  20 mEq Per Tube Once   warfarin  7.5 mg Per Tube ONCE-1600   Warfarin - Pharmacist Dosing Inpatient   Does not apply q1600    Assessment: 66 YO male transferred from [facility] 8/19 requiring intubation and continuous EEG monitoring following status epilepticus. The patient has a history of chronic afib. Based on facility [facility], patient has been on warfarin 7.5mg  once daily.  INR is currently therapeutic today.   Goal of Therapy:  INR 2-3 Monitor platelets by anticoagulation protocol: Yes   Plan:  Give warfarin 7.5mg  tonight.  D/C subcutaneous heparin as INR is therapeutic at 2.1.  F/u INR with AM labs  Monitor CBC, signs/symptoms of bleeding, INR daily   Estill Batten, PharmD

## 2021-08-24 DIAGNOSIS — G40901 Epilepsy, unspecified, not intractable, with status epilepticus: Secondary | ICD-10-CM | POA: Diagnosis not present

## 2021-08-24 DIAGNOSIS — J9600 Acute respiratory failure, unspecified whether with hypoxia or hypercapnia: Secondary | ICD-10-CM | POA: Diagnosis not present

## 2021-08-24 LAB — GLUCOSE, CAPILLARY
Glucose-Capillary: 117 mg/dL — ABNORMAL HIGH (ref 70–99)
Glucose-Capillary: 86 mg/dL (ref 70–99)
Glucose-Capillary: 93 mg/dL (ref 70–99)

## 2021-08-24 LAB — BASIC METABOLIC PANEL
Anion gap: 5 (ref 5–15)
BUN: 16 mg/dL (ref 8–23)
CO2: 25 mmol/L (ref 22–32)
Calcium: 8.1 mg/dL — ABNORMAL LOW (ref 8.9–10.3)
Chloride: 110 mmol/L (ref 98–111)
Creatinine, Ser: 1.4 mg/dL — ABNORMAL HIGH (ref 0.61–1.24)
GFR, Estimated: 56 mL/min — ABNORMAL LOW (ref >60)
Glucose, Bld: 98 mg/dL (ref 70–99)
Potassium: 3.5 mmol/L (ref 3.5–5.1)
Sodium: 140 mmol/L (ref 135–145)

## 2021-08-24 LAB — CBC WITH DIFFERENTIAL/PLATELET
Abs Immature Granulocytes: 0.01 10*3/uL (ref 0.00–0.07)
Basophils Absolute: 0.1 10*3/uL (ref 0.0–0.1)
Basophils Relative: 1 %
Eosinophils Absolute: 0.1 10*3/uL (ref 0.0–0.5)
Eosinophils Relative: 1 %
HCT: 33.7 % — ABNORMAL LOW (ref 39.0–52.0)
Hemoglobin: 10.7 g/dL — ABNORMAL LOW (ref 13.0–17.0)
Immature Granulocytes: 0 %
Lymphocytes Relative: 14 %
Lymphs Abs: 0.8 10*3/uL (ref 0.7–4.0)
MCH: 25.6 pg — ABNORMAL LOW (ref 26.0–34.0)
MCHC: 31.8 g/dL (ref 30.0–36.0)
MCV: 80.6 fL (ref 80.0–100.0)
Monocytes Absolute: 0.8 10*3/uL (ref 0.1–1.0)
Monocytes Relative: 13 %
Neutro Abs: 4.3 10*3/uL (ref 1.7–7.7)
Neutrophils Relative %: 71 %
Platelets: 184 10*3/uL (ref 150–400)
RBC: 4.18 MIL/uL — ABNORMAL LOW (ref 4.22–5.81)
RDW: 15.4 % (ref 11.5–15.5)
WBC: 6.1 10*3/uL (ref 4.0–10.5)
nRBC: 0 % (ref 0.0–0.2)

## 2021-08-24 LAB — PROTIME-INR
INR: 2 — ABNORMAL HIGH (ref 0.8–1.2)
Prothrombin Time: 22.4 seconds — ABNORMAL HIGH (ref 11.4–15.2)

## 2021-08-24 LAB — MAGNESIUM: Magnesium: 2 mg/dL (ref 1.7–2.4)

## 2021-08-24 LAB — PHOSPHORUS: Phosphorus: 4.8 mg/dL — ABNORMAL HIGH (ref 2.5–4.6)

## 2021-08-24 LAB — VALPROIC ACID LEVEL: Valproic Acid Lvl: 43 ug/mL — ABNORMAL LOW (ref 50.0–100.0)

## 2021-08-24 MED ORDER — LEVETIRACETAM 750 MG PO TABS
1500.0000 mg | ORAL_TABLET | Freq: Two times a day (BID) | ORAL | Status: DC
  Administered 2021-08-24 – 2021-08-28 (×8): 1500 mg via ORAL
  Filled 2021-08-24 (×8): qty 2

## 2021-08-24 MED ORDER — WARFARIN SODIUM 7.5 MG PO TABS
7.5000 mg | ORAL_TABLET | Freq: Once | ORAL | Status: AC
  Administered 2021-08-24: 7.5 mg via ORAL
  Filled 2021-08-24: qty 1

## 2021-08-24 MED ORDER — DOCUSATE SODIUM 50 MG/5ML PO LIQD: 100.0000 mg | Freq: Two times a day (BID) | ORAL | Status: DC | PRN

## 2021-08-24 MED ORDER — PANTOPRAZOLE SODIUM 40 MG PO TBEC
40.0000 mg | DELAYED_RELEASE_TABLET | Freq: Every day | ORAL | Status: DC
  Administered 2021-08-24 – 2021-08-28 (×5): 40 mg via ORAL
  Filled 2021-08-24 (×4): qty 1

## 2021-08-24 MED ORDER — ACETAMINOPHEN 325 MG PO TABS: 650.0000 mg | ORAL_TABLET | ORAL | Status: DC | PRN

## 2021-08-24 MED ORDER — ATORVASTATIN CALCIUM 80 MG PO TABS
80.0000 mg | ORAL_TABLET | Freq: Every day | ORAL | Status: DC
Start: 2021-08-24 — End: 2021-08-28
  Administered 2021-08-24 – 2021-08-28 (×5): 80 mg via ORAL
  Filled 2021-08-24 (×4): qty 1
  Filled 2021-08-24: qty 2

## 2021-08-24 MED ORDER — POLYETHYLENE GLYCOL 3350 17 G PO PACK: 17.0000 g | PACK | Freq: Every day | ORAL | Status: DC | PRN

## 2021-08-24 MED ORDER — POTASSIUM CHLORIDE CRYS ER 20 MEQ PO TBCR
40.0000 meq | EXTENDED_RELEASE_TABLET | Freq: Once | ORAL | Status: AC
Start: 2021-08-24 — End: 2021-08-24
  Administered 2021-08-24: 40 meq via ORAL
  Filled 2021-08-24: qty 2

## 2021-08-24 MED ORDER — ASPIRIN 81 MG PO CHEW
81.0000 mg | CHEWABLE_TABLET | Freq: Every day | ORAL | Status: DC
  Administered 2021-08-24 – 2021-08-28 (×5): 81 mg via ORAL
  Filled 2021-08-24 (×5): qty 1

## 2021-08-24 NOTE — Progress Notes (Signed)
NAME:  Nathan Shepherd, MRN:  [id number], DOB:  February 08, 1955, LOS: 3 ADMISSION DATE:  08/21/2021, CONSULTATION DATE: 8/19 REFERRING MD: Dr. Mortimer Fries, CHIEF COMPLAINT: Seizure  History of Present Illness:  66 year old male who presented to [facility] on 8/19 with reports of status epilepticus.  The patient is a group home resident and was found unresponsive.  EMS was activated and found him to be hypoxic and actively seizing with a rightward gaze.  He was given a total of 7.5 mg Versed in route but continued to seize.  He has had no purposeful movement with EMS observation.  He was intubated emergently in the ER and placed on mechanical ventilation.  Initial chest x-ray concerning for mucous plugging with right-sided opacity.  He was emergently bronched at bedside in the emergency room with extensive mucus plugging removed from the right lung. The patient was loaded with keppra in the ER.  He was treated as a severe COPE exacerbation with IV steroids, and nebulized bronchodilators.  In addition to seizure and resp failure,  he had an AKI with sr Cr of 1.52.  The patient was seen by Neurology and recommended transfer to [facility] for continuous EEG monitoring.    PCCM consulted for admission.   Pertinent  Medical History  Seizure disorder HTN HLD CKD 3 CHF  Significant Hospital Events: Including procedures, antibiotic start and stop dates in addition to other pertinent events   8/19 Presented to [facility] in status, R sided mucus plugging. Intubated.  Tx to [facility]  Interim History / Subjective:  Patient was successfully extubated yesterday Remained afebrile Passed speech and swallow evaluation  Objective   Blood pressure (!) 109/96, pulse 65, temperature 97.8 F (36.6 C), temperature source Axillary, resp. rate (!) 0, height 6\' 3"  (1.905 m), SpO2 95 %.    Vent Mode: PSV;CPAP FiO2 (%):  [40 %] 40 % PEEP:  [5 cmH20] 5 cmH20 Pressure Support:  [8 cmH20-10 cmH20] 8 cmH20   Intake/Output Summary (Last 24  hours) at 08/24/2021 0834 Last data filed at 08/24/2021 0800 Gross per 24 hour  Intake 947.25 ml  Output 1595 ml  Net -647.75 ml   There were no vitals filed for this visit.  Examination: General: Acute on chronically ill appearing adult male lying in bed HENT: MM pink/moist, ETT, pupils pinpoint  Lungs: non-labored on vent, clear on left, bronchial breath sounds on right  Cardiovascular: Irregularly irregular, no m/r/g Abdomen: soft, non-tender, bsx4 active  Extremities: warm/dry, no significant edema  Neuro: Awake, following commands, moving all 4 extremities  Resolved Hospital Problem list     Assessment & Plan:  Status Epilepticus  Prior history of seizures, L Occipital CVA, Multi-infarct Dementia, Prior Polysubstance Abuse  Acute Encephalopathy secondary to Seizure, resolved Patient presented with breakthrough seizures At home he is on Keppra 1500 mg twice daily, stated compliance with meds EEG showing no active seizures but epileptogenicity and high risk of seizures Continue Keppra 1500 mg twice daily and valproic acid 5 mg twice daily Resume donepezil  Acute Hypoxic Respiratory Failure  Acute Mucus Plugging, Suspected Aspiration pneumonia Pulmonary Nodules Enlarged Pulmonary Arteries Never smoker. CT at [facility] with pneumomediastinum, pulmonary nodules, diffuse R GGO Patient was successfully extubated yesterday On room air currently Post bronchoscopy x-ray looks better but still has multifocal infiltrates on the right side Continue IV antibiotics with Zosyn to complete 5 days therapy Respiratory culture is growing oral flora Continue mucomyst + albuterol BID x 2 days  Continue chest PT -brovna + pulmicort in  lieu of symbicort   Chronic AF on Warfarin Hx HTN, HLD Heart rate remain well controlled Continue ASA, lipitor INR is therapeutic Pharmacy is managing Coumadin dosing Resume home nifedipine and Coreg, hold cozaar and demadex   AKI on CKD  IIIa Hypokalemia/hypophosphatemia/hypomagnesemia Serum creatinine continue to trend down Aggressively supplement electrolytes and monitor Avoid nephrotoxic agents, ensure adequate renal perfusion  Anemia of chronic disease Monitor H&H and transfuse as needed  Transfer out of ICU  Best Practice (right click and "Reselect all SmartList Selections" daily)  Diet/type: Regular diet DVT prophylaxis: Coumadin GI prophylaxis: PPI Lines: N/A Foley:  N/A Code Status:  full code Last date of multidisciplinary goals of care discussion: Unable to reach family   Labs   CBC: Recent Labs  Lab 08/21/21 0812 08/21/21 1821 08/22/21 0347 08/24/21 0240  WBC 5.2  --  14.3* 6.1  NEUTROABS 4.2  --   --  4.3  HGB 11.4* 12.9* 12.7* 10.7*  HCT 36.7* 38.0* 38.9* 33.7*  MCV 81.6  --  80.5 80.6  PLT 222  --  206 184    Basic Metabolic Panel: Recent Labs  Lab 08/21/21 0812 08/21/21 1821 08/21/21 1852 08/22/21 0347 08/22/21 1935 08/23/21 0656 08/24/21 0240  NA 140 140  --  137  --   --  140  K 3.4* 3.0*  --  3.3*  --   --  3.5  CL 106  --   --  105  --   --  110  CO2 27  --   --  19*  --   --  25  GLUCOSE 107*  --   --  134*  --   --  98  BUN 14  --   --  11  --   --  16  CREATININE 1.52*  --   --  1.43*  --   --  1.40*  CALCIUM 9.1  --   --  9.1  --   --  8.1*  MG 1.9  --  1.7 1.7 2.4 2.0 2.0  PHOS  --   --  1.4* <1.0* 2.6 1.9* 4.8*   GFR: Estimated Creatinine Clearance: 64.5 mL/min (A) (by C-G formula based on SCr of 1.4 mg/dL (H)). Recent Labs  Lab 08/21/21 0812 08/21/21 1019 08/22/21 0347 08/24/21 0240  WBC 5.2  --  14.3* 6.1  LATICACIDVEN 3.7* 3.2*  --   --     Liver Function Tests: Recent Labs  Lab 08/21/21 0812  AST 22  ALT 14  ALKPHOS 77  BILITOT 0.5  PROT 7.1  ALBUMIN 3.8   No results for input(s): "LIPASE", "AMYLASE" in the last 168 hours. No results for input(s): "AMMONIA" in the last 168 hours.  ABG    Component Value Date/Time   PHART 7.495 (H)  08/21/2021 1821   PCO2ART 27.8 (L) 08/21/2021 1821   PO2ART 126 (H) 08/21/2021 1821   HCO3 21.4 08/21/2021 1821   TCO2 22 08/21/2021 1821   ACIDBASEDEF 1.0 08/21/2021 1821   O2SAT 99 08/21/2021 1821     Coagulation Profile: Recent Labs  Lab 08/21/21 0812 08/22/21 0347 08/23/21 0656 08/24/21 0240  INR 1.7* 1.7* 2.1* 2.0*    Cardiac Enzymes: No results for input(s): "CKTOTAL", "CKMB", "CKMBINDEX", "TROPONINI" in the last 168 hours.  HbA1C: Hgb A1c MFr Bld  Date/Time Value Ref Range Status  08/18/2016 02:35 AM 6.4 (H) 4.8 - 5.6 % Final    Comment:    (NOTE) Pre diabetes:  5.7%-6.4% Diabetes:              >6.4% Glycemic control for   <7.0% adults with diabetes     CBG: Recent Labs  Lab 08/23/21 1557 08/23/21 2008 08/24/21 0013 08/24/21 0348 08/24/21 0809  GLUCAP 95 111* 117* 93 86     Cheri Fowler, MD [facility] See Amion for pager If no response to pager, please call [phone] until 7pm After 7pm, Please call E-link [phone]

## 2021-08-24 NOTE — Progress Notes (Addendum)
NEUROLOGY CONSULTATION NOTE   Date of service: August 24, 2021 Patient Name: Nathan Shepherd MRN:  [id number] DOB:  [birth year]   S: Seen and examined. Extubated overnight.  ROS: negative  MEDICATIONS Current Outpatient Medications  Medication Instructions   acetaminophen (TYLENOL) 650 mg, Oral, Every 6 hours PRN   aspirin EC 81 mg, Oral, Daily   budesonide-formoterol (SYMBICORT) 160-4.5 MCG/ACT inhaler 2 puffs, Inhalation, 2 times daily   carvedilol (COREG) 12.5 mg, Oral, 2 times daily with meals   donepezil (ARICEPT) 5 mg, Oral, Daily at bedtime   levETIRAcetam (KEPPRA) 1,000 mg, Oral, 2 times daily   losartan (COZAAR) 50 mg, Oral, Daily   NIFEdipine (PROCARDIA XL/NIFEDICAL XL) 60 mg, Oral, Daily   rosuvastatin (CRESTOR) 20 mg, Oral, Daily at bedtime   Skin Protectants, Misc. (EUCERIN) cream 1 application , Topical, Daily, Apply to legs and feet once daily    torsemide (DEMADEX) 20 mg, Oral, Daily   Vitamin D (Ergocalciferol) (DRISDOL) 50,000 Units, Oral, Every 7 days, Thursday's   warfarin (COUMADIN) 7.5 mg, Oral, Daily    Current Facility-Administered Medications:    acetaminophen (TYLENOL) tablet 650 mg, 650 mg, Oral, Q4H PRN, Jacky Kindle, MD   acetylcysteine (MUCOMYST) 20 % nebulizer / oral solution 4 mL, 4 mL, Nebulization, BID, Ollis, Brandi L, NP, 4 mL at 08/24/21 0903   albuterol (PROVENTIL) (2.5 MG/3ML) 0.083% nebulizer solution 2.5 mg, 2.5 mg, Nebulization, BID, Ollis, Brandi L, NP, 2.5 mg at 08/24/21 0903   arformoterol (BROVANA) nebulizer solution 15 mcg, 15 mcg, Nebulization, BID, Ollis, Brandi L, NP, 15 mcg at 08/24/21 [id number]   aspirin chewable tablet 81 mg, 81 mg, Oral, Daily, Chand, Sudham, MD   atorvastatin (LIPITOR) tablet 80 mg, 80 mg, Oral, Daily, Chand, Sudham, MD   budesonide (PULMICORT) nebulizer solution 0.5 mg, 0.5 mg, Nebulization, BID, Ollis, Brandi L, NP, 0.5 mg at 08/24/21 [id number]   Chlorhexidine Gluconate Cloth 2 % PADS 6 each, 6 each, Topical,  Q0600, Jacky Kindle, MD, 6 each at 08/22/21 2244   [DISCONTINUED] valproate (DEPACON) 500 mg in dextrose 5 % 50 mL IVPB, 500 mg, Intravenous, Q12H, Stopped at 08/24/21 0016 **OR** divalproex (DEPAKOTE) DR tablet 500 mg, 500 mg, Oral, Q12H, Chand, Sudham, MD   docusate (COLACE) 50 MG/5ML liquid 100 mg, 100 mg, Oral, BID PRN, Jacky Kindle, MD   hydrALAZINE (APRESOLINE) injection 10 mg, 10 mg, Intravenous, Q4H PRN, Mauri Brooklyn, MD, 10 mg at 08/22/21 1836   [DISCONTINUED] levETIRAcetam (KEPPRA) IVPB 1500 mg/ 100 mL premix, 1,500 mg, Intravenous, Q12H, Stopped at 08/24/21 0759 **OR** levETIRAcetam (KEPPRA) tablet 1,500 mg, 1,500 mg, Oral, Q12H, Chand, Sudham, MD   ondansetron (ZOFRAN) injection 4 mg, 4 mg, Intravenous, Q6H PRN, Ollis, Brandi L, NP   pantoprazole (PROTONIX) 2 mg/mL oral suspension 40 mg, 40 mg, Per Tube, Daily, Ollis, Brandi L, NP, 40 mg at 08/23/21 1025   piperacillin-tazobactam (ZOSYN) IVPB 3.375 g, 3.375 g, Intravenous, Q8H, Ventura Sellers, RPH, Last Rate: 12.5 mL/hr at 08/24/21 0900, Infusion Verify at 08/24/21 0900   polyethylene glycol (MIRALAX / GLYCOLAX) packet 17 g, 17 g, Oral, Daily PRN, Jacky Kindle, MD   potassium chloride SA (KLOR-CON M) CR tablet 40 mEq, 40 mEq, Oral, Once, Chand, Sudham, MD   warfarin (COUMADIN) tablet 7.5 mg, 7.5 mg, Oral, ONCE-1600, Ventura Sellers, [facility]   Warfarin - Pharmacist Dosing Inpatient, , Does not apply, q1600, Billey Gosling, Kim  Vitals:   08/24/21 0600 08/24/21 0700 08/24/21 0800 08/24/21 [id number]  BP: 120/78 136/76 (!) 109/96   Pulse: 67 61 65 73  Resp: 18 16 (!) 0 (!) 22  Temp:   97.8 F (36.6 C)   TempSrc:   Axillary   SpO2: 100% 100% 95%   Height:         Body mass index is 24.09 kg/m. Physical Exam General: Extubated, sitting comfortably in bed. HNT: Normocephalic atraumatic CVS: Irregularly irregular Respiratory: Breathing well saturating normally on room air Abdomen nondistended  nontender Neurological Awake alert oriented x3 Decreased attention concentration Mild dysarthria No aphasia Cranial nerves II to XII intact Motor examination with no drift in any fours Sensation intact to light touch Coordination with no dysmetria  Labs CBC:  Recent Labs  Lab 08/21/21 0812 08/21/21 1821 08/22/21 0347 08/24/21 0240  WBC 5.2  --  14.3* 6.1  NEUTROABS 4.2  --   --  4.3  HGB 11.4*   < > 12.7* 10.7*  HCT 36.7*   < > 38.9* 33.7*  MCV 81.6  --  80.5 80.6  PLT 222  --  206 184   < > = values in this interval not displayed.     Basic Metabolic Panel:  Lab Results  Component Value Date   NA 140 08/24/2021   K 3.5 08/24/2021   CO2 25 08/24/2021   GLUCOSE 98 08/24/2021   BUN 16 08/24/2021   CREATININE 1.40 (H) 08/24/2021   CALCIUM 8.1 (L) 08/24/2021   GFRNONAA 56 (L) 08/24/2021   GFRAA 59 (L) 12/15/2016   Lipid Panel:  Lab Results  Component Value Date   LDLCALC 37 08/18/2016   HgbA1c:  Lab Results  Component Value Date   HGBA1C 6.4 (H) 08/18/2016   Urine Drug Screen:     Component Value Date/Time   LABOPIA NONE DETECTED 08/16/2016 2254   COCAINSCRNUR NONE DETECTED 08/16/2016 2254   LABBENZ NONE DETECTED 08/16/2016 2254   AMPHETMU NONE DETECTED 08/16/2016 2254   THCU NONE DETECTED 08/16/2016 2254   LABBARB NONE DETECTED 08/16/2016 2254    Alcohol Level     Component Value Date/Time   ETH <10 08/21/2021 0812  VPA level 43  CT Head without contrast(Personally reviewed): CTH was negative for a large hypodensity concerning for a large territory infarct or hyperdensity concerning for an ICH. Old L PCA stroke.   MRI Brain: 1. 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 2. No acute intracranial process. No evidence of acute or subacute infarct.  Last cEEG  from 08/22/2021 1957 to  08/23/2021 1032 This study showed evidence of epileptogenicity arising from left temporal region. There is also cortical dysfunction in left hemisphere likely secondary to underlying structural abnormality, post-ictal state. Additionally there is moderate diffuse encephalopathy, nonspecific etiology. No seizures were seen throughout the recording.  rEEG: This study showed evidence of epileptogenicity arising from left temporal region. There is also cortical dysfunction in left hemisphere likely secondary to underlying structural abnormality, post-ictal state. Additionally there is severe diffuse encephalopathy, nonspecific etiology. No seizures were seen throughout the recording.  Impression   Immanuel Fedak is a 66 y.o. male with PMH significant for CHF, CKD3, HTN, HLD, seizures on Keppra 1500 BID in the setting of L occipital stroke, afib on warfarin with INR of 1.7 who was found unresponsive and presented to [facility] in status epilepticus. Had a ?seizure in the  Neuro ICU before being hooked up to cEEG. No seizures on cEEG.  Extubated yesterday.  Today on examination, other than mild encephalopathy, no frank focal abnormality seen.  Valproate added during this admission to Keppra.  Impression Breakthrough seizure and status epilepticus-resolved Acute hypoxic respiratory failure due to the above  Recommendations  - continue Keppra 1500mg  Q12hours. - continue VPA 500mg  BID-VPA level today is 43 but its not steady state yet.  I would leave him on 500 twice daily of valproate for now. - Check VPA level 3 to 5 days-can be done in primary care. -Continue anticoagulation.  Valproate should not have much interaction with Coumadin, but valproate is an enzyme inhibitor and will need close monitoring of INR. - Seizure precautions -Follow-up outpatient neurology in 2 to 4 weeks after discharge. Discussed with PCCM team and Dr. Inpatient neurology will be available as needed.  -- , MD Neurologist [facility] Pager: [phone]

## 2021-08-24 NOTE — Progress Notes (Signed)
ANTICOAGULATION CONSULT NOTE - Follow up   Pharmacy Consult for warfarin Indication: atrial fibrillation  Allergies  Allergen Reactions   Penicillins Hives    Patient Measurements: Height: 6\' 3"  (190.5 cm) IBW/kg (Calculated) : 84.5  Vital Signs: Temp: 97.8 F (36.6 C) (08/22 0800) Temp Source: Axillary (08/22 0800) BP: 109/96 (08/22 0800) Pulse Rate: 65 (08/22 0800)  Labs: Recent Labs    08/21/21 1019 08/21/21 1821 08/21/21 1821 08/22/21 0347 08/23/21 0656 08/24/21 0240  HGB  --  12.9*   < > 12.7*  --  10.7*  HCT  --  38.0*  --  38.9*  --  33.7*  PLT  --   --   --  206  --  184  LABPROT  --   --   --  20.2* 22.9* 22.4*  INR  --   --   --  1.7* 2.1* 2.0*  CREATININE  --   --   --  1.43*  --  1.40*  TROPONINIHS 48*  --   --   --   --   --    < > = values in this interval not displayed.     Estimated Creatinine Clearance: 64.5 mL/min (A) (by C-G formula based on SCr of 1.4 mg/dL (H)).   Medical History: Past Medical History:  Diagnosis Date   CHF (congestive heart failure) (HCC)    CKD (chronic kidney disease) stage 3, GFR 30-59 ml/min (HCC)    Edema    Hyperlipemia    Hypertension    Seizures (HCC)     Medications:  Medications Prior to Admission  Medication Sig Dispense Refill Last Dose   acetaminophen (TYLENOL) 325 MG tablet Take 650 mg by mouth every 6 (six) hours as needed for mild pain.   unk   aspirin EC 81 MG tablet Take 81 mg by mouth daily.   8.18.2023   budesonide-formoterol (SYMBICORT) 160-4.5 MCG/ACT inhaler Inhale 2 puffs into the lungs in the morning and at bedtime.   8.18.2023   carvedilol (COREG) 12.5 MG tablet Take 12.5 mg by mouth 2 (two) times daily with a meal.   8.18.2023 at 0800   donepezil (ARICEPT) 5 MG tablet Take 5 mg by mouth at bedtime.   8.17.2023   levETIRAcetam (KEPPRA) 1000 MG tablet Take 1,000 mg by mouth 2 (two) times daily.   8.18.2023   losartan (COZAAR) 50 MG tablet Take 50 mg by mouth daily.   8.18.2023   NIFEdipine  (PROCARDIA XL/NIFEDICAL XL) 60 MG 24 hr tablet Take 60 mg by mouth daily.   8.18.2023   rosuvastatin (CRESTOR) 20 MG tablet Take 20 mg by mouth at bedtime.    8.17.2023   Skin Protectants, Misc. (EUCERIN) cream Apply 1 application  topically daily. Apply to legs and feet once daily   8.18.2023   torsemide (DEMADEX) 20 MG tablet Take 20 mg by mouth daily.   8.18.2023   Vitamin D, Ergocalciferol, (DRISDOL) 1.25 MG (50000 UNIT) CAPS capsule Take 50,000 Units by mouth every 7 (seven) days. Thursday's   8.17.2023   warfarin (COUMADIN) 7.5 MG tablet Take 7.5 mg by mouth daily.   8.17.2023 at 1700   Scheduled:   acetylcysteine  4 mL Nebulization BID   albuterol  2.5 mg Nebulization BID   arformoterol  15 mcg Nebulization BID   aspirin  81 mg Per Tube Daily   atorvastatin  80 mg Per Tube Daily   budesonide (PULMICORT) nebulizer solution  0.5 mg Nebulization BID  Chlorhexidine Gluconate Cloth  6 each Topical Q0600   divalproex  500 mg Oral Q12H   levETIRAcetam  1,500 mg Per Tube Q12H   pantoprazole  40 mg Per Tube Daily   potassium chloride  20 mEq Per Tube Once   Warfarin - Pharmacist Dosing Inpatient   Does not apply q1600    Assessment: 66 YO male transferred from [place] 8/19 requiring intubation and continuous EEG monitoring following status epilepticus. The patient has a history of chronic afib. Based on facility [facility], patient has been on warfarin 7.5mg  once daily.  INR is currently therapeutic today.   Goal of Therapy:  INR 2-3 Monitor platelets by anticoagulation protocol: Yes   Plan:  Give warfarin 7.5mg  tonight per home dosing regimen.  INR today is therapeutic at 2.0.  F/u INR with AM labs  Monitor CBC, signs/symptoms of bleeding, INR daily   Estill Batten, PharmD

## 2021-08-24 NOTE — Evaluation (Signed)
Physical Therapy Evaluation Patient Details Name: Nathan Shepherd MRN: [id number] DOB: 05/23/1955 Today's Date: 08/24/2021  History of Present Illness  Patient is a 66 y/o male who was found unresponsive and seizing at his group home on 08/21/21, intubated 8/19-8/21, emergent bronchoscopy for poor respiration to remove mucus plug and thick secretions. Admitted with Status epilepticus and acute encephalopathy in the setting of seizures. Bed bugs present. PMH includes seizures, HTN, CHF, CKD.  Clinical Impression  Patient presents with generalized weakness, impaired balance, impaired cognition and impaired mobility s/p above. Pt is from a group home and reports being independent for ADLs and ambulation PTA. Reports he can get assist for ADLs if needed when he goes home. Today pt requires Min A for steadying assist and Mod A for gait training with IV pole and HHA for support. Attempted using RW however it was unsuccessful and made balance/safety worse. Noted to have varying gait speed due to anterior lean and forward momentum. If group home staff can provide initial hands on assist, pt safe to d/c home with HHPT. Will follow acutely to maximize independence and mobility prior to return home.     Recommendations for follow up therapy are one component of a multi-disciplinary discharge planning process, led by the attending physician.  Recommendations may be updated based on patient status, additional functional criteria and insurance authorization.  Follow Up Recommendations Home health PT (pending progress)      Assistance Recommended at Discharge Frequent or constant Supervision/Assistance  Patient can return home with the following  A little help with walking and/or transfers;A little help with bathing/dressing/bathroom;Assistance with cooking/housework;Help with stairs or ramp for entrance;Direct supervision/assist for financial management;Direct supervision/assist for medications  management;Assist for transportation    Equipment Recommendations None recommended by PT  Recommendations for Other Services       Functional Status Assessment Patient has had a recent decline in their functional status and demonstrates the ability to make significant improvements in function in a reasonable and predictable amount of time.     Precautions / Restrictions Precautions Precautions: Fall Restrictions Weight Bearing Restrictions: No      Mobility  Bed Mobility Overal bed mobility: Needs Assistance Bed Mobility: Supine to Sit, Sit to Supine     Supine to sit: Supervision, HOB elevated Sit to supine: Supervision, HOB elevated   General bed mobility comments: Use of rail, no assist needed.    Transfers Overall transfer level: Needs assistance Equipment used: Rolling walker (2 wheels) Transfers: Sit to/from Stand Sit to Stand: Min assist           General transfer comment: Min A to steady in standing. Stood from [employer]. Deferred tx to chair as pt transferring off the unit    Ambulation/Gait Ambulation/Gait assistance: Mod assist Gait Distance (Feet): 60 Feet Assistive device: 1 person hand held assist, IV Pole Gait Pattern/deviations: Step-through pattern, Decreased step length - right, Decreased step length - left, Wide base of support, Trunk flexed Gait velocity: varies, forward lean/momentum at times needing cues to keep IV pole close to his body     General Gait Details: Slow, unsteady gait with wide BoS, shortened step lengths and forward flexed posture, using IV pole for 1 UE and HHA for other UE. Attempted using RW however unsuccessful, resulted in increased anterior lean and safety concerns.  Stairs            Wheelchair Mobility    Modified Rankin (Stroke Patients Only)       Balance  Overall balance assessment: Needs assistance Sitting-balance support: Feet supported, No upper extremity supported Sitting balance-Leahy Scale: Fair      Standing balance support: During functional activity, Single extremity supported, Bilateral upper extremity supported Standing balance-Leahy Scale: Poor Standing balance comment: Requires BUE support for dynamic tasks                             Pertinent Vitals/Pain Pain Assessment Pain Assessment: No/denies pain    Home Living Family/patient expects to be discharged to:: Group home                        Prior Function Prior Level of Function : Independent/Modified Independent             Mobility Comments: Independent ADLs Comments: Independent, reports he can get help with bathing if need be and for IADls.     Hand Dominance   Dominant Hand: Right    Extremity/Trunk Assessment   Upper Extremity Assessment Upper Extremity Assessment: Defer to OT evaluation    Lower Extremity Assessment Lower Extremity Assessment: Generalized weakness       Communication   Communication: No difficulties  Cognition Arousal/Alertness: Awake/alert Behavior During Therapy: WFL for tasks assessed/performed Overall Cognitive Status: History of cognitive impairments - at baseline                                 General Comments: Follows commands well with repetition. Decreased awareness of safety.        General Comments General comments (skin integrity, edema, etc.): VSS on RA.    Exercises     Assessment/Plan    PT Assessment Patient needs continued PT services  PT Problem List Decreased strength;Decreased mobility;Decreased safety awareness;Decreased balance;Decreased knowledge of use of DME;Decreased cognition       PT Treatment Interventions Therapeutic activities;Gait training;Therapeutic exercise;Patient/family education;Balance training;Functional mobility training;Neuromuscular re-education;DME instruction    PT Goals (Current goals can be found in the Care Plan section)  Acute Rehab PT Goals Patient Stated Goal: to get  stronger and go home PT Goal Formulation: With patient Time For Goal Achievement: 09/07/21 Potential to Achieve Goals: Good    Frequency Min 3X/week     Co-evaluation               AM-PAC PT "6 Clicks" Mobility  Outcome Measure Help needed turning from your back to your side while in a flat bed without using bedrails?: A Little Help needed moving from lying on your back to sitting on the side of a flat bed without using bedrails?: A Little Help needed moving to and from a bed to a chair (including a wheelchair)?: A Little Help needed standing up from a chair using your arms (e.g., wheelchair or bedside chair)?: A Little Help needed to walk in hospital room?: A Lot Help needed climbing 3-5 steps with a railing? : A Lot 6 Click Score: 16    End of Session Equipment Utilized During Treatment: Gait belt Activity Tolerance: Patient tolerated treatment well Patient left: in bed;with call bell/phone within reach;with bed alarm set Nurse Communication: Mobility status PT Visit Diagnosis: Other abnormalities of gait and mobility (R26.89);Unsteadiness on feet (R26.81);Muscle weakness (generalized) (M62.81)    Time: 1245-8099 PT Time Calculation (min) (ACUTE ONLY): 21 min   Charges:   PT Evaluation $PT Eval Moderate Complexity: 1 Mod  Army Melia, DPT Acute Rehabilitation Services Secure chat preferred Office [phone]     Marcy Panning 08/24/2021, 10:44 AM

## 2021-08-25 DIAGNOSIS — G40901 Epilepsy, unspecified, not intractable, with status epilepticus: Secondary | ICD-10-CM | POA: Diagnosis not present

## 2021-08-25 DIAGNOSIS — J9600 Acute respiratory failure, unspecified whether with hypoxia or hypercapnia: Secondary | ICD-10-CM

## 2021-08-25 DIAGNOSIS — E876 Hypokalemia: Secondary | ICD-10-CM

## 2021-08-25 DIAGNOSIS — J9601 Acute respiratory failure with hypoxia: Secondary | ICD-10-CM

## 2021-08-25 DIAGNOSIS — J690 Pneumonitis due to inhalation of food and vomit: Secondary | ICD-10-CM

## 2021-08-25 DIAGNOSIS — N183 Chronic kidney disease, stage 3 unspecified: Secondary | ICD-10-CM

## 2021-08-25 DIAGNOSIS — N179 Acute kidney failure, unspecified: Secondary | ICD-10-CM

## 2021-08-25 DIAGNOSIS — I482 Chronic atrial fibrillation, unspecified: Secondary | ICD-10-CM

## 2021-08-25 LAB — CBC WITH DIFFERENTIAL/PLATELET
Abs Immature Granulocytes: 0.01 10*3/uL (ref 0.00–0.07)
Basophils Absolute: 0 10*3/uL (ref 0.0–0.1)
Basophils Relative: 1 %
Eosinophils Absolute: 0.3 10*3/uL (ref 0.0–0.5)
Eosinophils Relative: 6 %
HCT: 34.4 % — ABNORMAL LOW (ref 39.0–52.0)
Hemoglobin: 11 g/dL — ABNORMAL LOW (ref 13.0–17.0)
Immature Granulocytes: 0 %
Lymphocytes Relative: 15 %
Lymphs Abs: 0.8 10*3/uL (ref 0.7–4.0)
MCH: 25.8 pg — ABNORMAL LOW (ref 26.0–34.0)
MCHC: 32 g/dL (ref 30.0–36.0)
MCV: 80.8 fL (ref 80.0–100.0)
Monocytes Absolute: 0.5 10*3/uL (ref 0.1–1.0)
Monocytes Relative: 9 %
Neutro Abs: 4 10*3/uL (ref 1.7–7.7)
Neutrophils Relative %: 69 %
Platelets: 178 10*3/uL (ref 150–400)
RBC: 4.26 MIL/uL (ref 4.22–5.81)
RDW: 15.4 % (ref 11.5–15.5)
WBC: 5.7 10*3/uL (ref 4.0–10.5)
nRBC: 0 % (ref 0.0–0.2)

## 2021-08-25 LAB — BASIC METABOLIC PANEL
Anion gap: 4 — ABNORMAL LOW (ref 5–15)
BUN: 15 mg/dL (ref 8–23)
CO2: 27 mmol/L (ref 22–32)
Calcium: 8.7 mg/dL — ABNORMAL LOW (ref 8.9–10.3)
Chloride: 110 mmol/L (ref 98–111)
Creatinine, Ser: 1.48 mg/dL — ABNORMAL HIGH (ref 0.61–1.24)
GFR, Estimated: 53 mL/min — ABNORMAL LOW (ref >60)
Glucose, Bld: 144 mg/dL — ABNORMAL HIGH (ref 70–99)
Potassium: 3.5 mmol/L (ref 3.5–5.1)
Sodium: 141 mmol/L (ref 135–145)

## 2021-08-25 LAB — CULTURE, RESPIRATORY W GRAM STAIN

## 2021-08-25 LAB — PHOSPHORUS: Phosphorus: 2 mg/dL — ABNORMAL LOW (ref 2.5–4.6)

## 2021-08-25 LAB — MAGNESIUM: Magnesium: 2 mg/dL (ref 1.7–2.4)

## 2021-08-25 LAB — PROTIME-INR
INR: 2.1 — ABNORMAL HIGH (ref 0.8–1.2)
Prothrombin Time: 22.9 seconds — ABNORMAL HIGH (ref 11.4–15.2)

## 2021-08-25 MED ORDER — ALBUTEROL SULFATE (2.5 MG/3ML) 0.083% IN NEBU
2.5000 mg | INHALATION_SOLUTION | Freq: Four times a day (QID) | RESPIRATORY_TRACT | Status: DC | PRN
Start: 2021-08-25 — End: 2021-08-28

## 2021-08-25 MED ORDER — WARFARIN SODIUM 7.5 MG PO TABS
7.5000 mg | ORAL_TABLET | Freq: Every day | ORAL | Status: DC
  Administered 2021-08-25 – 2021-08-27 (×3): 7.5 mg via ORAL
  Filled 2021-08-25 (×3): qty 1

## 2021-08-25 MED ORDER — K PHOS MONO-SOD PHOS DI & MONO 155-852-130 MG PO TABS
500.0000 mg | ORAL_TABLET | Freq: Two times a day (BID) | ORAL | Status: AC
  Administered 2021-08-25 (×2): 500 mg via ORAL
  Filled 2021-08-25 (×2): qty 2

## 2021-08-25 NOTE — Evaluation (Signed)
Occupational Therapy Evaluation Patient Details Name: Nathan Shepherd MRN: [id number] DOB: Jul 23, 1955 Today's Date: 08/25/2021   History of Present Illness Patient is a 66 y/o male who was found unresponsive and seizing at his group home on 08/21/21, intubated 8/19-8/21, emergent bronchoscopy for poor respiration to remove mucus plug and thick secretions. Admitted with Status epilepticus and acute encephalopathy in the setting of seizures. Bed bugs present. PMH includes seizures, HTN, CHF, CKD.   Clinical Impression   PTA, pt lived in a group home where he received assistance with IADLs including laundry, transportation, cooking, cleaning, and medication management. Upon eval, pt presents with decreased strength, balance, and coordination. Pt following commands throughout session, and with decreased ability to perform short term memory recall, but believe he is near baseline. Pt requiring min guard A for functional mobility, supervision for UB ADL, and up to mod A for LB ADL. Pt requiring multimodal cues to achieve upright position following transfers. Recommend HHOT to optimize safety and independence in ADL and IADL.      Recommendations for follow up therapy are one component of a multi-disciplinary discharge planning process, led by the attending physician.  Recommendations may be updated based on patient status, additional functional criteria and insurance authorization.   Follow Up Recommendations  Home health OT    Assistance Recommended at Discharge Frequent or constant Supervision/Assistance  Patient can return home with the following A little help with walking and/or transfers;A little help with bathing/dressing/bathroom;Assistance with cooking/housework;Direct supervision/assist for medications management;Direct supervision/assist for financial management;Assist for transportation    Functional Status Assessment  Patient has had a recent decline in their functional status and  demonstrates the ability to make significant improvements in function in a reasonable and predictable amount of time.  Equipment Recommendations  Other (comment) (Determine equipment at facility. If none, recommend BSC)    Recommendations for Other Services       Precautions / Restrictions Precautions Precautions: Fall Restrictions Weight Bearing Restrictions: No      Mobility Bed Mobility Overal bed mobility: Needs Assistance Bed Mobility: Supine to Sit     Supine to sit: Supervision, HOB elevated          Transfers Overall transfer level: Needs assistance Equipment used: Rolling walker (2 wheels) Transfers: Sit to/from Stand Sit to Stand: Min guard           General transfer comment: cues for hant placement and coming forward.  No assist, but guarding is appropriate. Cues to achieve upright position and for posture.      Balance Overall balance assessment: Needs assistance Sitting-balance support: Feet supported, No upper extremity supported Sitting balance-Leahy Scale: Fair     Standing balance support: During functional activity, Bilateral upper extremity supported, Single extremity supported Standing balance-Leahy Scale: Poor Standing balance comment: Reliant on AD or external support.                           ADL either performed or assessed with clinical judgement   ADL Overall ADL's : Needs assistance/impaired Eating/Feeding: Modified independent;Sitting   Grooming: Wash/dry hands;Sitting;Modified independent   Upper Body Bathing: Set up;Supervision/ safety;Sitting   Lower Body Bathing: Min guard;Sitting/lateral leans   Upper Body Dressing : Set up;Sitting   Lower Body Dressing: Moderate assistance;Sitting/lateral leans Lower Body Dressing Details (indicate cue type and reason): Mod A to don socks. Pt requiring support to maintain figure 4 position and backward chaining to don L sock Toilet Transfer: Minimal assistance;Rolling  walker (2 wheels);Grab Paramedic Details (indicate cue type and reason): Min A for achievement of upright position. Cues to extend at knees and hips. Increased time. Toileting- Clothing Manipulation and Hygiene: Supervision/safety;Sitting/lateral lean Toileting - Clothing Manipulation Details (indicate cue type and reason): Perseverating on posterior pericare and causing minor bleeding. RN notified.     Functional mobility during ADLs: Min guard;Rolling walker (2 wheels) General ADL Comments: Pt limited by balance, strength, posture, and cognition.     Vision Baseline Vision/History: 1 Wears glasses Ability to See in Adequate Light: 1 Impaired Patient Visual Report: No change from baseline Vision Assessment?: Vision impaired- to be further tested in functional context Additional Comments: Pt reports he wears glasses at baseline, but does not have them here. Requiring assistance to locate TV controls on remote and bumping into counter/door with RW.     Perception     Praxis      Pertinent Vitals/Pain Pain Assessment Pain Assessment: Faces Faces Pain Scale: No hurt Pain Intervention(s): Monitored during session     Hand Dominance Right   Extremity/Trunk Assessment Upper Extremity Assessment Upper Extremity Assessment: Generalized weakness   Lower Extremity Assessment Lower Extremity Assessment: Generalized weakness   Cervical / Trunk Assessment Cervical / Trunk Assessment: Normal   Communication Communication Communication: No difficulties   Cognition Arousal/Alertness: Awake/alert Behavior During Therapy: WFL for tasks assessed/performed Overall Cognitive Status: History of cognitive impairments - at baseline                                 General Comments: Follows commands well with repetition. Decreased awareness of safety; mod multimodal cues for RW management in small spoaces (restroom). Pt unable to recall and words on  delayed memory recall test. Pt with skill to count backward from 20 to 1. Perseverating on posterior pericare and attempting to flush commode after each wipe.     General Comments  VSS    Exercises     Shoulder Instructions      Home Living Family/patient expects to be discharged to:: Group home                                 Additional Comments: Pt reports it is an assisted living. Reporting he performed ADL independently, but staff at the home performed cooking and cleaning and assisted with medication management.      Prior Functioning/Environment Prior Level of Function : Needs assist             Mobility Comments: Independent ADLs Comments: Independent, reports he can get help with bathing if need be and receives assist for medication management, laundry, and cooking/cleaning at baseline.        OT Problem List: Decreased strength;Impaired balance (sitting and/or standing);Decreased activity tolerance;Impaired vision/perception;Decreased coordination;Decreased cognition;Decreased safety awareness;Decreased knowledge of use of DME or AE      OT Treatment/Interventions: Self-care/ADL training;Therapeutic exercise;DME and/or AE instruction;Therapeutic activities;Cognitive remediation/compensation;Visual/perceptual remediation/compensation;Patient/family education;Balance training    OT Goals(Current goals can be found in the care plan section) Acute Rehab OT Goals Patient Stated Goal: Feel normal OT Goal Formulation: With patient Time For Goal Achievement: 09/08/21 Potential to Achieve Goals: Good  OT Frequency: Min 2X/week    Co-evaluation              AM-PAC OT "6 Clicks" Daily Activity     Outcome Measure Help  from another person eating meals?: None Help from another person taking care of personal grooming?: A Little Help from another person toileting, which includes using toliet, bedpan, or urinal?: A Little Help from another person bathing  (including washing, rinsing, drying)?: A Little Help from another person to put on and taking off regular upper body clothing?: A Little Help from another person to put on and taking off regular lower body clothing?: A Little 6 Click Score: 19   End of Session Equipment Utilized During Treatment: Gait belt;Rolling walker (2 wheels) Nurse Communication: Mobility status  Activity Tolerance: Patient tolerated treatment well Patient left: in chair;with call bell/phone within reach;with chair alarm set  OT Visit Diagnosis: Unsteadiness on feet (R26.81);Muscle weakness (generalized) (M62.81);Other abnormalities of gait and mobility (R26.89);Other symptoms and signs involving cognitive function;Low vision, both eyes (H54.2)                Time: 5093-2671 OT Time Calculation (min): 45 min Charges:  OT General Charges $OT Visit: 1 Visit OT Evaluation $OT Eval Moderate Complexity: 1 Mod OT Treatments $Self Care/Home Management : 23-37 mins  Ladene Artist, OTR/L [facility] Acute Rehabilitation Office: [phone]   Drue Novel 08/25/2021, 12:10 PM

## 2021-08-25 NOTE — Care Management Important Message (Signed)
Important Message  Patient Details  Name: Nathan Shepherd MRN: [id number] Date of Birth: [birth year]   Medicare Important Message Given:  Yes     Dorena Bodo 08/25/2021, 1:49 PM

## 2021-08-25 NOTE — Progress Notes (Signed)
ANTICOAGULATION CONSULT NOTE - Follow up   Pharmacy Consult for warfarin Indication: atrial fibrillation  Allergies  Allergen Reactions   Penicillins Hives    Patient Measurements: Height: 6\' 3"  (190.5 cm) IBW/kg (Calculated) : 84.5  Vital Signs: Temp: 98.5 F (36.9 C) (08/23 0745) Temp Source: Oral (08/23 0745) BP: 150/113 (08/23 0745) Pulse Rate: 68 (08/23 0745)  Labs: Recent Labs    08/23/21 0656 08/24/21 0240 08/25/21 0852  HGB  --  10.7* 11.0*  HCT  --  33.7* 34.4*  PLT  --  184 178  LABPROT 22.9* 22.4* 22.9*  INR 2.1* 2.0* 2.1*  CREATININE  --  1.40* 1.48*     Estimated Creatinine Clearance: 61.1 mL/min (A) (by C-G formula based on SCr of 1.48 mg/dL (H)).   Medical History: Past Medical History:  Diagnosis Date   CHF (congestive heart failure) (HCC)    CKD (chronic kidney disease) stage 3, GFR 30-59 ml/min (HCC)    Edema    Hyperlipemia    Hypertension    Seizures (HCC)     Medications:  Medications Prior to Admission  Medication Sig Dispense Refill Last Dose   acetaminophen (TYLENOL) 325 MG tablet Take 650 mg by mouth every 6 (six) hours as needed for mild pain.   unk   aspirin EC 81 MG tablet Take 81 mg by mouth daily.   8.18.2023   budesonide-formoterol (SYMBICORT) 160-4.5 MCG/ACT inhaler Inhale 2 puffs into the lungs in the morning and at bedtime.   8.18.2023   carvedilol (COREG) 12.5 MG tablet Take 12.5 mg by mouth 2 (two) times daily with a meal.   8.18.2023 at 0800   donepezil (ARICEPT) 5 MG tablet Take 5 mg by mouth at bedtime.   8.17.2023   levETIRAcetam (KEPPRA) 1000 MG tablet Take 1,000 mg by mouth 2 (two) times daily.   8.18.2023   losartan (COZAAR) 50 MG tablet Take 50 mg by mouth daily.   8.18.2023   NIFEdipine (PROCARDIA XL/NIFEDICAL XL) 60 MG 24 hr tablet Take 60 mg by mouth daily.   8.18.2023   rosuvastatin (CRESTOR) 20 MG tablet Take 20 mg by mouth at bedtime.    8.17.2023   Skin Protectants, Misc. (EUCERIN) cream Apply 1  application  topically daily. Apply to legs and feet once daily   8.18.2023   torsemide (DEMADEX) 20 MG tablet Take 20 mg by mouth daily.   8.18.2023   Vitamin D, Ergocalciferol, (DRISDOL) 1.25 MG (50000 UNIT) CAPS capsule Take 50,000 Units by mouth every 7 (seven) days. Thursday's   8.17.2023   warfarin (COUMADIN) 7.5 MG tablet Take 7.5 mg by mouth daily.   8.17.2023 at 1700   Scheduled:   acetylcysteine  4 mL Nebulization BID   albuterol  2.5 mg Nebulization BID   arformoterol  15 mcg Nebulization BID   aspirin  81 mg Oral Daily   atorvastatin  80 mg Oral Daily   budesonide (PULMICORT) nebulizer solution  0.5 mg Nebulization BID   divalproex  500 mg Oral Q12H   levETIRAcetam  1,500 mg Oral Q12H   pantoprazole  40 mg Oral Daily   Warfarin - Pharmacist Dosing Inpatient   Does not apply q1600    Assessment: 66 YO male transferred from [facility] 8/19 requiring intubation and continuous EEG monitoring following status epilepticus. The patient has a history of chronic afib. Based on facility [facility], patient has been on warfarin 7.5mg  once daily.  INR is currently therapeutic today.   Goal of Therapy:  INR  2-3 Monitor platelets by anticoagulation protocol: Yes   Plan:  Give warfarin 7.5mg  tonight per home dosing regimen.  INR today is therapeutic at 2.1  Change INR frequency to MWF starting 08/27/2021 Monitor CBC, signs/symptoms of bleeding   Greta Doom BS, PharmD, BCPS Clinical Pharmacist 08/25/2021 9:59 AM  Contact: [phone] after 3 PM  "Be curious, not judgmental..." -Debbora Dus

## 2021-08-25 NOTE — Progress Notes (Signed)
Physical Therapy Treatment Patient Details Name: Nathan Shepherd MRN: [id number] DOB: [birth year] Today's Date: 08/25/2021   History of Present Illness Patient is a 66 y/o male who was found unresponsive and seizing at his group home on 08/21/21, intubated 8/19-8/21, emergent bronchoscopy for poor respiration to remove mucus plug and thick secretions. Admitted with Status epilepticus and acute encephalopathy in the setting of seizures. Bed bugs present. PMH includes seizures, HTN, CHF, CKD.    PT Comments    Pt has improved over the evaluation.  Emphasis on safe sit to stand and progression of gait stability with the RW and IV pole to assess if ready for a less restrictive device.    Recommendations for follow up therapy are one component of a multi-disciplinary discharge planning process, led by the attending physician.  Recommendations may be updated based on patient status, additional functional criteria and insurance authorization.  Follow Up Recommendations  Home health PT     Assistance Recommended at Discharge Frequent or constant Supervision/Assistance  Patient can return home with the following A little help with walking and/or transfers;A little help with bathing/dressing/bathroom;Assistance with cooking/housework;Help with stairs or ramp for entrance;Direct supervision/assist for financial management;Direct supervision/assist for medications management;Assist for transportation   Equipment Recommendations  None recommended by PT    Recommendations for Other Services       Precautions / Restrictions Precautions Precautions: Fall Restrictions Weight Bearing Restrictions: No     Mobility  Bed Mobility               General bed mobility comments: OOB on arrival    Transfers Overall transfer level: Needs assistance Equipment used: Rolling walker (2 wheels) Transfers: Sit to/from Stand Sit to Stand: Min guard           General transfer comment: cues for  hant placement and coming forward.  No assist, but guarding is appropriate.    Ambulation/Gait Ambulation/Gait assistance: Min assist Gait Distance (Feet): 150 Feet (then additional 100 feet after standing rest to regroup) Assistive device: IV Pole, Rolling walker (2 wheels) Gait Pattern/deviations: Step-through pattern   Gait velocity interpretation: <1.8 ft/sec, indicate of risk for recurrent falls   General Gait Details: mildly unsteady overall, worsening with IV pole, drift left, soft stagger L to regain balance.  cues for proximity to the RW, posture and looking up and forward.   Stairs             Wheelchair Mobility    Modified Rankin (Stroke Patients Only)       Balance Overall balance assessment: Needs assistance Sitting-balance support: Feet supported, No upper extremity supported Sitting balance-Leahy Scale: Fair     Standing balance support: During functional activity, Single extremity supported, Bilateral upper extremity supported Standing balance-Leahy Scale: Poor Standing balance comment: reliant on AD or external support                            Cognition Arousal/Alertness: Awake/alert Behavior During Therapy: WFL for tasks assessed/performed Overall Cognitive Status: History of cognitive impairments - at baseline                                 General Comments: Follows commands well with repetition. Decreased awareness of safety.        Exercises      General Comments General comments (skin integrity, edema, etc.): vss  Pertinent Vitals/Pain Pain Assessment Pain Assessment: Faces Faces Pain Scale: No hurt Pain Intervention(s): Monitored during session    Home Living Family/patient expects to be discharged to:: Group home                        Prior Function            PT Goals (current goals can now be found in the care plan section) Acute Rehab PT Goals Patient Stated Goal: to get  stronger and go home PT Goal Formulation: With patient Time For Goal Achievement: 09/07/21 Potential to Achieve Goals: Good Progress towards PT goals: Progressing toward goals    Frequency    Min 3X/week      PT Plan Current plan remains appropriate    Co-evaluation              AM-PAC PT "6 Clicks" Mobility   Outcome Measure  Help needed turning from your back to your side while in a flat bed without using bedrails?: A Little Help needed moving from lying on your back to sitting on the side of a flat bed without using bedrails?: A Little Help needed moving to and from a bed to a chair (including a wheelchair)?: A Little Help needed standing up from a chair using your arms (e.g., wheelchair or bedside chair)?: A Little Help needed to walk in hospital room?: A Little Help needed climbing 3-5 steps with a railing? : A Lot 6 Click Score: 17    End of Session   Activity Tolerance: Patient tolerated treatment well Patient left: in chair;with call bell/phone within reach;with chair alarm set Nurse Communication: Mobility status PT Visit Diagnosis: Other abnormalities of gait and mobility (R26.89);Unsteadiness on feet (R26.81);Muscle weakness (generalized) (M62.81)     Time: 1020-1036 PT Time Calculation (min) (ACUTE ONLY): 16 min  Charges:  $Gait Training: 8-22 mins                     08/25/2021  Jacinto Halim., PT Acute Rehabilitation Services [phone]  (pager) [phone]  (office)   Eliseo Gum Jaime Grizzell 08/25/2021, 10:50 AM

## 2021-08-25 NOTE — Progress Notes (Signed)
[facility] PROGRESS NOTE    Progress Note  Nathan Shepherd  [id number] DOB: 01/15/1955 DOA: 08/21/2021 PCP: Virginia Rochester, NP     Brief Narrative:   Nathan Shepherd is an 66 y.o. male past medical history of seizures, essential hypertension chronic kidney disease stage III who presented to [facility] on 08/21/2021 for status epilepticus as per chart was found in the group home unresponsive EMS found him to be hypoxic and actively seizing with a right gaze was given 7.5 of Versed in route but he continued to seize intubated in the ED started on mechanical ventilation chest x-ray showed mucous plugging on the right side was emergently bronched at bedside with extensive mucous plugging removed.  Patient was loaded with Keppra he was treated as a severe COPD exacerbation with IV steroids antibiotics and inhalers. In addition to his respiratory failure he also had acute kidney injury neurology was consulted upon arrival to the ED.   Assessment/Plan:   Status epilepticus ([place]) Initially intubated now extubated. He stated he has been compliant with his medication Keppra was resumed EEG showed no signs of seizures. Neurology recommended Keppra 500 mg twice a day and Depakote 500 mg daily. They also recommended to current resume the nausea pill. Physical therapy evaluated the patient will need home health PT. Depakote level upon discharge in 5 days.  Acute respiratory failure with hypoxia in the setting of acute mucous plugging suspect aspiration pneumonia: CT showed pneumomediastinum and pulmonary nodules was successfully extubated on 08/24/2021. He is status post bronchoscopy started empirically on IV Zosyn and he will complete a 5-day course. Continue Mucomyst and albuterol. Continue chest physiotherapy Continue inhalers.  Chronic atrial fibrillation on Coumadin: Well controlled INR therapeutic continue aspirin and Plavix. Coumadin per pharmacy. Continue Coreg.  Acute  kidney injury on chronic kidney disease stage III AA/hypokalemia/hypomagnesemia/hypophosphatemia: Creatinine has returned to baseline potassium 3.5 try to keep above 4 replete orally magnesium is greater than 2. Phosphorus is low replete orally along with magnesium.   DVT prophylaxis: lovenox Family Communication:none Status is: Inpatient Remains inpatient appropriate because: Still with electrolyte imbalance.    Code Status:     Code Status Orders  (From admission, onward)           Start     Ordered   08/21/21 1748  Full code  Continuous        08/21/21 1750           Code Status History     Date Active Date Inactive Code Status Order ID Comments User Context   08/17/2016 0351 08/19/2016 1955 Full Code [id number]  Omar Person, NP Inpatient         IV Access:   Peripheral IV   Procedures and diagnostic studies:   No results found.   Medical Consultants:   None.   Subjective:    Nathan Shepherd no complaints today.  Objective:    Vitals:   08/25/21 0853 08/25/21 0854 08/25/21 0900 08/25/21 1105  BP:    (!) 140/107  Pulse:    85  Resp:    20  Temp:    98.5 F (36.9 C)  TempSrc:    Oral  SpO2: 99% 99% 99% 99%  Height:       SpO2: 99 % O2 Flow Rate (L/min): 2 L/min FiO2 (%): 40 %   Intake/Output Summary (Last 24 hours) at 08/25/2021 1232 Last data filed at 08/25/2021 0640 Gross per 24 hour  Intake 320 ml  Output  300 ml  Net 20 ml   There were no vitals filed for this visit.  Exam: General exam: In no acute distress. Respiratory system: Good air movement and clear to auscultation. Cardiovascular system: S1 & S2 heard, RRR.  Gastrointestinal system: Abdomen is nondistended, soft and nontender.  Extremities: No pedal edema. Skin: No rashes, lesions or ulcers Psychiatry: Judgement and insight appear normal. Mood & affect appropriate.    Data Reviewed:    Labs: Basic Metabolic Panel: Recent Labs  Lab  08/21/21 0812 08/21/21 1821 08/21/21 1852 08/22/21 0347 08/22/21 1935 08/23/21 0656 08/24/21 0240 08/25/21 0852  NA 140 140  --  137  --   --  140 141  K 3.4* 3.0*  --  3.3*  --   --  3.5 3.5  CL 106  --   --  105  --   --  110 110  CO2 27  --   --  19*  --   --  25 27  GLUCOSE 107*  --   --  134*  --   --  98 144*  BUN 14  --   --  11  --   --  16 15  CREATININE 1.52*  --   --  1.43*  --   --  1.40* 1.48*  CALCIUM 9.1  --   --  9.1  --   --  8.1* 8.7*  MG 1.9  --    < > 1.7 2.4 2.0 2.0 2.0  PHOS  --   --    < > <1.0* 2.6 1.9* 4.8* 2.0*   < > = values in this interval not displayed.   GFR Estimated Creatinine Clearance: 61.1 mL/min (A) (by C-G formula based on SCr of 1.48 mg/dL (H)). Liver Function Tests: Recent Labs  Lab 08/21/21 0812  AST 22  ALT 14  ALKPHOS 77  BILITOT 0.5  PROT 7.1  ALBUMIN 3.8   No results for input(s): "LIPASE", "AMYLASE" in the last 168 hours. No results for input(s): "AMMONIA" in the last 168 hours. Coagulation profile Recent Labs  Lab 08/21/21 0812 08/22/21 0347 08/23/21 0656 08/24/21 0240 08/25/21 0852  INR 1.7* 1.7* 2.1* 2.0* 2.1*   COVID-19 Labs  No results for input(s): "DDIMER", "FERRITIN", "LDH", "CRP" in the last 72 hours.  Lab Results  Component Value Date   SARSCOV2NAA NEGATIVE 08/21/2021    CBC: Recent Labs  Lab 08/21/21 0812 08/21/21 1821 08/22/21 0347 08/24/21 0240 08/25/21 0852  WBC 5.2  --  14.3* 6.1 5.7  NEUTROABS 4.2  --   --  4.3 4.0  HGB 11.4* 12.9* 12.7* 10.7* 11.0*  HCT 36.7* 38.0* 38.9* 33.7* 34.4*  MCV 81.6  --  80.5 80.6 80.8  PLT 222  --  206 184 178   Cardiac Enzymes: No results for input(s): "CKTOTAL", "CKMB", "CKMBINDEX", "TROPONINI" in the last 168 hours. BNP (last 3 results) No results for input(s): "PROBNP" in the last 8760 hours. CBG: Recent Labs  Lab 08/23/21 1557 08/23/21 2008 08/24/21 0013 08/24/21 0348 08/24/21 0809  GLUCAP 95 111* 117* 93 86   D-Dimer: No results for  input(s): "DDIMER" in the last 72 hours. Hgb A1c: No results for input(s): "HGBA1C" in the last 72 hours. Lipid Profile: No results for input(s): "CHOL", "HDL", "LDLCALC", "TRIG", "CHOLHDL", "LDLDIRECT" in the last 72 hours. Thyroid function studies: No results for input(s): "TSH", "T4TOTAL", "T3FREE", "THYROIDAB" in the last 72 hours.  Invalid input(s): "FREET3" Anemia work up: No results for  input(s): "VITAMINB12", "FOLATE", "FERRITIN", "TIBC", "IRON", "RETICCTPCT" in the last 72 hours. Sepsis Labs: Recent Labs  Lab 08/21/21 0812 08/21/21 1019 08/22/21 0347 08/24/21 0240 08/25/21 0852  WBC 5.2  --  14.3* 6.1 5.7  LATICACIDVEN 3.7* 3.2*  --   --   --    Microbiology Recent Results (from the past 240 hour(s))  SARS Coronavirus 2 by RT PCR (hospital order, performed in [facility] hospital lab) *cepheid single result test* Anterior Nasal Swab     Status: None   Collection Time: 08/21/21  8:43 AM   Specimen: Anterior Nasal Swab  Result Value Ref Range Status   SARS Coronavirus 2 by RT PCR NEGATIVE NEGATIVE Final    Comment: (NOTE) SARS-CoV-2 target nucleic acids are NOT DETECTED.  The SARS-CoV-2 RNA is generally detectable in upper and lower respiratory specimens during the acute phase of infection. The lowest concentration of SARS-CoV-2 viral copies this assay can detect is 250 copies / mL. A negative result does not preclude SARS-CoV-2 infection and should not be used as the sole basis for treatment or other patient management decisions.  A negative result may occur with improper specimen collection / handling, submission of specimen other than nasopharyngeal swab, presence of viral mutation(s) within the areas targeted by this assay, and inadequate number of viral copies (<250 copies / mL). A negative result must be combined with clinical observations, patient history, and epidemiological information.  Fact Sheet for Patients:    https://www.patel.info/  Fact Sheet for Healthcare Providers: https://hall.com/  This test is not yet approved or  cleared by the [place] FDA and has been authorized for detection and/or diagnosis of SARS-CoV-2 by FDA under an Emergency Use Authorization (EUA).  This EUA will remain in effect (meaning this test can be used) for the duration of the COVID-19 declaration under Section 564(b)(1) of the Act, 21 U.S.C. section 360bbb-3(b)(1), unless the authorization is terminated or revoked sooner.  Performed at [facility], [address] [place], [place] [address]   Blood culture (routine x 2)     Status: None (Preliminary result)   Collection Time: 08/21/21 10:15 AM   Specimen: BLOOD  Result Value Ref Range Status   Specimen Description BLOOD BLOOD LEFT WRIST  Final   Special Requests   Final    BOTTLES DRAWN AEROBIC AND ANAEROBIC Blood Culture adequate volume   Culture   Final    NO GROWTH 4 DAYS Performed at [facility], [address], [place], [place] [address]    Report Status PENDING  Incomplete  Blood culture (routine x 2)     Status: None (Preliminary result)   Collection Time: 08/21/21 10:19 AM   Specimen: BLOOD  Result Value Ref Range Status   Specimen Description BLOOD BLOOD LEFT FOREARM  Final   Special Requests   Final    BOTTLES DRAWN AEROBIC AND ANAEROBIC Blood Culture results may not be optimal due to an inadequate volume of blood received in culture bottles   Culture   Final    NO GROWTH 4 DAYS Performed at [facility], [address], [place] [address], [place] [address]    Report Status PENDING  Incomplete  Culture, Respiratory w Gram Stain     Status: None   Collection Time: 08/23/21  7:50 AM   Specimen: Tracheal Aspirate; Respiratory  Result Value Ref Range Status   Specimen Description TRACHEAL ASPIRATE  Final   Special Requests NONE  Final   Gram Stain   Final  ABUNDANT WBC  PRESENT, PREDOMINANTLY PMN FEW GRAM NEGATIVE RODS RARE GRAM POSITIVE COCCI IN PAIRS Performed at [facility], [address] [place], [place] [address]    Culture MODERATE STAPHYLOCOCCUS AUREUS  Final   Report Status 08/25/2021 FINAL  Final   Organism ID, Bacteria STAPHYLOCOCCUS AUREUS  Final      Susceptibility   Staphylococcus aureus - MIC*    CIPROFLOXACIN <=0.5 SENSITIVE Sensitive     ERYTHROMYCIN >=8 RESISTANT Resistant     GENTAMICIN <=0.5 SENSITIVE Sensitive     OXACILLIN <=0.25 SENSITIVE Sensitive     TETRACYCLINE <=1 SENSITIVE Sensitive     VANCOMYCIN 1 SENSITIVE Sensitive     TRIMETH/SULFA <=10 SENSITIVE Sensitive     CLINDAMYCIN <=0.25 SENSITIVE Sensitive     RIFAMPIN <=0.5 SENSITIVE Sensitive     Inducible Clindamycin NEGATIVE Sensitive     * MODERATE STAPHYLOCOCCUS AUREUS     Medications:    arformoterol  15 mcg Nebulization BID   aspirin  81 mg Oral Daily   atorvastatin  80 mg Oral Daily   budesonide (PULMICORT) nebulizer solution  0.5 mg Nebulization BID   divalproex  500 mg Oral Q12H   levETIRAcetam  1,500 mg Oral Q12H   pantoprazole  40 mg Oral Daily   warfarin  7.5 mg Oral q1600   Warfarin - Pharmacist Dosing Inpatient   Does not apply q1600   Continuous Infusions:  piperacillin-tazobactam (ZOSYN)  IV 3.375 g (08/25/21 0640)      LOS: 4 days   Marinda Elk  [facility]  08/25/2021, 12:32 PM

## 2021-08-26 DIAGNOSIS — E876 Hypokalemia: Secondary | ICD-10-CM | POA: Diagnosis not present

## 2021-08-26 DIAGNOSIS — J9600 Acute respiratory failure, unspecified whether with hypoxia or hypercapnia: Secondary | ICD-10-CM | POA: Diagnosis not present

## 2021-08-26 DIAGNOSIS — G40901 Epilepsy, unspecified, not intractable, with status epilepticus: Secondary | ICD-10-CM | POA: Diagnosis not present

## 2021-08-26 LAB — CBC WITH DIFFERENTIAL/PLATELET
Abs Immature Granulocytes: 0.01 10*3/uL (ref 0.00–0.07)
Basophils Absolute: 0 10*3/uL (ref 0.0–0.1)
Basophils Relative: 1 %
Eosinophils Absolute: 0.5 10*3/uL (ref 0.0–0.5)
Eosinophils Relative: 10 %
HCT: 31 % — ABNORMAL LOW (ref 39.0–52.0)
Hemoglobin: 9.8 g/dL — ABNORMAL LOW (ref 13.0–17.0)
Immature Granulocytes: 0 %
Lymphocytes Relative: 19 %
Lymphs Abs: 1 10*3/uL (ref 0.7–4.0)
MCH: 25.7 pg — ABNORMAL LOW (ref 26.0–34.0)
MCHC: 31.6 g/dL (ref 30.0–36.0)
MCV: 81.4 fL (ref 80.0–100.0)
Monocytes Absolute: 0.6 10*3/uL (ref 0.1–1.0)
Monocytes Relative: 13 %
Neutro Abs: 2.9 10*3/uL (ref 1.7–7.7)
Neutrophils Relative %: 57 %
Platelets: 174 10*3/uL (ref 150–400)
RBC: 3.81 MIL/uL — ABNORMAL LOW (ref 4.22–5.81)
RDW: 15.3 % (ref 11.5–15.5)
WBC: 5 10*3/uL (ref 4.0–10.5)
nRBC: 0.4 % — ABNORMAL HIGH (ref 0.0–0.2)

## 2021-08-26 LAB — RENAL FUNCTION PANEL
Albumin: 2.4 g/dL — ABNORMAL LOW (ref 3.5–5.0)
Anion gap: 6 (ref 5–15)
BUN: 15 mg/dL (ref 8–23)
CO2: 27 mmol/L (ref 22–32)
Calcium: 8.6 mg/dL — ABNORMAL LOW (ref 8.9–10.3)
Chloride: 109 mmol/L (ref 98–111)
Creatinine, Ser: 1.35 mg/dL — ABNORMAL HIGH (ref 0.61–1.24)
GFR, Estimated: 59 mL/min — ABNORMAL LOW (ref >60)
Glucose, Bld: 96 mg/dL (ref 70–99)
Phosphorus: 3.1 mg/dL (ref 2.5–4.6)
Potassium: 3.6 mmol/L (ref 3.5–5.1)
Sodium: 142 mmol/L (ref 135–145)

## 2021-08-26 LAB — CULTURE, BLOOD (ROUTINE X 2)
Culture: NO GROWTH
Culture: NO GROWTH
Special Requests: ADEQUATE

## 2021-08-26 LAB — MAGNESIUM: Magnesium: 1.9 mg/dL (ref 1.7–2.4)

## 2021-08-26 MED ORDER — AMLODIPINE BESYLATE 2.5 MG PO TABS
2.5000 mg | ORAL_TABLET | Freq: Every day | ORAL | Status: DC
  Administered 2021-08-26 – 2021-08-27 (×2): 2.5 mg via ORAL
  Filled 2021-08-26 (×2): qty 1

## 2021-08-26 MED ORDER — MAGNESIUM OXIDE -MG SUPPLEMENT 400 (240 MG) MG PO TABS
400.0000 mg | ORAL_TABLET | Freq: Two times a day (BID) | ORAL | Status: AC
  Administered 2021-08-26 (×2): 400 mg via ORAL
  Filled 2021-08-26 (×2): qty 1

## 2021-08-26 MED ORDER — HYDRALAZINE HCL 20 MG/ML IJ SOLN
10.0000 mg | INTRAMUSCULAR | Status: DC | PRN
Start: 2021-08-26 — End: 2021-08-28
  Administered 2021-08-26 – 2021-08-27 (×2): 10 mg via INTRAVENOUS
  Filled 2021-08-26 (×2): qty 1

## 2021-08-26 MED ORDER — POTASSIUM CHLORIDE 20 MEQ PO PACK
40.0000 meq | PACK | Freq: Two times a day (BID) | ORAL | Status: AC
  Administered 2021-08-26 (×2): 40 meq via ORAL
  Filled 2021-08-26 (×2): qty 2

## 2021-08-26 NOTE — Progress Notes (Signed)
Physical Therapy Treatment Patient Details Name: Nathan Shepherd MRN: [id number] DOB: 02-03-1955 Today's Date: 08/26/2021   History of Present Illness Patient is a 66 y/o male who was found unresponsive and seizing at his group home on 08/21/21, intubated 8/19-8/21, emergent bronchoscopy for poor respiration to remove mucus plug and thick secretions. Admitted with Status epilepticus and acute encephalopathy in the setting of seizures. Bed bugs present. PMH includes seizures, HTN, CHF, CKD.    PT Comments    Patient making steady progress with mobility and able to complete sit<>stand and bed mobility with no cues or assist for safety. Pt ambulated ~150' with RW and no LOB noted throughout. Assist needed to negotiate obstacles in hallway and turn smoothly. Pt's pace steady but slow overall and VSS throughout. Will continue to progress during acute stay and recommend he return home to ALF with HHPT and RW.     Recommendations for follow up therapy are one component of a multi-disciplinary discharge planning process, led by the attending physician.  Recommendations may be updated based on patient status, additional functional criteria and insurance authorization.  Follow Up Recommendations  Home health PT     Assistance Recommended at Discharge Frequent or constant Supervision/Assistance  Patient can return home with the following A little help with walking and/or transfers;A little help with bathing/dressing/bathroom;Assistance with cooking/housework;Help with stairs or ramp for entrance;Direct supervision/assist for financial management;Direct supervision/assist for medications management;Assist for transportation   Equipment Recommendations  Rolling walker (2 wheels)    Recommendations for Other Services       Precautions / Restrictions Precautions Precautions: Fall Restrictions Weight Bearing Restrictions: No     Mobility  Bed Mobility Overal bed mobility: Needs  Assistance Bed Mobility: Sit to Supine       Sit to supine: Supervision, HOB elevated   General bed mobility comments: OOB on arrival. supervision with assit for lines to return to supine.    Transfers Overall transfer level: Needs assistance Equipment used: Rolling walker (2 wheels) Transfers: Sit to/from Stand Sit to Stand: Min guard           General transfer comment: pt using bil UE to power up from recliner. no assit or cues but gaurding for safety.    Ambulation/Gait Ambulation/Gait assistance: Min Chemical engineer (Feet): 150 Feet Assistive device: IV Pole, Rolling walker (2 wheels) Gait Pattern/deviations: Step-through pattern Gait velocity: decr, pt slow and cautious with RW     General Gait Details: pt slighlty unsteady but no LOB noted throughout. no excessive ant/post lean with gait. Assist to turn smoothly and negotiate obstacles in hallway.   Stairs             Wheelchair Mobility    Modified Rankin (Stroke Patients Only)       Balance Overall balance assessment: Needs assistance Sitting-balance support: Feet supported, No upper extremity supported Sitting balance-Leahy Scale: Fair     Standing balance support: During functional activity, Single extremity supported, Bilateral upper extremity supported Standing balance-Leahy Scale: Poor Standing balance comment: reliant on RW                            Cognition Arousal/Alertness: Awake/alert Behavior During Therapy: WFL for tasks assessed/performed Overall Cognitive Status: History of cognitive impairments - at baseline  General Comments: Very pleasant. Follows commands well with repetition. Decreased awareness of safety.        Exercises      General Comments General comments (skin integrity, edema, etc.): VSS, HR from 80-100 bpm      Pertinent Vitals/Pain Pain Assessment Pain Assessment: No/denies pain Pain  Score: 0-No pain Faces Pain Scale: No hurt Pain Intervention(s): Monitored during session    Home Living                          Prior Function            PT Goals (current goals can now be found in the care plan section) Acute Rehab PT Goals Patient Stated Goal: to get stronger and go home PT Goal Formulation: With patient Time For Goal Achievement: 09/07/21 Potential to Achieve Goals: Good Progress towards PT goals: Progressing toward goals    Frequency    Min 3X/week      PT Plan Current plan remains appropriate    Co-evaluation              AM-PAC PT "6 Clicks" Mobility   Outcome Measure  Help needed turning from your back to your side while in a flat bed without using bedrails?: A Little Help needed moving from lying on your back to sitting on the side of a flat bed without using bedrails?: A Little Help needed moving to and from a bed to a chair (including a wheelchair)?: A Little Help needed standing up from a chair using your arms (e.g., wheelchair or bedside chair)?: A Little Help needed to walk in hospital room?: A Little Help needed climbing 3-5 steps with a railing? : A Little 6 Click Score: 18    End of Session Equipment Utilized During Treatment: Gait belt Activity Tolerance: Patient tolerated treatment well Patient left: in chair;with call bell/phone within reach;with chair alarm set Nurse Communication: Mobility status PT Visit Diagnosis: Other abnormalities of gait and mobility (R26.89);Unsteadiness on feet (R26.81);Muscle weakness (generalized) (M62.81)     Time: 3149-7026 PT Time Calculation (min) (ACUTE ONLY): 21 min  Charges:  $Gait Training: 8-22 mins                      Wynn Maudlin, DPT Acute Rehabilitation Services Office [phone] Pager [phone]  08/26/21 12:14 PM

## 2021-08-26 NOTE — Progress Notes (Signed)
[facility] PROGRESS NOTE    Progress Note  Nathan Shepherd  [id number] DOB: 01-May-1955 DOA: 08/21/2021 PCP: Virginia Rochester, NP     Brief Narrative:   Nathan Shepherd is an 66 y.o. male past medical history of seizures, essential hypertension chronic kidney disease stage III who presented to [facility] on 08/21/2021 for status epilepticus as per chart was found in the group home unresponsive EMS found him to be hypoxic and actively seizing with a right gaze was given 7.5 of Versed in route but he continued to seize intubated in the ED started on mechanical ventilation chest x-ray showed mucous plugging on the right side was emergently bronched at bedside with extensive mucous plugging removed.  Patient was loaded with Keppra he was treated as a severe COPD exacerbation with IV steroids antibiotics and inhalers. In addition to his respiratory failure he also had acute kidney injury neurology was consulted upon arrival to the ED.   Assessment/Plan:   Status epilepticus ([place]) Initially intubated now extubated. He stated he has been compliant with his medication. Keppra was resumed EEG showed no signs of seizures. Neurology recommended Keppra 500 mg twice a day and Depakote 500 mg daily. Physical therapy evaluated the patient will need home health PT. Depakote level upon discharge in 5 days.  Acute respiratory failure with hypoxia in the setting of acute mucous plugging suspect aspiration pneumonia: CT showed pneumomediastinum and pulmonary nodules was successfully extubated on 08/24/2021. He is status post bronchoscopy started empirically on IV Zosyn and he will complete a 5-day course. Continue Mucomyst and albuterol. Continue chest physiotherapy Continue inhalers.  Chronic atrial fibrillation on Coumadin: Rte controlled INR therapeutic continue coumadin. Coumadin per pharmacy. Continue Coreg.  Acute kidney injury on chronic kidney disease stage III  AA/hypokalemia/hypomagnesemia/hypophosphatemia: Creatinine has returned to baseline potassium 3.5 try to keep above 4 replete orally magnesium is greater than 2. Phosphorus is improved but potassium is borderline we will replete orally or can recheck tomorrow morning.  Newly diagnosed essential hypertension: Start him on Norvasc low-dose.   DVT prophylaxis: lovenox Family Communication:none Status is: Inpatient Remains inpatient appropriate because: Still with electrolyte imbalance.    Code Status:     Code Status Orders  (From admission, onward)           Start     Ordered   08/21/21 1748  Full code  Continuous        08/21/21 1750           Code Status History     Date Active Date Inactive Code Status Order ID Comments User Context   08/17/2016 0351 08/19/2016 1955 Full Code [id number]  Omar Person, NP Inpatient         IV Access:   Peripheral IV   Procedures and diagnostic studies:   No results found.   Medical Consultants:   None.   Subjective:    Nathan Shepherd no complaints.  Objective:    Vitals:   08/26/21 0436 08/26/21 0801 08/26/21 0805 08/26/21 0854  BP: (!) 153/108 (!) 168/117 (!) 164/111   Pulse: 62 66  71  Resp: 20 20  19   Temp: 98.4 F (36.9 C) 98.5 F (36.9 C)    TempSrc: Oral Oral    SpO2: 100% 100%  100%  Height:       SpO2: 100 % O2 Flow Rate (L/min): 2 L/min FiO2 (%): 40 %  No intake or output data in the 24 hours ending 08/26/21 0958  There  were no vitals filed for this visit.  Exam: General exam: In no acute distress. Respiratory system: Good air movement and clear to auscultation. Cardiovascular system: S1 & S2 heard, RRR. No JVD. Gastrointestinal system: Abdomen is nondistended, soft and nontender.  Extremities: No pedal edema. Skin: No rashes, lesions or ulcers Psychiatry: Judgement and insight appear normal. Mood & affect appropriate.   Data Reviewed:    Labs: Basic Metabolic  Panel: Recent Labs  Lab 08/21/21 0812 08/21/21 1821 08/21/21 1852 08/22/21 0347 08/22/21 1935 08/23/21 0656 08/24/21 0240 08/25/21 0852 08/26/21 0314  NA 140 140  --  137  --   --  140 141 142  K 3.4* 3.0*  --  3.3*  --   --  3.5 3.5 3.6  CL 106  --   --  105  --   --  110 110 109  CO2 27  --   --  19*  --   --  25 27 27   GLUCOSE 107*  --   --  134*  --   --  98 144* 96  BUN 14  --   --  11  --   --  16 15 15   CREATININE 1.52*  --   --  1.43*  --   --  1.40* 1.48* 1.35*  CALCIUM 9.1  --   --  9.1  --   --  8.1* 8.7* 8.6*  MG 1.9  --    < > 1.7 2.4 2.0 2.0 2.0 1.9  PHOS  --   --    < > <1.0* 2.6 1.9* 4.8* 2.0* 3.1   < > = values in this interval not displayed.    GFR Estimated Creatinine Clearance: 66.9 mL/min (A) (by C-G formula based on SCr of 1.35 mg/dL (H)). Liver Function Tests: Recent Labs  Lab 08/21/21 0812 08/26/21 0314  AST 22  --   ALT 14  --   ALKPHOS 77  --   BILITOT 0.5  --   PROT 7.1  --   ALBUMIN 3.8 2.4*    No results for input(s): "LIPASE", "AMYLASE" in the last 168 hours. No results for input(s): "AMMONIA" in the last 168 hours. Coagulation profile Recent Labs  Lab 08/21/21 0812 08/22/21 0347 08/23/21 0656 08/24/21 0240 08/25/21 0852  INR 1.7* 1.7* 2.1* 2.0* 2.1*    COVID-19 Labs  No results for input(s): "DDIMER", "FERRITIN", "LDH", "CRP" in the last 72 hours.  Lab Results  Component Value Date   SARSCOV2NAA NEGATIVE 08/21/2021    CBC: Recent Labs  Lab 08/21/21 0812 08/21/21 1821 08/22/21 0347 08/24/21 0240 08/25/21 0852 08/26/21 0314  WBC 5.2  --  14.3* 6.1 5.7 5.0  NEUTROABS 4.2  --   --  4.3 4.0 2.9  HGB 11.4* 12.9* 12.7* 10.7* 11.0* 9.8*  HCT 36.7* 38.0* 38.9* 33.7* 34.4* 31.0*  MCV 81.6  --  80.5 80.6 80.8 81.4  PLT 222  --  206 184 178 174    Cardiac Enzymes: No results for input(s): "CKTOTAL", "CKMB", "CKMBINDEX", "TROPONINI" in the last 168 hours. BNP (last 3 results) No results for input(s): "PROBNP" in the  last 8760 hours. CBG: Recent Labs  Lab 08/23/21 1557 08/23/21 2008 08/24/21 0013 08/24/21 0348 08/24/21 0809  GLUCAP 95 111* 117* 93 86    D-Dimer: No results for input(s): "DDIMER" in the last 72 hours. Hgb A1c: No results for input(s): "HGBA1C" in the last 72 hours. Lipid Profile: No results for input(s): "CHOL", "HDL", "LDLCALC", "  TRIG", "CHOLHDL", "LDLDIRECT" in the last 72 hours. Thyroid function studies: No results for input(s): "TSH", "T4TOTAL", "T3FREE", "THYROIDAB" in the last 72 hours.  Invalid input(s): "FREET3" Anemia work up: No results for input(s): "VITAMINB12", "FOLATE", "FERRITIN", "TIBC", "IRON", "RETICCTPCT" in the last 72 hours. Sepsis Labs: Recent Labs  Lab 08/21/21 0812 08/21/21 1019 08/22/21 0347 08/24/21 0240 08/25/21 0852 08/26/21 0314  WBC 5.2  --  14.3* 6.1 5.7 5.0  LATICACIDVEN 3.7* 3.2*  --   --   --   --     Microbiology Recent Results (from the past 240 hour(s))  SARS Coronavirus 2 by RT PCR (hospital order, performed in [facility] lab) *cepheid single result test* Anterior Nasal Swab     Status: None   Collection Time: 08/21/21  8:43 AM   Specimen: Anterior Nasal Swab  Result Value Ref Range Status   SARS Coronavirus 2 by RT PCR NEGATIVE NEGATIVE Final    Comment: (NOTE) SARS-CoV-2 target nucleic acids are NOT DETECTED.  The SARS-CoV-2 RNA is generally detectable in upper and lower respiratory specimens during the acute phase of infection. The lowest concentration of SARS-CoV-2 viral copies this assay can detect is 250 copies / mL. A negative result does not preclude SARS-CoV-2 infection and should not be used as the sole basis for treatment or other patient management decisions.  A negative result may occur with improper specimen collection / handling, submission of specimen other than nasopharyngeal swab, presence of viral mutation(s) within the areas targeted by this assay, and inadequate number of viral copies (<250  copies / mL). A negative result must be combined with clinical observations, patient history, and epidemiological information.  Fact Sheet for Patients:   RoadLapTop.co.za  Fact Sheet for Healthcare Providers: http://kim-miller.com/  This test is not yet approved or  cleared by the [place] FDA and has been authorized for detection and/or diagnosis of SARS-CoV-2 by FDA under an Emergency Use Authorization (EUA).  This EUA will remain in effect (meaning this test can be used) for the duration of the COVID-19 declaration under Section 564(b)(1) of the Act, 21 U.S.C. section 360bbb-3(b)(1), unless the authorization is terminated or revoked sooner.  Performed at [facility], [address] [place], [place] [address]   Blood culture (routine x 2)     Status: None   Collection Time: 08/21/21 10:15 AM   Specimen: BLOOD  Result Value Ref Range Status   Specimen Description BLOOD BLOOD LEFT WRIST  Final   Special Requests   Final    BOTTLES DRAWN AEROBIC AND ANAEROBIC Blood Culture adequate volume   Culture   Final    NO GROWTH 5 DAYS Performed at [facility], [address] [place], [place] [address]    Report Status 08/26/2021 FINAL  Final  Blood culture (routine x 2)     Status: None   Collection Time: 08/21/21 10:19 AM   Specimen: BLOOD  Result Value Ref Range Status   Specimen Description BLOOD BLOOD LEFT FOREARM  Final   Special Requests   Final    BOTTLES DRAWN AEROBIC AND ANAEROBIC Blood Culture results may not be optimal due to an inadequate volume of blood received in culture bottles   Culture   Final    NO GROWTH 5 DAYS Performed at [facility], [address] [place], [place] [address]    Report Status 08/26/2021 FINAL  Final  Culture, Respiratory w Gram Stain     Status: None   Collection Time:  08/23/21  7:50 AM   Specimen: Tracheal Aspirate; Respiratory  Result Value Ref Range  Status   Specimen Description TRACHEAL ASPIRATE  Final   Special Requests NONE  Final   Gram Stain   Final    ABUNDANT WBC PRESENT, PREDOMINANTLY PMN FEW GRAM NEGATIVE RODS RARE GRAM POSITIVE COCCI IN PAIRS Performed at [facility], [address], [place], [place] [address]    Culture MODERATE STAPHYLOCOCCUS AUREUS  Final   Report Status 08/25/2021 FINAL  Final   Organism ID, Bacteria STAPHYLOCOCCUS AUREUS  Final      Susceptibility   Staphylococcus aureus - MIC*    CIPROFLOXACIN <=0.5 SENSITIVE Sensitive     ERYTHROMYCIN >=8 RESISTANT Resistant     GENTAMICIN <=0.5 SENSITIVE Sensitive     OXACILLIN <=0.25 SENSITIVE Sensitive     TETRACYCLINE <=1 SENSITIVE Sensitive     VANCOMYCIN 1 SENSITIVE Sensitive     TRIMETH/SULFA <=10 SENSITIVE Sensitive     CLINDAMYCIN <=0.25 SENSITIVE Sensitive     RIFAMPIN <=0.5 SENSITIVE Sensitive     Inducible Clindamycin NEGATIVE Sensitive     * MODERATE STAPHYLOCOCCUS AUREUS     Medications:    arformoterol  15 mcg Nebulization BID   aspirin  81 mg Oral Daily   atorvastatin  80 mg Oral Daily   budesonide (PULMICORT) nebulizer solution  0.5 mg Nebulization BID   divalproex  500 mg Oral Q12H   levETIRAcetam  1,500 mg Oral Q12H   pantoprazole  40 mg Oral Daily   warfarin  7.5 mg Oral q1600   Warfarin - Pharmacist Dosing Inpatient   Does not apply q1600   Continuous Infusions:  piperacillin-tazobactam (ZOSYN)  IV 3.375 g (08/26/21 0531)      LOS: 5 days   Marinda Elk  [facility]  08/26/2021, 9:58 AM

## 2021-08-27 DIAGNOSIS — J9600 Acute respiratory failure, unspecified whether with hypoxia or hypercapnia: Secondary | ICD-10-CM | POA: Diagnosis not present

## 2021-08-27 DIAGNOSIS — E876 Hypokalemia: Secondary | ICD-10-CM | POA: Diagnosis not present

## 2021-08-27 DIAGNOSIS — G40901 Epilepsy, unspecified, not intractable, with status epilepticus: Secondary | ICD-10-CM | POA: Diagnosis not present

## 2021-08-27 LAB — PROTIME-INR
INR: 3 — ABNORMAL HIGH (ref 0.8–1.2)
Prothrombin Time: 30.6 seconds — ABNORMAL HIGH (ref 11.4–15.2)

## 2021-08-27 LAB — RENAL FUNCTION PANEL
Albumin: 2.6 g/dL — ABNORMAL LOW (ref 3.5–5.0)
Anion gap: <0 — ABNORMAL LOW (ref 5–15)
BUN: 12 mg/dL (ref 8–23)
CO2: 29 mmol/L (ref 22–32)
Calcium: 8.4 mg/dL — ABNORMAL LOW (ref 8.9–10.3)
Chloride: 113 mmol/L — ABNORMAL HIGH (ref 98–111)
Creatinine, Ser: 1.29 mg/dL — ABNORMAL HIGH (ref 0.61–1.24)
GFR, Estimated: >60 mL/min (ref >60)
Glucose, Bld: 91 mg/dL (ref 70–99)
Phosphorus: 2.3 mg/dL — ABNORMAL LOW (ref 2.5–4.6)
Potassium: 3.7 mmol/L (ref 3.5–5.1)
Sodium: 140 mmol/L (ref 135–145)

## 2021-08-27 MED ORDER — LISINOPRIL 2.5 MG PO TABS
5.0000 mg | ORAL_TABLET | Freq: Every day | ORAL | Status: DC
  Administered 2021-08-27: 5 mg via ORAL
  Filled 2021-08-27: qty 2

## 2021-08-27 MED ORDER — K PHOS MONO-SOD PHOS DI & MONO 155-852-130 MG PO TABS
500.0000 mg | ORAL_TABLET | Freq: Two times a day (BID) | ORAL | Status: AC
  Administered 2021-08-27 (×2): 500 mg via ORAL
  Filled 2021-08-27 (×2): qty 2

## 2021-08-27 NOTE — Progress Notes (Signed)
Occupational Therapy Treatment Patient Details Name: Nathan Shepherd MRN: [id number] DOB: 03-19-1955 Today's Date: 08/27/2021   History of present illness Patient is a 66 y/o male who was found unresponsive and seizing at his group home on 08/21/21, intubated 8/19-8/21, emergent bronchoscopy for poor respiration to remove mucus plug and thick secretions. Admitted with Status epilepticus and acute encephalopathy in the setting of seizures. Bed bugs present. PMH includes seizures, HTN, CHF, CKD.   OT comments  Pt progressing towards established OT goals. Pt performing transfers from EOB with min guard and from toilet with min A and verbal cues for foot placement. Pt performing functional mobility using RW and grooming at sink with min guard A. Pt with little anticipatory awareness of need to use restroom this date, thus experiencing urgency and min accident on way to restroom. Pt observed to frequently repeat same story, and stating to OT "I did not know you work here", despite having only met OT at hospital for eval two days prior. Unsure if this is pt's baseline cognition. Pt continues to present with decreased strength and balance. Continue to recommend HHOT to optimize safety and independence with ADL and IADL.    Recommendations for follow up therapy are one component of a multi-disciplinary discharge planning process, led by the attending physician.  Recommendations may be updated based on patient status, additional functional criteria and insurance authorization.    Follow Up Recommendations  Home health OT    Assistance Recommended at Discharge Frequent or constant Supervision/Assistance  Patient can return home with the following  A little help with walking and/or transfers;A little help with bathing/dressing/bathroom;Assistance with cooking/housework;Direct supervision/assist for medications management;Direct supervision/assist for financial management;Assist for transportation    Equipment Recommendations  Other (comment) (determin equipment at facility. If none, recommend BSC)    Recommendations for Other Services      Precautions / Restrictions Precautions Precautions: Fall Restrictions Weight Bearing Restrictions: No       Mobility Bed Mobility Overal bed mobility: Needs Assistance Bed Mobility: Supine to Sit     Supine to sit: Supervision, HOB elevated     General bed mobility comments: Supervision for safety    Transfers Overall transfer level: Needs assistance Equipment used: Rolling walker (2 wheels) Transfers: Sit to/from Stand Sit to Stand: Min guard           General transfer comment: Pt using bil UE to power up. min cues for safety     Balance Overall balance assessment: Needs assistance Sitting-balance support: Feet supported, No upper extremity supported Sitting balance-Leahy Scale: Fair Sitting balance - Comments: donning socks with min guard sitting EOB   Standing balance support: During functional activity, Single extremity supported, Bilateral upper extremity supported Standing balance-Leahy Scale: Poor Standing balance comment: reliant on RW during dynamic balance. Statically not requiring support during ADL at sink                           ADL either performed or assessed with clinical judgement   ADL Overall ADL's : Needs assistance/impaired     Grooming: Wash/dry hands;Oral care;Min guard;Standing Grooming Details (indicate cue type and reason): Min guard A for safety. With good static balance. Leaning forward to bring water to his mouth with cupped hands with min guard A             Lower Body Dressing: Min guard;Sitting/lateral leans Lower Body Dressing Details (indicate cue type and reason): Donning socks with min  guard A for sitting balance. Significantly increased time and effort, noting decreased dexterity in Bil hands, esp L hand during donning Toilet Transfer: Minimal assistance;Rolling  walker (2 wheels);Grab bars;Regular Glass blower/designer Details (indicate cue type and reason): Min A for achievement of upright position. Cues to place feet in safe position before rising, as R hip was externally rotated from having performed posterior pericare.Increased time. Greater management of RW this date Toileting- Water quality scientist and Hygiene: Supervision/safety;Sitting/lateral lean Toileting - Clothing Manipulation Details (indicate cue type and reason): Pt performing posterior pericare with supervision. Cues for ensuring all areas are clean     Functional mobility during ADLs: Min guard;Rolling walker (2 wheels) General ADL Comments: Pt limited by balance, strength, and cognition.    Extremity/Trunk Assessment Upper Extremity Assessment Upper Extremity Assessment: Generalized weakness   Lower Extremity Assessment Lower Extremity Assessment: Generalized weakness        Vision   Vision Assessment?: Vision impaired- to be further tested in functional context Additional Comments: Pt reporting he has glasses at baseline. Able to locate grooming items, but asking therapist to read labels and to help him navigate to read buttons on TV.   Perception     Praxis      Cognition Arousal/Alertness: Awake/alert Behavior During Therapy: WFL for tasks assessed/performed Overall Cognitive Status: History of cognitive impairments - at baseline                                 General Comments: Following commands with increased time. Observed to repeat same information throughout session reminiscing on famous people he has met or seen before and telling therapist he has same brithday as beyonce 4x this session. When discussing food he has ordered for dinner, inconsistent report, and reporting "my memory fails me sometimes". Cues for safe use of RW. Pt with little awareness of need to perform bowel movement until actively going and then stating "we need to get to the  restroom", however, min accident in floor before making it all the way into restroom.        Exercises      Shoulder Instructions       General Comments VSS    Pertinent Vitals/ Pain       Pain Assessment Pain Assessment: No/denies pain Pain Intervention(s): Monitored during session  Home Living                                          Prior Functioning/Environment              Frequency  Min 2X/week        Progress Toward Goals  OT Goals(current goals can now be found in the care plan section)  Progress towards OT goals: Progressing toward goals  Acute Rehab OT Goals Patient Stated Goal: eat pizza OT Goal Formulation: With patient Time For Goal Achievement: 09/08/21 Potential to Achieve Goals: Good ADL Goals Pt Will Perform Grooming: with modified independence;standing Pt Will Perform Lower Body Dressing: with modified independence;sit to/from stand Pt Will Transfer to Toilet: with modified independence;ambulating;regular height toilet Pt Will Perform Tub/Shower Transfer: Tub transfer;with supervision;ambulating;rolling walker  Plan Discharge plan remains appropriate    Co-evaluation                 AM-PAC OT "6 Clicks" Daily Activity  Outcome Measure   Help from another person eating meals?: None Help from another person taking care of personal grooming?: A Little Help from another person toileting, which includes using toliet, bedpan, or urinal?: A Little Help from another person bathing (including washing, rinsing, drying)?: A Little Help from another person to put on and taking off regular upper body clothing?: A Little Help from another person to put on and taking off regular lower body clothing?: A Little 6 Click Score: 19    End of Session Equipment Utilized During Treatment: Gait belt;Rolling walker (2 wheels)  OT Visit Diagnosis: Unsteadiness on feet (R26.81);Muscle weakness (generalized) (M62.81);Other  abnormalities of gait and mobility (R26.89);Other symptoms and signs involving cognitive function;Low vision, both eyes (H54.2)   Activity Tolerance Patient tolerated treatment well   Patient Left in chair;with call bell/phone within reach;with chair alarm set   Nurse Communication Mobility status        Time: 0354-6568 OT Time Calculation (min): 25 min  Charges: OT General Charges $OT Visit: 1 Visit OT Treatments $Self Care/Home Management : 23-37 mins  Shanda Howells, OTR/L [facility] Acute Rehabilitation Office: [phone]   Lula Olszewski 08/27/2021, 3:59 PM

## 2021-08-27 NOTE — TOC Initial Note (Signed)
Transition of Care [facility]) - Initial/Assessment Note    Patient Details  Name: Nathan Shepherd MRN: [id number] Date of Birth: 08-06-1955  [facility] CM/SW Contact:    Kermit Balo, RN Phone Number: 08/27/2021, 10:13 AM  Clinical Narrative:                 Pt is from [facility] in [place]. Plan is for him to return tomorrow per MD. CM has updated his POA and the facility.  Home health arranged with [facility]. Information on the AVS.  Group home will provide transport home.   Expected Discharge Plan: Group Home Barriers to Discharge: Continued Medical Work up   Patient Goals and CMS Choice   CMS Medicare.gov Compare Post Acute Care list provided to:: Patient Represenative (must comment) Choice offered to / list presented to : [facility] POA / Guardian  Expected Discharge Plan and Services Expected Discharge Plan: Group Home   Discharge Planning Services: CM Consult Post Acute Care Choice: Home Health Living arrangements for the past 2 months: Group Home                                      Prior Living Arrangements/Services Living arrangements for the past 2 months: Group Home Lives with:: Facility Resident Patient language and need for interpreter reviewed:: Yes Do you feel safe going back to the place where you live?: Yes            Criminal Activity/Legal Involvement Pertinent to Current Situation/Hospitalization: No - Comment as needed  Activities of Daily Living      Permission Sought/Granted                  Emotional Assessment Appearance:: Appears stated age Attitude/Demeanor/Rapport: Engaged Affect (typically observed): Accepting Orientation: : Oriented to Self, Oriented to Place, Oriented to Situation, Oriented to  Time   Psych Involvement: No (comment)  Admission diagnosis:  Status epilepticus (HCC) [G40.901] Patient Active Problem List   Diagnosis Date Noted   Hypokalemia 08/25/2021   Acute respiratory  failure with hypoxia (HCC) 08/25/2021   Chronic atrial fibrillation with RVR (HCC) 08/25/2021   Aspiration pneumonia (HCC) 08/25/2021   AKI (acute kidney injury) (HCC) 08/25/2021   Chronic kidney disease, stage III (moderate) (HCC) 08/25/2021   Hypophosphatemia 08/25/2021   Hypomagnesemia 08/25/2021   Status epilepticus (HCC) 08/21/2021   Altered mental state 08/17/2016   Edema    PCP:  Rush Farmer, NP Pharmacy:   [facility] - [place], [place] - [address] [place] [address] Phone: [phone] Fax: [phone]     Social Determinants of Health (SDOH) Interventions    Readmission Risk Interventions     No data to display

## 2021-08-27 NOTE — Progress Notes (Signed)
ANTICOAGULATION CONSULT NOTE - Follow up   Pharmacy Consult for warfarin Indication: atrial fibrillation  Allergies  Allergen Reactions   Penicillins Hives    Tolerated Zosyn    Patient Measurements: Height: 6\' 3"  (190.5 cm) IBW/kg (Calculated) : 84.5  Vital Signs: Temp: 98.8 F (37.1 C) (08/25 1225) Temp Source: Oral (08/25 1225) BP: 169/105 (08/25 1225) Pulse Rate: 61 (08/25 1225)  Labs: Recent Labs    08/25/21 0852 08/26/21 0314 08/27/21 0642  HGB 11.0* 9.8*  --   HCT 34.4* 31.0*  --   PLT 178 174  --   LABPROT 22.9*  --  30.6*  INR 2.1*  --  3.0*  CREATININE 1.48* 1.35* 1.29*     Estimated Creatinine Clearance: 70.1 mL/min (A) (by C-G formula based on SCr of 1.29 mg/dL (H)).   Medical History: Past Medical History:  Diagnosis Date   CHF (congestive heart failure) (HCC)    CKD (chronic kidney disease) stage 3, GFR 30-59 ml/min (HCC)    Edema    Hyperlipemia    Hypertension    Seizures (HCC)     Medications:  Medications Prior to Admission  Medication Sig Dispense Refill Last Dose   acetaminophen (TYLENOL) 325 MG tablet Take 650 mg by mouth every 6 (six) hours as needed for mild pain.   unk   aspirin EC 81 MG tablet Take 81 mg by mouth daily.   8.18.2023   budesonide-formoterol (SYMBICORT) 160-4.5 MCG/ACT inhaler Inhale 2 puffs into the lungs in the morning and at bedtime.   8.18.2023   carvedilol (COREG) 12.5 MG tablet Take 12.5 mg by mouth 2 (two) times daily with a meal.   8.18.2023 at 0800   donepezil (ARICEPT) 5 MG tablet Take 5 mg by mouth at bedtime.   8.17.2023   levETIRAcetam (KEPPRA) 1000 MG tablet Take 1,000 mg by mouth 2 (two) times daily.   8.18.2023   losartan (COZAAR) 50 MG tablet Take 50 mg by mouth daily.   8.18.2023   NIFEdipine (PROCARDIA XL/NIFEDICAL XL) 60 MG 24 hr tablet Take 60 mg by mouth daily.   8.18.2023   rosuvastatin (CRESTOR) 20 MG tablet Take 20 mg by mouth at bedtime.    8.17.2023   Skin Protectants, Misc. (EUCERIN)  cream Apply 1 application  topically daily. Apply to legs and feet once daily   8.18.2023   torsemide (DEMADEX) 20 MG tablet Take 20 mg by mouth daily.   8.18.2023   Vitamin D, Ergocalciferol, (DRISDOL) 1.25 MG (50000 UNIT) CAPS capsule Take 50,000 Units by mouth every 7 (seven) days. Thursday's   8.17.2023   warfarin (COUMADIN) 7.5 MG tablet Take 7.5 mg by mouth daily.   8.17.2023 at 1700   Scheduled:   amLODipine  2.5 mg Oral Daily   arformoterol  15 mcg Nebulization BID   aspirin  81 mg Oral Daily   atorvastatin  80 mg Oral Daily   budesonide (PULMICORT) nebulizer solution  0.5 mg Nebulization BID   divalproex  500 mg Oral Q12H   levETIRAcetam  1,500 mg Oral Q12H   lisinopril  5 mg Oral Daily   pantoprazole  40 mg Oral Daily   phosphorus  500 mg Oral BID   warfarin  7.5 mg Oral q1600   Warfarin - Pharmacist Dosing Inpatient   Does not apply q1600    Assessment: 66 YO male transferred from [facility] 8/19 requiring intubation and continuous EEG monitoring following status epilepticus. The patient has a history of chronic afib. Based on  facility [facility], patient has been on warfarin 7.5mg  once daily.  INR is currently therapeutic today, on warfarin 7.5 mg daily.  Hgb 11.0 trended down to 9.8 and pltc wnl/ stable on 08/26/21.  No bleeding noted.   Goal of Therapy:  INR 2-3 Monitor platelets by anticoagulation protocol: Yes   Plan:  Give warfarin 7.5mg   daily ,  per home dosing regimen.  Monitor INR  tomorrow 8/26 then  every  MWF  Monitor CBC, signs/symptoms of bleeding    Noah Delaine, RPh Clinical Pharmacist  08/27/2021 2:48 PM Please check AMION for all [facility]acy phone numbers After 10:00 PM, call Main Pharmacy [phone]

## 2021-08-27 NOTE — Progress Notes (Addendum)
[facility] PROGRESS NOTE    Progress Note  Nathan Shepherd  [id number] DOB: 1955/12/02 DOA: 08/21/2021 PCP: Virginia Rochester, NP     Brief Narrative:   Nathan Shepherd is an 66 y.o. male past medical history of seizures, essential hypertension chronic kidney disease stage III who presented to [facility] on 08/21/2021 for status epilepticus as per chart was found in the group home unresponsive EMS found him to be hypoxic and actively seizing with a right gaze was given 7.5 of Versed in route but he continued to seize intubated in the ED started on mechanical ventilation chest x-ray showed mucous plugging on the right side was emergently bronched at bedside with extensive mucous plugging removed.  Patient was loaded with Keppra he was treated as a severe COPD exacerbation with IV steroids antibiotics and inhalers. In addition to his respiratory failure he also had acute kidney injury neurology was consulted upon arrival to the ED.   Assessment/Plan:   Status epilepticus ([place]) Initially intubated now extubated. He stated he has been compliant with his medication. Keppra was resumed EEG showed no signs of seizures. Neurology recommended Keppra 500 mg twice a day and Depakote 500 mg daily. Physical therapy evaluated the patient will need home health PT. Depakote level upon discharge in 5 days.  Acute respiratory failure with hypoxia in the setting of acute mucous plugging suspect aspiration pneumonia: CT showed pneumomediastinum and pulmonary nodules was successfully extubated on 08/24/2021. He will complete a 5-day course of antibiotics in house. Continue Mucomyst and albuterol. Continue chest physiotherapy Continue inhalers.  Chronic atrial fibrillation on Coumadin: Rate controlled INR therapeutic continue coumadin. Coumadin per pharmacy. Continue Coreg.  Chronic kidney disease stage III AA/hypokalemia/hypomagnesemia/hypophosphatemia: Acute kidney injury has been  ruled out. Phosphorus is low, replete orally recheck in the morning.  Newly diagnosed essential hypertension: Start low-dose Norvasc blood pressure continues to be elevated. Start low-dose lisinopril.   DVT prophylaxis: lovenox Family Communication:none Status is: Inpatient Remains inpatient appropriate because: Still with electrolyte imbalance.    Code Status:     Code Status Orders  (From admission, onward)           Start     Ordered   08/21/21 1748  Full code  Continuous        08/21/21 1750           Code Status History     Date Active Date Inactive Code Status Order ID Comments User Context   08/17/2016 0351 08/19/2016 1955 Full Code [id number]  Omar Person, NP Inpatient         IV Access:   Peripheral IV   Procedures and diagnostic studies:   No results found.   Medical Consultants:   None.   Subjective:    Nathan Shepherd no complaints, sleeping this morning when I went to the room.  Objective:    Vitals:   08/27/21 0017 08/27/21 0409 08/27/21 0812 08/27/21 0857  BP: (!) 126/93 (!) 142/98 (!) 156/104   Pulse: 70 68 72 83  Resp: 16 18 18 18   Temp: 98 F (36.7 C) 97.9 F (36.6 C) 99.6 F (37.6 C)   TempSrc: Oral Oral Oral   SpO2: 100% 100% 100% 99%  Height:       SpO2: 99 % O2 Flow Rate (L/min): 2 L/min FiO2 (%): 40 %   Intake/Output Summary (Last 24 hours) at 08/27/2021 0936 Last data filed at 08/27/2021 0636 Gross per 24 hour  Intake 1080 ml  Output  1000 ml  Net 80 ml    There were no vitals filed for this visit.  Exam: General exam: In no acute distress. Respiratory system: Good air movement and clear to auscultation. Cardiovascular system: S1 & S2 heard, RRR. No JVD. Gastrointestinal system: Abdomen is nondistended, soft and nontender.  Extremities: No pedal edema. Skin: No rashes, lesions or ulcers Psychiatry: Judgement and insight appear normal. Mood & affect appropriate.   Data Reviewed:     Labs: Basic Metabolic Panel: Recent Labs  Lab 08/22/21 0347 08/22/21 1935 08/23/21 0656 08/24/21 0240 08/25/21 0852 08/26/21 0314 08/27/21 0642  NA 137  --   --  140 141 142 140  K 3.3*  --   --  3.5 3.5 3.6 3.7  CL 105  --   --  110 110 109 113*  CO2 19*  --   --  25 27 27 29   GLUCOSE 134*  --   --  98 144* 96 91  BUN 11  --   --  16 15 15 12   CREATININE 1.43*  --   --  1.40* 1.48* 1.35* 1.29*  CALCIUM 9.1  --   --  8.1* 8.7* 8.6* 8.4*  MG 1.7 2.4 2.0 2.0 2.0 1.9  --   PHOS <1.0* 2.6 1.9* 4.8* 2.0* 3.1 2.3*    GFR Estimated Creatinine Clearance: 70.1 mL/min (A) (by C-G formula based on SCr of 1.29 mg/dL (H)). Liver Function Tests: Recent Labs  Lab 08/21/21 0812 08/26/21 0314 08/27/21 0642  AST 22  --   --   ALT 14  --   --   ALKPHOS 77  --   --   BILITOT 0.5  --   --   PROT 7.1  --   --   ALBUMIN 3.8 2.4* 2.6*    No results for input(s): "LIPASE", "AMYLASE" in the last 168 hours. No results for input(s): "AMMONIA" in the last 168 hours. Coagulation profile Recent Labs  Lab 08/22/21 0347 08/23/21 0656 08/24/21 0240 08/25/21 0852 08/27/21 0642  INR 1.7* 2.1* 2.0* 2.1* 3.0*    COVID-19 Labs  No results for input(s): "DDIMER", "FERRITIN", "LDH", "CRP" in the last 72 hours.  Lab Results  Component Value Date   SARSCOV2NAA NEGATIVE 08/21/2021    CBC: Recent Labs  Lab 08/21/21 0812 08/21/21 1821 08/22/21 0347 08/24/21 0240 08/25/21 0852 08/26/21 0314  WBC 5.2  --  14.3* 6.1 5.7 5.0  NEUTROABS 4.2  --   --  4.3 4.0 2.9  HGB 11.4* 12.9* 12.7* 10.7* 11.0* 9.8*  HCT 36.7* 38.0* 38.9* 33.7* 34.4* 31.0*  MCV 81.6  --  80.5 80.6 80.8 81.4  PLT 222  --  206 184 178 174    Cardiac Enzymes: No results for input(s): "CKTOTAL", "CKMB", "CKMBINDEX", "TROPONINI" in the last 168 hours. BNP (last 3 results) No results for input(s): "PROBNP" in the last 8760 hours. CBG: Recent Labs  Lab 08/23/21 1557 08/23/21 2008 08/24/21 0013 08/24/21 0348  08/24/21 0809  GLUCAP 95 111* 117* 93 86    D-Dimer: No results for input(s): "DDIMER" in the last 72 hours. Hgb A1c: No results for input(s): "HGBA1C" in the last 72 hours. Lipid Profile: No results for input(s): "CHOL", "HDL", "LDLCALC", "TRIG", "CHOLHDL", "LDLDIRECT" in the last 72 hours. Thyroid function studies: No results for input(s): "TSH", "T4TOTAL", "T3FREE", "THYROIDAB" in the last 72 hours.  Invalid input(s): "FREET3" Anemia work up: No results for input(s): "VITAMINB12", "FOLATE", "FERRITIN", "TIBC", "IRON", "RETICCTPCT" in the last 72  hours. Sepsis Labs: Recent Labs  Lab 08/21/21 0812 08/21/21 1019 08/22/21 0347 08/24/21 0240 08/25/21 0852 08/26/21 0314  WBC 5.2  --  14.3* 6.1 5.7 5.0  LATICACIDVEN 3.7* 3.2*  --   --   --   --     Microbiology Recent Results (from the past 240 hour(s))  SARS Coronavirus 2 by RT PCR (hospital order, performed in [facility] hospital lab) *cepheid single result test* Anterior Nasal Swab     Status: None   Collection Time: 08/21/21  8:43 AM   Specimen: Anterior Nasal Swab  Result Value Ref Range Status   SARS Coronavirus 2 by RT PCR NEGATIVE NEGATIVE Final    Comment: (NOTE) SARS-CoV-2 target nucleic acids are NOT DETECTED.  The SARS-CoV-2 RNA is generally detectable in upper and lower respiratory specimens during the acute phase of infection. The lowest concentration of SARS-CoV-2 viral copies this assay can detect is 250 copies / mL. A negative result does not preclude SARS-CoV-2 infection and should not be used as the sole basis for treatment or other patient management decisions.  A negative result may occur with improper specimen collection / handling, submission of specimen other than nasopharyngeal swab, presence of viral mutation(s) within the areas targeted by this assay, and inadequate number of viral copies (<250 copies / mL). A negative result must be combined with clinical observations, patient history, and  epidemiological information.  Fact Sheet for Patients:   RoadLapTop.co.za  Fact Sheet for Healthcare Providers: http://kim-miller.com/  This test is not yet approved or  cleared by the [place] FDA and has been authorized for detection and/or diagnosis of SARS-CoV-2 by FDA under an Emergency Use Authorization (EUA).  This EUA will remain in effect (meaning this test can be used) for the duration of the COVID-19 declaration under Section 564(b)(1) of the Act, 21 U.S.C. section 360bbb-3(b)(1), unless the authorization is terminated or revoked sooner.  Performed at [facility], [address] [place], [place] [address]   Blood culture (routine x 2)     Status: None   Collection Time: 08/21/21 10:15 AM   Specimen: BLOOD  Result Value Ref Range Status   Specimen Description BLOOD BLOOD LEFT WRIST  Final   Special Requests   Final    BOTTLES DRAWN AEROBIC AND ANAEROBIC Blood Culture adequate volume   Culture   Final    NO GROWTH 5 DAYS Performed at [facility], [address], [place], [place] [address]    Report Status 08/26/2021 FINAL  Final  Blood culture (routine x 2)     Status: None   Collection Time: 08/21/21 10:19 AM   Specimen: BLOOD  Result Value Ref Range Status   Specimen Description BLOOD BLOOD LEFT FOREARM  Final   Special Requests   Final    BOTTLES DRAWN AEROBIC AND ANAEROBIC Blood Culture results may not be optimal due to an inadequate volume of blood received in culture bottles   Culture   Final    NO GROWTH 5 DAYS Performed at [facility] [place] [facility], [address], [place], [place] [address]    Report Status 08/26/2021 FINAL  Final  Culture, Respiratory w Gram Stain     Status: None   Collection Time: 08/23/21  7:50 AM   Specimen: Tracheal Aspirate; Respiratory  Result Value Ref Range Status   Specimen Description TRACHEAL ASPIRATE  Final   Special Requests NONE  Final   Gram Stain    Final    ABUNDANT WBC PRESENT,  PREDOMINANTLY PMN FEW GRAM NEGATIVE RODS RARE GRAM POSITIVE COCCI IN PAIRS Performed at [facility], [place] [address], [place], [place] [address]    Culture MODERATE STAPHYLOCOCCUS AUREUS  Final   Report Status 08/25/2021 FINAL  Final   Organism ID, Bacteria STAPHYLOCOCCUS AUREUS  Final      Susceptibility   Staphylococcus aureus - MIC*    CIPROFLOXACIN <=0.5 SENSITIVE Sensitive     ERYTHROMYCIN >=8 RESISTANT Resistant     GENTAMICIN <=0.5 SENSITIVE Sensitive     OXACILLIN <=0.25 SENSITIVE Sensitive     TETRACYCLINE <=1 SENSITIVE Sensitive     VANCOMYCIN 1 SENSITIVE Sensitive     TRIMETH/SULFA <=10 SENSITIVE Sensitive     CLINDAMYCIN <=0.25 SENSITIVE Sensitive     RIFAMPIN <=0.5 SENSITIVE Sensitive     Inducible Clindamycin NEGATIVE Sensitive     * MODERATE STAPHYLOCOCCUS AUREUS     Medications:    amLODipine  2.5 mg Oral Daily   arformoterol  15 mcg Nebulization BID   aspirin  81 mg Oral Daily   atorvastatin  80 mg Oral Daily   budesonide (PULMICORT) nebulizer solution  0.5 mg Nebulization BID   divalproex  500 mg Oral Q12H   levETIRAcetam  1,500 mg Oral Q12H   pantoprazole  40 mg Oral Daily   warfarin  7.5 mg Oral q1600   Warfarin - Pharmacist Dosing Inpatient   Does not apply q1600   Continuous Infusions:  piperacillin-tazobactam (ZOSYN)  IV 3.375 g (08/27/21 0636)      LOS: 6 days   Charlynne Cousins  [facility]  08/27/2021, 9:36 AM

## 2021-08-28 DIAGNOSIS — J9601 Acute respiratory failure with hypoxia: Secondary | ICD-10-CM | POA: Diagnosis not present

## 2021-08-28 DIAGNOSIS — I482 Chronic atrial fibrillation, unspecified: Secondary | ICD-10-CM | POA: Diagnosis not present

## 2021-08-28 DIAGNOSIS — N1831 Chronic kidney disease, stage 3a: Secondary | ICD-10-CM

## 2021-08-28 DIAGNOSIS — N179 Acute kidney failure, unspecified: Secondary | ICD-10-CM | POA: Diagnosis not present

## 2021-08-28 DIAGNOSIS — G40901 Epilepsy, unspecified, not intractable, with status epilepticus: Secondary | ICD-10-CM | POA: Diagnosis not present

## 2021-08-28 LAB — CBC
HCT: 30.5 % — ABNORMAL LOW (ref 39.0–52.0)
Hemoglobin: 10 g/dL — ABNORMAL LOW (ref 13.0–17.0)
MCH: 26.6 pg (ref 26.0–34.0)
MCHC: 32.8 g/dL (ref 30.0–36.0)
MCV: 81.1 fL (ref 80.0–100.0)
Platelets: 168 10*3/uL (ref 150–400)
RBC: 3.76 MIL/uL — ABNORMAL LOW (ref 4.22–5.81)
RDW: 15.4 % (ref 11.5–15.5)
WBC: 5 10*3/uL (ref 4.0–10.5)
nRBC: 0 % (ref 0.0–0.2)

## 2021-08-28 LAB — RENAL FUNCTION PANEL
Albumin: 2.4 g/dL — ABNORMAL LOW (ref 3.5–5.0)
Anion gap: 6 (ref 5–15)
BUN: 12 mg/dL (ref 8–23)
CO2: 24 mmol/L (ref 22–32)
Calcium: 8.7 mg/dL — ABNORMAL LOW (ref 8.9–10.3)
Chloride: 112 mmol/L — ABNORMAL HIGH (ref 98–111)
Creatinine, Ser: 1.12 mg/dL (ref 0.61–1.24)
GFR, Estimated: >60 mL/min (ref >60)
Glucose, Bld: 96 mg/dL (ref 70–99)
Phosphorus: 2.5 mg/dL (ref 2.5–4.6)
Potassium: 3.8 mmol/L (ref 3.5–5.1)
Sodium: 142 mmol/L (ref 135–145)

## 2021-08-28 LAB — PROTIME-INR
INR: 2.9 — ABNORMAL HIGH (ref 0.8–1.2)
Prothrombin Time: 29.9 seconds — ABNORMAL HIGH (ref 11.4–15.2)

## 2021-08-28 MED ORDER — AMLODIPINE BESYLATE 2.5 MG PO TABS: 2.5000 mg | ORAL_TABLET | Freq: Every day | ORAL | 0 refills | Status: DC

## 2021-08-28 MED ORDER — CARVEDILOL 12.5 MG PO TABS: 12.5000 mg | ORAL_TABLET | Freq: Two times a day (BID) | ORAL | Status: DC

## 2021-08-28 MED ORDER — DIVALPROEX SODIUM 500 MG PO DR TAB: 500.0000 mg | DELAYED_RELEASE_TABLET | Freq: Two times a day (BID) | ORAL | 0 refills | Status: DC

## 2021-08-28 MED ORDER — NIFEDIPINE ER OSMOTIC RELEASE 60 MG PO TB24
60.0000 mg | ORAL_TABLET | Freq: Every day | ORAL | Status: DC
  Filled 2021-08-28: qty 1

## 2021-08-28 MED ORDER — LOSARTAN POTASSIUM 50 MG PO TABS
50.0000 mg | ORAL_TABLET | Freq: Every day | ORAL | Status: DC
  Administered 2021-08-28: 50 mg via ORAL
  Filled 2021-08-28: qty 1

## 2021-08-28 NOTE — Progress Notes (Signed)
ANTICOAGULATION CONSULT NOTE - Follow up   Pharmacy Consult for warfarin Indication: atrial fibrillation  Allergies  Allergen Reactions   Penicillins Hives    Tolerated Zosyn    Patient Measurements: Height: 6\' 3"  (190.5 cm) IBW/kg (Calculated) : 84.5  Vital Signs: Temp: 98 F (36.7 C) (08/26 0742) Temp Source: Oral (08/26 0742) BP: 182/111 (08/26 0742) Pulse Rate: 84 (08/26 0742)  Labs: Recent Labs    08/26/21 0314 08/27/21 0642 08/28/21 0339  HGB 9.8*  --  10.0*  HCT 31.0*  --  30.5*  PLT 174  --  168  LABPROT  --  30.6* 29.9*  INR  --  3.0* 2.9*  CREATININE 1.35* 1.29* 1.12     Estimated Creatinine Clearance: 80.7 mL/min (by C-G formula based on SCr of 1.12 mg/dL).   Medical History: Past Medical History:  Diagnosis Date   CHF (congestive heart failure) (HCC)    CKD (chronic kidney disease) stage 3, GFR 30-59 ml/min (HCC)    Edema    Hyperlipemia    Hypertension    Seizures (HCC)     Medications:  Medications Prior to Admission  Medication Sig Dispense Refill Last Dose   acetaminophen (TYLENOL) 325 MG tablet Take 650 mg by mouth every 6 (six) hours as needed for mild pain.   unk   aspirin EC 81 MG tablet Take 81 mg by mouth daily.   8.18.2023   budesonide-formoterol (SYMBICORT) 160-4.5 MCG/ACT inhaler Inhale 2 puffs into the lungs in the morning and at bedtime.   8.18.2023   carvedilol (COREG) 12.5 MG tablet Take 12.5 mg by mouth 2 (two) times daily with a meal.   8.18.2023 at 0800   donepezil (ARICEPT) 5 MG tablet Take 5 mg by mouth at bedtime.   8.17.2023   levETIRAcetam (KEPPRA) 1000 MG tablet Take 1,000 mg by mouth 2 (two) times daily.   8.18.2023   losartan (COZAAR) 50 MG tablet Take 50 mg by mouth daily.   8.18.2023   NIFEdipine (PROCARDIA XL/NIFEDICAL XL) 60 MG 24 hr tablet Take 60 mg by mouth daily.   8.18.2023   rosuvastatin (CRESTOR) 20 MG tablet Take 20 mg by mouth at bedtime.    8.17.2023   Skin Protectants, Misc. (EUCERIN) cream Apply 1  application  topically daily. Apply to legs and feet once daily   8.18.2023   torsemide (DEMADEX) 20 MG tablet Take 20 mg by mouth daily.   8.18.2023   Vitamin D, Ergocalciferol, (DRISDOL) 1.25 MG (50000 UNIT) CAPS capsule Take 50,000 Units by mouth every 7 (seven) days. Thursday's   8.17.2023   warfarin (COUMADIN) 7.5 MG tablet Take 7.5 mg by mouth daily.   8.17.2023 at 1700   Scheduled:   amLODipine  2.5 mg Oral Daily   arformoterol  15 mcg Nebulization BID   aspirin  81 mg Oral Daily   atorvastatin  80 mg Oral Daily   budesonide (PULMICORT) nebulizer solution  0.5 mg Nebulization BID   divalproex  500 mg Oral Q12H   levETIRAcetam  1,500 mg Oral Q12H   lisinopril  5 mg Oral Daily   pantoprazole  40 mg Oral Daily   warfarin  7.5 mg Oral q1600   Warfarin - Pharmacist Dosing Inpatient   Does not apply q1600    Assessment: 66 YO male transferred from [facility] 8/19 requiring intubation and continuous EEG monitoring following status epilepticus. The patient has a history of chronic afib. Based on facility [facility], patient has been on warfarin 7.5mg  once daily.  INR is 2.9, currently therapeutic today, on warfarin 7.5 mg daily. This dose is per facility [facility] regimen, taking PTA. Since INR has been at goal consistently and patient is on a stable regimen, INR will be monitored less frequently, every MWF. Hgb 11.0 trended down recently but is now stable and pltc wnl/ stable on 08/28/21.  No bleeding noted.   Goal of Therapy:  INR 2-3 Monitor platelets by anticoagulation protocol: Yes   Plan:  Give warfarin 7.5mg  daily, per home dosing regimen.  Monitor INR every MWF  Monitor CBC, signs/symptoms of bleeding   Alesia Banda, Pharmacy Intern

## 2021-08-28 NOTE — Progress Notes (Signed)
Discharge paperwork printed and reviewed with patient.  Peripheral iv has been removed.  Patient has no questions at this time.

## 2021-08-28 NOTE — TOC Transition Note (Signed)
Transition of Care [facility]) - CM/SW Discharge Note   Patient Details  Name: Nathan Shepherd MRN: [id number] Date of Birth: April 22, 1955  [facility] CM/SW Contact:  Lawerance Sabal, RN Phone Number: 08/28/2021, 9:44 AM   Clinical Narrative:    [facility] HH of DC planned for [place]. RW to be delivered to the room prior to DC.  Patient asked for clothes to be brought from [facility]. Notified CSW of request and CSW to call [facility] and set up transportation. No other TOC needs identified for DC    Final next level of care: Home w Home Health Services Barriers to Discharge: No Barriers Identified   Patient Goals and CMS Choice Patient states their goals for this hospitalization and ongoing recovery are:: return to group home CMS Medicare.gov Compare Post Acute Care list provided to:: Patient Represenative (must comment) Choice offered to / list presented to : [facility] POA / Guardian  Discharge Placement                       Discharge Plan and Services   Discharge Planning Services: CM Consult Post Acute Care Choice: Home Health          DME Arranged: Walker rolling DME Agency: [employer] Date DME Agency Contacted: 08/28/21 Time DME Agency Contacted: [phone] Representative spoke with at DME Agency: Leavy Cella HH Arranged: PT, OT HH Agency: [facility] Date [facility] Agency Contacted: 08/28/21 Time HH Agency Contacted: [phone] Representative spoke with at [facility] Agency: Kandee Keen  Social Determinants of Health (SDOH) Interventions     Readmission Risk Interventions     No data to display

## 2021-08-28 NOTE — Plan of Care (Signed)
  Problem: Activity: Goal: Ability to tolerate increased activity will improve Outcome: Progressing   Problem: Respiratory: Goal: Ability to maintain a clear airway and adequate ventilation will improve Outcome: Progressing   Problem: Role Relationship: Goal: Method of communication will improve Outcome: Progressing   Problem: Safety: Goal: Non-violent Restraint(s) Outcome: Progressing   Problem: Education: Goal: Knowledge of General Education information will improve Description: Including pain rating scale, medication(s)/side effects and non-pharmacologic comfort measures Outcome: Progressing   Problem: Health Behavior/Discharge Planning: Goal: Ability to manage health-related needs will improve Outcome: Progressing   Problem: Clinical Measurements: Goal: Ability to maintain clinical measurements within normal limits will improve Outcome: Progressing Goal: Will remain free from infection Outcome: Progressing Goal: Diagnostic test results will improve Outcome: Progressing Goal: Respiratory complications will improve Outcome: Progressing Goal: Cardiovascular complication will be avoided Outcome: Progressing   Problem: Activity: Goal: Risk for activity intolerance will decrease Outcome: Progressing   Problem: Nutrition: Goal: Adequate nutrition will be maintained Outcome: Progressing   Problem: Coping: Goal: Level of anxiety will decrease Outcome: Progressing   Problem: Elimination: Goal: Will not experience complications related to bowel motility Outcome: Progressing Goal: Will not experience complications related to urinary retention Outcome: Progressing   Problem: Pain Managment: Goal: General experience of comfort will improve Outcome: Progressing   Problem: Safety: Goal: Ability to remain free from injury will improve Outcome: Progressing   Problem: Skin Integrity: Goal: Risk for impaired skin integrity will decrease Outcome: Progressing

## 2021-08-28 NOTE — Discharge Summary (Signed)
Physician Discharge Summary  Nathan Shepherd OVZ:[id number] DOB: 07-28-1955 DOA: 08/21/2021  PCP: Rush Farmer, NP  Admit date: 08/21/2021 Discharge date: 08/28/2021  Admitted From: Home Disposition:  group home  Recommendations for Outpatient Follow-up:  Follow up with PCP in 1-2 weeks Please obtain BMP/CBC in one week  Home Health:no Equipment/Devices:None  Discharge Condition:Stable CODE STATUS:Full Diet recommendation: Heart Healthy  Brief/Interim Summary:  66 y.o. male past medical history of seizures, essential hypertension chronic kidney disease stage III who presented to [facility] on 08/21/2021 for status epilepticus as per chart was found in the group home unresponsive EMS found him to be hypoxic and actively seizing with a right gaze was given 7.5 of Versed in route but he continued to seize intubated in the ED started on mechanical ventilation chest x-ray showed mucous plugging on the right side was emergently bronched at bedside with extensive mucous plugging removed.  Patient was loaded with Keppra he was treated as a severe COPD exacerbation with IV steroids antibiotics and inhalers. In addition to his respiratory failure he also had acute kidney injury neurology was consulted upon arrival to the ED.  Discharge Diagnoses:  Principal Problem:   Status epilepticus (HCC) Active Problems:   Hypokalemia   Acute respiratory failure with hypoxia (HCC)   Chronic atrial fibrillation with RVR (HCC)   Aspiration pneumonia (HCC)   AKI (acute kidney injury) (HCC)   Chronic kidney disease, stage III (moderate) (HCC)   Hypophosphatemia   Hypomagnesemia  Status epilepticus: Initially intubated now extubated. He was started on Keppra, EEG showed no signs of seizures. Neurology was consulted and added Depakote twice a day. Physical therapy evaluated the patient and recommended home health PT.  Acute respiratory failure with hypoxia in the setting of acute mucous plugging  suspect aspiration pneumonia: CT showed pneumomediastinum with pulmonary nodule he was started empirically on IV antibiotics to complete a 5-day course in-house. He was successfully extubated on 08/24/2021. Started on Mucomyst and albuterol and chest physiotherapy saturations have remained greater than 90% on room air.  Chronic atrial fibrillation on Coumadin: Rate controlled continue current regimen INR therapeutic.  Chronic kidney disease stage III: Acute kidney injury has been ruled out electrolytes were repleted as he was hypokalemic hypomagnesemic and hypophosphatemic This was repleted orally and were monitored and remained stable-  Essential hypertension: He was started back on his home regimen no changes made follow-up as an outpatient titrate as tolerated.  Discharge Instructions  Discharge Instructions     Diet - low sodium heart healthy   Complete by: As directed    Increase activity slowly   Complete by: As directed       Allergies as of 08/28/2021       Reactions   Penicillins Hives   Tolerated Zosyn        Medication List     TAKE these medications    acetaminophen 325 MG tablet Commonly known as: TYLENOL Take 650 mg by mouth every 6 (six) hours as needed for mild pain.   amLODipine 2.5 MG tablet Commonly known as: NORVASC Take 1 tablet (2.5 mg total) by mouth daily.   aspirin EC 81 MG tablet Take 81 mg by mouth daily.   budesonide-formoterol 160-4.5 MCG/ACT inhaler Commonly known as: SYMBICORT Inhale 2 puffs into the lungs in the morning and at bedtime.   carvedilol 12.5 MG tablet Commonly known as: COREG Take 12.5 mg by mouth 2 (two) times daily with a meal.   divalproex 500 MG DR tablet Commonly  known as: DEPAKOTE Take 1 tablet (500 mg total) by mouth every 12 (twelve) hours.   donepezil 5 MG tablet Commonly known as: ARICEPT Take 5 mg by mouth at bedtime.   eucerin cream Apply 1 application  topically daily. Apply to legs and feet  once daily   levETIRAcetam 1000 MG tablet Commonly known as: KEPPRA Take 1,000 mg by mouth 2 (two) times daily.   losartan 50 MG tablet Commonly known as: COZAAR Take 50 mg by mouth daily.   NIFEdipine 60 MG 24 hr tablet Commonly known as: PROCARDIA XL/NIFEDICAL XL Take 60 mg by mouth daily.   rosuvastatin 20 MG tablet Commonly known as: CRESTOR Take 20 mg by mouth at bedtime.   torsemide 20 MG tablet Commonly known as: DEMADEX Take 20 mg by mouth daily.   Vitamin D (Ergocalciferol) 1.25 MG (50000 UNIT) Caps capsule Commonly known as: DRISDOL Take 50,000 Units by mouth every 7 (seven) days. Thursday's   warfarin 7.5 MG tablet Commonly known as: COUMADIN Take 7.5 mg by mouth daily.               Durable Medical Equipment  (From admission, onward)           Start     Ordered   08/26/21 1219  For home use only DME Walker rolling  Once       Question Answer Comment  Walker: With 5 Inch Wheels   Patient needs a walker to treat with the following condition Difficulty walking      08/26/21 1218            Follow-up Information     [facility], [facility] Follow up.   Specialty: Home Health Services Why: The home health agency will contact you for the first home visit Contact information: [address] [place] [address] [phone]                Allergies  Allergen Reactions   Penicillins Hives    Tolerated Zosyn    Consultations: Pulmonary and critical care   Procedures/Studies: DG Chest Port 1 View  Result Date: 08/22/2021 CLINICAL DATA:  Intubated.  Assess ET tube placement. EXAM: PORTABLE CHEST 1 VIEW COMPARISON:  08/21/2021 FINDINGS: There is an endotracheal tube in place with tip approximately 7.2 cm above the carina. Enteric tube is identified with tip and side port below the GE junction. Stable cardiomediastinal contours. Right midlung and right base opacities are unchanged from previous exam. Left lung  appears clear. IMPRESSION: 1. Stable support apparatus. 2. No change in right lung opacities compared with previous exam. Electronically Signed   By: Signa Kell M.D.   On: 08/22/2021 09:31   Overnight EEG with video  Result Date: 08/22/2021 Charlsie Quest, MD     08/23/2021  8:36 AM Patient Name: Nathan Shepherd MRN: [id number] Epilepsy Attending: Charlsie Quest Referring Physician/Provider: Erick Blinks, MD  Duration: 08/21/2021 1957 to 08/22/2021 1957 Patient history: 66 y.o. male with PMH significant for CHF, CKD3, HTN, HLD, seizures on Keppra 1500 BID in the setting of L occipital stroke, afibb on warfarin with INR of 1.7 who was found unresponsive and presented to [facility] in status epilepticus. EEG to evaluate for seizure. Level of alertness: lethargic AEDs during EEG study: LEV, VPA, propofol Technical aspects: This EEG study was done with scalp electrodes positioned according to the 10-20 International system of electrode placement. Electrical activity was reviewed with band pass filter of 1-70Hz , sensitivity of 7  uV/mm, display speed of 34mm/sec with a 60Hz  notched filter applied as appropriate. EEG data were recorded continuously and digitally stored.  Video monitoring was available and reviewed as appropriate. Description: At the beginning of study, EEG showed continuous generalized and lateralized left hemisphere 2-3hz  delta slowing admixed with 15-18Hz  generalized beta activity. Gradually, EEG showed continuous generalized and lateralized left hemisphere 3-7hz  theta-delta slowing. Abundant sharp waves were noted in left temporal region. Hyperventilation and photic stimulation were not performed.   ABNORMALITY - Sharp wave, left temporal region - Continuous slow, generalized and lateralized left hemisphere IMPRESSION: This study showed evidence of epileptogenicity arising from left temporal region. There is also cortical dysfunction in left hemisphere likely secondary to underlying  structural abnormality, post-ictal state. Additionally there is moderate to severe diffuse encephalopathy, nonspecific etiology. No seizures were seen throughout the recording. Lora Havens   EEG adult  Result Date: 08/22/2021 Lora Havens, MD     08/22/2021  9:14 AM Patient Name: Nathan Shepherd MRN: [id number] Epilepsy Attending: Lora Havens Referring Physician/Provider: Donnetta Simpers, MD  Date: 08/21/2021 Duration: 21.08 mins  Patient history: 66 y.o. male with PMH significant for CHF, CKD3, HTN, HLD, seizures on Keppra 1500 BID in the setting of L occipital stroke, afibb on warfarin with INR of 1.7 who was found unresponsive and presented to [facility] in status epilepticus. EEG to evaluate for seizure.  Level of alertness: comatose  AEDs during EEG study: LEV, propofol  Technical aspects: This EEG study was done with scalp electrodes positioned according to the 10-20 International system of electrode placement. Electrical activity was reviewed with band pass filter of 1-70Hz , sensitivity of 7 uV/mm, display speed of 51mm/sec with a 60Hz  notched filter applied as appropriate. EEG data were recorded continuously and digitally stored.  Video monitoring was available and reviewed as appropriate.  Description: EEG showed continuous generalized and lateralized left hemisphere 2-3hz  delta slowing admixed with 15-18Hz  generalized beta activity.Sharp waves were noted in left temporal region. Hyperventilation and photic stimulation were not performed.    ABNORMALITY - Sharp wave, left temporal region - Continuous slow, generalized and lateralized left hemisphere  IMPRESSION: This study showed evidence of epileptogenicity arising from left temporal region. There is also cortical dysfunction in left hemisphere likely secondary to underlying structural abnormality, post-ictal state. Additionally there is severe diffuse encephalopathy, nonspecific etiology. No seizures were seen throughout the recording.   Lora Havens   MR BRAIN WO CONTRAST  Result Date: 08/22/2021 CLINICAL DATA:  Seizure EXAM: MRI HEAD WITHOUT CONTRAST TECHNIQUE: Multiplanar, multiecho pulse sequences of the brain and surrounding structures were obtained without intravenous contrast. COMPARISON:  08/17/2016 MRI head, 08/21/2021 CT head FINDINGS: Brain: No restricted diffusion to suggest acute or subacute infarct. No acute hemorrhage, mass, mass effect, or midline shift. Redemonstrated remote left PCA territory infarct with encephalomalacia in the left occipital lobe. Redemonstrated left thalamic and bilateral corona radiata lacunar infarcts. T2 hyperintense signal in the periventricular white matter, likely the sequela of moderate chronic small vessel ischemic disease. Mildly increased T2 hyperintense signal in the left hippocampus compared to the right (series 21, image 14), with decreased size of the left hippocampus, some of which may be related to the previously described left PCA territory infarct. This is overall similar to 08/17/2016. No heterotopia or evidence of cortical dysgenesis. Vascular: Normal flow voids. Skull and upper cervical spine: Normal marrow signal. Sinuses/Orbits: Inspissated mucus in the frontal sinuses, anterior ethmoid air cells, and right maxillary sinus with additional mucosal thickening  in the remainder of the ethmoid air cells, overall similar to prior. The orbits are unremarkable. Other: Trace fluid in the mastoid air cells. IMPRESSION: 1. 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 2. No acute intracranial process. No evidence of acute or subacute infarct. Electronically Signed   By: Merilyn Baba M.D.   On: 08/22/2021 03:33   DG Abd 1 View  Result Date: 08/21/2021 CLINICAL DATA:  MRI clearance, NG tube placement EXAM: ABDOMEN - 1 VIEW  COMPARISON:  None FINDINGS: NG tube tip is in the proximal stomach with the side port in the distal esophagus. Presumed rectal temperature probe in place in the lower pelvis. Recommend clinical correlation. Otherwise no metallic foreign bodies. IMPRESSION: NG tube in the proximal stomach. Presumed rectal temperature probe in place. Otherwise no radiopaque foreign bodies. Electronically Signed   By: Rolm Baptise M.D.   On: 08/21/2021 20:18   DG Chest Port 1 View  Result Date: 08/21/2021 CLINICAL DATA:  Intubated EXAM: PORTABLE CHEST 1 VIEW COMPARISON:  08/21/2021 FINDINGS: Endotracheal tube is 7 cm above the carina. NG tube is in the stomach. Mild cardiomegaly. Patchy airspace opacities in the right lung. Left lung clear. No effusions or acute bony abnormality. Pneumomediastinum noted as seen on earlier CT. No visible pneumothorax. IMPRESSION: Endotracheal tube 7 cm above the carina. Patchy airspace opacities throughout the right lung. Pneumomediastinum. Electronically Signed   By: Rolm Baptise M.D.   On: 08/21/2021 18:10   CT Chest Wo Contrast  Result Date: 08/21/2021 CLINICAL DATA:  Code.  Aspiration. EXAM: CT CHEST WITHOUT CONTRAST TECHNIQUE: Multidetector CT imaging of the chest was performed following the standard protocol without IV contrast. RADIATION DOSE REDUCTION: This exam was performed according to the departmental dose-optimization program which includes automated exposure control, adjustment of the mA and/or kV according to patient size and/or use of iterative reconstruction technique. COMPARISON:  Multiple chest radiographs, including earlier today. FINDINGS: Cardiovascular: Multifactorial degradation, including mild motion and patient arm position, not raised above the head. Ascending aortic dilatation at 4.1 cm. Aortic atherosclerosis. Moderate cardiomegaly with multivessel coronary artery calcification. Pulmonary artery enlargement, including a 3.3 cm right pulmonary artery.  Mediastinum/Nodes: Extensive pneumomediastinum with air tracking into the neck. No mediastinal or definite hilar adenopathy, given limitations of unenhanced CT. Nasogastric tube terminating in the proximal stomach. Lungs/Pleura: Small volume right pleural fluid. Endotracheal tube terminates well above the carina. Trace right-sided pneumothorax, primarily anteriorly and inferiorly. Relatively diffuse right-sided airspace and ground-glass opacity, most significant in the right middle lobe. Some areas are more nodular. Separate right upper lobe 4 mm nodule on 59/3 and left lower lobe 4 mm nodule on 135/3. Right-sided bronchial wall thickening with endobronchial material. Upper Abdomen: Normal imaged portions of the liver, spleen, pancreas, adrenal glands. Punctate upper pole left renal collecting system calculus. Musculoskeletal: No acute osseous abnormality. IMPRESSION: 1. Multifactorial degradation, including motion and patient arm position, not raised above the head. 2. Extensive pneumomediastinum with trace right pleural air. These results were called by telephone at the time of interpretation on 08/21/2021 at 10:33 am to provider Duffy Bruce , who verbally acknowledged these results. 3. Diffuse right-sided airspace and ground-glass opacity, consistent with the clinical history of aspiration. 4. Bilateral pulmonary nodules are most likely benign but technically indeterminate. No follow-up needed if patient is low-risk. Non-contrast chest CT can be considered  in 12 months if patient is high-risk, nodule is upper lobe, and/or suspicious in morphology. This recommendation follows the consensus statement: Guidelines for Management of Incidental Pulmonary Nodules Detected on CT Images: From the Fleischner Society 2017; Radiology 2017; 284:228-243. 5. Small right pleural effusion 6. Coronary artery atherosclerosis. Aortic Atherosclerosis (ICD10-I70.0). 7. Left nephrolithiasis 8. Pulmonary artery enlargement suggests  pulmonary arterial hypertension. 9. Mild ascending aortic dilatation at 4.1 cm. Recommend annual imaging followup by CTA or MRA. This recommendation follows 2010 ACCF/AHA/AATS/ACR/ASA/SCA/SCAI/SIR/STS/SVM Guidelines for the Diagnosis and Management of Patients with Thoracic Aortic Disease. Circulation. 2010; [id number]. Aortic aneurysm NOS (ICD10-I71.9) Electronically Signed   By: Abigail Miyamoto M.D.   On: 08/21/2021 10:33   CT HEAD WO CONTRAST (5MM)  Result Date: 08/21/2021 CLINICAL DATA:  Recurrent seizures. EXAM: CT HEAD WITHOUT CONTRAST TECHNIQUE: Contiguous axial images were obtained from the base of the skull through the vertex without intravenous contrast. RADIATION DOSE REDUCTION: This exam was performed according to the departmental dose-optimization program which includes automated exposure control, adjustment of the mA and/or kV according to patient size and/or use of iterative reconstruction technique. COMPARISON:  08/16/2016 FINDINGS: Brain: Age advanced cerebral atrophy. Moderate low density in the periventricular white matter likely related to small vessel disease. Felt to be slightly progressive. Left PCA remote infarct.  Remote left thalamic lacunar infarct. No mass lesion, hemorrhage, hydrocephalus, intra-axial, or extra-axial fluid collection. Hypoattenuation in the left cerebellar hemisphere on 12/02 is favored to be artifactual. Vascular: No hyperdense vessel or unexpected calcification. Skull: No significant soft tissue swelling.  No skull fracture. Sinuses/Orbits: Normal imaged portions of the orbits and globes. Extensive mucosal thickening of ethmoid air cells, right maxillary sinus, frontal sinuses. Clear mastoid air cells. Other: None. IMPRESSION: 1.  No acute intracranial abnormality. 2.  Cerebral atrophy and small vessel ischemic change. 3. Remote left PCA infarct and left thalamic lacunar infarct. Electronically Signed   By: Abigail Miyamoto M.D.   On: 08/21/2021 10:21   DG Chest  Port 1 View  Result Date: 08/21/2021 CLINICAL DATA:  Unresponsive, history of seizures EXAM: PORTABLE CHEST 1 VIEW COMPARISON:  08/21/2021 FINDINGS: Endotracheal tube with the tip 6.8 cm above the carina. Nasogastric tube coursing below the diaphragm. Left lung is clear. Right lung volume loss with patchy airspace disease. No pneumothorax. Stable cardiomediastinal silhouette. No acute osseous abnormality. IMPRESSION: 1. Endotracheal tube with the tip 6.8 cm above the carina. 2. Nasogastric tube coursing below the diaphragm. 3. Right lung volume loss with persistent patchy airspace disease which may reflect atelectasis versus pneumonia. Electronically Signed   By: Kathreen Devoid M.D.   On: 08/21/2021 09:20   DG Abd Portable 1 View  Result Date: 08/21/2021 CLINICAL DATA:  Intubation EXAM: PORTABLE ABDOMEN - 1 VIEW COMPARISON:  None Available. FINDINGS: Nasogastric tube with the tip projecting over the stomach, but the proximal port is above the esophagogastric junction. Recommend advancing the nasogastric tube 10 cm. No bowel dilatation to suggest obstruction. No evidence of pneumoperitoneum, portal venous gas or pneumatosis. Patchy right lung airspace disease. No acute osseous abnormality. IMPRESSION: 1. Nasogastric tube with the tip projecting over the stomach, but the proximal port is above the esophagogastric junction. Recommend advancing the nasogastric tube 10 cm. 2. Patchy right lung airspace disease as can be seen with pneumonia versus atelectasis. Electronically Signed   By: Kathreen Devoid M.D.   On: 08/21/2021 09:12   DG Chest Portable 1 View  Result Date: 08/21/2021 CLINICAL DATA:  Intubation EXAM: PORTABLE CHEST 1  VIEW COMPARISON:  12/15/2016 FINDINGS: Endotracheal tube with the tip 3.8 cm above the carina. Nasogastric tube coursing below the diaphragm. Right lung volume loss with patchy airspace disease most severe in the right upper lobe which may reflect atelectasis versus pneumonia. Costophrenic  angles are excluded from the field of view. No pneumothorax. Stable cardiomediastinal silhouette. No aggressive osseous lesion. IMPRESSION: 1. Endotracheal tube with the tip 3.8 cm above the carina. Nasogastric tube coursing below the diaphragm. 2. Right lung volume loss with patchy airspace disease most severe in the right upper lobe which may reflect atelectasis versus pneumonia. Electronically Signed   By: Kathreen Devoid M.D.   On: 08/21/2021 09:11   (Echo, Carotid, EGD, Colonoscopy, ERCP)    Subjective: No complaints  Discharge Exam: Vitals:   08/28/21 0400 08/28/21 0742  BP: (!) 155/100 (!) 182/111  Pulse: 79 84  Resp: 18 20  Temp: 98.9 F (37.2 C) 98 F (36.7 C)  SpO2: 95% 100%   Vitals:   08/27/21 2100 08/27/21 2247 08/28/21 0400 08/28/21 0742  BP: (!) 175/114 (!) 137/98 (!) 155/100 (!) 182/111  Pulse: 88 75 79 84  Resp: 20 18 18 20   Temp: 98.4 F (36.9 C)  98.9 F (37.2 C) 98 F (36.7 C)  TempSrc: Oral  Axillary Oral  SpO2: 100%  95% 100%  Height:        General: Pt is alert, awake, not in acute distress Cardiovascular: RRR, S1/S2 +, no rubs, no gallops Respiratory: CTA bilaterally, no wheezing, no rhonchi Abdominal: Soft, NT, ND, bowel sounds + Extremities: no edema, no cyanosis    The results of significant diagnostics from this hospitalization (including imaging, microbiology, ancillary and laboratory) are listed below for reference.     Microbiology: Recent Results (from the past 240 hour(s))  SARS Coronavirus 2 by RT PCR (hospital order, performed in [facility] hospital lab) *cepheid single result test* Anterior Nasal Swab     Status: None   Collection Time: 08/21/21  8:43 AM   Specimen: Anterior Nasal Swab  Result Value Ref Range Status   SARS Coronavirus 2 by RT PCR NEGATIVE NEGATIVE Final    Comment: (NOTE) SARS-CoV-2 target nucleic acids are NOT DETECTED.  The SARS-CoV-2 RNA is generally detectable in upper and lower respiratory specimens during  the acute phase of infection. The lowest concentration of SARS-CoV-2 viral copies this assay can detect is 250 copies / mL. A negative result does not preclude SARS-CoV-2 infection and should not be used as the sole basis for treatment or other patient management decisions.  A negative result may occur with improper specimen collection / handling, submission of specimen other than nasopharyngeal swab, presence of viral mutation(s) within the areas targeted by this assay, and inadequate number of viral copies (<250 copies / mL). A negative result must be combined with clinical observations, patient history, and epidemiological information.  Fact Sheet for Patients:   https://www.patel.info/  Fact Sheet for Healthcare Providers: https://hall.com/  This test is not yet approved or  cleared by the [place] FDA and has been authorized for detection and/or diagnosis of SARS-CoV-2 by FDA under an Emergency Use Authorization (EUA).  This EUA will remain in effect (meaning this test can be used) for the duration of the COVID-19 declaration under Section 564(b)(1) of the Act, 21 U.S.C. section 360bbb-3(b)(1), unless the authorization is terminated or revoked sooner.  Performed at [facility], [address], [place], [place] [address]   Blood culture (routine x 2)  Status: None   Collection Time: 08/21/21 10:15 AM   Specimen: BLOOD  Result Value Ref Range Status   Specimen Description BLOOD BLOOD LEFT WRIST  Final   Special Requests   Final    BOTTLES DRAWN AEROBIC AND ANAEROBIC Blood Culture adequate volume   Culture   Final    NO GROWTH 5 DAYS Performed at [facility], [address], [place], [place] [address]    Report Status 08/26/2021 FINAL  Final  Blood culture (routine x 2)     Status: None   Collection Time: 08/21/21 10:19 AM   Specimen: BLOOD  Result Value Ref Range Status   Specimen Description BLOOD  BLOOD LEFT FOREARM  Final   Special Requests   Final    BOTTLES DRAWN AEROBIC AND ANAEROBIC Blood Culture results may not be optimal due to an inadequate volume of blood received in culture bottles   Culture   Final    NO GROWTH 5 DAYS Performed at [facility], [address], [place], [place] [address]    Report Status 08/26/2021 FINAL  Final  Culture, Respiratory w Gram Stain     Status: None   Collection Time: 08/23/21  7:50 AM   Specimen: Tracheal Aspirate; Respiratory  Result Value Ref Range Status   Specimen Description TRACHEAL ASPIRATE  Final   Special Requests NONE  Final   Gram Stain   Final    ABUNDANT WBC PRESENT, PREDOMINANTLY PMN FEW GRAM NEGATIVE RODS RARE GRAM POSITIVE COCCI IN PAIRS Performed at [facility], [address], [place], [place] [address]    Culture MODERATE STAPHYLOCOCCUS AUREUS  Final   Report Status 08/25/2021 FINAL  Final   Organism ID, Bacteria STAPHYLOCOCCUS AUREUS  Final      Susceptibility   Staphylococcus aureus - MIC*    CIPROFLOXACIN <=0.5 SENSITIVE Sensitive     ERYTHROMYCIN >=8 RESISTANT Resistant     GENTAMICIN <=0.5 SENSITIVE Sensitive     OXACILLIN <=0.25 SENSITIVE Sensitive     TETRACYCLINE <=1 SENSITIVE Sensitive     VANCOMYCIN 1 SENSITIVE Sensitive     TRIMETH/SULFA <=10 SENSITIVE Sensitive     CLINDAMYCIN <=0.25 SENSITIVE Sensitive     RIFAMPIN <=0.5 SENSITIVE Sensitive     Inducible Clindamycin NEGATIVE Sensitive     * MODERATE STAPHYLOCOCCUS AUREUS     Labs: BNP (last 3 results) Recent Labs    08/21/21 0812  BNP [id number]*   Basic Metabolic Panel: Recent Labs  Lab 08/22/21 1935 08/23/21 0656 08/24/21 0240 08/25/21 0852 08/26/21 0314 08/27/21 0642 08/28/21 0339  NA  --   --  140 141 142 140 142  K  --   --  3.5 3.5 3.6 3.7 3.8  CL  --   --  110 110 109 113* 112*  CO2  --   --  25 27 27 29 24   GLUCOSE  --   --  98 144* 96 91 96  BUN  --   --  16 15 15 12 12   CREATININE  --   --  1.40* 1.48*  1.35* 1.29* 1.12  CALCIUM  --   --  8.1* 8.7* 8.6* 8.4* 8.7*  MG 2.4 2.0 2.0 2.0 1.9  --   --   PHOS 2.6 1.9* 4.8* 2.0* 3.1 2.3* 2.5   Liver Function Tests: Recent Labs  Lab 08/26/21 0314 08/27/21 0642 08/28/21 0339  ALBUMIN 2.4* 2.6* 2.4*   No results for input(s): "LIPASE", "AMYLASE" in the last 168  hours. No results for input(s): "AMMONIA" in the last 168 hours. CBC: Recent Labs  Lab 08/22/21 0347 08/24/21 0240 08/25/21 0852 08/26/21 0314 08/28/21 0339  WBC 14.3* 6.1 5.7 5.0 5.0  NEUTROABS  --  4.3 4.0 2.9  --   HGB 12.7* 10.7* 11.0* 9.8* 10.0*  HCT 38.9* 33.7* 34.4* 31.0* 30.5*  MCV 80.5 80.6 80.8 81.4 81.1  PLT 206 184 178 174 168   Cardiac Enzymes: No results for input(s): "CKTOTAL", "CKMB", "CKMBINDEX", "TROPONINI" in the last 168 hours. BNP: Invalid input(s): "POCBNP" CBG: Recent Labs  Lab 08/23/21 1557 08/23/21 2008 08/24/21 0013 08/24/21 0348 08/24/21 0809  GLUCAP 95 111* 117* 93 86   D-Dimer No results for input(s): "DDIMER" in the last 72 hours. Hgb A1c No results for input(s): "HGBA1C" in the last 72 hours. Lipid Profile No results for input(s): "CHOL", "HDL", "LDLCALC", "TRIG", "CHOLHDL", "LDLDIRECT" in the last 72 hours. Thyroid function studies No results for input(s): "TSH", "T4TOTAL", "T3FREE", "THYROIDAB" in the last 72 hours.  Invalid input(s): "FREET3" Anemia work up No results for input(s): "VITAMINB12", "FOLATE", "FERRITIN", "TIBC", "IRON", "RETICCTPCT" in the last 72 hours. Urinalysis    Component Value Date/Time   COLORURINE YELLOW 12/15/2016 1415   APPEARANCEUR HAZY (A) 12/15/2016 1415   LABSPEC 1.016 12/15/2016 1415   PHURINE 7.0 12/15/2016 1415   GLUCOSEU 150 (A) 12/15/2016 1415   HGBUR NEGATIVE 12/15/2016 1415   BILIRUBINUR NEGATIVE 12/15/2016 1415   KETONESUR NEGATIVE 12/15/2016 1415   PROTEINUR >=300 (A) 12/15/2016 1415   NITRITE NEGATIVE 12/15/2016 1415   LEUKOCYTESUR NEGATIVE 12/15/2016 1415   Sepsis Labs Recent  Labs  Lab 08/24/21 0240 08/25/21 0852 08/26/21 0314 08/28/21 0339  WBC 6.1 5.7 5.0 5.0   Microbiology Recent Results (from the past 240 hour(s))  SARS Coronavirus 2 by RT PCR (hospital order, performed in [facility] lab) *cepheid single result test* Anterior Nasal Swab     Status: None   Collection Time: 08/21/21  8:43 AM   Specimen: Anterior Nasal Swab  Result Value Ref Range Status   SARS Coronavirus 2 by RT PCR NEGATIVE NEGATIVE Final    Comment: (NOTE) SARS-CoV-2 target nucleic acids are NOT DETECTED.  The SARS-CoV-2 RNA is generally detectable in upper and lower respiratory specimens during the acute phase of infection. The lowest concentration of SARS-CoV-2 viral copies this assay can detect is 250 copies / mL. A negative result does not preclude SARS-CoV-2 infection and should not be used as the sole basis for treatment or other patient management decisions.  A negative result may occur with improper specimen collection / handling, submission of specimen other than nasopharyngeal swab, presence of viral mutation(s) within the areas targeted by this assay, and inadequate number of viral copies (<250 copies / mL). A negative result must be combined with clinical observations, patient history, and epidemiological information.  Fact Sheet for Patients:   https://www.patel.info/  Fact Sheet for Healthcare Providers: https://hall.com/  This test is not yet approved or  cleared by the [place] FDA and has been authorized for detection and/or diagnosis of SARS-CoV-2 by FDA under an Emergency Use Authorization (EUA).  This EUA will remain in effect (meaning this test can be used) for the duration of the COVID-19 declaration under Section 564(b)(1) of the Act, 21 U.S.C. section 360bbb-3(b)(1), unless the authorization is terminated or revoked sooner.  Performed at [facility], [address]  [place], [place] [address]   Blood culture (routine x 2)     Status:  None   Collection Time: 08/21/21 10:15 AM   Specimen: BLOOD  Result Value Ref Range Status   Specimen Description BLOOD BLOOD LEFT WRIST  Final   Special Requests   Final    BOTTLES DRAWN AEROBIC AND ANAEROBIC Blood Culture adequate volume   Culture   Final    NO GROWTH 5 DAYS Performed at [facility], [address], [place], [place] [address]    Report Status 08/26/2021 FINAL  Final  Blood culture (routine x 2)     Status: None   Collection Time: 08/21/21 10:19 AM   Specimen: BLOOD  Result Value Ref Range Status   Specimen Description BLOOD BLOOD LEFT FOREARM  Final   Special Requests   Final    BOTTLES DRAWN AEROBIC AND ANAEROBIC Blood Culture results may not be optimal due to an inadequate volume of blood received in culture bottles   Culture   Final    NO GROWTH 5 DAYS Performed at [facility], [address] [place], [place] [address]    Report Status 08/26/2021 FINAL  Final  Culture, Respiratory w Gram Stain     Status: None   Collection Time: 08/23/21  7:50 AM   Specimen: Tracheal Aspirate; Respiratory  Result Value Ref Range Status   Specimen Description TRACHEAL ASPIRATE  Final   Special Requests NONE  Final   Gram Stain   Final    ABUNDANT WBC PRESENT, PREDOMINANTLY PMN FEW GRAM NEGATIVE RODS RARE GRAM POSITIVE COCCI IN PAIRS Performed at [facility], [address], [place], [place] [address]    Culture MODERATE STAPHYLOCOCCUS AUREUS  Final   Report Status 08/25/2021 FINAL  Final   Organism ID, Bacteria STAPHYLOCOCCUS AUREUS  Final      Susceptibility   Staphylococcus aureus - MIC*    CIPROFLOXACIN <=0.5 SENSITIVE Sensitive     ERYTHROMYCIN >=8 RESISTANT Resistant     GENTAMICIN <=0.5 SENSITIVE Sensitive     OXACILLIN <=0.25 SENSITIVE Sensitive     TETRACYCLINE <=1 SENSITIVE Sensitive     VANCOMYCIN 1 SENSITIVE Sensitive     TRIMETH/SULFA <=10 SENSITIVE Sensitive      CLINDAMYCIN <=0.25 SENSITIVE Sensitive     RIFAMPIN <=0.5 SENSITIVE Sensitive     Inducible Clindamycin NEGATIVE Sensitive     * MODERATE STAPHYLOCOCCUS AUREUS     SIGNED:   Charlynne Cousins, MD  [facility] 08/28/2021, 9:26 AM Pager   If 7PM-7AM, please contact night-coverage [email or web address] Password [email or web address]

## 2021-08-28 NOTE — NC FL2 (Signed)
[place] MEDICAID FL2 LEVEL OF CARE SCREENING TOOL     IDENTIFICATION  Patient Name: Nathan Shepherd Birthdate: 05/26/1955 Sex: male Admission Date (Current Location): 08/21/2021  [facility] and [place] Number:  Chiropodist and Address:  [facility], [address] [place], [place] [address]      Provider Number: [id number]  Attending Physician Name and Address:  David Stall, Darin Engels, MD  Relative Name and Phone Number:       Current Level of Care: Hospital Recommended Level of Care: Assisted Living Facility Prior Approval Number:    Date Approved/Denied:   PASRR Number:    Discharge Plan: Other (Comment) (ALF)    Current Diagnoses: Patient Active Problem List   Diagnosis Date Noted   Hypokalemia 08/25/2021   Acute respiratory failure with hypoxia (HCC) 08/25/2021   Chronic atrial fibrillation with RVR (HCC) 08/25/2021   Aspiration pneumonia (HCC) 08/25/2021   AKI (acute kidney injury) (HCC) 08/25/2021   Chronic kidney disease, stage III (moderate) (HCC) 08/25/2021   Hypophosphatemia 08/25/2021   Hypomagnesemia 08/25/2021   Status epilepticus (HCC) 08/21/2021   Altered mental state 08/17/2016   Edema     Orientation RESPIRATION BLADDER Height & Weight     Self, Time, Situation, Place  Normal Continent Weight:   Height:  6\' 3"  (190.5 cm)  BEHAVIORAL SYMPTOMS/MOOD NEUROLOGICAL BOWEL NUTRITION STATUS    Convulsions/Seizures Continent    AMBULATORY STATUS COMMUNICATION OF NEEDS Skin   Limited Assist Verbally Normal                       Personal Care Assistance Level of Assistance  Bathing, Feeding, Dressing Bathing Assistance: Limited assistance Feeding assistance: Limited assistance Dressing Assistance: Limited assistance     Functional Limitations Info  Sight, Hearing, Speech Sight Info: Adequate Hearing Info: Adequate Speech Info: Adequate    SPECIAL CARE FACTORS FREQUENCY  PT (By licensed PT), OT (By  licensed OT)                    Contractures Contractures Info: Not present    Additional Factors Info                  Current Medications (08/28/2021):  This is the current hospital active medication list Current Facility-Administered Medications  Medication Dose Route Frequency Provider Last Rate Last Admin   acetaminophen (TYLENOL) tablet 650 mg  650 mg Oral Q4H PRN 08/30/2021, MD       albuterol (PROVENTIL) (2.5 MG/3ML) 0.083% nebulizer solution 2.5 mg  2.5 mg Nebulization Q6H PRN Cheri Fowler, MD       arformoterol [facility]) nebulizer solution 15 mcg  15 mcg Nebulization BID [facility] L, NP   15 mcg at 08/27/21 2012   aspirin chewable tablet 81 mg  81 mg Oral Daily 2013, MD   81 mg at 08/28/21 0939   atorvastatin (LIPITOR) tablet 80 mg  80 mg Oral Daily 08/30/21, MD   80 mg at 08/28/21 0939   budesonide (PULMICORT) nebulizer solution 0.5 mg  0.5 mg Nebulization BID 08/30/21 L, NP   0.5 mg at 08/27/21 2012   carvedilol (COREG) tablet 12.5 mg  12.5 mg Oral BID WC 2013, MD       divalproex (DEPAKOTE) DR tablet 500 mg  500 mg Oral Q12H Marinda Elk, MD   500 mg at 08/28/21 0939   docusate (COLACE) 50 MG/5ML liquid 100  mg  100 mg Oral BID PRN Cheri Fowler, MD       hydrALAZINE (APRESOLINE) injection 10 mg  10 mg Intravenous Q4H PRN Marinda Elk, MD   10 mg at 08/27/21 2151   levETIRAcetam (KEPPRA) tablet 1,500 mg  1,500 mg Oral Q12H Chand, Garnet Sierras, MD   1,500 mg at 08/28/21 0730   losartan (COZAAR) tablet 50 mg  50 mg Oral Daily Marinda Elk, MD   50 mg at 08/28/21 0944   NIFEdipine (PROCARDIA XL/NIFEDICAL XL) 24 hr tablet 60 mg  60 mg Oral Daily Marinda Elk, MD       ondansetron [facility]) injection 4 mg  4 mg Intravenous Q6H PRN Ollis, Brandi L, NP       pantoprazole (PROTONIX) EC tablet 40 mg  40 mg Oral Daily Cheri Fowler, MD   40 mg at 08/28/21 0939   polyethylene glycol (MIRALAX / GLYCOLAX) packet  17 g  17 g Oral Daily PRN Cheri Fowler, MD       warfarin (COUMADIN) tablet 7.5 mg  7.5 mg Oral q1600 Reome, Earle J, RPH   7.5 mg at 08/27/21 1611   Warfarin - Pharmacist Dosing Inpatient   Does not apply q1600 Cherylin Mylar, [facility]   Given at 08/27/21 1612     Discharge Medications: Please see discharge summary for a list of discharge medications.  Relevant Imaging Results:  Relevant Lab Results:   Additional Information Please see dc summary for list of medications. Home Health arranged with Scarlett Presto, Kathrine Cords, [name]

## 2021-11-09 ENCOUNTER — Ambulatory Visit (INDEPENDENT_AMBULATORY_CARE_PROVIDER_SITE_OTHER): Payer: Medicare Other | Admitting: Podiatry

## 2021-11-09 DIAGNOSIS — M79674 Pain in right toe(s): Secondary | ICD-10-CM | POA: Diagnosis not present

## 2021-11-09 DIAGNOSIS — B351 Tinea unguium: Secondary | ICD-10-CM | POA: Diagnosis not present

## 2021-11-09 DIAGNOSIS — M79675 Pain in left toe(s): Secondary | ICD-10-CM | POA: Diagnosis not present

## 2021-11-09 NOTE — Progress Notes (Signed)
   SUBJECTIVE Patient presents to office today complaining of elongated, thickened nails that cause pain while ambulating in shoes.  Patient is unable to trim their own nails. Patient is here for further evaluation and treatment.  Past Medical History:  Diagnosis Date   CHF (congestive heart failure) (HCC)    CKD (chronic kidney disease) stage 3, GFR 30-59 ml/min (HCC)    Edema    Hyperlipemia    Hypertension    Seizures ([place])     OBJECTIVE General Patient is awake, alert, and oriented x 3 and in no acute distress. Derm Skin is dry and supple bilateral. Negative open lesions or macerations. Remaining integument unremarkable. Nails are tender, long, thickened and dystrophic with subungual debris, consistent with onychomycosis, 1-5 bilateral. No signs of infection noted.  Hyperkeratotic preulcerative callus tissue also noted to the bilateral feet Vasc  DP and PT pedal pulses palpable bilaterally. Temperature gradient within normal limits.  Neuro Epicritic and protective threshold sensation grossly intact bilaterally.  Musculoskeletal Exam No symptomatic pedal deformities noted bilateral. Muscular strength within normal limits.  ASSESSMENT 1.  Pain due to onychomycosis of toenails both 2.  Preulcerative callus lesions bilateral feet  PLAN OF CARE 1. Patient evaluated today.  2. Instructed to maintain good pedal hygiene and foot care.  3. Mechanical debridement of nails 1-5 bilaterally performed using a nail nipper. Filed with dremel without incident.  4.  Excisional debridement of the hyperkeratotic callus tissue was performed using a tissue nipper without incident or bleeding Return to clinic in 3 mos. for routine foot care   Edrick Kins, DPM [facility]  Dr. Edrick Kins, DPM    [address], [place] [address]                Office [phone]  Fax [phone]

## 2022-02-17 ENCOUNTER — Ambulatory Visit: Payer: Medicare Other | Admitting: Podiatry

## 2022-04-08 ENCOUNTER — Encounter: Payer: Self-pay | Admitting: Podiatry

## 2022-04-25 ENCOUNTER — Ambulatory Visit: Payer: Medicare Other | Admitting: Podiatry

## 2022-05-09 ENCOUNTER — Emergency Department
Admission: EM | Admit: 2022-05-09 | Discharge: 2022-05-10 | Disposition: A | Discharge status: Home / Self Care | Payer: Medicare Other | Attending: Emergency Medicine | Admitting: Emergency Medicine

## 2022-05-09 DIAGNOSIS — I130 Hypertensive heart and chronic kidney disease with heart failure and stage 1 through stage 4 chronic kidney disease, or unspecified chronic kidney disease: Secondary | ICD-10-CM | POA: Insufficient documentation

## 2022-05-09 DIAGNOSIS — M7989 Other specified soft tissue disorders: Secondary | ICD-10-CM

## 2022-05-09 DIAGNOSIS — N189 Chronic kidney disease, unspecified: Secondary | ICD-10-CM | POA: Insufficient documentation

## 2022-05-09 DIAGNOSIS — I509 Heart failure, unspecified: Secondary | ICD-10-CM | POA: Diagnosis not present
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
## 2022-05-09 NOTE — ED Notes (Signed)
Nathan Shepherd listed as emergency contact on facility sheet returned call and identified himself as being affiliated with [facility]. When informed that patient was up for discharge he stated that he should be sent home via ambulance. This nurse informed him that [facility] might not be willing to transport him to [facility], he stated "they always have before, there is nobody available to go get him".

## 2022-05-09 NOTE — ED Notes (Signed)
Bedbug found on patients jacket while doing initial assessment. Insect captured in specimen container and labeled. Charge nurse notified about situation and specimen container taken to charge station for verification. Provider notified.

## 2022-05-09 NOTE — ED Notes (Signed)
Attempted to contact Nathan Shepherd who is listed as patients legal guardian. Call went to voice mail.
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
## 2022-05-10 NOTE — ED Notes (Signed)
Attempted to call [facility] group home at [phone] for them to come pick up pt as was agreed upon last night. No answer. CSW notified and will attempt to contact.
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
## 2022-05-10 NOTE — ED Notes (Signed)
Call made back to [facility] and spoke again with Nathan Shepherd who reports she is not able to make contact with either of her supervisors. Writer requesting legal guardianship information to ensure contact information is correct in pts chart.

## 2022-05-10 NOTE — ED Notes (Signed)
Call made to [facility], [place] and requested to speak with crew chief to gain assitance with transporting this pt back to [facility] due to [facility] with original transport. Per chief Robinhood, [place] will not transport and no other options known by crew chief. Writer reached out to [facility] and also informed that they would not be able to transport to [facility].    Call made back to [facility] and writer spoke with Consuello Closs, care taker who reports she lives at facility and is unable to leave other residence because she is the only current care taker. [facility] nurse that she will make calls to her supervisors, Barbie Banner and Larkin Ina.
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
## 2022-05-12 ENCOUNTER — Emergency Department: Payer: Medicare Other

## 2022-05-12 ENCOUNTER — Inpatient Hospital Stay (HOSPITAL_COMMUNITY): Payer: Medicare Other

## 2022-05-12 ENCOUNTER — Encounter (HOSPITAL_COMMUNITY): Payer: Self-pay

## 2022-05-12 ENCOUNTER — Inpatient Hospital Stay (HOSPITAL_COMMUNITY)
Admission: RE | Admit: 2022-05-12 | Discharge: 2022-05-17 | DRG: 100 | Disposition: A | Discharge status: Home Health | Payer: Medicare Other | Source: Other Acute Inpatient Hospital | Attending: Internal Medicine | Admitting: Internal Medicine

## 2022-05-12 ENCOUNTER — Other Ambulatory Visit: Payer: Self-pay

## 2022-05-12 ENCOUNTER — Emergency Department
Admission: EM | Admit: 2022-05-12 | Discharge: 2022-05-12 | Disposition: A | Discharge status: Other Acute Inpatient Hospital | Payer: Medicare Other | Attending: Emergency Medicine | Admitting: Emergency Medicine

## 2022-05-12 DIAGNOSIS — F0390 Unspecified dementia without behavioral disturbance: Secondary | ICD-10-CM | POA: Diagnosis present

## 2022-05-12 DIAGNOSIS — N1831 Chronic kidney disease, stage 3a: Secondary | ICD-10-CM | POA: Diagnosis present

## 2022-05-12 DIAGNOSIS — Z781 Physical restraint status: Secondary | ICD-10-CM

## 2022-05-12 DIAGNOSIS — Z9181 History of falling: Secondary | ICD-10-CM

## 2022-05-12 DIAGNOSIS — G40901 Epilepsy, unspecified, not intractable, with status epilepticus: Secondary | ICD-10-CM | POA: Insufficient documentation

## 2022-05-12 DIAGNOSIS — I160 Hypertensive urgency: Secondary | ICD-10-CM

## 2022-05-12 DIAGNOSIS — R34 Anuria and oliguria: Secondary | ICD-10-CM | POA: Diagnosis not present

## 2022-05-12 DIAGNOSIS — Z7901 Long term (current) use of anticoagulants: Secondary | ICD-10-CM | POA: Diagnosis not present

## 2022-05-12 DIAGNOSIS — I482 Chronic atrial fibrillation, unspecified: Secondary | ICD-10-CM | POA: Diagnosis present

## 2022-05-12 DIAGNOSIS — I480 Paroxysmal atrial fibrillation: Secondary | ICD-10-CM | POA: Diagnosis not present

## 2022-05-12 DIAGNOSIS — J9601 Acute respiratory failure with hypoxia: Secondary | ICD-10-CM

## 2022-05-12 DIAGNOSIS — R4701 Aphasia: Secondary | ICD-10-CM | POA: Diagnosis present

## 2022-05-12 DIAGNOSIS — G40919 Epilepsy, unspecified, intractable, without status epilepticus: Secondary | ICD-10-CM

## 2022-05-12 DIAGNOSIS — E785 Hyperlipidemia, unspecified: Secondary | ICD-10-CM | POA: Diagnosis present

## 2022-05-12 DIAGNOSIS — R509 Fever, unspecified: Secondary | ICD-10-CM | POA: Diagnosis not present

## 2022-05-12 DIAGNOSIS — R569 Unspecified convulsions: Secondary | ICD-10-CM | POA: Diagnosis present

## 2022-05-12 DIAGNOSIS — N179 Acute kidney failure, unspecified: Secondary | ICD-10-CM | POA: Diagnosis present

## 2022-05-12 DIAGNOSIS — I5032 Chronic diastolic (congestive) heart failure: Secondary | ICD-10-CM | POA: Diagnosis present

## 2022-05-12 DIAGNOSIS — Z8673 Personal history of transient ischemic attack (TIA), and cerebral infarction without residual deficits: Secondary | ICD-10-CM | POA: Diagnosis not present

## 2022-05-12 DIAGNOSIS — J9600 Acute respiratory failure, unspecified whether with hypoxia or hypercapnia: Secondary | ICD-10-CM | POA: Diagnosis present

## 2022-05-12 DIAGNOSIS — R791 Abnormal coagulation profile: Secondary | ICD-10-CM | POA: Insufficient documentation

## 2022-05-12 DIAGNOSIS — R001 Bradycardia, unspecified: Secondary | ICD-10-CM | POA: Diagnosis present

## 2022-05-12 DIAGNOSIS — R253 Fasciculation: Secondary | ICD-10-CM | POA: Diagnosis present

## 2022-05-12 DIAGNOSIS — D631 Anemia in chronic kidney disease: Secondary | ICD-10-CM | POA: Diagnosis present

## 2022-05-12 DIAGNOSIS — E162 Hypoglycemia, unspecified: Secondary | ICD-10-CM | POA: Diagnosis not present

## 2022-05-12 DIAGNOSIS — I130 Hypertensive heart and chronic kidney disease with heart failure and stage 1 through stage 4 chronic kidney disease, or unspecified chronic kidney disease: Secondary | ICD-10-CM | POA: Diagnosis present

## 2022-05-12 LAB — CBC WITH DIFFERENTIAL/PLATELET
Abs Immature Granulocytes: 0.01 10*3/uL (ref 0.00–0.07)
Basophils Absolute: 0 10*3/uL (ref 0.0–0.1)
Eosinophils Relative: 2 %
HCT: 37.9 % — ABNORMAL LOW (ref 39.0–52.0)
Hemoglobin: 11.8 g/dL — ABNORMAL LOW (ref 13.0–17.0)
Lymphocytes Relative: 23 %
MCH: 26.6 pg (ref 26.0–34.0)
MCHC: 31.1 g/dL (ref 30.0–36.0)
MCV: 85.6 fL (ref 80.0–100.0)
Monocytes Absolute: 0.3 10*3/uL (ref 0.1–1.0)
Neutro Abs: 2.7 10*3/uL (ref 1.7–7.7)
RBC: 4.43 MIL/uL (ref 4.22–5.81)
WBC: 4.1 10*3/uL (ref 4.0–10.5)
nRBC: 0 % (ref 0.0–0.2)

## 2022-05-12 LAB — BLOOD GAS, ARTERIAL
Bicarbonate: 31.3 mmol/L — ABNORMAL HIGH (ref 20.0–28.0)
FIO2: 40 %
Patient temperature: 37
RATE: 16 resp/min
pH, Arterial: 7.47 — ABNORMAL HIGH (ref 7.35–7.45)
pO2, Arterial: 165 mmHg — ABNORMAL HIGH (ref 83–108)

## 2022-05-12 LAB — BASIC METABOLIC PANEL
BUN: 12 mg/dL (ref 8–23)
Calcium: 9.2 mg/dL (ref 8.9–10.3)
Chloride: 101 mmol/L (ref 98–111)
GFR, Estimated: 58 mL/min — ABNORMAL LOW (ref >60)
Glucose, Bld: 78 mg/dL (ref 70–99)
Potassium: 3.9 mmol/L (ref 3.5–5.1)
Sodium: 141 mmol/L (ref 135–145)

## 2022-05-12 LAB — MAGNESIUM
Magnesium: 1.9 mg/dL (ref 1.7–2.4)
Magnesium: 2.3 mg/dL (ref 1.7–2.4)

## 2022-05-12 LAB — COMPREHENSIVE METABOLIC PANEL
ALT: 16 U/L (ref 0–44)
AST: 22 U/L (ref 15–41)
Albumin: 3.6 g/dL (ref 3.5–5.0)
Alkaline Phosphatase: 59 U/L (ref 38–126)
Anion gap: 6 (ref 5–15)
BUN: 17 mg/dL (ref 8–23)
CO2: 29 mmol/L (ref 22–32)
Calcium: 8.9 mg/dL (ref 8.9–10.3)
Chloride: 104 mmol/L (ref 98–111)
GFR, Estimated: 53 mL/min — ABNORMAL LOW (ref >60)
Potassium: 4.1 mmol/L (ref 3.5–5.1)
Sodium: 139 mmol/L (ref 135–145)
Total Bilirubin: 1.1 mg/dL (ref 0.3–1.2)
Total Protein: 7.3 g/dL (ref 6.5–8.1)

## 2022-05-12 LAB — URINE DRUG SCREEN, QUALITATIVE (ARMC ONLY)
Barbiturates, Ur Screen: NOT DETECTED
Cannabinoid 50 Ng, Ur ~~LOC~~: NOT DETECTED
Cocaine Metabolite,Ur ~~LOC~~: NOT DETECTED
MDMA (Ecstasy)Ur Screen: NOT DETECTED
Methadone Scn, Ur: NOT DETECTED
Opiate, Ur Screen: NOT DETECTED
Phencyclidine (PCP) Ur S: NOT DETECTED
Tricyclic, Ur Screen: NOT DETECTED

## 2022-05-12 LAB — CBG MONITORING, ED: Glucose-Capillary: 73 mg/dL (ref 70–99)

## 2022-05-12 LAB — GLUCOSE, CAPILLARY
Glucose-Capillary: 62 mg/dL — ABNORMAL LOW (ref 70–99)
Glucose-Capillary: 108 mg/dL — ABNORMAL HIGH (ref 70–99)

## 2022-05-12 LAB — PROCALCITONIN: Procalcitonin: <0.1 ng/mL

## 2022-05-12 LAB — ECHOCARDIOGRAM COMPLETE
Height: 78 in
S' Lateral: 3.4 cm

## 2022-05-12 LAB — PROTIME-INR
INR: 2.1 — ABNORMAL HIGH (ref 0.8–1.2)
Prothrombin Time: 23.8 seconds — ABNORMAL HIGH (ref 11.4–15.2)
Prothrombin Time: 25.5 seconds — ABNORMAL HIGH (ref 11.4–15.2)

## 2022-05-12 LAB — URINALYSIS, ROUTINE W REFLEX MICROSCOPIC
Bacteria, UA: NONE SEEN
Bilirubin Urine: NEGATIVE
Glucose, UA: NEGATIVE mg/dL
Ketones, ur: NEGATIVE mg/dL
Specific Gravity, Urine: 1.008 (ref 1.005–1.030)
Squamous Epithelial / HPF: NONE SEEN /HPF (ref 0–5)
pH: 8 (ref 5.0–8.0)

## 2022-05-12 LAB — PHOSPHORUS: Phosphorus: 1.4 mg/dL — ABNORMAL LOW (ref 2.5–4.6)

## 2022-05-12 LAB — VALPROIC ACID LEVEL: Valproic Acid Lvl: 47 ug/mL — ABNORMAL LOW (ref 50.0–100.0)

## 2022-05-12 MED ORDER — LEVETIRACETAM IN NACL 1000 MG/100ML IV SOLN
1000.0000 mg | Freq: Once | INTRAVENOUS | Status: AC
  Administered 2022-05-12: 1000 mg via INTRAVENOUS

## 2022-05-12 MED ORDER — DEXTROSE 50 % IV SOLN
INTRAVENOUS | Status: AC
  Filled 2022-05-12: qty 50

## 2022-05-12 MED ORDER — LEVETIRACETAM IN NACL 1500 MG/100ML IV SOLN
1500.0000 mg | Freq: Two times a day (BID) | INTRAVENOUS | Status: DC
  Administered 2022-05-12 – 2022-05-15 (×6): 1500 mg via INTRAVENOUS
  Filled 2022-05-12 (×6): qty 100

## 2022-05-12 MED ORDER — POLYETHYLENE GLYCOL 3350 17 G PO PACK: 17.0000 g | PACK | Freq: Every day | ORAL | Status: DC | PRN

## 2022-05-12 MED ORDER — CLEVIDIPINE BUTYRATE 0.5 MG/ML IV EMUL
0.0000 mg/h | INTRAVENOUS | Status: DC
  Administered 2022-05-12: 2 mg/h via INTRAVENOUS
  Filled 2022-05-12: qty 100

## 2022-05-12 MED ORDER — CHLORHEXIDINE GLUCONATE CLOTH 2 % EX PADS
6.0000 | MEDICATED_PAD | Freq: Every day | CUTANEOUS | Status: DC
  Administered 2022-05-12 – 2022-05-17 (×5): 6 via TOPICAL

## 2022-05-12 MED ORDER — FENTANYL CITRATE PF 50 MCG/ML IJ SOSY
50.0000 ug | PREFILLED_SYRINGE | Freq: Once | INTRAMUSCULAR | Status: AC
  Administered 2022-05-12: 50 ug via INTRAVENOUS
  Filled 2022-05-12: qty 1

## 2022-05-12 MED ORDER — POLYETHYLENE GLYCOL 3350 17 G PO PACK
17.0000 g | PACK | Freq: Every day | ORAL | Status: DC
  Administered 2022-05-13 – 2022-05-15 (×2): 17 g via ORAL
  Filled 2022-05-12 (×3): qty 1

## 2022-05-12 MED ORDER — ROCURONIUM BROMIDE 10 MG/ML (PF) SYRINGE
100.0000 mg | PREFILLED_SYRINGE | Freq: Once | INTRAVENOUS | Status: DC
  Filled 2022-05-12: qty 10

## 2022-05-12 MED ORDER — ORAL CARE MOUTH RINSE: 15.0000 mL | OROMUCOSAL | Status: DC | PRN

## 2022-05-12 MED ORDER — DEXTROSE 50 % IV SOLN
25.0000 g | INTRAVENOUS | Status: AC
  Administered 2022-05-12: 25 g via INTRAVENOUS

## 2022-05-12 MED ORDER — LORAZEPAM 2 MG/ML IJ SOLN
INTRAMUSCULAR | Status: AC
  Filled 2022-05-12: qty 1

## 2022-05-12 MED ORDER — ORAL CARE MOUTH RINSE
15.0000 mL | OROMUCOSAL | Status: DC
  Administered 2022-05-12 – 2022-05-13 (×11): 15 mL via OROMUCOSAL

## 2022-05-12 MED ORDER — PROPOFOL 1000 MG/100ML IV EMUL
0.0000 ug/kg/min | INTRAVENOUS | Status: DC
  Administered 2022-05-12: 30 ug/kg/min via INTRAVENOUS
  Administered 2022-05-13: 10 ug/kg/min via INTRAVENOUS
  Filled 2022-05-12 (×2): qty 100

## 2022-05-12 MED ORDER — FENTANYL CITRATE PF 50 MCG/ML IJ SOSY: 50.0000 ug | PREFILLED_SYRINGE | Freq: Once | INTRAMUSCULAR | Status: DC

## 2022-05-12 MED ORDER — PROPOFOL 1000 MG/100ML IV EMUL
5.0000 ug/kg/min | INTRAVENOUS | Status: DC
  Administered 2022-05-12: 30 ug/kg/min via INTRAVENOUS
  Filled 2022-05-12: qty 100

## 2022-05-12 MED ORDER — DEXTROSE 50 % IV SOLN
INTRAVENOUS | Status: AC
  Administered 2022-05-12: 50 mL
  Filled 2022-05-12: qty 50

## 2022-05-12 MED ORDER — PROSOURCE TF20 ENFIT COMPATIBL EN LIQD
60.0000 mL | Freq: Two times a day (BID) | ENTERAL | Status: DC
  Administered 2022-05-12 – 2022-05-13 (×2): 60 mL
  Filled 2022-05-12 (×2): qty 60

## 2022-05-12 MED ORDER — LEVETIRACETAM IN NACL 500 MG/100ML IV SOLN
500.0000 mg | Freq: Once | INTRAVENOUS | Status: AC
  Administered 2022-05-12: 500 mg via INTRAVENOUS
  Filled 2022-05-12: qty 100

## 2022-05-12 MED ORDER — OSMOLITE 1.5 CAL PO LIQD
1000.0000 mL | ORAL | Status: DC
  Administered 2022-05-12: 1000 mL
  Filled 2022-05-12: qty 1000

## 2022-05-12 MED ORDER — LORAZEPAM 2 MG/ML IJ SOLN
4.0000 mg | Freq: Once | INTRAMUSCULAR | Status: DC
  Filled 2022-05-12: qty 2

## 2022-05-12 MED ORDER — VALPROATE SODIUM 100 MG/ML IV SOLN
375.0000 mg | Freq: Three times a day (TID) | INTRAVENOUS | Status: DC
  Administered 2022-05-12 – 2022-05-13 (×2): 375 mg via INTRAVENOUS
  Filled 2022-05-12 (×2): qty 3.75

## 2022-05-12 MED ORDER — DOPAMINE-DEXTROSE 3.2-5 MG/ML-% IV SOLN
0.0000 ug/kg/min | INTRAVENOUS | Status: DC
  Administered 2022-05-12: 5 ug/kg/min via INTRAVENOUS
  Filled 2022-05-12: qty 250

## 2022-05-12 MED ORDER — SUCCINYLCHOLINE CHLORIDE 200 MG/10ML IV SOSY
150.0000 mg | PREFILLED_SYRINGE | Freq: Once | INTRAVENOUS | Status: AC
  Administered 2022-05-12: 150 mg via INTRAVENOUS
  Filled 2022-05-12: qty 10

## 2022-05-12 MED ORDER — INSULIN ASPART 100 UNIT/ML IJ SOLN
0.0000 [IU] | INTRAMUSCULAR | Status: DC
  Administered 2022-05-13: 2 [IU] via SUBCUTANEOUS

## 2022-05-12 MED ORDER — FENTANYL 2500MCG IN NS 250ML (10MCG/ML) PREMIX INFUSION
50.0000 ug/h | INTRAVENOUS | Status: DC
  Administered 2022-05-12: 50 ug/h via INTRAVENOUS
  Filled 2022-05-12: qty 250

## 2022-05-12 MED ORDER — LORAZEPAM 2 MG/ML IJ SOLN
2.0000 mg | Freq: Once | INTRAMUSCULAR | Status: AC
  Administered 2022-05-12: 2 mg via INTRAVENOUS

## 2022-05-12 MED ORDER — DOCUSATE SODIUM 100 MG PO CAPS: 100.0000 mg | ORAL_CAPSULE | Freq: Two times a day (BID) | ORAL | Status: DC | PRN

## 2022-05-12 MED ORDER — FAMOTIDINE 20 MG PO TABS
20.0000 mg | ORAL_TABLET | Freq: Two times a day (BID) | ORAL | Status: DC
  Administered 2022-05-12 – 2022-05-15 (×5): 20 mg
  Filled 2022-05-12 (×5): qty 1

## 2022-05-12 MED ORDER — ETOMIDATE 2 MG/ML IV SOLN
20.0000 mg | Freq: Once | INTRAVENOUS | Status: AC
  Administered 2022-05-12: 20 mg via INTRAVENOUS
  Filled 2022-05-12: qty 10

## 2022-05-12 MED ORDER — IOHEXOL 350 MG/ML SOLN
75.0000 mL | Freq: Once | INTRAVENOUS | Status: AC | PRN
  Administered 2022-05-12: 75 mL via INTRAVENOUS

## 2022-05-12 MED ORDER — MAGNESIUM SULFATE 2 GM/50ML IV SOLN
2.0000 g | Freq: Once | INTRAVENOUS | Status: AC
  Administered 2022-05-12: 2 g via INTRAVENOUS
  Filled 2022-05-12: qty 50

## 2022-05-12 MED ORDER — SODIUM CHLORIDE 0.9 % IV SOLN: INTRAVENOUS | Status: DC | PRN

## 2022-05-12 MED ORDER — VALPROATE SODIUM 100 MG/ML IV SOLN
1250.0000 mg | Freq: Once | INTRAVENOUS | Status: AC
  Administered 2022-05-12: 1250 mg via INTRAVENOUS
  Filled 2022-05-12: qty 10

## 2022-05-12 MED ORDER — VALPROATE SODIUM 100 MG/ML IV SOLN
500.0000 mg | Freq: Once | INTRAVENOUS | Status: AC
  Administered 2022-05-12: 500 mg via INTRAVENOUS
  Filled 2022-05-12: qty 5

## 2022-05-12 MED ORDER — SODIUM CHLORIDE 0.9 % IV SOLN: 4500.0000 mg | Freq: Once | INTRAVENOUS | Status: DC

## 2022-05-12 MED ORDER — FENTANYL BOLUS VIA INFUSION
50.0000 ug | INTRAVENOUS | Status: DC | PRN
  Administered 2022-05-12: 50 ug via INTRAVENOUS

## 2022-05-12 NOTE — ED Provider Notes (Addendum)
CC: status epilepticus History:   LEVEL 5 CAVEAT DUE TO INTUBATION. In short,  Patient transferred from [facility] regional. Has been admitted to the ICU team but ICU bed not available immediately therefore was transfered ED to ED to have ICU team manage from the ED until med available and for LTM.  Patient arrives ventilated.  Intubated for status epilepticus.  Dr. Myrla Halsted with ICU team already in the ED upon patient arrival.  Vital: WNL  Exam: General: sedated HEENT: wnl Lungs: clear breath sounds, on ventiator Heart: nsr Nuero: sedated, pupils equal and reactive  MDM: Pt transferred from [place] for LTM for status epipleticus. ICU team aware and already has placed admit orders. Labs and imahging reviewed from [place] are unremarkable. Pt stable but critical condition on ventilator.   Virgina Norfolk, DO 05/12/22 1307    Virgina Norfolk, DO 05/30/22 1344

## 2022-05-13 DIAGNOSIS — I480 Paroxysmal atrial fibrillation: Secondary | ICD-10-CM

## 2022-05-13 DIAGNOSIS — J9601 Acute respiratory failure with hypoxia: Secondary | ICD-10-CM | POA: Diagnosis not present

## 2022-05-13 DIAGNOSIS — G40901 Epilepsy, unspecified, not intractable, with status epilepticus: Secondary | ICD-10-CM | POA: Diagnosis not present

## 2022-05-13 DIAGNOSIS — R569 Unspecified convulsions: Secondary | ICD-10-CM | POA: Diagnosis not present

## 2022-05-13 DIAGNOSIS — R001 Bradycardia, unspecified: Secondary | ICD-10-CM | POA: Diagnosis not present

## 2022-05-13 LAB — GLUCOSE, CAPILLARY
Glucose-Capillary: 145 mg/dL — ABNORMAL HIGH (ref 70–99)
Glucose-Capillary: 149 mg/dL — ABNORMAL HIGH (ref 70–99)
Glucose-Capillary: 72 mg/dL (ref 70–99)
Glucose-Capillary: 78 mg/dL (ref 70–99)
Glucose-Capillary: 80 mg/dL (ref 70–99)
Glucose-Capillary: 87 mg/dL (ref 70–99)

## 2022-05-13 LAB — RENAL FUNCTION PANEL
Anion gap: 12 (ref 5–15)
BUN: 12 mg/dL (ref 8–23)
CO2: 22 mmol/L (ref 22–32)
Chloride: 105 mmol/L (ref 98–111)
Creatinine, Ser: 1.3 mg/dL — ABNORMAL HIGH (ref 0.61–1.24)
GFR, Estimated: >60 mL/min (ref >60)
Glucose, Bld: 116 mg/dL — ABNORMAL HIGH (ref 70–99)
Phosphorus: 1.8 mg/dL — ABNORMAL LOW (ref 2.5–4.6)
Potassium: 3.5 mmol/L (ref 3.5–5.1)
Sodium: 139 mmol/L (ref 135–145)

## 2022-05-13 LAB — CBC
HCT: 43.6 % (ref 39.0–52.0)
Hemoglobin: 14.2 g/dL (ref 13.0–17.0)
MCH: 26.6 pg (ref 26.0–34.0)
MCHC: 32.6 g/dL (ref 30.0–36.0)
MCV: 81.6 fL (ref 80.0–100.0)
Platelets: 178 10*3/uL (ref 150–400)
RBC: 5.34 MIL/uL (ref 4.22–5.81)
WBC: 5.5 10*3/uL (ref 4.0–10.5)

## 2022-05-13 LAB — MAGNESIUM: Magnesium: 2.2 mg/dL (ref 1.7–2.4)

## 2022-05-13 LAB — PROTIME-INR
INR: 2.4 — ABNORMAL HIGH (ref 0.8–1.2)
Prothrombin Time: 26.3 seconds — ABNORMAL HIGH (ref 11.4–15.2)

## 2022-05-13 LAB — PHOSPHORUS: Phosphorus: 1.8 mg/dL — ABNORMAL LOW (ref 2.5–4.6)

## 2022-05-13 LAB — LEVETIRACETAM LEVEL: Levetiracetam Lvl: 28.8 ug/mL (ref 10.0–40.0)

## 2022-05-13 MED ORDER — ORAL CARE MOUTH RINSE: 15.0000 mL | OROMUCOSAL | Status: DC | PRN

## 2022-05-13 MED ORDER — LACTATED RINGERS IV SOLN: INTRAVENOUS | Status: DC

## 2022-05-13 MED ORDER — VALPROATE SODIUM 100 MG/ML IV SOLN
500.0000 mg | Freq: Three times a day (TID) | INTRAVENOUS | Status: AC
  Administered 2022-05-13 – 2022-05-14 (×4): 500 mg via INTRAVENOUS
  Filled 2022-05-13 (×4): qty 500

## 2022-05-13 MED ORDER — ACETAMINOPHEN 160 MG/5ML PO SOLN
650.0000 mg | ORAL | Status: DC | PRN
  Administered 2022-05-13: 650 mg
  Filled 2022-05-13 (×2): qty 20.3

## 2022-05-13 MED ORDER — POTASSIUM PHOSPHATES 15 MMOLE/5ML IV SOLN
30.0000 mmol | Freq: Once | INTRAVENOUS | Status: AC
  Administered 2022-05-13: 30 mmol via INTRAVENOUS
  Filled 2022-05-13: qty 10

## 2022-05-14 DIAGNOSIS — R569 Unspecified convulsions: Secondary | ICD-10-CM | POA: Diagnosis not present

## 2022-05-14 DIAGNOSIS — G40901 Epilepsy, unspecified, not intractable, with status epilepticus: Secondary | ICD-10-CM | POA: Diagnosis not present

## 2022-05-14 LAB — BASIC METABOLIC PANEL
CO2: 26 mmol/L (ref 22–32)
Calcium: 8.2 mg/dL — ABNORMAL LOW (ref 8.9–10.3)
Calcium: 8.9 mg/dL (ref 8.9–10.3)
Chloride: 102 mmol/L (ref 98–111)
Creatinine, Ser: 1.57 mg/dL — ABNORMAL HIGH (ref 0.61–1.24)
GFR, Estimated: 49 mL/min — ABNORMAL LOW (ref >60)
GFR, Estimated: 49 mL/min — ABNORMAL LOW (ref >60)
Glucose, Bld: 110 mg/dL — ABNORMAL HIGH (ref 70–99)
Potassium: 3.9 mmol/L (ref 3.5–5.1)
Potassium: 4.1 mmol/L (ref 3.5–5.1)

## 2022-05-14 LAB — CBC
HCT: 35.7 % — ABNORMAL LOW (ref 39.0–52.0)
MCH: 26.1 pg (ref 26.0–34.0)
MCHC: 31.7 g/dL (ref 30.0–36.0)
Platelets: 168 10*3/uL (ref 150–400)
RDW: 15.5 % (ref 11.5–15.5)
WBC: 4.7 10*3/uL (ref 4.0–10.5)
nRBC: 0 % (ref 0.0–0.2)

## 2022-05-14 LAB — GLUCOSE, CAPILLARY
Glucose-Capillary: 93 mg/dL (ref 70–99)
Glucose-Capillary: 116 mg/dL — ABNORMAL HIGH (ref 70–99)

## 2022-05-14 LAB — MAGNESIUM: Magnesium: 2.1 mg/dL (ref 1.7–2.4)

## 2022-05-14 LAB — PROTIME-INR: Prothrombin Time: 24.4 seconds — ABNORMAL HIGH (ref 11.4–15.2)

## 2022-05-14 LAB — PHOSPHORUS: Phosphorus: 3.8 mg/dL (ref 2.5–4.6)

## 2022-05-14 MED ORDER — VALPROIC ACID 250 MG PO CAPS: 500.0000 mg | ORAL_CAPSULE | Freq: Two times a day (BID) | ORAL | Status: DC

## 2022-05-14 MED ORDER — SODIUM CHLORIDE 0.9 % IV BOLUS
500.0000 mL | Freq: Once | INTRAVENOUS | Status: AC
  Administered 2022-05-14: 500 mL via INTRAVENOUS

## 2022-05-14 MED ORDER — VALPROIC ACID 250 MG PO CAPS
750.0000 mg | ORAL_CAPSULE | Freq: Two times a day (BID) | ORAL | Status: DC
  Administered 2022-05-14 – 2022-05-16 (×4): 750 mg via ORAL
  Filled 2022-05-14 (×5): qty 3

## 2022-05-14 MED ORDER — VALPROATE SODIUM 100 MG/ML IV SOLN
500.0000 mg | Freq: Once | INTRAVENOUS | Status: AC
  Filled 2022-05-14: qty 5

## 2022-05-15 DIAGNOSIS — R569 Unspecified convulsions: Secondary | ICD-10-CM

## 2022-05-15 LAB — CBC
HCT: 33.6 % — ABNORMAL LOW (ref 39.0–52.0)
MCH: 26.5 pg (ref 26.0–34.0)
MCHC: 32.1 g/dL (ref 30.0–36.0)
MCV: 82.4 fL (ref 80.0–100.0)
Platelets: 153 10*3/uL (ref 150–400)
RBC: 4.08 MIL/uL — ABNORMAL LOW (ref 4.22–5.81)
RDW: 15.3 % (ref 11.5–15.5)
WBC: 4.5 10*3/uL (ref 4.0–10.5)
nRBC: 0 % (ref 0.0–0.2)

## 2022-05-15 LAB — PROTIME-INR
INR: 1.5 — ABNORMAL HIGH (ref 0.8–1.2)
Prothrombin Time: 18.7 seconds — ABNORMAL HIGH (ref 11.4–15.2)

## 2022-05-15 LAB — COMPREHENSIVE METABOLIC PANEL
Albumin: 2.3 g/dL — ABNORMAL LOW (ref 3.5–5.0)
CO2: 26 mmol/L (ref 22–32)
Calcium: 8.3 mg/dL — ABNORMAL LOW (ref 8.9–10.3)
Chloride: 105 mmol/L (ref 98–111)
Creatinine, Ser: 1.47 mg/dL — ABNORMAL HIGH (ref 0.61–1.24)
GFR, Estimated: 53 mL/min — ABNORMAL LOW (ref >60)
Potassium: 3.8 mmol/L (ref 3.5–5.1)
Sodium: 137 mmol/L (ref 135–145)
Total Bilirubin: 0.7 mg/dL (ref 0.3–1.2)
Total Protein: 5.3 g/dL — ABNORMAL LOW (ref 6.5–8.1)

## 2022-05-15 LAB — GLUCOSE, CAPILLARY
Glucose-Capillary: 83 mg/dL (ref 70–99)
Glucose-Capillary: 140 mg/dL — ABNORMAL HIGH (ref 70–99)

## 2022-05-15 LAB — MAGNESIUM: Magnesium: 1.8 mg/dL (ref 1.7–2.4)

## 2022-05-15 LAB — PHOSPHORUS: Phosphorus: 2.5 mg/dL (ref 2.5–4.6)

## 2022-05-15 MED ORDER — DONEPEZIL HCL 10 MG PO TABS
5.0000 mg | ORAL_TABLET | Freq: Every day | ORAL | Status: DC
  Administered 2022-05-15 – 2022-05-16 (×2): 5 mg via ORAL
  Filled 2022-05-15 (×2): qty 1

## 2022-05-15 MED ORDER — FAMOTIDINE 20 MG PO TABS
20.0000 mg | ORAL_TABLET | Freq: Two times a day (BID) | ORAL | Status: DC
  Administered 2022-05-15 – 2022-05-17 (×4): 20 mg via ORAL
  Filled 2022-05-15 (×4): qty 1

## 2022-05-15 MED ORDER — WARFARIN - PHARMACIST DOSING INPATIENT: Freq: Every day | Status: DC

## 2022-05-15 MED ORDER — ACETAMINOPHEN 325 MG PO TABS: 650.0000 mg | ORAL_TABLET | Freq: Four times a day (QID) | ORAL | Status: DC | PRN

## 2022-05-15 MED ORDER — CARVEDILOL 12.5 MG PO TABS
12.5000 mg | ORAL_TABLET | Freq: Two times a day (BID) | ORAL | Status: DC
  Administered 2022-05-15 – 2022-05-17 (×5): 12.5 mg via ORAL
  Filled 2022-05-15 (×5): qty 1

## 2022-05-15 MED ORDER — ENOXAPARIN SODIUM 100 MG/ML IJ SOSY
90.0000 mg | PREFILLED_SYRINGE | Freq: Two times a day (BID) | INTRAMUSCULAR | Status: DC
  Administered 2022-05-15 – 2022-05-17 (×5): 90 mg via SUBCUTANEOUS
  Filled 2022-05-15 (×6): qty 0.9

## 2022-05-15 MED ORDER — HEPARIN (PORCINE) 25000 UT/250ML-% IV SOLN: 1300.0000 [IU]/h | INTRAVENOUS | Status: DC

## 2022-05-15 MED ORDER — NIFEDIPINE ER OSMOTIC RELEASE 60 MG PO TB24
60.0000 mg | ORAL_TABLET | Freq: Every day | ORAL | Status: DC
  Administered 2022-05-15 – 2022-05-17 (×3): 60 mg via ORAL
  Filled 2022-05-15 (×3): qty 1

## 2022-05-15 MED ORDER — LEVETIRACETAM 750 MG PO TABS
1500.0000 mg | ORAL_TABLET | Freq: Two times a day (BID) | ORAL | Status: DC
  Administered 2022-05-15 – 2022-05-17 (×4): 1500 mg via ORAL
  Filled 2022-05-15 (×4): qty 2

## 2022-05-15 MED ORDER — HEPARIN (PORCINE) 25000 UT/250ML-% IV SOLN
1300.0000 [IU]/h | INTRAVENOUS | Status: DC
  Administered 2022-05-15: 1300 [IU]/h via INTRAVENOUS
  Filled 2022-05-15: qty 250

## 2022-05-15 MED ORDER — MOMETASONE FURO-FORMOTEROL FUM 200-5 MCG/ACT IN AERO
2.0000 | INHALATION_SPRAY | Freq: Two times a day (BID) | RESPIRATORY_TRACT | Status: DC
  Administered 2022-05-15 – 2022-05-17 (×5): 2 via RESPIRATORY_TRACT
  Filled 2022-05-15: qty 8.8

## 2022-05-15 MED ORDER — WARFARIN SODIUM 7.5 MG PO TABS
7.5000 mg | ORAL_TABLET | Freq: Once | ORAL | Status: AC
  Administered 2022-05-15: 7.5 mg via ORAL
  Filled 2022-05-15: qty 1

## 2022-05-16 DIAGNOSIS — R569 Unspecified convulsions: Secondary | ICD-10-CM | POA: Diagnosis not present

## 2022-05-16 LAB — BASIC METABOLIC PANEL
Anion gap: 3 — ABNORMAL LOW (ref 5–15)
BUN: 23 mg/dL (ref 8–23)
CO2: 25 mmol/L (ref 22–32)
Chloride: 109 mmol/L (ref 98–111)
Creatinine, Ser: 1.36 mg/dL — ABNORMAL HIGH (ref 0.61–1.24)
GFR, Estimated: 58 mL/min — ABNORMAL LOW (ref >60)
Glucose, Bld: 97 mg/dL (ref 70–99)
Potassium: 4 mmol/L (ref 3.5–5.1)
Sodium: 137 mmol/L (ref 135–145)

## 2022-05-16 LAB — CBC
HCT: 32.1 % — ABNORMAL LOW (ref 39.0–52.0)
Hemoglobin: 9.9 g/dL — ABNORMAL LOW (ref 13.0–17.0)
MCH: 26.1 pg (ref 26.0–34.0)
MCHC: 30.8 g/dL (ref 30.0–36.0)
MCV: 84.5 fL (ref 80.0–100.0)
Platelets: 146 10*3/uL — ABNORMAL LOW (ref 150–400)
RBC: 3.8 MIL/uL — ABNORMAL LOW (ref 4.22–5.81)
WBC: 5.3 10*3/uL (ref 4.0–10.5)
nRBC: 0 % (ref 0.0–0.2)

## 2022-05-16 LAB — GLUCOSE, CAPILLARY
Glucose-Capillary: 104 mg/dL — ABNORMAL HIGH (ref 70–99)
Glucose-Capillary: 88 mg/dL (ref 70–99)
Glucose-Capillary: 116 mg/dL — ABNORMAL HIGH (ref 70–99)
Glucose-Capillary: 94 mg/dL (ref 70–99)
Glucose-Capillary: 103 mg/dL — ABNORMAL HIGH (ref 70–99)
Glucose-Capillary: 106 mg/dL — ABNORMAL HIGH (ref 70–99)
Glucose-Capillary: 113 mg/dL — ABNORMAL HIGH (ref 70–99)

## 2022-05-16 MED ORDER — DIVALPROEX SODIUM 250 MG PO DR TAB
750.0000 mg | DELAYED_RELEASE_TABLET | Freq: Two times a day (BID) | ORAL | Status: DC
  Administered 2022-05-16 – 2022-05-17 (×2): 750 mg via ORAL
  Filled 2022-05-16 (×2): qty 3

## 2022-05-16 MED ORDER — WARFARIN SODIUM 5 MG PO TABS
10.0000 mg | ORAL_TABLET | Freq: Once | ORAL | Status: AC
  Administered 2022-05-16: 10 mg via ORAL
  Filled 2022-05-16: qty 2

## 2022-05-17 ENCOUNTER — Other Ambulatory Visit (HOSPITAL_COMMUNITY): Payer: Self-pay

## 2022-05-17 DIAGNOSIS — R569 Unspecified convulsions: Secondary | ICD-10-CM | POA: Diagnosis not present

## 2022-05-17 LAB — CBC
Hemoglobin: 10.5 g/dL — ABNORMAL LOW (ref 13.0–17.0)
MCH: 26.6 pg (ref 26.0–34.0)
MCV: 84.8 fL (ref 80.0–100.0)
Platelets: 154 10*3/uL (ref 150–400)
RDW: 15.4 % (ref 11.5–15.5)
WBC: 4.5 10*3/uL (ref 4.0–10.5)
nRBC: 0 % (ref 0.0–0.2)

## 2022-05-17 LAB — BASIC METABOLIC PANEL
Anion gap: 4 — ABNORMAL LOW (ref 5–15)
BUN: 20 mg/dL (ref 8–23)
CO2: 27 mmol/L (ref 22–32)
Creatinine, Ser: 1.28 mg/dL — ABNORMAL HIGH (ref 0.61–1.24)
Glucose, Bld: 86 mg/dL (ref 70–99)
Potassium: 4.1 mmol/L (ref 3.5–5.1)

## 2022-05-17 LAB — PROTIME-INR
INR: 1.5 — ABNORMAL HIGH (ref 0.8–1.2)
Prothrombin Time: 17.9 seconds — ABNORMAL HIGH (ref 11.4–15.2)

## 2022-05-17 LAB — MAGNESIUM: Magnesium: 1.7 mg/dL (ref 1.7–2.4)

## 2022-05-17 MED ORDER — WARFARIN SODIUM 7.5 MG PO TABS: 7.5000 mg | ORAL_TABLET | Freq: Every evening | ORAL | Status: DC

## 2022-05-17 MED ORDER — LEVETIRACETAM 750 MG PO TABS
1500.0000 mg | ORAL_TABLET | Freq: Two times a day (BID) | ORAL | 2 refills | Status: DC
  Filled 2022-05-17: qty 120, 30d supply, fill #0

## 2022-05-17 MED ORDER — LOPERAMIDE HCL 2 MG PO CAPS: 2.0000 mg | ORAL_CAPSULE | ORAL | Status: DC | PRN

## 2022-05-17 MED ORDER — LOPERAMIDE HCL 2 MG PO CAPS: 2.0000 mg | ORAL_CAPSULE | ORAL | 0 refills | Status: DC | PRN

## 2022-05-17 MED ORDER — WARFARIN SODIUM 10 MG PO TABS
10.0000 mg | ORAL_TABLET | Freq: Once | ORAL | 0 refills | Status: DC
  Filled 2022-05-17: qty 1, 1d supply, fill #0

## 2022-05-17 MED ORDER — DIVALPROEX SODIUM 250 MG PO DR TAB
750.0000 mg | DELAYED_RELEASE_TABLET | Freq: Two times a day (BID) | ORAL | 2 refills | Status: DC
  Filled 2022-05-17: qty 180, 30d supply, fill #0

## 2022-05-17 MED ORDER — WARFARIN SODIUM 5 MG PO TABS
10.0000 mg | ORAL_TABLET | Freq: Once | ORAL | Status: AC
  Administered 2022-05-17: 10 mg via ORAL
  Filled 2022-05-17: qty 2

## 2022-05-17 MED ORDER — LOPERAMIDE HCL 2 MG PO CAPS
2.0000 mg | ORAL_CAPSULE | Freq: Once | ORAL | Status: AC
  Administered 2022-05-17: 2 mg via ORAL
  Filled 2022-05-17: qty 1

## 2022-05-17 NOTE — Discharge Summary (Addendum)
DISCHARGE SUMMARY  BECKER FERRING  MR#: [id number]  DOB:10-01-1955  Date of Admission: 05/12/2022 Date of Discharge: 05/17/2022  Attending Physician:Keely Drennan Silvestre Gunner, MD  Patient's WUJ:[id number], Chrystal, NP (Inactive)  Consults: PCCM Neurology  Disposition: D/C to group home w/ [facility] support   Follow-up Appts:  Follow-up Information     Gammon, Rushie Chestnut, NP Follow up.   Specialty: Nurse Practitioner Why: You will need to have your INR checked in 2-3 days. Contact information: [address] [place] [address] [phone]                 Tests Needing Follow-up: -assessment of his INR will be needed in the next 2-3 days   Discharge Diagnoses: Status epilepticus with history of seizure disorder Acute respiratory failure related to status epilepticus - resolved AKI on CKD stage IIIa Chronic atrial fibrillation on warfarin HTN with hypertensive urgency at admission HLD Chronic normocytic anemia  Initial presentation: 67 year old group home resident with a history of seizure disorder, chronic atrial fibrillation on warfarin, HTN, HLD, CKD stage IIIa, dementia, prior polysubstance abuse, chronic anemia, and left occipital CVA who was taken to the [facility] ER from his group home with active seizures. He was dosed with Versed via EMS and then Ativan and Keppra and Depakote in the ER. CT head revealed no acute findings. His mental status waxed and waned during his ER stay and then he began to decline consistently and required intubation for airway protection. He was transferred to [facility] for continuous EEG monitoring   Hospital Course: 5/9 presented to [facility] ER with seizure -intubated -transferred to [facility] 5/10 follow-up CT head unrevealing -extubated -continuous EEG stopped 5/12 [facility] assumed care  5/14 stable for D/C to group home with [facility] assistance and rolling walker   Status epilepticus with history of seizure disorder Unclear provocation -no obvious  infection -appears to have been receiving AEDs per group home MAR -UDS without evidence of illicit substances not otherwise explained - Neurology has managed this issue - transitioned all AEDs back to oral dosing -stable at present with no further seizure activity in the lateral portion of hospital stay and patient's mental status having returned to baseline   Acute respiratory insufficiency related to status epilepticus - resolved Required intubation for airway protection -has been successfully extubated and is clinically stable on room air   AKI on CKD stage IIIa Creatinine steadily improved during hospital stay and is stable at approximately 1.3 at time of discharge   Chronic atrial fibrillation on warfarin Transitioned from IV heparin to Lovenox as a bridge while inpatient - DOAC not being used due to interaction with AEDs -will continue Lovenox bridge as inpatient but at time of discharge we will simply allow INR to improve without duplicate coverage given higher fall risk when not in the hospital -the patient will need to have his INR checked in 2-3 days -he will receive 110 mg dose of warfarin on 5/14 but then will resume his usual home warfarin dose  Recent Labs  Lab 05/13/22 0716 05/14/22 0600 05/15/22 0418 05/16/22 0405 05/17/22 0730  INR 2.4* 2.2* 1.5* 1.4* 1.5*    HTN with hypertensive urgency at admission Blood pressure now well-controlled   HLD Continue usual medical therapy   Chronic normocytic anemia Hemoglobin stable at present with no evidence of acute blood loss   Allergies as of 05/17/2022   No Known Allergies      Medication List     STOP taking these medications  aspirin EC 81 MG tablet       TAKE these medications    acetaminophen 325 MG tablet Commonly known as: TYLENOL Take 650 mg by mouth every 4 (four) hours as needed for mild pain or moderate pain.   amLODipine 2.5 MG tablet Commonly known as: NORVASC Take 2.5 mg by mouth daily.    ANTACID PO Take 30 mLs by mouth 4 (four) times daily as needed (heartburn, indigestion). Antacid Suspension   budesonide-formoterol 160-4.5 MCG/ACT inhaler Commonly known as: SYMBICORT Inhale 2 puffs into the lungs 2 (two) times daily.   carvedilol 12.5 MG tablet Commonly known as: COREG Take 12.5 mg by mouth 2 (two) times daily.   diphenhydrAMINE 25 MG tablet Commonly known as: BENADRYL Take 25 mg by mouth every 6 (six) hours as needed for allergies or itching.   divalproex 250 MG DR tablet Commonly known as: DEPAKOTE Take 3 tablets (750 mg total) by mouth 2 (two) times daily. What changed:  medication strength how much to take   docusate sodium 100 MG capsule Commonly known as: COLACE Take 100 mg by mouth at bedtime.   donepezil 5 MG tablet Commonly known as: ARICEPT Take 5 mg by mouth at bedtime.   ergocalciferol 1.25 MG (50000 UT) capsule Commonly known as: VITAMIN D2 Take 50,000 Units by mouth once a week.   eucerin cream Apply 1 Application topically every morning. Apply to skin on legs and feet every morning.   fluocinolone 0.01 % cream Apply 1 application  topically 3 (three) times daily as needed (bug bites/rash).   guaiFENesin 100 MG/5ML liquid Commonly known as: ROBITUSSIN Take 10 mLs by mouth every 6 (six) hours as needed for cough or to loosen phlegm.   levETIRAcetam 750 MG tablet Commonly known as: KEPPRA Take 2 tablets (1,500 mg total) by mouth 2 (two) times daily. What changed:  medication strength how much to take   loperamide 2 MG capsule Commonly known as: IMODIUM Take 2 mg by mouth daily as needed for diarrhea or loose stools. What changed: Another medication with the same name was added. Make sure you understand how and when to take each.   loperamide 2 MG capsule Commonly known as: IMODIUM Take 1 capsule (2 mg total) by mouth as needed for diarrhea or loose stools. What changed: You were already taking a medication with the same name,  and this prescription was added. Make sure you understand how and when to take each.   losartan 50 MG tablet Commonly known as: COZAAR Take 50 mg by mouth daily.   magnesium hydroxide 400 MG/5ML suspension Commonly known as: MILK OF MAGNESIA Take 30 mLs by mouth at bedtime as needed for mild constipation.   NIFEdipine 60 MG 24 hr tablet Commonly known as: PROCARDIA XL/NIFEDICAL XL Take 60 mg by mouth daily.   rosuvastatin 20 MG tablet Commonly known as: CRESTOR Take 20 mg by mouth at bedtime.   torsemide 20 MG tablet Commonly known as: DEMADEX Take 20 mg by mouth daily.   warfarin 10 MG tablet Commonly known as: COUMADIN Take 1 tablet (10 mg total) by mouth one time only at 4 PM. What changed: You were already taking a medication with the same name, and this prescription was added. Make sure you understand how and when to take each.   warfarin 7.5 MG tablet Commonly known as: COUMADIN Take 1 tablet (7.5 mg total) by mouth every evening. Start taking on: May 18, 2022 What changed: Another medication with the same  name was added. Make sure you understand how and when to take each.               Durable Medical Equipment  (From admission, onward)           Start     Ordered   05/16/22 1618  For home use only DME Walker rolling  Once       Question Answer Comment  Walker: With 5 Inch Wheels   Patient needs a walker to treat with the following condition Weakness      05/16/22 1617            Day of Discharge BP (!) 158/99 (BP Location: Left Arm)   Pulse (!) 47   Temp 98.3 F (36.8 C) (Oral)   Resp 18   Ht 6\' 6"  (1.981 m)   Wt 89.7 kg   SpO2 97%   BMI 22.85 kg/m   Physical Exam: General: No acute respiratory distress Lungs: Clear to auscultation bilaterally without wheezes or crackles Cardiovascular: Regular rate and rhythm without murmur gallop or rub normal S1 and S2 Abdomen: Nontender, nondistended, soft, bowel sounds positive, no rebound, no  ascites, no appreciable mass Extremities: No significant cyanosis, clubbing, or edema bilateral lower extremities  Basic Metabolic Panel: Recent Labs  Lab 05/12/22 2006 05/13/22 0716 05/13/22 1749 05/14/22 0600 05/14/22 1009 05/15/22 0418 05/16/22 0405 05/17/22 0730  NA  --  139  --  138 137 137 137 140  K  --  3.5  --  3.9 4.1 3.8 4.0 4.1  CL  --  105  --  104 102 105 109 109  CO2  --  22  --  26 26 26 25 27   GLUCOSE  --  116*  --  94 110* 95 97 86  BUN  --  12  --  24* 22 22 23 20   CREATININE  --  1.30*  --  1.58* 1.57* 1.47* 1.36* 1.28*  CALCIUM  --  9.2  --  8.2* 8.9 8.3* 8.6* 8.9  MG 2.3 2.2 2.3 2.1  --  1.8  --  1.7  PHOS 1.4* 1.8*  1.8* 4.6 3.8  --  2.5  --   --    CBC: Recent Labs  Lab 05/12/22 0901 05/13/22 0716 05/14/22 0600 05/15/22 0418 05/16/22 0405 05/17/22 0730  WBC 4.1 5.5 4.7 4.5 5.3 4.5  NEUTROABS 2.7  --   --   --   --   --   HGB 11.8* 14.2 11.3* 10.8* 9.9* 10.5*  HCT 37.9* 43.6 35.7* 33.6* 32.1* 33.4*  MCV 85.6 81.6 82.4 82.4 84.5 84.8  PLT 183 178 168 153 146* 154    Time spent in discharge (includes decision making & examination of pt): 35 minutes  05/17/2022, 11:23 AM   Lonia Blood, MD [facility] Office  [phone]

## 2022-05-18 MED FILL — Morphine Sulfate Inj 4 MG/ML: INTRAMUSCULAR | Qty: 1 | Status: AC

## 2022-05-18 MED FILL — Fentanyl Citrate Preservative Free (PF) Inj 100 MCG/2ML: INTRAMUSCULAR | Qty: 1 | Status: AC

## 2022-08-16 ENCOUNTER — Ambulatory Visit (INDEPENDENT_AMBULATORY_CARE_PROVIDER_SITE_OTHER): Payer: Medicare Other | Admitting: Podiatry

## 2022-08-16 DIAGNOSIS — M79674 Pain in right toe(s): Secondary | ICD-10-CM

## 2022-08-16 DIAGNOSIS — M79675 Pain in left toe(s): Secondary | ICD-10-CM | POA: Diagnosis not present

## 2022-08-16 DIAGNOSIS — B351 Tinea unguium: Secondary | ICD-10-CM | POA: Diagnosis not present

## 2022-08-16 NOTE — Progress Notes (Signed)
   SUBJECTIVE Patient presents to office today complaining of elongated, thickened nails that cause pain while ambulating in shoes.  Patient is unable to trim their own nails. Patient is here for further evaluation and treatment.  Past Medical History:  Diagnosis Date   CHF (congestive heart failure) (HCC)    CKD (chronic kidney disease) stage 3, GFR 30-59 ml/min (HCC)    Edema    Hyperlipemia    Hypertension    Seizures (HCC)     OBJECTIVE General Patient is awake, alert, and oriented x 3 and in no acute distress. Derm Skin is dry and supple bilateral. Negative open lesions or macerations. Remaining integument unremarkable. Nails are tender, long, thickened and dystrophic with subungual debris, consistent with onychomycosis, 1-5 bilateral. No signs of infection noted.  Hyperkeratotic preulcerative callus tissue also noted to the bilateral feet Vasc  DP and PT pedal pulses palpable bilaterally. Temperature gradient within normal limits.  Neuro Epicritic and protective threshold sensation grossly intact bilaterally.  Musculoskeletal Exam No symptomatic pedal deformities noted bilateral. Muscular strength within normal limits.  ASSESSMENT 1.  Pain due to onychomycosis of toenails both 2.  Preulcerative callus lesions bilateral feet  PLAN OF CARE 1. Patient evaluated today.  2. Instructed to maintain good pedal hygiene and foot care.  3. Mechanical debridement of nails 1-5 bilaterally performed using a nail nipper. Filed with dremel without incident.  4.  Excisional debridement of the hyperkeratotic callus tissue was performed using a tissue nipper without incident or bleeding Return to clinic in 3 mos. for routine foot care

## 2022-10-22 ENCOUNTER — Emergency Department: Payer: Medicare Other

## 2022-10-22 ENCOUNTER — Inpatient Hospital Stay
Admission: EM | Admit: 2022-10-22 | Discharge: 2022-10-24 | DRG: 101 | Disposition: A | Discharge status: Home / Self Care | Payer: Medicare Other | Attending: Internal Medicine | Admitting: Internal Medicine

## 2022-10-22 ENCOUNTER — Other Ambulatory Visit: Payer: Self-pay

## 2022-10-22 DIAGNOSIS — Z7982 Long term (current) use of aspirin: Secondary | ICD-10-CM | POA: Diagnosis not present

## 2022-10-22 DIAGNOSIS — F0390 Unspecified dementia without behavioral disturbance: Secondary | ICD-10-CM | POA: Diagnosis present

## 2022-10-22 DIAGNOSIS — G40909 Epilepsy, unspecified, not intractable, without status epilepticus: Secondary | ICD-10-CM | POA: Diagnosis present

## 2022-10-22 DIAGNOSIS — I2510 Atherosclerotic heart disease of native coronary artery without angina pectoris: Secondary | ICD-10-CM | POA: Diagnosis present

## 2022-10-22 DIAGNOSIS — Z7901 Long term (current) use of anticoagulants: Secondary | ICD-10-CM

## 2022-10-22 DIAGNOSIS — G9341 Metabolic encephalopathy: Secondary | ICD-10-CM | POA: Diagnosis present

## 2022-10-22 DIAGNOSIS — I482 Chronic atrial fibrillation, unspecified: Secondary | ICD-10-CM | POA: Diagnosis present

## 2022-10-22 DIAGNOSIS — Z880 Allergy status to penicillin: Secondary | ICD-10-CM

## 2022-10-22 DIAGNOSIS — Z79899 Other long term (current) drug therapy: Secondary | ICD-10-CM

## 2022-10-22 DIAGNOSIS — G40919 Epilepsy, unspecified, intractable, without status epilepticus: Secondary | ICD-10-CM | POA: Diagnosis present

## 2022-10-22 DIAGNOSIS — N1831 Chronic kidney disease, stage 3a: Secondary | ICD-10-CM | POA: Diagnosis present

## 2022-10-22 DIAGNOSIS — J449 Chronic obstructive pulmonary disease, unspecified: Secondary | ICD-10-CM | POA: Diagnosis present

## 2022-10-22 DIAGNOSIS — E785 Hyperlipidemia, unspecified: Secondary | ICD-10-CM | POA: Diagnosis present

## 2022-10-22 DIAGNOSIS — I130 Hypertensive heart and chronic kidney disease with heart failure and stage 1 through stage 4 chronic kidney disease, or unspecified chronic kidney disease: Secondary | ICD-10-CM | POA: Diagnosis present

## 2022-10-22 DIAGNOSIS — D631 Anemia in chronic kidney disease: Secondary | ICD-10-CM | POA: Diagnosis present

## 2022-10-22 DIAGNOSIS — R569 Unspecified convulsions: Secondary | ICD-10-CM | POA: Diagnosis not present

## 2022-10-22 DIAGNOSIS — Z8673 Personal history of transient ischemic attack (TIA), and cerebral infarction without residual deficits: Secondary | ICD-10-CM

## 2022-10-22 LAB — COMPREHENSIVE METABOLIC PANEL
ALT: 15 U/L (ref 0–44)
AST: 17 U/L (ref 15–41)
Albumin: 4.2 g/dL (ref 3.5–5.0)
Alkaline Phosphatase: 64 U/L (ref 38–126)
Anion gap: 8 (ref 5–15)
BUN: 22 mg/dL (ref 8–23)
CO2: 30 mmol/L (ref 22–32)
Calcium: 9.2 mg/dL (ref 8.9–10.3)
Chloride: 102 mmol/L (ref 98–111)
Creatinine, Ser: 1.68 mg/dL — ABNORMAL HIGH (ref 0.61–1.24)
GFR, Estimated: 45 mL/min — ABNORMAL LOW (ref >60)
Glucose, Bld: 131 mg/dL — ABNORMAL HIGH (ref 70–99)
Potassium: 3.9 mmol/L (ref 3.5–5.1)
Sodium: 140 mmol/L (ref 135–145)
Total Bilirubin: 0.7 mg/dL (ref 0.3–1.2)
Total Protein: 8.9 g/dL — ABNORMAL HIGH (ref 6.5–8.1)

## 2022-10-22 LAB — URINALYSIS, ROUTINE W REFLEX MICROSCOPIC
Bacteria, UA: NONE SEEN
Bilirubin Urine: NEGATIVE
Glucose, UA: NEGATIVE mg/dL
Hgb urine dipstick: NEGATIVE
Ketones, ur: NEGATIVE mg/dL
Leukocytes,Ua: NEGATIVE
Nitrite: NEGATIVE
Protein, ur: 30 mg/dL — AB
Specific Gravity, Urine: 1.012 (ref 1.005–1.030)
Squamous Epithelial / HPF: 0 /[HPF] (ref 0–5)
pH: 6 (ref 5.0–8.0)

## 2022-10-22 LAB — URINE DRUG SCREEN, QUALITATIVE (ARMC ONLY)
Amphetamines, Ur Screen: NOT DETECTED
Barbiturates, Ur Screen: NOT DETECTED
Benzodiazepine, Ur Scrn: POSITIVE — AB
Cannabinoid 50 Ng, Ur ~~LOC~~: NOT DETECTED
Cocaine Metabolite,Ur ~~LOC~~: NOT DETECTED
MDMA (Ecstasy)Ur Screen: NOT DETECTED
Methadone Scn, Ur: NOT DETECTED
Opiate, Ur Screen: NOT DETECTED
Phencyclidine (PCP) Ur S: NOT DETECTED
Tricyclic, Ur Screen: NOT DETECTED

## 2022-10-22 LAB — CBC WITH DIFFERENTIAL/PLATELET
Abs Immature Granulocytes: 0.06 10*3/uL (ref 0.00–0.07)
Basophils Absolute: 0 10*3/uL (ref 0.0–0.1)
Basophils Relative: 1 %
Eosinophils Absolute: 0 10*3/uL (ref 0.0–0.5)
Eosinophils Relative: 0 %
HCT: 43.3 % (ref 39.0–52.0)
Hemoglobin: 13.2 g/dL (ref 13.0–17.0)
Immature Granulocytes: 1 %
Lymphocytes Relative: 12 %
Lymphs Abs: 0.9 10*3/uL (ref 0.7–4.0)
MCH: 26 pg (ref 26.0–34.0)
MCHC: 30.5 g/dL (ref 30.0–36.0)
MCV: 85.4 fL (ref 80.0–100.0)
Monocytes Absolute: 0.3 10*3/uL (ref 0.1–1.0)
Monocytes Relative: 4 %
Neutro Abs: 6.5 10*3/uL (ref 1.7–7.7)
Neutrophils Relative %: 82 %
Platelets: 207 10*3/uL (ref 150–400)
RBC: 5.07 MIL/uL (ref 4.22–5.81)
RDW: 15.6 % — ABNORMAL HIGH (ref 11.5–15.5)
WBC: 7.8 10*3/uL (ref 4.0–10.5)
nRBC: 0 % (ref 0.0–0.2)

## 2022-10-22 LAB — MAGNESIUM: Magnesium: 1.9 mg/dL (ref 1.7–2.4)

## 2022-10-22 LAB — PROTIME-INR
INR: 2.2 — ABNORMAL HIGH (ref 0.8–1.2)
Prothrombin Time: 24.7 s — ABNORMAL HIGH (ref 11.4–15.2)

## 2022-10-22 LAB — VALPROIC ACID LEVEL: Valproic Acid Lvl: 61 ug/mL (ref 50.0–100.0)

## 2022-10-22 MED ORDER — ACETAMINOPHEN 325 MG PO TABS: 650.0000 mg | ORAL_TABLET | ORAL | Status: DC | PRN

## 2022-10-22 MED ORDER — DONEPEZIL HCL 5 MG PO TABS
5.0000 mg | ORAL_TABLET | Freq: Every day | ORAL | Status: DC
  Administered 2022-10-22 – 2022-10-23 (×2): 5 mg via ORAL
  Filled 2022-10-22 (×2): qty 1

## 2022-10-22 MED ORDER — ROSUVASTATIN CALCIUM 20 MG PO TABS
20.0000 mg | ORAL_TABLET | Freq: Every day | ORAL | Status: DC
  Administered 2022-10-22 – 2022-10-23 (×2): 20 mg via ORAL
  Filled 2022-10-22 (×2): qty 1

## 2022-10-22 MED ORDER — SODIUM CHLORIDE 0.9 % IV SOLN
75.0000 mL/h | INTRAVENOUS | Status: DC
  Administered 2022-10-22: 75 mL/h via INTRAVENOUS

## 2022-10-22 MED ORDER — NIFEDIPINE ER 60 MG PO TB24
60.0000 mg | ORAL_TABLET | Freq: Every day | ORAL | Status: DC
  Administered 2022-10-22 – 2022-10-24 (×3): 60 mg via ORAL
  Filled 2022-10-22 (×3): qty 1

## 2022-10-22 MED ORDER — WARFARIN SODIUM 7.5 MG PO TABS
7.5000 mg | ORAL_TABLET | Freq: Once | ORAL | Status: AC
  Administered 2022-10-22: 7.5 mg via ORAL
  Filled 2022-10-22: qty 1

## 2022-10-22 MED ORDER — ONDANSETRON HCL 4 MG PO TABS: 4.0000 mg | ORAL_TABLET | Freq: Four times a day (QID) | ORAL | Status: DC | PRN

## 2022-10-22 MED ORDER — LABETALOL HCL 5 MG/ML IV SOLN
20.0000 mg | Freq: Once | INTRAVENOUS | Status: AC
  Administered 2022-10-22: 20 mg via INTRAVENOUS
  Filled 2022-10-22: qty 4

## 2022-10-22 MED ORDER — ORAL CARE MOUTH RINSE: 15.0000 mL | OROMUCOSAL | Status: DC | PRN

## 2022-10-22 MED ORDER — LOSARTAN POTASSIUM 50 MG PO TABS
50.0000 mg | ORAL_TABLET | Freq: Every day | ORAL | Status: DC
  Administered 2022-10-22 – 2022-10-24 (×3): 50 mg via ORAL
  Filled 2022-10-22 (×3): qty 1

## 2022-10-22 MED ORDER — ORAL CARE MOUTH RINSE
15.0000 mL | OROMUCOSAL | Status: DC
  Administered 2022-10-22 – 2022-10-23 (×17): 15 mL via OROMUCOSAL
  Filled 2022-10-22 (×26): qty 15

## 2022-10-22 MED ORDER — ONDANSETRON HCL 4 MG/2ML IJ SOLN: 4.0000 mg | Freq: Four times a day (QID) | INTRAMUSCULAR | Status: DC | PRN

## 2022-10-22 MED ORDER — LORAZEPAM 2 MG/ML IJ SOLN: 4.0000 mg | INTRAMUSCULAR | Status: DC | PRN

## 2022-10-22 MED ORDER — LEVETIRACETAM 500 MG PO TABS
1000.0000 mg | ORAL_TABLET | Freq: Two times a day (BID) | ORAL | Status: DC
  Administered 2022-10-22 – 2022-10-24 (×5): 1000 mg via ORAL
  Filled 2022-10-22 (×5): qty 2

## 2022-10-22 MED ORDER — MAGNESIUM HYDROXIDE 400 MG/5ML PO SUSP: 30.0000 mL | Freq: Every evening | ORAL | Status: DC | PRN

## 2022-10-22 MED ORDER — LEVETIRACETAM IN NACL 1500 MG/100ML IV SOLN
1500.0000 mg | Freq: Once | INTRAVENOUS | Status: AC
  Administered 2022-10-22: 1500 mg via INTRAVENOUS
  Filled 2022-10-22: qty 100

## 2022-10-22 MED ORDER — AMLODIPINE BESYLATE 5 MG PO TABS: 2.5000 mg | ORAL_TABLET | Freq: Every day | ORAL | Status: DC

## 2022-10-22 MED ORDER — DIPHENHYDRAMINE HCL 25 MG PO CAPS: 25.0000 mg | ORAL_CAPSULE | Freq: Four times a day (QID) | ORAL | Status: DC | PRN

## 2022-10-22 MED ORDER — TORSEMIDE 20 MG PO TABS
20.0000 mg | ORAL_TABLET | Freq: Every day | ORAL | Status: DC
  Administered 2022-10-22 – 2022-10-24 (×3): 20 mg via ORAL
  Filled 2022-10-22 (×3): qty 1

## 2022-10-22 MED ORDER — CARVEDILOL 6.25 MG PO TABS
12.5000 mg | ORAL_TABLET | Freq: Two times a day (BID) | ORAL | Status: DC
  Administered 2022-10-22 – 2022-10-24 (×4): 12.5 mg via ORAL
  Filled 2022-10-22 (×4): qty 2

## 2022-10-22 MED ORDER — ASPIRIN 81 MG PO TBEC
81.0000 mg | DELAYED_RELEASE_TABLET | Freq: Every day | ORAL | Status: DC
  Administered 2022-10-22: 81 mg via ORAL
  Filled 2022-10-22 (×2): qty 1

## 2022-10-22 MED ORDER — GUAIFENESIN 100 MG/5ML PO LIQD: 10.0000 mL | Freq: Four times a day (QID) | ORAL | Status: DC | PRN

## 2022-10-22 MED ORDER — WARFARIN - PHARMACIST DOSING INPATIENT
Freq: Every day | Status: DC
  Filled 2022-10-22: qty 1

## 2022-10-22 MED ORDER — DIVALPROEX SODIUM 500 MG PO DR TAB
750.0000 mg | DELAYED_RELEASE_TABLET | Freq: Two times a day (BID) | ORAL | Status: DC
  Administered 2022-10-22 (×2): 750 mg via ORAL
  Filled 2022-10-22 (×3): qty 1

## 2022-10-22 MED ORDER — ACETAMINOPHEN 325 MG RE SUPP: 650.0000 mg | RECTAL | Status: DC | PRN

## 2022-10-22 MED ORDER — HYDRALAZINE HCL 20 MG/ML IJ SOLN
5.0000 mg | Freq: Four times a day (QID) | INTRAMUSCULAR | Status: DC | PRN
  Administered 2022-10-22 (×2): 5 mg via INTRAVENOUS
  Filled 2022-10-22 (×2): qty 1

## 2022-10-22 NOTE — ED Provider Notes (Signed)
[facility] Provider Note    Event Date/Time   First MD Initiated Contact with Patient 10/22/22 [phone]     (approximate)   History   Seizures (Brought in by medic from group home with seizure activity. Group home states he had one witnessed seizure with them and 2 en route. Medic states patient became stiff with twitching BUE while head faces right. History of seizures, dementia, and previous stoke. Group home: [facility]. Patient presented in post ictal state.  )   HPI  Nathan Shepherd is a 67 y.o. male who presents to the ED for evaluation of Seizures (Brought in by medic from group home with seizure activity. Group home states he had one witnessed seizure with them and 2 en route. Medic states patient became stiff with twitching BUE while head faces right. History of seizures, dementia, and previous stoke. Group home: [facility]. Patient presented in post ictal state.  )   I reviewed medical DC summary from May.  Presented to our facility for seizure, required intubation and transferred to [facility] for continuous EEG. He has a history of seizures, chronic A-fib on warfarin, CKD, polysubstance abuse, previous CVA and resides at a local group home.  Follow-up today was also for seizure.  Very poor historians at the group home, per EMS.  Sounds like he had a seizure at the group home and 2 or 3 with EMS abated with 5 mg of intramuscular Versed.  He presents to the ED somnolent and postictal.  EMS reports the seizures with them or eyes, head and neck with rightward deviation and "tight on the right."  Lasting seconds in duration.    Physical Exam   Triage Vital Signs: ED Triage Vitals  Encounter Vitals Group     BP      Systolic BP Percentile      Diastolic BP Percentile      Pulse      Resp      Temp      Temp src      SpO2      Weight      Height      Head Circumference      Peak Flow      Pain  Score      Pain Loc      Pain Education      Exclude from Growth Chart     Most recent vital signs: Vitals:   10/22/22 0615 10/22/22 0630  BP:    Pulse: 74 83  Resp: (!) 24 (!) 25  Temp:  98 F (36.7 C)  SpO2: 94% 94%    General: , no distress.  Somnolent, slowly improving throughout his ED stay.  Ultimately he opened his eyes and looks at me with vocal stimulation.  Does not follow any commands. CV:  Good peripheral perfusion.  Resp:  Normal effort.  Abd:  No distention.  Soft and benign MSK:  No deformity noted.  No signs of trauma Neuro:  No focal deficits appreciated.  Moving all 4 extremities when he repositions in bed without clear deficit.  Not clearly following commands. Other:     ED Results / Procedures / Treatments   Labs (all labs ordered are listed, but only abnormal results are displayed) Labs Reviewed  PROTIME-INR - Abnormal; Notable for the following components:      Result Value   Prothrombin Time 24.7 (*)    INR 2.2 (*)  All other components within normal limits  COMPREHENSIVE METABOLIC PANEL - Abnormal; Notable for the following components:   Glucose, Bld 131 (*)    Creatinine, Ser 1.68 (*)    Total Protein 8.9 (*)    GFR, Estimated 45 (*)    All other components within normal limits  CBC WITH DIFFERENTIAL/PLATELET - Abnormal; Notable for the following components:   RDW 15.6 (*)    All other components within normal limits  URINALYSIS, ROUTINE W REFLEX MICROSCOPIC - Abnormal; Notable for the following components:   Color, Urine STRAW (*)    APPearance CLEAR (*)    Protein, ur 30 (*)    All other components within normal limits  URINE DRUG SCREEN, QUALITATIVE ([facility] ONLY) - Abnormal; Notable for the following components:   Benzodiazepine, Ur Scrn POSITIVE (*)    All other components within normal limits  VALPROIC ACID LEVEL  MAGNESIUM  LEVETIRACETAM LEVEL    EKG   RADIOLOGY CXR interpreted by me without evidence of acute cardiopulmonary  pathology. CT head interpreted by me without evidence of acute intracranial pathology  Official radiology report(s): DG Chest Portable 1 View  Result Date: 10/22/2022 CLINICAL DATA:  Recurrent seizure EXAM: PORTABLE CHEST 1 VIEW COMPARISON:  08/22/2021 FINDINGS: Cardiomegaly with vascular pedicle widening and aortic tortuosity. There is no edema, consolidation, effusion, or pneumothorax. IMPRESSION: Cardiomegaly without failure.  No acute finding. Electronically Signed   By: Tiburcio Pea M.D.   On: 10/22/2022 04:14   CT HEAD WO CONTRAST ( )  Result Date: 10/22/2022 CLINICAL DATA:  Breakthrough seizure EXAM: CT HEAD WITHOUT CONTRAST TECHNIQUE: Contiguous axial images were obtained from the base of the skull through the vertex without intravenous contrast. RADIATION DOSE REDUCTION: This exam was performed according to the departmental dose-optimization program which includes automated exposure control, adjustment of the mA and/or kV according to patient size and/or use of iterative reconstruction technique. COMPARISON:  Brain MRI 08/22/2021 FINDINGS: Brain: No evidence of acute infarction, hemorrhage, hydrocephalus, extra-axial collection or mass lesion/mass effect. Chronic left occipital infarct, PCA distribution. Small vessel ischemic gliosis in the cerebral white matter with chronic lacunar infarct at the left thalamus. Vascular: No hyperdense vessel or unexpected calcification. Skull: Normal. Negative for fracture or focal lesion. Sinuses/Orbits: Patchy bilateral paranasal sinus opacification, confluent in the right maxillary and bifrontal sinuses with polypoid densities in the bilateral nasal cavity. IMPRESSION: 1. No acute finding. 2. 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 Electronically Signed   By: Tiburcio Pea M.D.   On: 10/22/2022 04:00    PROCEDURES and INTERVENTIONS:  .1-3 Lead EKG Interpretation  Performed by: Delton Prairie, MD Authorized by: Delton Prairie, MD      ECG rate:  80   ECG rate assessment: normal     Rhythm: sinus rhythm     Ectopy: none     Conduction: normal   .Critical Care  Performed by: Delton Prairie, MD Authorized by: Delton Prairie, MD   Critical care provider statement:    Critical care time (minutes):  30   Critical care time was exclusive of:  Separately billable procedures and treating other patients   Critical care was necessary to treat or prevent imminent or life-threatening deterioration of the following conditions:  CNS failure or compromise   Critical care was time spent personally by me on the following activities:  Development of treatment plan with patient or surrogate, discussions with consultants, evaluation of patient's response to treatment, examination of patient, ordering and review of laboratory studies,  ordering and review of radiographic studies, ordering and performing treatments and interventions, pulse oximetry, re-evaluation of patient's condition and review of old charts   Medications  levETIRAcetam (KEPPRA) IVPB 1500 mg/ 100 mL premix (0 mg Intravenous Stopped 10/22/22 0326)     IMPRESSION / MDM / ASSESSMENT AND PLAN / ED COURSE  I reviewed the triage vital signs and the nursing notes.  Differential diagnosis includes, but is not limited to, status epilepticus, ICH, seizure, stroke patient side effect  {Patient presents with symptoms of an acute illness or injury that is potentially life-threatening.  Patient presents to the ED from his group home after having multiple seizures, resolving with prehospital benzodiazepines.  He presents postictal and seem to be improving throughout his ED stay.  No recurrence of seizures.   Has a benign workup with clear CT head and CXR.  Blood work with CKD, therapeutic INR, normal CBC and metabolic panel.  Valproic acid is therapeutic, awaiting Keppra level.  He is signed out to oncoming provider to follow-up on his mental status with a citation that he will  continue to improve and hopefully go back to his group home.  If he worsens, has another seizure he would likely require transfer for EEG services.  Clinical Course as of 10/22/22 0709  Sat Oct 22, 2022  0302 Reassessed. No further seizures. Nurses working on IV access [DS]  [employer], seems to be coming around some, more responsive than initial presentation.  No further seizure activity noted [DS]  0502 Reassessed, more awake, seems to be ripping off the leads and tried to take his pants off to void.  After leave the room to find a urinal, by the time I get back he has voided on himself.  Still not seen anything.  No seizure activity, all seemed volitional [DS]  0621 Reassessed, more awake and grumbling at me. Uncertain exactly what his baseline is. I asked nursing staff to reach out to his facility to get a better idea of his normal [DS]  0708 Reassessed, seems to be slowly improving.  He opens his eyes and looks at me when I say his name.  Clears his throat and then closes eyes and go back to sleep.  Has not said anything [DS]    Clinical Course User Index [DS] Delton Prairie, MD     FINAL CLINICAL IMPRESSION(S) / ED DIAGNOSES   Final diagnoses:  Seizure (HCC)     Rx / DC Orders   ED Discharge Orders     None        Note:  This document was prepared using Dragon voice recognition software and may include unintentional dictation errors.   Delton Prairie, MD 10/22/22 [phone]

## 2022-10-22 NOTE — Consult Note (Signed)
PHARMACY - ANTICOAGULATION CONSULT NOTE  Pharmacy Consult for warfarin Indication: atrial fibrillation  Allergies  Allergen Reactions   Penicillins Hives    Tolerated Zosyn    Patient Measurements: Height: 6\' 6"  (198.1 cm)   Vital Signs: Temp: 98.1 F (36.7 C) (10/19 1023) Temp Source: Oral (10/19 1023) BP: 173/121 (10/19 1045) Pulse Rate: 65 (10/19 1045)  Labs: Recent Labs    10/22/22 0304  HGB 13.2  HCT 43.3  PLT 207  LABPROT 24.7*  INR 2.2*  CREATININE 1.68*    CrCl cannot be calculated (Unknown ideal weight.).   Medical History: Past Medical History:  Diagnosis Date   CHF (congestive heart failure) (HCC)    CKD (chronic kidney disease) stage 3, GFR 30-59 ml/min (HCC)    Edema    Hyperlipemia    Hypertension    Seizures (HCC)      Assessment: Patient is a 67 y.o. male with a PMH of chronic A-fib on warfarin, HTN, HLD, seizure disorder, CKD stage IIIa, dementia, and CVA. Baseline INR today is 2.2. Hgb 13.2, plts 207. No signs or symptoms of bleeding noted.   Home dose of warfarin: 7.5 mg by mouth daily at home. Last dose was 10/18 at 1700.  - patient on aspirin at home. Will continue inpatient  - PTA Depakote increased from 500 mg BID to 750 mg BID due to breakthrough seizure. Depakote can increase the concentration of warfarin and increase the risk of bleeding. INR may increase as a result of this change in therapy   Goal of Therapy:  INR 2-3 Monitor platelets by anticoagulation protocol: Yes   Plan:  Given INR is within goal, will give home dose of 7.5 mg x1 Daily INR  CBC at least every 3 days  Monitoring for signs and symptoms of bleeding   Karlton Lemon, PharmD Candidate  10/22/2022,12:24 PM

## 2022-10-22 NOTE — ED Notes (Signed)
Spoke with Leodis Rains. (Admin) The patient baseline is independent, speaks clearly.  No deficits from previous stroke.  Admin the patient has history of dementia - slightly forget at times.  Patient is incontinent at times.  Denies the use of cane or walker.  Patient feeds and bathes independently. History of HTN at times.  Takes medications whole, thin liquids. Denies the patient has behaviors.   Soft spoken.  Joycelyn Das: [phone] Onalee Hua: [phone] Madlyn Frankel)  Facility: [facility]. [address] [place], [place] [address]: [phone] - call this number to give report when patient is discharge.   Supervisor in Charge (SIC): Ellis Savage- [phone]

## 2022-10-22 NOTE — H&P (Signed)
History and Physical    Nathan Shepherd:[id number] DOB: [birth year] DOA: 10/22/2022  PCP: Rush Farmer, NP (Confirm with patient/family/NH records and if not entered, this has to be entered at [facility] point of entry) Patient coming from: Group home  I have personally briefly reviewed patient's old medical records in [facility]  Chief Complaint: Seizure  HPI: Nathan Shepherd is a 67 y.o. male with medical history significant of seizure disorder, chronic A-fib on Coumadin, HTN, HLD, CKD stage IIIa, dementia, chronic anemia secondary to CKD, previous polysubstance abuse, sent from group home for evaluation of seizure.  Group home staff reported that the patient had 1 witnessed tonic-clonic seizure this morning.  EMS called in and en route to [facility] patient had another 2 episodes of seizure and was given Versed and seizure broke.  Patient remained confused and agitated in the ED likely postictal.  Unable to tell me any information but denied any pain no shortness breath.  Review of Systems: Unable to perform, patient is postictal and confused  Past Medical History:  Diagnosis Date   CHF (congestive heart failure) (HCC)    CKD (chronic kidney disease) stage 3, GFR 30-59 ml/min (HCC)    Edema    Hyperlipemia    Hypertension    Seizures (HCC)     History reviewed. No pertinent surgical history.   reports that he does not have a smoking history on file. He has never used smokeless tobacco. No history on file for alcohol use and drug use.  Allergies  Allergen Reactions   Penicillins Hives    Tolerated Zosyn    History reviewed. No pertinent family history.   Prior to Admission medications   Medication Sig Start Date End Date Taking? Authorizing Provider  acetaminophen (TYLENOL) 325 MG tablet Take 650 mg by mouth every 4 (four) hours as needed for mild pain (pain score 1-3).   Yes [provider]  amLODipine (NORVASC) 2.5 MG tablet Take 1 tablet (2.5 mg  total) by mouth daily. 08/28/21  Yes Marinda Elk, MD  aspirin EC 81 MG tablet Take 81 mg by mouth daily.   Yes [provider]  Calcium Carbonate Antacid (ANTACID PO) Take 30 mLs by mouth 4 (four) times daily as needed (heartburn, indigestion). Antacid Suspension   Yes [provider]  carvedilol (COREG) 12.5 MG tablet Take 12.5 mg by mouth 2 (two) times daily with a meal.   Yes [provider]  diphenhydrAMINE (BENADRYL) 25 MG tablet Take 25 mg by mouth every 6 (six) hours as needed for allergies or itching.   Yes [provider]  divalproex (DEPAKOTE) 500 MG DR tablet Take 1 tablet (500 mg total) by mouth every 12 (twelve) hours. 08/28/21  Yes Marinda Elk, MD  docusate sodium (COLACE) 100 MG capsule Take 100 mg by mouth at bedtime.   Yes [provider]  donepezil (ARICEPT) 5 MG tablet Take 5 mg by mouth at bedtime.   Yes [provider]  Emollient (EUCERIN) lotion Apply 1 Application topically daily. (Apply to legs and feet)   Yes [provider]  fluocinolone 0.01 % cream Apply 1 application  topically 3 (three) times daily as needed (bug bites/rash).   Yes [provider]  guaiFENesin (ROBITUSSIN) 100 MG/5ML liquid Take 10 mLs by mouth every 6 (six) hours as needed for cough or to loosen phlegm.   Yes [provider]  levETIRAcetam (KEPPRA) 1000 MG tablet Take 1,000 mg by mouth 2 (two)  times daily.   Yes [provider]  loperamide (IMODIUM) 2 MG capsule Take 1 capsule (2 mg total) by mouth as needed for diarrhea or loose stools. Patient taking differently: Take 2 mg by mouth as needed for diarrhea or loose stools. (Max 8 doses in 24 hours) 05/17/22  Yes Lonia Blood, MD  losartan (COZAAR) 50 MG tablet Take 50 mg by mouth daily.   Yes [provider]  magnesium hydroxide (MILK OF MAGNESIA) 400 MG/5ML suspension Take 30 mLs by mouth at bedtime as needed for mild constipation.    Yes [provider]  NIFEdipine (PROCARDIA XL/NIFEDICAL XL) 60 MG 24 hr tablet Take 60 mg by mouth daily.   Yes [provider]  rosuvastatin (CRESTOR) 20 MG tablet Take 20 mg by mouth at bedtime.    Yes [provider]  torsemide (DEMADEX) 20 MG tablet Take 20 mg by mouth daily.   Yes [provider]  Vitamin D, Ergocalciferol, (DRISDOL) 1.25 MG (50000 UNIT) CAPS capsule Take 50,000 Units by mouth every Monday.   Yes [provider]  warfarin (COUMADIN) 7.5 MG tablet Take 1 tablet (7.5 mg total) by mouth every evening. 05/18/22  Yes Lonia Blood, MD  budesonide-formoterol [facility]) 160-4.5 MCG/ACT inhaler Inhale 2 puffs into the lungs in the morning and at bedtime. Patient not taking: Reported on 10/22/2022    [provider]  divalproex (DEPAKOTE) 250 MG DR tablet Take 3 tablets (750 mg total) by mouth 2 (two) times daily. Patient not taking: Reported on 10/22/2022 05/17/22   Lonia Blood, MD  levETIRAcetam (KEPPRA) 750 MG tablet Take 2 tablets (1,500 mg total) by mouth 2 (two) times daily. Patient not taking: Reported on 10/22/2022 05/17/22   Lonia Blood, MD  warfarin (COUMADIN) 10 MG tablet Take 1 tablet (10 mg total) by mouth one time only at 4 PM. Patient not taking: Reported on 10/22/2022 05/17/22   Lonia Blood, MD    Physical Exam: Vitals:   10/22/22 1015 10/22/22 1023 10/22/22 1030 10/22/22 1045  BP: (!) 193/116  (!) 182/103 (!) 173/121  Pulse: 61  72 65  Resp: 18  17 19   Temp:  98.1 F (36.7 C)    TempSrc:  Oral    SpO2: 99%  100% 99%    Constitutional: NAD, calm, comfortable Vitals:   10/22/22 1015 10/22/22 1023 10/22/22 1030 10/22/22 1045  BP: (!) 193/116  (!) 182/103 (!) 173/121  Pulse: 61  72 65  Resp: 18  17 19   Temp:  98.1 F (36.7 C)    TempSrc:  Oral    SpO2: 99%  100% 99%   Eyes: PERRL, lids and conjunctivae normal ENMT: Mucous membranes are moist. Posterior pharynx clear of any  exudate or lesions.Normal dentition.  Neck: normal, supple, no masses, no thyromegaly Respiratory: clear to auscultation bilaterally, no wheezing, no crackles. Normal respiratory effort. No accessory muscle use.  Cardiovascular: Regular rate and rhythm, no murmurs / rubs / gallops. No extremity edema. 2+ pedal pulses. No carotid bruits.  Abdomen: no tenderness, no masses palpated. No hepatosplenomegaly. Bowel sounds positive.  Musculoskeletal: no clubbing / cyanosis. No joint deformity upper and lower extremities. Good ROM, no contractures. Normal muscle tone.  Skin: no rashes, lesions, ulcers. No induration Neurologic: No facial droops, moving all limbs, not following commands Psychiatric: Awake, confused   Labs on Admission: I have personally reviewed following labs and imaging studies  CBC: Recent Labs  Lab 10/22/22 0304  WBC 7.8  NEUTROABS 6.5  HGB 13.2  HCT 43.3  MCV 85.4  PLT 207   Basic Metabolic Panel: Recent Labs  Lab 10/22/22 0304  NA 140  K 3.9  CL 102  CO2 30  GLUCOSE 131*  BUN 22  CREATININE 1.68*  CALCIUM 9.2  MG 1.9   GFR: CrCl cannot be calculated (Unknown ideal weight.). Liver Function Tests: Recent Labs  Lab 10/22/22 0304  AST 17  ALT 15  ALKPHOS 64  BILITOT 0.7  PROT 8.9*  ALBUMIN 4.2   No results for input(s): "LIPASE", "AMYLASE" in the last 168 hours. No results for input(s): "AMMONIA" in the last 168 hours. Coagulation Profile: Recent Labs  Lab 10/22/22 0304  INR 2.2*   Cardiac Enzymes: No results for input(s): "CKTOTAL", "CKMB", "CKMBINDEX", "TROPONINI" in the last 168 hours. BNP (last 3 results) No results for input(s): "PROBNP" in the last 8760 hours. HbA1C: No results for input(s): "HGBA1C" in the last 72 hours. CBG: No results for input(s): "GLUCAP" in the last 168 hours. Lipid Profile: No results for input(s): "CHOL", "HDL", "LDLCALC", "TRIG", "CHOLHDL", "LDLDIRECT" in the last 72 hours. Thyroid Function Tests: No  results for input(s): "TSH", "T4TOTAL", "FREET4", "T3FREE", "THYROIDAB" in the last 72 hours. Anemia Panel: No results for input(s): "VITAMINB12", "FOLATE", "FERRITIN", "TIBC", "IRON", "RETICCTPCT" in the last 72 hours. Urine analysis:    Component Value Date/Time   COLORURINE STRAW (A) 10/22/2022 0304   APPEARANCEUR CLEAR (A) 10/22/2022 0304   LABSPEC 1.012 10/22/2022 0304   PHURINE 6.0 10/22/2022 0304   GLUCOSEU NEGATIVE 10/22/2022 0304   HGBUR NEGATIVE 10/22/2022 0304   BILIRUBINUR NEGATIVE 10/22/2022 0304   KETONESUR NEGATIVE 10/22/2022 0304   PROTEINUR 30 (A) 10/22/2022 0304   NITRITE NEGATIVE 10/22/2022 0304   LEUKOCYTESUR NEGATIVE 10/22/2022 0304    Radiological Exams on Admission: DG Chest Portable 1 View  Result Date: 10/22/2022 CLINICAL DATA:  Recurrent seizure EXAM: PORTABLE CHEST 1 VIEW COMPARISON:  08/22/2021 FINDINGS: Cardiomegaly with vascular pedicle widening and aortic tortuosity. There is no edema, consolidation, effusion, or pneumothorax. IMPRESSION: Cardiomegaly without failure.  No acute finding. Electronically Signed   By: Tiburcio Pea M.D.   On: 10/22/2022 04:14   CT HEAD WO CONTRAST ( )  Result Date: 10/22/2022 CLINICAL DATA:  Breakthrough seizure EXAM: CT HEAD WITHOUT CONTRAST TECHNIQUE: Contiguous axial images were obtained from the base of the skull through the vertex without intravenous contrast. RADIATION DOSE REDUCTION: This exam was performed according to the departmental dose-optimization program which includes automated exposure control, adjustment of the mA and/or kV according to patient size and/or use of iterative reconstruction technique. COMPARISON:  Brain MRI 08/22/2021 FINDINGS: Brain: No evidence of acute infarction, hemorrhage, hydrocephalus, extra-axial collection or mass lesion/mass effect. Chronic left occipital infarct, PCA distribution. Small vessel ischemic gliosis in the cerebral white matter with chronic lacunar infarct at the left  thalamus. Vascular: No hyperdense vessel or unexpected calcification. Skull: Normal. Negative for fracture or focal lesion. Sinuses/Orbits: Patchy bilateral paranasal sinus opacification, confluent in the right maxillary and bifrontal sinuses with polypoid densities in the bilateral nasal cavity. IMPRESSION: 1. No acute finding. 2. 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 Electronically Signed   By: Tiburcio Pea M.D.   On: 10/22/2022 04:00    EKG: Independently reviewed.  Sinus, RBBB, chronic ST changes on multiple leads  Assessment/Plan Principal Problem:   Seizure [facility]) Active Problems:   Breakthrough seizure (HCC)  (please populate well all problems here in Problem List. (For example, if patient  is on BP meds at home and you resume or decide to hold them, it is a problem that needs to be her. Same for CAD, COPD, HLD and so on)  Breakthrough seizure -Neurology consulted and Dr. Otelia Limes recommended increase patient Depakote from 500 to 750 mg twice daily -Continue Keppra -Frequent neurochecks -EEG as per neurology -Given his mentations, we will keep him n.p.o. and speech evaluation tomorrow morning  Acute metabolic encephalopathy -Still in postictal state, rule out status epilepticus -ED has discussed case with on-call neurology who recommend patient can stay at [facility] for monitoring. -Seizure precaution, fall precaution -N.p.o. and speech evaluation tomorrow  CKD stage III -Euvolemic -N.p.o., IV fluid  Chronic A-fib on Coumadin -CT head reassuring -Consult pharmacy to redose Coumadin    DVT prophylaxis: Coumadin Code Status: Full code Family Communication: None at bedside Disposition Plan: Patient is sick with breakthrough seizure with continuous mentation changes, requiring continued inpatient monitoring, expect more than 2 midnight hospital stay Consults called: Neurology Admission status: PCU admit   Emeline General MD [facility] Pager  [phone]  10/22/2022, 12:01 PM

## 2022-10-22 NOTE — ED Notes (Signed)
Pt has been cleaned up twice since this nurse had pt. Pt ripped off his brief the second time and had voided in the bed soaking linens and getting urine on floor. Pt was cleaned up and an external catheter was placed on pt as pt is unable to use call bell and unable to ask for assistance. Oral care provided for pt and water has been given also

## 2022-10-22 NOTE — ED Notes (Signed)
Pt's oral care completed. His pure wick canister was emptied. This RN has never seen family or friends at bedside.  He is alert but he's unintelligible to this RN. He is communicating, but this RN cannot understand the pt.  Pt was given ginger ale, and he tolerates drinking po with his meds.

## 2022-10-22 NOTE — ED Provider Notes (Signed)
Patient seems to be slowly improving but remains postictal.  He tracks me as I enter the room and seems to be trying to speak to me.  No seizure activity at this time.  Will discuss with neurology admission here versus transfer to [facility]  Neurology recommends admission to [place], recommends increasing Depakote to 750 twice daily  Have discussed with the hospitalist for admission   Jene Every, MD 10/22/22 1207

## 2022-10-22 NOTE — ED Notes (Signed)
This tech and Edt Aiyahna changed pt because of pt being soiled. Pt in clean dry gown and bed sheets changed. No other needs at the moment.

## 2022-10-23 DIAGNOSIS — R569 Unspecified convulsions: Secondary | ICD-10-CM

## 2022-10-23 LAB — PROTIME-INR
INR: 3.3 — ABNORMAL HIGH (ref 0.8–1.2)
Prothrombin Time: 33.5 s — ABNORMAL HIGH (ref 11.4–15.2)

## 2022-10-23 MED ORDER — ASPIRIN 81 MG PO CHEW
81.0000 mg | CHEWABLE_TABLET | Freq: Every day | ORAL | Status: DC
  Administered 2022-10-23 – 2022-10-24 (×2): 81 mg via ORAL
  Filled 2022-10-23 (×2): qty 1

## 2022-10-23 MED ORDER — DIVALPROEX SODIUM 250 MG PO DR TAB: 750.0000 mg | DELAYED_RELEASE_TABLET | Freq: Two times a day (BID) | ORAL | 0 refills | Status: DC

## 2022-10-23 MED ORDER — VALPROIC ACID 250 MG/5ML PO SOLN
375.0000 mg | Freq: Four times a day (QID) | ORAL | Status: DC
  Administered 2022-10-23 (×3): 375 mg via ORAL
  Filled 2022-10-23 (×5): qty 10

## 2022-10-23 NOTE — Progress Notes (Signed)
PROGRESS NOTE    Nathan Shepherd  WUJ:[id number] DOB: 1955/08/08 DOA: 10/22/2022 PCP: Rush Farmer, NP    Brief Narrative:  67 y.o. male with medical history significant of seizure disorder, chronic A-fib on Coumadin, HTN, HLD, CKD stage IIIa, dementia, chronic anemia secondary to CKD, previous polysubstance abuse, sent from group home for evaluation of seizure.   Group home staff reported that the patient had 1 witnessed tonic-clonic seizure this morning.  EMS called in and en route to [facility] patient had another 2 episodes of seizure and was given Versed and seizure broke.  10/20: Baseline mentation returned.  No breakthrough seizures noted  Assessment & Plan:   Principal Problem:   Seizure [facility]) Active Problems:   Breakthrough seizure (HCC)   Breakthrough seizure -Neurology consulted and Dr. Otelia Limes recommended increase patient Depakote from 500 to 750 mg twice daily.  Defer official consultation for now Plan: -Continue Keppra -Frequent neurochecks -EEG as per neurology -Advance diet - Increased Depakote dosing - Seizure precautions   Acute metabolic encephalopathy -ED has discussed case with on-call neurology who recommend patient can stay at [facility] for monitoring.  Mentation appears back to baseline Plan: -Seizure precaution, fall precaution -Diet as tolerated   CKD stage III -Euvolemic -Diet advanced, stop IVF  Chronic A-fib on Coumadin -CT head reassuring -Consult pharmacy to redose Coumadin   DVT prophylaxis: Coumadin Code Status: Full Family Communication:None Disposition Plan: Status is: Inpatient Remains inpatient appropriate because: Unsafe discharge plan.  Attempted to return patient to his group home, but unfortunately they did not answer the phone.  Legal guardian contacted, who also could not reach his group home.  Reattempt in AM   Level of care: Med-Surg  Consultants:  None  Procedures:  None  Antimicrobials: None     Subjective: Seen and examined.  Per RN, baseline level of mentation.  Unable to get history  Objective: Vitals:   10/23/22 1100 10/23/22 1200 10/23/22 1300 10/23/22 1400  BP: (!) 149/96 (!) 139/99 (!) 142/100 (!) 136/97  Pulse: 65 71 (!) 39 71  Resp: (!) 24 (!) 23 (!) 24 (!) 21  Temp:      TempSrc:      SpO2: 99% 98% 100% 99%    Intake/Output Summary (Last 24 hours) at 10/23/2022 1645 Last data filed at 10/23/2022 0801 Gross per 24 hour  Intake 999 ml  Output 1400 ml  Net -401 ml   There were no vitals filed for this visit.  Examination:  General exam: Appears calm and comfortable  Respiratory system: Clear to auscultation. Respiratory effort normal. Cardiovascular system: S1-S2, RRR, no murmurs, no pedal edema Gastrointestinal system: Soft, NT/ND, normal bowel sounds Central nervous system: Garbled speech.  Unable to assess orientation Extremities: Symmetric 5 x 5 power. Skin: No rashes, lesions or ulcers Psychiatry: Not agitated.  Unable to complete psychiatric evaluation    Data Reviewed: I have personally reviewed following labs and imaging studies  CBC: Recent Labs  Lab 10/22/22 0304  WBC 7.8  NEUTROABS 6.5  HGB 13.2  HCT 43.3  MCV 85.4  PLT 207   Basic Metabolic Panel: Recent Labs  Lab 10/22/22 0304  NA 140  K 3.9  CL 102  CO2 30  GLUCOSE 131*  BUN 22  CREATININE 1.68*  CALCIUM 9.2  MG 1.9   GFR: CrCl cannot be calculated (Unknown ideal weight.). Liver Function Tests: Recent Labs  Lab 10/22/22 0304  AST 17  ALT 15  ALKPHOS 64  BILITOT 0.7  PROT 8.9*  ALBUMIN 4.2   No results for input(s): "LIPASE", "AMYLASE" in the last 168 hours. No results for input(s): "AMMONIA" in the last 168 hours. Coagulation Profile: Recent Labs  Lab 10/22/22 0304 10/23/22 0543  INR 2.2* 3.3*   Cardiac Enzymes: No results for input(s): "CKTOTAL", "CKMB", "CKMBINDEX", "TROPONINI" in the last 168 hours. BNP (last 3 results) No results for  input(s): "PROBNP" in the last 8760 hours. HbA1C: No results for input(s): "HGBA1C" in the last 72 hours. CBG: No results for input(s): "GLUCAP" in the last 168 hours. Lipid Profile: No results for input(s): "CHOL", "HDL", "LDLCALC", "TRIG", "CHOLHDL", "LDLDIRECT" in the last 72 hours. Thyroid Function Tests: No results for input(s): "TSH", "T4TOTAL", "FREET4", "T3FREE", "THYROIDAB" in the last 72 hours. Anemia Panel: No results for input(s): "VITAMINB12", "FOLATE", "FERRITIN", "TIBC", "IRON", "RETICCTPCT" in the last 72 hours. Sepsis Labs: No results for input(s): "PROCALCITON", "LATICACIDVEN" in the last 168 hours.  No results found for this or any previous visit (from the past 240 hour(s)).       Radiology Studies: DG Chest Portable 1 View  Result Date: 10/22/2022 CLINICAL DATA:  Recurrent seizure EXAM: PORTABLE CHEST 1 VIEW COMPARISON:  08/22/2021 FINDINGS: Cardiomegaly with vascular pedicle widening and aortic tortuosity. There is no edema, consolidation, effusion, or pneumothorax. IMPRESSION: Cardiomegaly without failure.  No acute finding. Electronically Signed   By: Tiburcio Pea M.D.   On: 10/22/2022 04:14   CT HEAD WO CONTRAST ( )  Result Date: 10/22/2022 CLINICAL DATA:  Breakthrough seizure EXAM: CT HEAD WITHOUT CONTRAST TECHNIQUE: Contiguous axial images were obtained from the base of the skull through the vertex without intravenous contrast. RADIATION DOSE REDUCTION: This exam was performed according to the departmental dose-optimization program which includes automated exposure control, adjustment of the mA and/or kV according to patient size and/or use of iterative reconstruction technique. COMPARISON:  Brain MRI 08/22/2021 FINDINGS: Brain: No evidence of acute infarction, hemorrhage, hydrocephalus, extra-axial collection or mass lesion/mass effect. Chronic left occipital infarct, PCA distribution. Small vessel ischemic gliosis in the cerebral white matter with chronic  lacunar infarct at the left thalamus. Vascular: No hyperdense vessel or unexpected calcification. Skull: Normal. Negative for fracture or focal lesion. Sinuses/Orbits: Patchy bilateral paranasal sinus opacification, confluent in the right maxillary and bifrontal sinuses with polypoid densities in the bilateral nasal cavity. IMPRESSION: 1. No acute finding. 2. 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 Electronically Signed   By: Tiburcio Pea M.D.   On: 10/22/2022 04:00        Scheduled Meds:  aspirin  81 mg Oral Daily   carvedilol  12.5 mg Oral BID WC   donepezil  5 mg Oral QHS   levETIRAcetam  1,000 mg Oral BID   losartan  50 mg Oral Daily   NIFEdipine  60 mg Oral Daily   mouth rinse  15 mL Mouth Rinse Q2H   rosuvastatin  20 mg Oral QHS   torsemide  20 mg Oral Daily   valproic acid  375 mg Oral QID   Warfarin - Pharmacist Dosing Inpatient   Does not apply q1600   Continuous Infusions:   LOS: 1 day    Tresa Moore, MD [facility]   If 7PM-7AM, please contact night-coverage  10/23/2022, 4:45 PM

## 2022-10-23 NOTE — Consult Note (Signed)
PHARMACY - ANTICOAGULATION CONSULT NOTE  Pharmacy Consult for warfarin Indication: atrial fibrillation  Allergies  Allergen Reactions   Penicillins Hives    Tolerated Zosyn    Patient Measurements: Height: 6\' 6"  (198.1 cm)   Vital Signs: Temp: 98 F (36.7 C) (10/20 0541) Temp Source: Oral (10/20 0541) BP: 140/84 (10/20 0700) Pulse Rate: 74 (10/20 0700)  Labs: Recent Labs    10/22/22 0304 10/23/22 0543  HGB 13.2  --   HCT 43.3  --   PLT 207  --   LABPROT 24.7* 33.5*  INR 2.2* 3.3*  CREATININE 1.68*  --     CrCl cannot be calculated (Unknown ideal weight.).   Medical History: Past Medical History:  Diagnosis Date   CHF (congestive heart failure) (HCC)    CKD (chronic kidney disease) stage 3, GFR 30-59 ml/min (HCC)    Edema    Hyperlipemia    Hypertension    Seizures (HCC)      Assessment: Patient is a 67 y.o. male admitted for seizure with a PMH of chronic A-fib on warfarin, HTN, HLD, seizure disorder, CKD stage IIIa, dementia, and CVA. Baseline INR is therapeutic at 2.2. No signs or symptoms of bleeding noted.   Home dose of warfarin: 7.5 mg by mouth daily. Last dose PTA was 10/18 at 1700.  - Patient on aspirin PTA. Will continue inpatient  - PTA Depakote increased from 500 mg BID to 750 mg BID due to breakthrough seizure. Depakote can elevate the concentration of warfarin and increase the risk of bleeding. INR may increase as a result of this change in therapy   Date INR Plan  10/19 2.2 7.5 mg  10/20 3.3 HOLD    Goal of Therapy:  INR 2-3 Monitor platelets by anticoagulation protocol: Yes   Plan:  INR is supratherapeutic, significantly elevated from yesterday. Possibly due to Depakote dosage increase, but such a large bump in INR is concerning for bleed risk. Will hold warfarin today.  Once INR is < 3, may need to decrease warfarin dose   Daily INR  CBC at least every 3 days  Monitoring for signs and symptoms of bleeding   Karlton Lemon,  PharmD Candidate  10/23/2022,8:32 AM

## 2022-10-23 NOTE — ED Notes (Addendum)
Previous note charted in error

## 2022-10-23 NOTE — ED Notes (Signed)
Meds delayed. Consulting with pharmacy to ensure meds can be crushed per diet order.

## 2022-10-23 NOTE — TOC Initial Note (Signed)
Transition of Care [facility]) - Initial/Assessment Note    Patient Details  Name: Nathan Shepherd MRN: [id number] Date of Birth: [birth year]  Transition of Care [facility]) CM/SW Contact:    Colette Ribas, LCSWA Phone Number: 10/23/2022, 5:18 PM  Clinical Narrative:                  CSW spoke with MD advised he is ready for DC, the Group home phone lines have been down the numbers we dialed were not working. I spoke with Sherdillia-DSS guardian, she advised the numbers she had listed weere also out. Relayed to MD nurse provided contact info for [facility]. I spoke with Arica-[phone] who advised transportation runs M-F and they can pick him up tomorrow.       Patient Goals and CMS Choice            Expected Discharge Plan and Services                                              Prior Living Arrangements/Services                       Activities of Daily Living      Permission Sought/Granted                  Emotional Assessment              Admission diagnosis:  Seizure [facility]) [R56.9] Patient Active Problem List   Diagnosis Date Noted   Seizure (HCC) 10/22/2022   Status epilepticus (HCC) 05/13/2022   Acute respiratory failure with hypoxia (HCC) 05/13/2022   Paroxysmal atrial fibrillation (HCC) 05/13/2022   Bradycardia 05/13/2022   Breakthrough seizure (HCC) 05/12/2022   Hypokalemia 08/25/2021   Acute respiratory failure with hypoxia (HCC) 08/25/2021   Chronic atrial fibrillation with RVR (HCC) 08/25/2021   Aspiration pneumonia (HCC) 08/25/2021   AKI (acute kidney injury) (HCC) 08/25/2021   Chronic kidney disease, stage III (moderate) (HCC) 08/25/2021   Hypophosphatemia 08/25/2021   Hypomagnesemia 08/25/2021   Status epilepticus (HCC) 08/21/2021   Altered mental state 08/17/2016   Edema    PCP:  Rush Farmer, NP Pharmacy:   [facility] - [place], [place] - [address] [place]  [address] Phone: [phone] Fax: [phone]  [facility] [address] [place] [address] Phone: [phone] Fax: [phone]     Social Determinants of Health (SDOH) Social History: SDOH Screenings   Tobacco Use: Unknown (05/18/2022)   SDOH Interventions:     Readmission Risk Interventions     No data to display

## 2022-10-23 NOTE — Evaluation (Signed)
Clinical/Bedside Swallow Evaluation Patient Details  Name: Shorty Schoene MRN: [id number] Date of Birth: 1955-05-16  Today's Date: 10/23/2022 Time: SLP Start Time (ACUTE ONLY): 1055 SLP Stop Time (ACUTE ONLY): 1108 SLP Time Calculation (min) (ACUTE ONLY): 13 min  Past Medical History:  Past Medical History:  Diagnosis Date   CHF (congestive heart failure) (HCC)    CKD (chronic kidney disease) stage 3, GFR 30-59 ml/min (HCC)    Edema    Hyperlipemia    Hypertension    Seizures (HCC)    Past Surgical History: History reviewed. No pertinent surgical history. HPI:  Pt is a 67 y.o. male s/p witnessed tonic-clinic sz on 10/19 while at group home. While en route to [facility], pt with x2 additional witnessed seizeures. Pt  with medical history significant of seizure disorder, chronic A-fib on Coumadin, HTN, HLD, CKD stage IIIa, dementia, chronic anemia secondary to CKD, and previous polysubstance abuse. Head CT, 10/19, "1. No acute finding.  2. Chronic small vessel disease and remote left PCA infarct." CXR, 10/19, "Cardiomegaly without failure.  No acute finding."    Assessment / Plan / Recommendation  Clinical Impression  Pt seen for clinical swallowing evaluation. Pt alert and cooperative. Aphasia noted and c/b perserverative verbal output and non-sensical speech. Pt seen at [facility] in May 2024 with a recommendation for a regular diet with thin liquids. Pt presents with s/sx mild-moderate oral dysphagia c/b prolonged/inefficient mastication of solids with perserverative "munching" pattern to mastication. Suspect oral deficits due to dental status (missing/broken teeth, poor hygeine) and exacerbated by mental status. Pharyngeal swallow appeared [facility] per clincial assessment. Recommend initiation of a Dysphagia 2 Diet with Thin Liquids with safe swallowing strategies/aspiration precautions as outlined below. SLP to f/u per POC for diet tolerance and trials of upgraded textures as appropriate. SLP Visit  Diagnosis: Dysphagia, oral phase (R13.11)    Aspiration Risk  Mild aspiration risk;Moderate aspiration risk    Diet Recommendation Dysphagia 2 (Fine chop);Thin liquid    Liquid Administration via: Spoon;Straw Medication Administration: Crushed with puree (vs x1 at a time whole with puree) Supervision: Staff to assist with self feeding;Full supervision/cueing for compensatory strategies Compensations: Minimize environmental distractions;Slow rate;Small sips/bites (check for oral clearance) Postural Changes: Seated upright at 90 degrees;Remain upright for at least 30 minutes after po intake    Other  Recommendations      Recommendations for follow up therapy are one component of a multi-disciplinary discharge planning process, led by the attending physician.  Recommendations may be updated based on patient status, additional functional criteria and insurance authorization.  Follow up Recommendations Follow physician's recommendations for discharge plan and follow up therapies         Functional Status Assessment Patient has had a recent decline in their functional status and demonstrates the ability to make significant improvements in function in a reasonable and predictable amount of time.  Frequency and Duration min 2x/week  2 weeks       Prognosis Prognosis for improved oropharyngeal function: Fair Barriers to Reach Goals: Severity of deficits (recent sz activity)      Swallow Study   General Date of Onset: 10/22/22 HPI: Pt is a 67 y.o. male s/p witnessed tonic-clinic sz on 10/19 while at group home. While en route to [facility], pt with x2 additional witnessed seizeures. Pt  with medical history significant of seizure disorder, chronic A-fib on Coumadin, HTN, HLD, CKD stage IIIa, dementia, chronic anemia secondary to CKD, and previous polysubstance abuse. Head CT, 10/19, "1. No acute  finding.  2. Chronic small vessel disease and remote left PCA infarct." CXR, 10/19, "Cardiomegaly  without failure.  No acute finding." Type of Study: Bedside Swallow Evaluation Previous Swallow Assessment: seen at [facility] 05/14/22 with recommendation for a regular diet with thin liquids Diet Prior to this Study: NPO Temperature Spikes Noted: No Respiratory Status: Room air History of Recent Intubation: No Behavior/Cognition: Alert;Cooperative;Confused Oral Cavity Assessment: Within Functional Limits Oral Care Completed by SLP: Yes Oral Cavity - Dentition: Poor condition;Missing dentition Vision: Functional for self-feeding Self-Feeding Abilities: Needs assist;Total assist Patient Positioning: Upright in bed Baseline Vocal Quality: Normal Volitional Cough: Cognitively unable to elicit Volitional Swallow: Unable to elicit    Oral/Motor/Sensory Function Overall Oral Motor/Sensory Function:  (unable to assess)   Ice Chips Ice chips: Within functional limits Presentation: Spoon   Thin Liquid Thin Liquid: Within functional limits Presentation: Spoon;Straw    Nectar Thick Nectar Thick Liquid: Not tested   Honey Thick Honey Thick Liquid: Not tested   Puree Puree: Within functional limits Presentation: Spoon   Solid     Solid: Impaired Oral Phase Impairments: Impaired mastication Oral Phase Functional Implications: Prolonged oral transit;Impaired mastication Pharyngeal Phase Impairments:  (WFL) Other Comments: perseverative "munching" pattern to mastication     Clyde Canterbury, M.S., CCC-SLP Speech-Language Pathologist [facility] [phone] (ASCOM)  Alessandra Bevels Kelley Knoth 10/23/2022,12:27 PM

## 2022-10-24 DIAGNOSIS — R569 Unspecified convulsions: Secondary | ICD-10-CM | POA: Diagnosis not present

## 2022-10-24 LAB — LEVETIRACETAM LEVEL: Levetiracetam Lvl: 41.7 ug/mL — ABNORMAL HIGH (ref 10.0–40.0)

## 2022-10-24 LAB — PROTIME-INR
INR: 3.2 — ABNORMAL HIGH (ref 0.8–1.2)
Prothrombin Time: 32.9 s — ABNORMAL HIGH (ref 11.4–15.2)

## 2022-10-24 MED ORDER — DIVALPROEX SODIUM 250 MG PO DR TAB: 750.0000 mg | DELAYED_RELEASE_TABLET | Freq: Two times a day (BID) | ORAL | 0 refills | Status: AC

## 2022-10-24 MED ORDER — WARFARIN SODIUM 6 MG PO TABS: 6.0000 mg | ORAL_TABLET | Freq: Every evening | ORAL | Status: DC

## 2022-10-24 NOTE — Discharge Summary (Signed)
Physician Discharge Summary  Nathan Shepherd ONG:[id number] DOB: 11/30/1955 DOA: 10/22/2022  PCP: Rush Farmer, NP  Admit date: 10/22/2022 Discharge date: 10/24/2022  Admitted From: Group home Disposition:  Group home  Recommendations for Outpatient Follow-up:  Follow up with PCP in 1-2 weeks Consider referral to outpatient neurology  Home Health:No  Equipment/Devices:None   Discharge Condition:Stable  CODE STATUS:FULL  Diet recommendation: Dysphagia 2  Brief/Interim Summary:  67 y.o. male with medical history significant of seizure disorder, chronic A-fib on Coumadin, HTN, HLD, CKD stage IIIa, dementia, chronic anemia secondary to CKD, previous polysubstance abuse, sent from group home for evaluation of seizure.   Group home staff reported that the patient had 1 witnessed tonic-clonic seizure this morning.  EMS called in and en route to [facility] patient had another 2 episodes of seizure and was given Versed and seizure broke.   10/20: Baseline mentation returned.  No breakthrough seizures noted  10/21: Transported back to group home    Discharge Diagnoses:  Principal Problem:   Seizure [facility]) Active Problems:   Breakthrough seizure (HCC)  Breakthrough seizure -Neurology consulted and Dr. Otelia Limes recommended increase patient Depakote from 500 to 750 mg twice daily.  Defer official consultation for now Plan: - DC to group home - Increase depakote to 750 BID -Continue Keppra - Consider outpatient neurology referral     Discharge Instructions  Discharge Instructions     Diet - low sodium heart healthy   Complete by: As directed    Increase activity slowly   Complete by: As directed       Allergies as of 10/24/2022       Reactions   Penicillins Hives   Tolerated Zosyn        Medication List     STOP taking these medications    budesonide-formoterol 160-4.5 MCG/ACT inhaler Commonly known as: SYMBICORT       TAKE these medications     acetaminophen 325 MG tablet Commonly known as: TYLENOL Take 650 mg by mouth every 4 (four) hours as needed for mild pain (pain score 1-3).   amLODipine 2.5 MG tablet Commonly known as: NORVASC Take 1 tablet (2.5 mg total) by mouth daily.   ANTACID PO Take 30 mLs by mouth 4 (four) times daily as needed (heartburn, indigestion). Antacid Suspension   aspirin EC 81 MG tablet Take 81 mg by mouth daily.   carvedilol 12.5 MG tablet Commonly known as: COREG Take 12.5 mg by mouth 2 (two) times daily with a meal.   diphenhydrAMINE 25 MG tablet Commonly known as: BENADRYL Take 25 mg by mouth every 6 (six) hours as needed for allergies or itching.   divalproex 250 MG DR tablet Commonly known as: DEPAKOTE Take 3 tablets (750 mg total) by mouth every 12 (twelve) hours. What changed:  medication strength how much to take Another medication with the same name was removed. Continue taking this medication, and follow the directions you see here.   docusate sodium 100 MG capsule Commonly known as: COLACE Take 100 mg by mouth at bedtime.   donepezil 5 MG tablet Commonly known as: ARICEPT Take 5 mg by mouth at bedtime.   eucerin lotion Apply 1 Application topically daily. (Apply to legs and feet)   fluocinolone 0.01 % cream Apply 1 application  topically 3 (three) times daily as needed (bug bites/rash).   guaiFENesin 100 MG/5ML liquid Commonly known as: ROBITUSSIN Take 10 mLs by mouth every 6 (six) hours as needed for cough or to loosen phlegm.  levETIRAcetam 1000 MG tablet Commonly known as: KEPPRA Take 1,000 mg by mouth 2 (two) times daily. What changed: Another medication with the same name was removed. Continue taking this medication, and follow the directions you see here.   loperamide 2 MG capsule Commonly known as: IMODIUM Take 1 capsule (2 mg total) by mouth as needed for diarrhea or loose stools. What changed: additional instructions   losartan 50 MG  tablet Commonly known as: COZAAR Take 50 mg by mouth daily.   magnesium hydroxide 400 MG/5ML suspension Commonly known as: MILK OF MAGNESIA Take 30 mLs by mouth at bedtime as needed for mild constipation.   NIFEdipine 60 MG 24 hr tablet Commonly known as: PROCARDIA XL/NIFEDICAL XL Take 60 mg by mouth daily.   rosuvastatin 20 MG tablet Commonly known as: CRESTOR Take 20 mg by mouth at bedtime.   torsemide 20 MG tablet Commonly known as: DEMADEX Take 20 mg by mouth daily.   Vitamin D (Ergocalciferol) 1.25 MG (50000 UNIT) Caps capsule Commonly known as: DRISDOL Take 50,000 Units by mouth every Monday.   warfarin 6 MG tablet Commonly known as: COUMADIN Take 1 tablet (6 mg total) by mouth every evening. What changed:  medication strength how much to take Another medication with the same name was removed. Continue taking this medication, and follow the directions you see here.        Allergies  Allergen Reactions   Penicillins Hives    Tolerated Zosyn    Consultations: None   Procedures/Studies: DG Chest Portable 1 View  Result Date: 10/22/2022 CLINICAL DATA:  Recurrent seizure EXAM: PORTABLE CHEST 1 VIEW COMPARISON:  08/22/2021 FINDINGS: Cardiomegaly with vascular pedicle widening and aortic tortuosity. There is no edema, consolidation, effusion, or pneumothorax. IMPRESSION: Cardiomegaly without failure.  No acute finding. Electronically Signed   By: Tiburcio Pea M.D.   On: 10/22/2022 04:14   CT HEAD WO CONTRAST ( )  Result Date: 10/22/2022 CLINICAL DATA:  Breakthrough seizure EXAM: CT HEAD WITHOUT CONTRAST TECHNIQUE: Contiguous axial images were obtained from the base of the skull through the vertex without intravenous contrast. RADIATION DOSE REDUCTION: This exam was performed according to the departmental dose-optimization program which includes automated exposure control, adjustment of the mA and/or kV according to patient size and/or use of iterative  reconstruction technique. COMPARISON:  Brain MRI 08/22/2021 FINDINGS: Brain: No evidence of acute infarction, hemorrhage, hydrocephalus, extra-axial collection or mass lesion/mass effect. Chronic left occipital infarct, PCA distribution. Small vessel ischemic gliosis in the cerebral white matter with chronic lacunar infarct at the left thalamus. Vascular: No hyperdense vessel or unexpected calcification. Skull: Normal. Negative for fracture or focal lesion. Sinuses/Orbits: Patchy bilateral paranasal sinus opacification, confluent in the right maxillary and bifrontal sinuses with polypoid densities in the bilateral nasal cavity. IMPRESSION: 1. No acute finding. 2. 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 Electronically Signed   By: Tiburcio Pea M.D.   On: 10/22/2022 04:00      Subjective: Seen and examined on day of dc.  Back to baseline  Discharge Exam: Vitals:   10/24/22 0500 10/24/22 0600  BP: 132/89 (!) 146/103  Pulse: 68 67  Resp: 19 (!) 26  Temp:    SpO2: 100% 99%   Vitals:   10/24/22 0300 10/24/22 0400 10/24/22 0500 10/24/22 0600  BP: (!) 138/99 (!) 132/96 132/89 (!) 146/103  Pulse: 74 95 68 67  Resp: (!) 26 (!) 24 19 (!) 26  Temp:      TempSrc:  SpO2: 99% 99% 100% 99%    General: Pt is alert, awake, not in acute distress Cardiovascular: RRR, S1/S2 +, no rubs, no gallops Respiratory: CTA bilaterally, no wheezing, no rhonchi Abdominal: Soft, NT, ND, bowel sounds + Extremities: no edema, no cyanosis    The results of significant diagnostics from this hospitalization (including imaging, microbiology, ancillary and laboratory) are listed below for reference.     Microbiology: No results found for this or any previous visit (from the past 240 hour(s)).   Labs: BNP (last 3 results) No results for input(s): "BNP" in the last 8760 hours. Basic Metabolic Panel: Recent Labs  Lab 10/22/22 0304  NA 140  K 3.9  CL 102  CO2 30  GLUCOSE 131*  BUN 22   CREATININE 1.68*  CALCIUM 9.2  MG 1.9   Liver Function Tests: Recent Labs  Lab 10/22/22 0304  AST 17  ALT 15  ALKPHOS 64  BILITOT 0.7  PROT 8.9*  ALBUMIN 4.2   No results for input(s): "LIPASE", "AMYLASE" in the last 168 hours. No results for input(s): "AMMONIA" in the last 168 hours. CBC: Recent Labs  Lab 10/22/22 0304  WBC 7.8  NEUTROABS 6.5  HGB 13.2  HCT 43.3  MCV 85.4  PLT 207   Cardiac Enzymes: No results for input(s): "CKTOTAL", "CKMB", "CKMBINDEX", "TROPONINI" in the last 168 hours. BNP: Invalid input(s): "POCBNP" CBG: No results for input(s): "GLUCAP" in the last 168 hours. D-Dimer No results for input(s): "DDIMER" in the last 72 hours. Hgb A1c No results for input(s): "HGBA1C" in the last 72 hours. Lipid Profile No results for input(s): "CHOL", "HDL", "LDLCALC", "TRIG", "CHOLHDL", "LDLDIRECT" in the last 72 hours. Thyroid function studies No results for input(s): "TSH", "T4TOTAL", "T3FREE", "THYROIDAB" in the last 72 hours.  Invalid input(s): "FREET3" Anemia work up No results for input(s): "VITAMINB12", "FOLATE", "FERRITIN", "TIBC", "IRON", "RETICCTPCT" in the last 72 hours. Urinalysis    Component Value Date/Time   COLORURINE STRAW (A) 10/22/2022 0304   APPEARANCEUR CLEAR (A) 10/22/2022 0304   LABSPEC 1.012 10/22/2022 0304   PHURINE 6.0 10/22/2022 0304   GLUCOSEU NEGATIVE 10/22/2022 0304   HGBUR NEGATIVE 10/22/2022 0304   BILIRUBINUR NEGATIVE 10/22/2022 0304   KETONESUR NEGATIVE 10/22/2022 0304   PROTEINUR 30 (A) 10/22/2022 0304   NITRITE NEGATIVE 10/22/2022 0304   LEUKOCYTESUR NEGATIVE 10/22/2022 0304   Sepsis Labs Recent Labs  Lab 10/22/22 0304  WBC 7.8   Microbiology No results found for this or any previous visit (from the past 240 hour(s)).   Time coordinating discharge: Over 30 minutes  SIGNED:   Tresa Moore, MD  [facility] 10/24/2022, 8:08 AM Pager   If 7PM-7AM, please contact night-coverage

## 2022-11-15 ENCOUNTER — Ambulatory Visit: Payer: Medicare Other | Admitting: Podiatry

## 2023-02-22 ENCOUNTER — Other Ambulatory Visit: Payer: Self-pay

## 2023-02-22 ENCOUNTER — Emergency Department: Payer: Medicare Other

## 2023-02-22 ENCOUNTER — Encounter: Payer: Self-pay | Admitting: Emergency Medicine

## 2023-02-22 ENCOUNTER — Emergency Department
Admission: EM | Admit: 2023-02-22 | Discharge: 2023-02-23 | Disposition: A | Discharge status: Home / Self Care | Payer: Medicare Other | Attending: Emergency Medicine | Admitting: Emergency Medicine

## 2023-02-22 DIAGNOSIS — Y92009 Unspecified place in unspecified non-institutional (private) residence as the place of occurrence of the external cause: Secondary | ICD-10-CM

## 2023-02-22 DIAGNOSIS — I509 Heart failure, unspecified: Secondary | ICD-10-CM | POA: Diagnosis not present

## 2023-02-22 DIAGNOSIS — R7309 Other abnormal glucose: Secondary | ICD-10-CM | POA: Diagnosis not present

## 2023-02-22 DIAGNOSIS — S0990XA Unspecified injury of head, initial encounter: Secondary | ICD-10-CM | POA: Insufficient documentation

## 2023-02-22 DIAGNOSIS — W19XXXA Unspecified fall, initial encounter: Secondary | ICD-10-CM | POA: Insufficient documentation

## 2023-02-22 DIAGNOSIS — Y92019 Unspecified place in single-family (private) house as the place of occurrence of the external cause: Secondary | ICD-10-CM | POA: Diagnosis not present

## 2023-02-22 DIAGNOSIS — I6789 Other cerebrovascular disease: Secondary | ICD-10-CM | POA: Diagnosis not present

## 2023-02-22 DIAGNOSIS — R509 Fever, unspecified: Secondary | ICD-10-CM | POA: Diagnosis present

## 2023-02-22 DIAGNOSIS — N189 Chronic kidney disease, unspecified: Secondary | ICD-10-CM | POA: Insufficient documentation

## 2023-02-22 DIAGNOSIS — J101 Influenza due to other identified influenza virus with other respiratory manifestations: Secondary | ICD-10-CM | POA: Insufficient documentation

## 2023-02-22 DIAGNOSIS — Z043 Encounter for examination and observation following other accident: Secondary | ICD-10-CM | POA: Diagnosis not present

## 2023-02-22 DIAGNOSIS — I130 Hypertensive heart and chronic kidney disease with heart failure and stage 1 through stage 4 chronic kidney disease, or unspecified chronic kidney disease: Secondary | ICD-10-CM | POA: Diagnosis not present

## 2023-02-22 LAB — COMPREHENSIVE METABOLIC PANEL WITH GFR
ALT: 17 U/L (ref 0–44)
AST: 23 U/L (ref 15–41)
Albumin: 3.1 g/dL — ABNORMAL LOW (ref 3.5–5.0)
Alkaline Phosphatase: 50 U/L (ref 38–126)
Anion gap: 11 (ref 5–15)
BUN: 17 mg/dL (ref 8–23)
CO2: 28 mmol/L (ref 22–32)
Calcium: 8.8 mg/dL — ABNORMAL LOW (ref 8.9–10.3)
Chloride: 101 mmol/L (ref 98–111)
Creatinine, Ser: 1.4 mg/dL — ABNORMAL HIGH (ref 0.61–1.24)
GFR, Estimated: 56 mL/min — ABNORMAL LOW
Glucose, Bld: 101 mg/dL — ABNORMAL HIGH (ref 70–99)
Potassium: 3.1 mmol/L — ABNORMAL LOW (ref 3.5–5.1)
Sodium: 140 mmol/L (ref 135–145)
Total Bilirubin: 0.9 mg/dL (ref 0.0–1.2)
Total Protein: 6.6 g/dL (ref 6.5–8.1)

## 2023-02-22 LAB — CBC WITH DIFFERENTIAL/PLATELET
Abs Immature Granulocytes: 0.05 10*3/uL (ref 0.00–0.07)
Basophils Absolute: 0 10*3/uL (ref 0.0–0.1)
Basophils Relative: 0 %
Eosinophils Absolute: 0 10*3/uL (ref 0.0–0.5)
Eosinophils Relative: 0 %
HCT: 42.3 % (ref 39.0–52.0)
Hemoglobin: 13.3 g/dL (ref 13.0–17.0)
Immature Granulocytes: 1 %
Lymphocytes Relative: 4 %
Lymphs Abs: 0.2 10*3/uL — ABNORMAL LOW (ref 0.7–4.0)
MCH: 27.7 pg (ref 26.0–34.0)
MCHC: 31.4 g/dL (ref 30.0–36.0)
MCV: 87.9 fL (ref 80.0–100.0)
Monocytes Absolute: 0.7 10*3/uL (ref 0.1–1.0)
Monocytes Relative: 13 %
Neutro Abs: 4.4 10*3/uL (ref 1.7–7.7)
Neutrophils Relative %: 82 %
Platelets: 162 10*3/uL (ref 150–400)
RBC: 4.81 MIL/uL (ref 4.22–5.81)
RDW: 15.5 % (ref 11.5–15.5)
WBC: 5.4 10*3/uL (ref 4.0–10.5)
nRBC: 0 % (ref 0.0–0.2)

## 2023-02-22 LAB — RESP PANEL BY RT-PCR (RSV, FLU A&B, COVID)  RVPGX2
Influenza A by PCR: POSITIVE — AB
Influenza B by PCR: NEGATIVE
Resp Syncytial Virus by PCR: NEGATIVE
SARS Coronavirus 2 by RT PCR: NEGATIVE

## 2023-02-22 LAB — URINALYSIS, ROUTINE W REFLEX MICROSCOPIC
Bacteria, UA: NONE SEEN
Bilirubin Urine: NEGATIVE
Glucose, UA: NEGATIVE mg/dL
Ketones, ur: 5 mg/dL — AB
Leukocytes,Ua: NEGATIVE
Nitrite: NEGATIVE
Protein, ur: NEGATIVE mg/dL
Specific Gravity, Urine: 1.011 (ref 1.005–1.030)
Squamous Epithelial / HPF: 0 /[HPF] (ref 0–5)
pH: 7 (ref 5.0–8.0)

## 2023-02-22 LAB — TROPONIN I (HIGH SENSITIVITY)
Troponin I (High Sensitivity): 32 ng/L — ABNORMAL HIGH (ref <18)
Troponin I (High Sensitivity): 45 ng/L — ABNORMAL HIGH

## 2023-02-22 LAB — PROTIME-INR
INR: 3.4 — ABNORMAL HIGH (ref 0.8–1.2)
Prothrombin Time: 34.7 s — ABNORMAL HIGH (ref 11.4–15.2)

## 2023-02-22 LAB — BRAIN NATRIURETIC PEPTIDE: B Natriuretic Peptide: 322.9 pg/mL — ABNORMAL HIGH (ref 0.0–100.0)

## 2023-02-22 LAB — CBG MONITORING, ED: Glucose-Capillary: 76 mg/dL (ref 70–99)

## 2023-02-22 MED ORDER — POTASSIUM CHLORIDE CRYS ER 20 MEQ PO TBCR
40.0000 meq | EXTENDED_RELEASE_TABLET | Freq: Once | ORAL | Status: AC
  Administered 2023-02-22: 40 meq via ORAL
  Filled 2023-02-22: qty 2

## 2023-02-22 MED ORDER — IBUPROFEN 800 MG PO TABS
800.0000 mg | ORAL_TABLET | Freq: Once | ORAL | Status: AC
  Administered 2023-02-22: 800 mg via ORAL
  Filled 2023-02-22: qty 1

## 2023-02-22 MED ORDER — ACETAMINOPHEN 325 MG PO TABS
650.0000 mg | ORAL_TABLET | Freq: Once | ORAL | Status: AC
  Administered 2023-02-22: 650 mg via ORAL
  Filled 2023-02-22: qty 2

## 2023-02-22 NOTE — ED Notes (Signed)
 Called and spoke with [place] the administrator with [facility] who advised no transportation tonight, transportation is M-F 8a-5p. Joycelyn Das stated that last night another resident was here and they had [employer] take that individual back to [place], unknown if they will tonight d/t inclement weather and out of their jurisdiction., Charge RN Erie Noe is aware.

## 2023-02-22 NOTE — ED Triage Notes (Signed)
 Presents via EMS from group home  S/p fall and weakness  Denies any pain  Febrile on arrival

## 2023-02-22 NOTE — ED Provider Notes (Signed)
 [facility] Provider Note    Event Date/Time   First MD Initiated Contact with Patient 02/22/23 1742     (approximate)   History   Fall and Fever   HPI  Nathan Shepherd is a 68 y.o. male with PMH of hypertension, CHF, seizures and CKD brought in by ambulance from a group home after a fall.  Patient states that he was shaking and was told by a staff member to lay down.  He followed directions, lay down and then later got up to go to the bathroom which is when he fell.  Patient denies feeling dizzy or lightheaded before falling but is not sure what caused him to fall.  He does not think he hit his head.  He does not believe he had a seizure.  He denies any pain at this time.  No chest pain, shortness of breath, belly pain, N/V/D/C, headache, blurry vision.  He does state that he feels weak in general.      Physical Exam   Triage Vital Signs: ED Triage Vitals  Encounter Vitals Group     BP 02/22/23 1737 (!) 177/102     Systolic BP Percentile --      Diastolic BP Percentile --      Pulse --      Resp 02/22/23 1737 16     Temp 02/22/23 1737 (!) 102.5 F (39.2 C)     Temp src --      SpO2 --      Weight 02/22/23 1738 198 lb 6.6 oz (90 kg)     Height 02/22/23 1738 6\' 6"  (1.981 m)     Head Circumference --      Peak Flow --      Pain Score 02/22/23 1738 0     Pain Loc --      Pain Education --      Exclude from Growth Chart --     Most recent vital signs: Vitals:   02/22/23 2000 02/22/23 2023  BP: 138/89   Pulse: 96   Resp: (!) 27   Temp:  (!) 100.6 F (38.1 C)  SpO2: 95%     General: Awake, no distress.  CV:  Good peripheral perfusion.  Irregular rate.  Radial pulse 2+ and regular.  Dorsalis pedis pulse 2+ and regular. Resp:  Normal effort.  CTAB. Abd:  No distention.  Soft, nontender, bowel sounds appropriate. Other:  Patient able to stand and shuffle side-to-side.  5/5 strength in bilateral upper and lower extremities. No pitting  edema.  Neuro:  PERRL, EOM intact, horizontal nystagmus present bilaterally, sensation intact in bilateral upper and lower extremities.  No focal neurodeficits. Skin:   Warm and dry.    ED Results / Procedures / Treatments   Labs (all labs ordered are listed, but only abnormal results are displayed) Labs Reviewed  RESP PANEL BY RT-PCR (RSV, FLU A&B, COVID)  RVPGX2 - Abnormal; Notable for the following components:      Result Value   Influenza A by PCR POSITIVE (*)    All other components within normal limits  CBC WITH DIFFERENTIAL/PLATELET - Abnormal; Notable for the following components:   Lymphs Abs 0.2 (*)    All other components within normal limits  URINALYSIS, ROUTINE W REFLEX MICROSCOPIC - Abnormal; Notable for the following components:   Color, Urine YELLOW (*)    APPearance CLEAR (*)    Hgb urine dipstick SMALL (*)    Ketones, ur 5 (*)  All other components within normal limits  BRAIN NATRIURETIC PEPTIDE - Abnormal; Notable for the following components:   B Natriuretic Peptide 322.9 (*)    All other components within normal limits  PROTIME-INR - Abnormal; Notable for the following components:   Prothrombin Time 34.7 (*)    INR 3.4 (*)    All other components within normal limits  COMPREHENSIVE METABOLIC PANEL - Abnormal; Notable for the following components:   Potassium 3.1 (*)    Glucose, Bld 101 (*)    Creatinine, Ser 1.40 (*)    Calcium 8.8 (*)    Albumin 3.1 (*)    GFR, Estimated 56 (*)    All other components within normal limits  TROPONIN I (HIGH SENSITIVITY) - Abnormal; Notable for the following components:   Troponin I (High Sensitivity) 32 (*)    All other components within normal limits  TROPONIN I (HIGH SENSITIVITY) - Abnormal; Notable for the following components:   Troponin I (High Sensitivity) 45 (*)    All other components within normal limits  CBG MONITORING, ED     EKG  ED provider interpretation: Atrial fibrillation with a right bundle  branch block.  Vent. rate 87 BPM PR interval * ms QRS duration 148 ms QT/QTcB 388/466 ms P-R-T axes * 39 82   RADIOLOGY  CT head, neck and chest x-ray obtained.  I interpreted the images as well as reviewed the radiologist report.  CT head and cervical spine does not show any acute abnormalities.  There is stable background of moderate chronic small vessel disease and old infarcts in the left occipital lobe and left thalamus.  Chest x-ray was negative for any acute abnormalities but did show stable cardiomegaly.  PROCEDURES:  Critical Care performed: No  Procedures   MEDICATIONS ORDERED IN ED: Medications  acetaminophen (TYLENOL) tablet 650 mg (650 mg Oral Given 02/22/23 1809)  ibuprofen (ADVIL) tablet 800 mg (800 mg Oral Given 02/22/23 2021)  potassium chloride SA (KLOR-CON M) CR tablet 40 mEq (40 mEq Oral Given 02/22/23 2202)     IMPRESSION / MDM / ASSESSMENT AND PLAN / ED COURSE  I reviewed the triage vital signs and the nursing notes.                             68 year old male presents for evaluation after a fall.  Patient was febrile and tachycardic upon initial presentation.  Patient NAD and nontoxic appearing.  Differential diagnosis includes, but is not limited to, viral illness, ACS, CVA, intracranial hemorrhage, UTI, seizure, electrolyte derangement, medication side effect, dysrhythmia.  Patient's presentation is most consistent with acute presentation with potential threat to life or bodily function.  CBC unremarkable.  CBG WNL.   CMP shows slight hypokalemia as well as an elevated creatinine.  Patient was given potassium chloride.  Creatinine is at patient's baseline.  Urinalysis shows small hemoglobin and some ketones.  Troponin was elevated at 32 and then increased to 45.  Patient has previously elevated troponins around the same range.  He is not complaining of any chest pain or shortness of breath so this is unremarkable.  PT INR is elevated but patient is  on Coumadin for A-fib so this is to be expected.  It is a little outside the 2-3 range, but  he has no signs of bleeding at this time. Patient was informed of this and advised him to follow-up with his primary care regarding this.  BNP is slightly  elevated however this is close to patient's baseline.  He denies shortness of breath and does not have any peripheral edema at this time.  I do not believe Lasix are indicated.  Respiratory panel was positive for influenza A.  EKG shows A-fib with a right bundle branch block.  CT of the head and neck were negative for any acute abnormalities.  Chest x-ray showed stable cardiomegaly.  Patient is overall well-appearing on exam.  Nontoxic and in no acute distress.  He denies any pain at this time.  I believe his fall may have been a result of weakness from the flu or being in A-fib.  I do not feel he warrants further emergent workup.  I believe he is safe to go home.  Patient was instructed to continue to take all of his medications as prescribed.  He was updated on all of the results from today and given copies of the imaging results.  He voiced understanding, all questions were answered and he was stable at discharge.     FINAL CLINICAL IMPRESSION(S) / ED DIAGNOSES   Final diagnoses:  Influenza A  Fall in home, initial encounter     Rx / DC Orders   ED Discharge Orders     None        Note:  This document was prepared using Dragon voice recognition software and may include unintentional dictation errors.   Cameron Ali, PA-C 02/22/23 2206    Merwyn Katos, MD 02/22/23 [phone]

## 2023-02-22 NOTE — Discharge Instructions (Addendum)
 You tested positive for the flu today.  I believe this is the explanation for the weakness that caused your fall.  You are also in A-fib which may have contributed to the weakness as well. Your imaging studies were negative today for any acute abnormalities, the results are below.  Your INR, which is a marker of your clotting time was little bit elevated today.  This is expected as you are on the blood thinner.  The goal is to keep it between 2 and 3, yours was 3.4 today.  Please discuss this with your primary care provider as the dosage of your Coumadin may need to be adjusted.  Results of your imaging studies are below:  CT Head and CT Cervical Spine IMPRESSION:  1. No acute intracranial abnormality. Stable background of moderate  chronic small-vessel disease and old infarcts in the left occipital  lobe and left thalamus.  2. No acute cervical spine fracture or traumatic listhesis.   Chest Xray IMPRESSION:  1. No acute findings in the chest.  2. Stable cardiomegaly.

## 2023-02-22 NOTE — ED Notes (Signed)
 Secretary is calling [employer] [facility] to see if they will come and transports pt back to the group home.

## 2023-02-22 NOTE — ED Notes (Signed)
 Attempted to reach out again to [employer], phone is still going straight to voicemail, left message to please return call

## 2023-02-22 NOTE — ED Notes (Signed)
 [facility], [place] in [place] [phone]) No answer, mailbox is full, unable to leave message at this time. Will attempt to call again momentary to arrange transport back to care home.

## 2023-02-23 NOTE — ED Notes (Signed)
 This tech and [employer] cleaned pt up after a large BM. Brief and chux pads were changed. Pt then slid up in the stretcher. Pt now resting in the bed at this time.

## 2023-02-27 ENCOUNTER — Encounter: Payer: Self-pay | Admitting: Podiatry

## 2023-02-27 ENCOUNTER — Ambulatory Visit (INDEPENDENT_AMBULATORY_CARE_PROVIDER_SITE_OTHER): Payer: Medicare Other | Admitting: Podiatry

## 2023-02-27 VITALS — Ht 78.0 in | Wt 198.4 lb

## 2023-02-27 DIAGNOSIS — M79674 Pain in right toe(s): Secondary | ICD-10-CM | POA: Diagnosis not present

## 2023-02-27 DIAGNOSIS — M79675 Pain in left toe(s): Secondary | ICD-10-CM

## 2023-02-27 DIAGNOSIS — B351 Tinea unguium: Secondary | ICD-10-CM | POA: Diagnosis not present

## 2023-02-27 NOTE — Progress Notes (Signed)
        Subjective:  Patient ID: Nathan Shepherd, male    DOB: 01/09/1955,  MRN: [id number]  68 y.o. male presents painful mycotic toenails x 10 which interfere with daily activities. Pain is relieved with periodic professional debridement.  Chief Complaint  Patient presents with   Nail Problem    Pt is here for Nathan Shepherd PCP is Dr Sherre Poot, pt is unsure of LOV     New problem(s): None   PCP is Rush Farmer, NP.  Allergies  Allergen Reactions   Penicillins Hives    Tolerated Zosyn    Review of Systems: Negative except as noted in the HPI.   Objective:  Nathan Shepherd is a pleasant 68 y.o. male WD, WN in NAD. AAO x 3.  Vascular Examination: Faintly palpable pedal pulses. CFT immediate b/l. Pedal hair diminished b/l. Trace edema of ankles b/l. No pain with calf compression b/l. Skin temperature gradient WNL b/l. No varicosities noted. No cyanosis or clubbing noted.  Neurological Examination: Sensation grossly intact b/l with 10 gram monofilament. Vibratory sensation intact b/l.  Dermatological Examination: Pedal skin with normal turgor and tone b/l. No open wounds nor interdigital macerations noted. Toenails 1-5 b/l thick, discolored, elongated with subungual debris and pain on dorsal palpation. No hyperkeratotic lesions noted b/l. Pedal skin dry b/l.  Musculoskeletal Examination: Muscle strength 5/5 to b/l LE.  No pain, crepitus noted b/l. HAV with bunion b/l. Patient ambulates independently without assistive aids.   Radiographs: None  Last A1c:      Latest Ref Rng & Units 05/12/2022    3:09 PM  Hemoglobin A1C  Hemoglobin-A1c 4.8 - 5.6 % 6.1      Assessment:   1. Pain due to onychomycosis of toenails of both feet     Plan:  -Patient was evaluated today. All questions/concerns addressed on today's visit. -Patient to continue soft, supportive shoe gear daily. -Mycotic toenails 1-5 bilaterally were debrided in length and girth with sterile nail nippers and  dremel without incident. -For dry skin, recommended OTC moisturizers. Patient/POA instructed to apply to foot/feet once daily avoiding application between toes.  -Patient/POA to call should there be question/concern in the interim.  Return in about 3 months (around 05/27/2023).  Nathan Shepherd, DPM      Nathan Shepherd LOCATION: [address] [place] [address]                   Office [phone]   [facility] LOCATION: [address] [place], [place] [address] Office [phone]

## 2023-03-22 ENCOUNTER — Other Ambulatory Visit: Payer: Self-pay

## 2023-03-22 ENCOUNTER — Encounter (HOSPITAL_COMMUNITY): Payer: Self-pay | Admitting: Internal Medicine

## 2023-03-22 ENCOUNTER — Inpatient Hospital Stay (HOSPITAL_COMMUNITY)
Admission: EM | Admit: 2023-03-22 | Discharge: 2023-03-25 | DRG: 813 | Attending: Internal Medicine | Admitting: Internal Medicine

## 2023-03-22 DIAGNOSIS — E8809 Other disorders of plasma-protein metabolism, not elsewhere classified: Secondary | ICD-10-CM | POA: Diagnosis present

## 2023-03-22 DIAGNOSIS — Z7901 Long term (current) use of anticoagulants: Secondary | ICD-10-CM | POA: Diagnosis not present

## 2023-03-22 DIAGNOSIS — R569 Unspecified convulsions: Secondary | ICD-10-CM

## 2023-03-22 DIAGNOSIS — Z7951 Long term (current) use of inhaled steroids: Secondary | ICD-10-CM | POA: Diagnosis not present

## 2023-03-22 DIAGNOSIS — T45515A Adverse effect of anticoagulants, initial encounter: Secondary | ICD-10-CM | POA: Diagnosis present

## 2023-03-22 DIAGNOSIS — R791 Abnormal coagulation profile: Secondary | ICD-10-CM | POA: Diagnosis not present

## 2023-03-22 DIAGNOSIS — D6832 Hemorrhagic disorder due to extrinsic circulating anticoagulants: Secondary | ICD-10-CM | POA: Diagnosis present

## 2023-03-22 DIAGNOSIS — N1832 Chronic kidney disease, stage 3b: Secondary | ICD-10-CM | POA: Diagnosis present

## 2023-03-22 DIAGNOSIS — Z79899 Other long term (current) drug therapy: Secondary | ICD-10-CM | POA: Diagnosis not present

## 2023-03-22 DIAGNOSIS — I129 Hypertensive chronic kidney disease with stage 1 through stage 4 chronic kidney disease, or unspecified chronic kidney disease: Secondary | ICD-10-CM | POA: Diagnosis present

## 2023-03-22 DIAGNOSIS — Z880 Allergy status to penicillin: Secondary | ICD-10-CM | POA: Diagnosis not present

## 2023-03-22 DIAGNOSIS — I10 Essential (primary) hypertension: Secondary | ICD-10-CM

## 2023-03-22 DIAGNOSIS — Z7982 Long term (current) use of aspirin: Secondary | ICD-10-CM | POA: Diagnosis not present

## 2023-03-22 DIAGNOSIS — K620 Anal polyp: Secondary | ICD-10-CM | POA: Diagnosis not present

## 2023-03-22 DIAGNOSIS — N189 Chronic kidney disease, unspecified: Secondary | ICD-10-CM | POA: Diagnosis present

## 2023-03-22 DIAGNOSIS — Z6823 Body mass index (BMI) 23.0-23.9, adult: Secondary | ICD-10-CM

## 2023-03-22 DIAGNOSIS — E46 Unspecified protein-calorie malnutrition: Secondary | ICD-10-CM

## 2023-03-22 DIAGNOSIS — K2950 Unspecified chronic gastritis without bleeding: Secondary | ICD-10-CM | POA: Diagnosis present

## 2023-03-22 DIAGNOSIS — K625 Hemorrhage of anus and rectum: Principal | ICD-10-CM

## 2023-03-22 DIAGNOSIS — K626 Ulcer of anus and rectum: Secondary | ICD-10-CM | POA: Diagnosis not present

## 2023-03-22 DIAGNOSIS — K921 Melena: Principal | ICD-10-CM | POA: Diagnosis present

## 2023-03-22 DIAGNOSIS — Z8673 Personal history of transient ischemic attack (TIA), and cerebral infarction without residual deficits: Secondary | ICD-10-CM

## 2023-03-22 DIAGNOSIS — K648 Other hemorrhoids: Secondary | ICD-10-CM | POA: Diagnosis present

## 2023-03-22 DIAGNOSIS — N179 Acute kidney failure, unspecified: Secondary | ICD-10-CM | POA: Diagnosis present

## 2023-03-22 DIAGNOSIS — K31A19 Gastric intestinal metaplasia without dysplasia, unspecified site: Secondary | ICD-10-CM | POA: Diagnosis not present

## 2023-03-22 DIAGNOSIS — F0390 Unspecified dementia without behavioral disturbance: Secondary | ICD-10-CM | POA: Diagnosis present

## 2023-03-22 DIAGNOSIS — E782 Mixed hyperlipidemia: Secondary | ICD-10-CM | POA: Diagnosis present

## 2023-03-22 DIAGNOSIS — I130 Hypertensive heart and chronic kidney disease with heart failure and stage 1 through stage 4 chronic kidney disease, or unspecified chronic kidney disease: Secondary | ICD-10-CM | POA: Diagnosis not present

## 2023-03-22 DIAGNOSIS — D649 Anemia, unspecified: Secondary | ICD-10-CM | POA: Diagnosis not present

## 2023-03-22 DIAGNOSIS — K622 Anal prolapse: Secondary | ICD-10-CM | POA: Diagnosis present

## 2023-03-22 DIAGNOSIS — I482 Chronic atrial fibrillation, unspecified: Secondary | ICD-10-CM | POA: Diagnosis present

## 2023-03-22 DIAGNOSIS — K623 Rectal prolapse: Secondary | ICD-10-CM | POA: Diagnosis present

## 2023-03-22 DIAGNOSIS — I509 Heart failure, unspecified: Secondary | ICD-10-CM | POA: Diagnosis not present

## 2023-03-22 DIAGNOSIS — K922 Gastrointestinal hemorrhage, unspecified: Secondary | ICD-10-CM | POA: Diagnosis not present

## 2023-03-22 DIAGNOSIS — K2970 Gastritis, unspecified, without bleeding: Secondary | ICD-10-CM | POA: Diagnosis not present

## 2023-03-22 LAB — CBC WITH DIFFERENTIAL/PLATELET
Abs Immature Granulocytes: 0.01 10*3/uL (ref 0.00–0.07)
Basophils Absolute: 0 10*3/uL (ref 0.0–0.1)
Basophils Relative: 1 %
Eosinophils Absolute: 0 10*3/uL (ref 0.0–0.5)
Eosinophils Relative: 0 %
HCT: 34 % — ABNORMAL LOW (ref 39.0–52.0)
Hemoglobin: 10.8 g/dL — ABNORMAL LOW (ref 13.0–17.0)
Immature Granulocytes: 0 %
Lymphocytes Relative: 23 %
Lymphs Abs: 1 10*3/uL (ref 0.7–4.0)
MCH: 27.6 pg (ref 26.0–34.0)
MCHC: 31.8 g/dL (ref 30.0–36.0)
MCV: 86.7 fL (ref 80.0–100.0)
Monocytes Absolute: 0.7 10*3/uL (ref 0.1–1.0)
Monocytes Relative: 16 %
Neutro Abs: 2.7 10*3/uL (ref 1.7–7.7)
Neutrophils Relative %: 60 %
Platelets: 190 10*3/uL (ref 150–400)
RBC: 3.92 MIL/uL — ABNORMAL LOW (ref 4.22–5.81)
RDW: 14.9 % (ref 11.5–15.5)
WBC: 4.4 10*3/uL (ref 4.0–10.5)
nRBC: 0 % (ref 0.0–0.2)

## 2023-03-22 LAB — PROTIME-INR
INR: 8 (ref 0.8–1.2)
Prothrombin Time: 66.9 s — ABNORMAL HIGH (ref 11.4–15.2)

## 2023-03-22 LAB — COMPREHENSIVE METABOLIC PANEL
ALT: 9 U/L (ref 0–44)
AST: 13 U/L — ABNORMAL LOW (ref 15–41)
Albumin: 2.4 g/dL — ABNORMAL LOW (ref 3.5–5.0)
Alkaline Phosphatase: 45 U/L (ref 38–126)
Anion gap: 10 (ref 5–15)
BUN: 28 mg/dL — ABNORMAL HIGH (ref 8–23)
CO2: 31 mmol/L (ref 22–32)
Calcium: 8.9 mg/dL (ref 8.9–10.3)
Chloride: 100 mmol/L (ref 98–111)
Creatinine, Ser: 1.84 mg/dL — ABNORMAL HIGH (ref 0.61–1.24)
GFR, Estimated: 40 mL/min — ABNORMAL LOW (ref >60)
Glucose, Bld: 95 mg/dL (ref 70–99)
Potassium: 3.8 mmol/L (ref 3.5–5.1)
Sodium: 141 mmol/L (ref 135–145)
Total Bilirubin: 0.4 mg/dL (ref 0.0–1.2)
Total Protein: 6.6 g/dL (ref 6.5–8.1)

## 2023-03-22 LAB — TYPE AND SCREEN
ABO/RH(D): B POS
Antibody Screen: NEGATIVE

## 2023-03-22 MED ORDER — PHYTONADIONE 5 MG PO TABS
2.5000 mg | ORAL_TABLET | Freq: Once | ORAL | Status: AC
  Administered 2023-03-22: 2.5 mg via ORAL
  Filled 2023-03-22: qty 1

## 2023-03-22 MED ORDER — VITAMIN K1 10 MG/ML IJ SOLN: 1.0000 mg | Freq: Once | INTRAVENOUS | Status: DC

## 2023-03-22 MED ORDER — PANTOPRAZOLE SODIUM 40 MG IV SOLR
40.0000 mg | Freq: Once | INTRAVENOUS | Status: AC
  Administered 2023-03-22: 40 mg via INTRAVENOUS
  Filled 2023-03-22: qty 10

## 2023-03-22 MED ORDER — SODIUM CHLORIDE 0.9 % IV BOLUS: 500.0000 mL | Freq: Once | INTRAVENOUS | Status: DC

## 2023-03-22 NOTE — H&P (Incomplete)
 History and Physical    [name] XBM:[id number] DOB: 1955-09-22 DOA: 03/22/2023 DOS: the patient was seen and examined on 03/23/2023 PCP: Rush Farmer, NP  Patient coming from: Group home  Chief Complaint:  Chief Complaint  Patient presents with   Rectal Bleeding   HPI: Nathan Shepherd is a 68 y.o. male with medical history significant of seizures, chronic A-fib on warfarin, polysubstance abuse, history of CVA, CKD 3A who presents to the emergency department due to 2-week onset of bright red blood in his stools.  He complained of generalized weakness, but denies fever, chills, chest pain, shortness of breath, abdominal pain.  ED Course:  In the emergency department, BP was 148/103, other vital signs were within normal range.  Workup in the ED showed normocytic anemia with hemoglobin 10.8/34.0 (this was 13.3/42.3 on 02/22/2023) and normal BMP except for BUN/creatinine of 20/1.84 (baseline creatinine at 1.3-1.5).  Albumin 2.4.  Guaiac test was positive per ED medical record. Patient was treated with IV Protonix 40 mg x 1, IV NS 500 mL was given.  Review of Systems: Review of systems as noted in the HPI. All other systems reviewed and are negative.   Past Medical History:  Diagnosis Date   CHF (congestive heart failure) (HCC)    CKD (chronic kidney disease) stage 3, GFR 30-59 ml/min (HCC)    Edema    Hyperlipemia    Hypertension    Seizures (HCC)    History reviewed. No pertinent surgical history.  Social History:  reports that he has an unknown smoking status. He has never used smokeless tobacco. He reports that he does not currently use alcohol. He reports that he does not use drugs.   Allergies  Allergen Reactions   Penicillins Hives    Tolerated Zosyn    History reviewed. No pertinent family history.   Prior to Admission medications   Medication Sig Start Date End Date Taking? Authorizing Provider  warfarin (COUMADIN) 7.5 MG tablet Take 7.5  mg by mouth daily. 02/23/23  Yes [provider]  acetaminophen (TYLENOL) 325 MG tablet Take 650 mg by mouth every 4 (four) hours as needed for mild pain (pain score 1-3).    [provider]  amLODipine (NORVASC) 2.5 MG tablet Take 1 tablet (2.5 mg total) by mouth daily. 08/28/21   Marinda Elk, MD  aspirin EC 81 MG tablet Take 81 mg by mouth daily.    [provider]  Calcium Carbonate Antacid (ANTACID PO) Take 30 mLs by mouth 4 (four) times daily as needed (heartburn, indigestion). Antacid Suspension    [provider]  carvedilol (COREG) 12.5 MG tablet Take 12.5 mg by mouth 2 (two) times daily with a meal.    [provider]  diphenhydrAMINE (BENADRYL) 25 MG tablet Take 25 mg by mouth every 6 (six) hours as needed for allergies or itching.    [provider]  divalproex (DEPAKOTE) 250 MG DR tablet Take 3 tablets (750 mg total) by mouth every 12 (twelve) hours. 10/24/22 11/23/22  Tresa Moore, MD  docusate sodium (COLACE) 100 MG capsule Take 100 mg by mouth at bedtime.    [provider]  donepezil (ARICEPT) 5 MG tablet Take 5 mg by mouth at bedtime.    [provider]  Emollient (EUCERIN) lotion Apply 1 Application topically daily. (Apply to legs and feet)    [provider]  fluocinolone 0.01 % cream Apply 1 application  topically 3 (three) times daily as needed (  bug bites/rash).    [provider]  guaiFENesin (ROBITUSSIN) 100 MG/5ML liquid Take 10 mLs by mouth every 6 (six) hours as needed for cough or to loosen phlegm.    [provider]  levETIRAcetam (KEPPRA) 1000 MG tablet Take 1,000 mg by mouth 2 (two) times daily.    [provider]  loperamide (IMODIUM) 2 MG capsule Take 1 capsule (2 mg total) by mouth as needed for diarrhea or loose stools. Patient taking differently: Take 2 mg by mouth as needed for diarrhea or loose stools. (Max 8 doses in 24 hours) 05/17/22    Lonia Blood, MD  losartan (COZAAR) 50 MG tablet Take 50 mg by mouth daily.    [provider]  magnesium hydroxide (MILK OF MAGNESIA) 400 MG/5ML suspension Take 30 mLs by mouth at bedtime as needed for mild constipation.    [provider]  NIFEdipine (PROCARDIA XL/NIFEDICAL XL) 60 MG 24 hr tablet Take 60 mg by mouth daily.    [provider]  rosuvastatin (CRESTOR) 20 MG tablet Take 20 mg by mouth at bedtime.     [provider]  SYMBICORT 160-4.5 MCG/ACT inhaler Inhale 2 puffs into the lungs. 02/23/23   [provider]  torsemide (DEMADEX) 20 MG tablet Take 20 mg by mouth daily.    [provider]  Vitamin D, Ergocalciferol, (DRISDOL) 1.25 MG (50000 UNIT) CAPS capsule Take 50,000 Units by mouth every Monday.    [provider]  warfarin (COUMADIN) 6 MG tablet Take 1 tablet (6 mg total) by mouth every evening. 10/24/22   Tresa Moore, MD    Physical Exam: BP (!) 149/106   Pulse 71   Temp 98.5 F (36.9 C) (Oral)   Resp 18   SpO2 94%   General: 68 y.o. year-old male well developed well nourished in no acute distress.  Alert and oriented x3. HEENT: NCAT, EOMI Neck: Supple, trachea medial Cardiovascular: Regular rate and rhythm with no rubs or gallops.  No thyromegaly or JVD noted.  No lower extremity edema. 2/4 pulses in all 4 extremities. Respiratory: Clear to auscultation with no wheezes or rales. Good inspiratory effort. Abdomen: Soft, nontender nondistended with normal bowel sounds x4 quadrants. Muskuloskeletal: No cyanosis, clubbing or edema noted bilaterally Neuro: CN II-XII intact, strength 5/5 x 4, sensation, reflexes intact Skin: No ulcerative lesions noted or rashes Psychiatry: Judgement and insight appear normal. Mood is appropriate for condition and setting          Labs on Admission:  Basic Metabolic Panel: Recent Labs  Lab 03/22/23 1945  NA 141  K 3.8  CL 100  CO2 31  GLUCOSE 95  BUN 28*   CREATININE 1.84*  CALCIUM 8.9   Liver Function Tests: Recent Labs  Lab 03/22/23 1945  AST 13*  ALT 9  ALKPHOS 45  BILITOT 0.4  PROT 6.6  ALBUMIN 2.4*   No results for input(s): "LIPASE", "AMYLASE" in the last 168 hours. No results for input(s): "AMMONIA" in the last 168 hours. CBC: Recent Labs  Lab 03/22/23 1945  WBC 4.4  NEUTROABS 2.7  HGB 10.8*  HCT 34.0*  MCV 86.7  PLT 190   Cardiac Enzymes: No results for input(s): "CKTOTAL", "CKMB", "CKMBINDEX", "TROPONINI" in the last 168 hours.  BNP (last 3 results) Recent Labs    02/22/23 1817  BNP 322.9*    ProBNP (last 3 results) No results for input(s): "PROBNP" in the last 8760 hours.  CBG: No results for input(s): "GLUCAP"  in the last 168 hours.  Radiological Exams on Admission: No results found.  EKG: I independently viewed the EKG done and my findings are as followed: EKG was not done in the ED  Assessment/Plan Present on Admission:  GI bleed  Acute kidney injury superimposed on chronic kidney disease (HCC)  Atrial fibrillation, chronic (HCC)  Principal Problem:   GI bleed Active Problems:   Atrial fibrillation, chronic (HCC)   Acute kidney injury superimposed on chronic kidney disease (HCC)   Seizure (HCC)   Supratherapeutic INR   Hypoalbuminemia due to protein-calorie malnutrition (HCC)   Essential hypertension   Mixed hyperlipidemia   History of CVA (cerebrovascular accident)  GI Bleed H/H= 10.8/34.0 (this was 13.3/42.3 on 02/22/2023) Hemoccult was positive Continue IV Protonix Patient will be kept n.p.o. pending consult with gastroenterologist Gastroenterologist  will be consulted in the morning  Supratherapeutic INR INR 8.0; vitamin K 2.5 mg p.o. x 1 was given Continue to monitor INR  Hypoalbuminemia possible secondary to moderate protein calorie malnutrition Albumin 2.4, protein supplement will be provided  Acute kidney injury on CKD 3B BUN/creatinine of 20/1.84 (baseline  creatinine at 1.3-1.5) Renally adjust medications, avoid nephrotoxic agents/dehydration/hypotension  Essential hypertension Continue Norvasc, Coreg, losartan  Mixed hyperlipidemia/history of CVA Continue Crestor  Chronic atrial fibrillation  Continue Coreg Warfarin will be temporarily held at this time due to supratherapeutic INR  Seizures Continue Keppra   DVT prophylaxis: SCDs  Code Status: Full code  Family Communication: None at bedside  Consults: Gastroenterology  Severity of Illness: The appropriate patient status for this patient is INPATIENT. Inpatient status is judged to be reasonable and necessary in order to provide the required intensity of service to ensure the patient's safety. The patient's presenting symptoms, physical exam findings, and initial radiographic and laboratory data in the context of their chronic comorbidities is felt to place them at high risk for further clinical deterioration. Furthermore, it is not anticipated that the patient will be medically stable for discharge from the hospital within 2 midnights of admission.   * I certify that at the point of admission it is my clinical judgment that the patient will require inpatient hospital care spanning beyond 2 midnights from the point of admission due to high intensity of service, high risk for further deterioration and high frequency of surveillance required.*  Author: Frankey Shown, DO 03/23/2023 4:17 AM  For on call review [email or web address].

## 2023-03-22 NOTE — ED Provider Notes (Signed)
 [facility] AT [facility] Provider Note   CSN: [id number] Arrival date & time: 03/22/23  1903     History  Chief Complaint  Patient presents with   Rectal Bleeding    Nathan Shepherd is a 68 y.o. male.  Patient is a 68 year old male who presents to the emergency department with a chief complaint of bright red blood in his stools for approximate the past 2 weeks.  Patient is currently on warfarin at this time for history of atrial fibrillation.  Patient notes that he has had no associate abdominal pain, nausea, vomiting or diarrhea.  He denies any associated fever, chills, chest pain or shortness of breath.  He has had no associated dizziness, lightheadedness or syncope.  He does admit to generalized weakness.   Rectal Bleeding      Home Medications Prior to Admission medications   Medication Sig Start Date End Date Taking? Authorizing Provider  acetaminophen (TYLENOL) 325 MG tablet Take 650 mg by mouth every 4 (four) hours as needed for mild pain (pain score 1-3).    [provider]  amLODipine (NORVASC) 2.5 MG tablet Take 1 tablet (2.5 mg total) by mouth daily. 08/28/21   Marinda Elk, MD  aspirin EC 81 MG tablet Take 81 mg by mouth daily.    [provider]  Calcium Carbonate Antacid (ANTACID PO) Take 30 mLs by mouth 4 (four) times daily as needed (heartburn, indigestion). Antacid Suspension    [provider]  carvedilol (COREG) 12.5 MG tablet Take 12.5 mg by mouth 2 (two) times daily with a meal.    [provider]  diphenhydrAMINE (BENADRYL) 25 MG tablet Take 25 mg by mouth every 6 (six) hours as needed for allergies or itching.    [provider]  divalproex (DEPAKOTE) 250 MG DR tablet Take 3 tablets (750 mg total) by mouth every 12 (twelve) hours. 10/24/22 11/23/22  Tresa Moore, MD  docusate sodium (COLACE) 100 MG capsule Take 100 mg by mouth at bedtime.    [provider]  donepezil (ARICEPT) 5 MG tablet Take 5 mg by mouth at bedtime.    [provider]  Emollient (EUCERIN) lotion Apply 1 Application topically daily. (Apply to legs and feet)    [provider]  fluocinolone 0.01 % cream Apply 1 application  topically 3 (three) times daily as needed (bug bites/rash).    [provider]  guaiFENesin (ROBITUSSIN) 100 MG/5ML liquid Take 10 mLs by mouth every 6 (six) hours as needed for cough or to loosen phlegm.    [provider]  levETIRAcetam (KEPPRA) 1000 MG tablet Take 1,000 mg by mouth 2 (two) times daily.    [provider]  loperamide (IMODIUM) 2 MG capsule Take 1 capsule (2 mg total) by mouth as needed for diarrhea or loose stools. Patient taking differently: Take 2 mg by mouth as needed for diarrhea or loose stools. (Max 8 doses in 24 hours) 05/17/22   Lonia Blood, MD  losartan (COZAAR) 50 MG tablet Take 50 mg by mouth daily.    [provider]  magnesium hydroxide (MILK OF MAGNESIA) 400 MG/5ML suspension Take 30 mLs by mouth at bedtime as needed for mild constipation.    [provider]  NIFEdipine (PROCARDIA XL/NIFEDICAL XL) 60 MG 24 hr tablet Take 60 mg by mouth daily.    [provider]  rosuvastatin (CRESTOR) 20 MG tablet Take 20 mg by mouth at bedtime.  [provider]  torsemide (DEMADEX) 20 MG tablet Take 20 mg by mouth daily.    [provider]  Vitamin D, Ergocalciferol, (DRISDOL) 1.25 MG (50000 UNIT) CAPS capsule Take 50,000 Units by mouth every Monday.    [provider]  warfarin (COUMADIN) 6 MG tablet Take 1 tablet (6 mg total) by mouth every evening. 10/24/22   Tresa Moore, MD      Allergies    Penicillins    Review of Systems   Review of Systems  Gastrointestinal:  Positive for blood in stool and hematochezia.  All other systems reviewed and are negative.   Physical Exam Updated Vital Signs BP (!) 150/110 (BP  Location: Right Arm)   Pulse 81   Temp 98.9 F (37.2 C) (Oral)   Resp 18   SpO2 95%  Physical Exam Vitals and nursing note reviewed. Exam conducted with a chaperone present.  Constitutional:      Appearance: Normal appearance.  HENT:     Head: Normocephalic and atraumatic.     Nose: Nose normal.     Mouth/Throat:     Mouth: Mucous membranes are moist.  Eyes:     Extraocular Movements: Extraocular movements intact.     Conjunctiva/sclera: Conjunctivae normal.     Pupils: Pupils are equal, round, and reactive to light.  Cardiovascular:     Rate and Rhythm: Normal rate and regular rhythm.     Pulses: Normal pulses.     Heart sounds: Normal heart sounds. No murmur heard.    No gallop.  Pulmonary:     Effort: Pulmonary effort is normal. No respiratory distress.     Breath sounds: Normal breath sounds. No stridor. No wheezing or rales.  Abdominal:     General: Abdomen is flat. Bowel sounds are normal. There is no distension.     Palpations: Abdomen is soft. There is no mass.     Tenderness: There is no abdominal tenderness. There is no guarding.  Genitourinary:    Rectum: Guaiac result positive.     Comments: Rectal exam performed with nurse chaperone, rectal lesion noted which is nonbleeding at this time, no lesions noted within the rectal vault to palpation, brown stool in the rectal vault, no active bleeding Musculoskeletal:        General: Normal range of motion.     Cervical back: Normal range of motion and neck supple.  Skin:    General: Skin is warm and dry.     Findings: No bruising or rash.  Neurological:     General: No focal deficit present.     Mental Status: He is alert and oriented to person, place, and time. Mental status is at baseline.  Psychiatric:        Mood and Affect: Mood normal.        Behavior: Behavior normal.        Thought Content: Thought content normal.        Judgment: Judgment normal.     ED Results / Procedures / Treatments   Labs (all  labs ordered are listed, but only abnormal results are displayed) Labs Reviewed  CBC WITH DIFFERENTIAL/PLATELET - Abnormal; Notable for the following components:      Result Value   RBC 3.92 (*)    Hemoglobin 10.8 (*)    HCT 34.0 (*)    All other components within normal limits  COMPREHENSIVE METABOLIC PANEL - Abnormal; Notable for the following components:   BUN 28 (*)  Creatinine, Ser 1.84 (*)    Albumin 2.4 (*)    AST 13 (*)    GFR, Estimated 40 (*)    All other components within normal limits  PROTIME-INR - Abnormal; Notable for the following components:   Prothrombin Time 66.9 (*)    INR 8.0 (*)    All other components within normal limits  I-STAT CHEM 8, ED  TYPE AND SCREEN    EKG None  Radiology No results found.  Procedures Procedures    Medications Ordered in ED Medications  sodium chloride 0.9 % bolus 500 mL (has no administration in time range)  pantoprazole (PROTONIX) injection 40 mg (has no administration in time range)  phytonadione (VITAMIN K) 1 mg in dextrose 5 % 50 mL IVPB (has no administration in time range)    ED Course/ Medical Decision Making/ A&P                                 Medical Decision Making Amount and/or Complexity of Data Reviewed Labs: ordered.  Risk Prescription drug management. Decision regarding hospitalization.   This patient presents to the ED for concern of bleeding, this involves an extensive number of treatment options, and is a complaint that carries with it a high risk of complications and morbidity.  The differential diagnosis includes lower GI bleed, upper GI bleed, supratherapeutic INR, diverticulitis, colitis, hemorrhoids   Co morbidities that complicate the patient evaluation  Chronic anticoagulation   Additional history obtained:  Additional history obtained from none External records from outside source obtained and reviewed including none   Lab Tests:  I Ordered, and personally interpreted  labs.  The pertinent results include: Anemia, elevated INR, elevated creatinine, normal liver function   Consultations Obtained:  I requested consultation with the hospitalist,  and discussed lab and imaging findings as well as pertinent plan - they recommend: Admission   Problem List / ED Course / Critical interventions / Medication management  Patient is doing well at this time.  Discussed with patient we will plan for admission to the hospitalist service.  Patient does have a supratherapeutic INR at this time and will give vitamin K.  Patient has stable vital signs with no associated tachycardia and no hypotension.  Patient's abdominal exam is benign with no focal tenderness throughout.  Rectal exam performed with nurse chaperone demonstrated no active bleeding but was Hemoccult positive.  He did have a rectal lesion noted on exam.  Patient case was discussed with Dr. Thomes Dinning with the hospitalist service for admission at this time. I ordered medication including Protonix, vitamin K for GI bleed, supratherapeutic INR Reevaluation of the patient after these medicines showed that the patient improved I have reviewed the patients home medicines and have made adjustments as needed   Social Determinants of Health:  None   Test / Admission - Considered:  Admission       Final Clinical Impression(s) / ED Diagnoses Final diagnoses:  None    Rx / DC Orders ED Discharge Orders     None         Kathlen Mody 03/22/23 2152    Bethann Berkshire, MD 03/24/23 1323

## 2023-03-22 NOTE — ED Notes (Signed)
Ice cream given  

## 2023-03-22 NOTE — ED Triage Notes (Signed)
 Bloody stool x 2 weeks, red in color "somewhere inbetween bright and dark red". denies any pain or other concerns. VSS.

## 2023-03-23 ENCOUNTER — Encounter (HOSPITAL_COMMUNITY): Payer: Self-pay | Admitting: Internal Medicine

## 2023-03-23 DIAGNOSIS — E8809 Other disorders of plasma-protein metabolism, not elsewhere classified: Secondary | ICD-10-CM

## 2023-03-23 DIAGNOSIS — D649 Anemia, unspecified: Secondary | ICD-10-CM

## 2023-03-23 DIAGNOSIS — R791 Abnormal coagulation profile: Secondary | ICD-10-CM | POA: Diagnosis not present

## 2023-03-23 DIAGNOSIS — N179 Acute kidney failure, unspecified: Secondary | ICD-10-CM | POA: Diagnosis not present

## 2023-03-23 DIAGNOSIS — I482 Chronic atrial fibrillation, unspecified: Secondary | ICD-10-CM | POA: Diagnosis not present

## 2023-03-23 DIAGNOSIS — Z8673 Personal history of transient ischemic attack (TIA), and cerebral infarction without residual deficits: Secondary | ICD-10-CM

## 2023-03-23 DIAGNOSIS — N189 Chronic kidney disease, unspecified: Secondary | ICD-10-CM

## 2023-03-23 DIAGNOSIS — K625 Hemorrhage of anus and rectum: Secondary | ICD-10-CM | POA: Diagnosis not present

## 2023-03-23 DIAGNOSIS — I10 Essential (primary) hypertension: Secondary | ICD-10-CM

## 2023-03-23 DIAGNOSIS — E782 Mixed hyperlipidemia: Secondary | ICD-10-CM

## 2023-03-23 DIAGNOSIS — E46 Unspecified protein-calorie malnutrition: Secondary | ICD-10-CM | POA: Insufficient documentation

## 2023-03-23 DIAGNOSIS — K921 Melena: Secondary | ICD-10-CM | POA: Diagnosis not present

## 2023-03-23 DIAGNOSIS — R569 Unspecified convulsions: Secondary | ICD-10-CM

## 2023-03-23 LAB — COMPREHENSIVE METABOLIC PANEL
ALT: 9 U/L (ref 0–44)
AST: 11 U/L — ABNORMAL LOW (ref 15–41)
Albumin: 2.1 g/dL — ABNORMAL LOW (ref 3.5–5.0)
Alkaline Phosphatase: 43 U/L (ref 38–126)
Anion gap: 6 (ref 5–15)
BUN: 22 mg/dL (ref 8–23)
CO2: 31 mmol/L (ref 22–32)
Calcium: 8.7 mg/dL — ABNORMAL LOW (ref 8.9–10.3)
Chloride: 105 mmol/L (ref 98–111)
Creatinine, Ser: 1.38 mg/dL — ABNORMAL HIGH (ref 0.61–1.24)
GFR, Estimated: 57 mL/min — ABNORMAL LOW (ref >60)
Glucose, Bld: 93 mg/dL (ref 70–99)
Potassium: 3.3 mmol/L — ABNORMAL LOW (ref 3.5–5.1)
Sodium: 142 mmol/L (ref 135–145)
Total Bilirubin: 0.6 mg/dL (ref 0.0–1.2)
Total Protein: 6 g/dL — ABNORMAL LOW (ref 6.5–8.1)

## 2023-03-23 LAB — PROTIME-INR
INR: 6.1 (ref 0.8–1.2)
INR: 2.3 — ABNORMAL HIGH (ref 0.8–1.2)
Prothrombin Time: 54.7 s — ABNORMAL HIGH (ref 11.4–15.2)
Prothrombin Time: 25.4 s — ABNORMAL HIGH (ref 11.4–15.2)

## 2023-03-23 LAB — CBC
HCT: 33.1 % — ABNORMAL LOW (ref 39.0–52.0)
Hemoglobin: 10.5 g/dL — ABNORMAL LOW (ref 13.0–17.0)
MCH: 27.3 pg (ref 26.0–34.0)
MCHC: 31.7 g/dL (ref 30.0–36.0)
MCV: 86.2 fL (ref 80.0–100.0)
Platelets: 171 10*3/uL (ref 150–400)
RBC: 3.84 MIL/uL — ABNORMAL LOW (ref 4.22–5.81)
RDW: 14.7 % (ref 11.5–15.5)
WBC: 5.2 10*3/uL (ref 4.0–10.5)
nRBC: 0 % (ref 0.0–0.2)

## 2023-03-23 LAB — PHOSPHORUS: Phosphorus: 2.3 mg/dL — ABNORMAL LOW (ref 2.5–4.6)

## 2023-03-23 LAB — MAGNESIUM: Magnesium: 1.7 mg/dL (ref 1.7–2.4)

## 2023-03-23 MED ORDER — ROSUVASTATIN CALCIUM 20 MG PO TABS
20.0000 mg | ORAL_TABLET | Freq: Every day | ORAL | Status: DC
  Administered 2023-03-23 – 2023-03-24 (×2): 20 mg via ORAL
  Filled 2023-03-23 (×2): qty 1

## 2023-03-23 MED ORDER — AMLODIPINE BESYLATE 5 MG PO TABS
2.5000 mg | ORAL_TABLET | Freq: Every day | ORAL | Status: DC
  Administered 2023-03-23 – 2023-03-25 (×3): 2.5 mg via ORAL
  Filled 2023-03-23 (×3): qty 1

## 2023-03-23 MED ORDER — ENSURE ENLIVE PO LIQD
237.0000 mL | Freq: Two times a day (BID) | ORAL | Status: DC
Start: 2023-03-23 — End: 2023-03-25
  Administered 2023-03-25 (×2): 237 mL via ORAL
  Filled 2023-03-23 (×6): qty 237

## 2023-03-23 MED ORDER — HYDRALAZINE HCL 20 MG/ML IJ SOLN
10.0000 mg | INTRAMUSCULAR | Status: DC | PRN
  Administered 2023-03-23: 10 mg via INTRAVENOUS
  Filled 2023-03-23: qty 0.5

## 2023-03-23 MED ORDER — LOSARTAN POTASSIUM 50 MG PO TABS
50.0000 mg | ORAL_TABLET | Freq: Every day | ORAL | Status: DC
Start: 2023-03-23 — End: 2023-03-24
  Administered 2023-03-23: 50 mg via ORAL
  Filled 2023-03-23: qty 2
  Filled 2023-03-23: qty 1

## 2023-03-23 MED ORDER — PEG 3350-KCL-NA BICARB-NACL 420 G PO SOLR
4000.0000 mL | Freq: Once | ORAL | Status: AC
  Administered 2023-03-23: 4000 mL via ORAL

## 2023-03-23 MED ORDER — BISACODYL 5 MG PO TBEC
10.0000 mg | DELAYED_RELEASE_TABLET | Freq: Once | ORAL | Status: AC
  Administered 2023-03-23: 10 mg via ORAL
  Filled 2023-03-23: qty 2

## 2023-03-23 MED ORDER — SODIUM CHLORIDE 0.9 % IV SOLN: INTRAVENOUS | Status: DC

## 2023-03-23 MED ORDER — VITAMIN K1 10 MG/ML IJ SOLN
5.0000 mg | Freq: Once | INTRAVENOUS | Status: AC
  Administered 2023-03-23: 5 mg via INTRAVENOUS
  Filled 2023-03-23: qty 0.5

## 2023-03-23 MED ORDER — LEVETIRACETAM 500 MG PO TABS
1000.0000 mg | ORAL_TABLET | Freq: Two times a day (BID) | ORAL | Status: DC
  Administered 2023-03-23 – 2023-03-25 (×5): 1000 mg via ORAL
  Filled 2023-03-23 (×5): qty 2

## 2023-03-23 MED ORDER — PANTOPRAZOLE SODIUM 40 MG IV SOLR
40.0000 mg | Freq: Two times a day (BID) | INTRAVENOUS | Status: DC
  Administered 2023-03-23 – 2023-03-24 (×3): 40 mg via INTRAVENOUS
  Filled 2023-03-23 (×3): qty 10

## 2023-03-23 MED ORDER — CARVEDILOL 12.5 MG PO TABS
12.5000 mg | ORAL_TABLET | Freq: Two times a day (BID) | ORAL | Status: DC
  Administered 2023-03-23 – 2023-03-25 (×4): 12.5 mg via ORAL
  Filled 2023-03-23 (×4): qty 1

## 2023-03-23 NOTE — Progress Notes (Signed)
 PROGRESS NOTE    Reason Helzer  ZOX:[id number] DOB: November 05, 1955 DOA: 03/22/2023 PCP: Rush Farmer, NP   Brief Narrative:  Nathan Shepherd is a 68 y.o. male with medical history significant of seizures, chronic A-fib on warfarin, polysubstance abuse, history of CVA, CKD 3A who presents to the emergency department due to 2-week onset of bright red blood in his stools. Hospitalist called for admission, GI called in consult.  Assessment & Plan:   Principal Problem:   GI bleed Active Problems:   Atrial fibrillation, chronic (HCC)   Acute kidney injury superimposed on chronic kidney disease (HCC)   Seizure (HCC)   Supratherapeutic INR   Hypoalbuminemia due to protein-calorie malnutrition (HCC)   Essential hypertension   Mixed hyperlipidemia   History of CVA (cerebrovascular accident)  Acute GI bleeding, symptomatic anemia, POA Supratherapeutic INR -Baseline hemoglobin 13 -Downtrending, currently 10 -GI consulted given heme positive stool -Continue vitamin K, repeat 5mg  IV x 1 -INR downtrending appropriately, remains elevated at 6.1(8 at admission)  Hypoalbuminemia possible secondary to moderate protein calorie malnutrition Albumin 2.4, protein supplement will be provided   Acute kidney injury on CKD 3B Cr 1.84 (baseline creatinine at 1.3) Continue supportive care/advance diet as able; IVF if unable to tolerate PO   Essential hypertension Continue Norvasc, Coreg, losartan   Mixed hyperlipidemia/history of CVA Continue Statin Not on asa/plavix given warfarin dosing   Chronic atrial fibrillation  Continue Coreg Hold warfarin given INR elevation (see above)   Seizures Continue Keppra    DVT prophylaxis: SCDs Start: 03/23/23 0357   Code Status:   Code Status: Full Code  Family Communication: None  Status is: Inpatient  Dispo: The patient is from: Group home              Anticipated d/c is to: Same              Anticipated d/c date is: 48 to 72  hours              Patient currently not medically stable for discharge  Consultants:  GI  Procedures:  Pending above  Antimicrobials:  None indicated  Subjective: No acute issues or events overnight, patient denies nausea vomiting diarrhea, chills or chest pain.  Denies any further episodes of, black tarry/red stools  Objective: Vitals:   03/23/23 0515 03/23/23 0615 03/23/23 0645 03/23/23 0721  BP: (!) 146/92 (!) 135/97 (!) 154/94   Pulse: (!) 57 65 70   Resp: 18  18 17   Temp:    98.2 F (36.8 C)  TempSrc:    Oral  SpO2: 96% 92% 93% 95%   No intake or output data in the 24 hours ending 03/23/23 0727 There were no vitals filed for this visit.  Examination:  General:  Pleasantly resting in bed, No acute distress. HEENT:  Normocephalic atraumatic.  Sclerae nonicteric, noninjected.  Extraocular movements intact bilaterally. Neck:  Without mass or deformity.  Trachea is midline. Lungs:  Clear to auscultate bilaterally without rhonchi, wheeze, or rales. Heart:  Regular rate and rhythm.  Without murmurs, rubs, or gallops. Abdomen:  Soft, nontender, nondistended.  Without guarding or rebound. Extremities: Without cyanosis, clubbing, edema, or obvious deformity. Skin:  Warm and dry, no erythema.  Data Reviewed: I have personally reviewed following labs and imaging studies  CBC: Recent Labs  Lab 03/22/23 1945 03/23/23 0536  WBC 4.4 5.2  NEUTROABS 2.7  --   HGB 10.8* 10.5*  HCT 34.0* 33.1*  MCV 86.7 86.2  PLT 190  171   Basic Metabolic Panel: Recent Labs  Lab 03/22/23 1945 03/23/23 0536  NA 141 142  K 3.8 3.3*  CL 100 105  CO2 31 31  GLUCOSE 95 93  BUN 28* 22  CREATININE 1.84* 1.38*  CALCIUM 8.9 8.7*  MG  --  1.7  PHOS  --  2.3*   GFR: CrCl cannot be calculated (Unknown ideal weight.).  Liver Function Tests: Recent Labs  Lab 03/22/23 1945 03/23/23 0536  AST 13* 11*  ALT 9 9  ALKPHOS 45 43  BILITOT 0.4 0.6  PROT 6.6 6.0*  ALBUMIN 2.4* 2.1*    Coagulation Profile: Recent Labs  Lab 03/22/23 1945 03/23/23 0536  INR 8.0* 6.1*   No results found for this or any previous visit (from the past 240 hours).   Radiology Studies: No results found.  Scheduled Meds:  feeding supplement  237 mL Oral BID BM   pantoprazole (PROTONIX) IV  40 mg Intravenous Q12H   Continuous Infusions:  phytonadione (VITAMIN K) 10 mg in dextrose 5 % 50 mL IVPB     sodium chloride       LOS: 1 day   Time spent:  Azucena Fallen, DO [facility]  If 7PM-7AM, please contact night-coverage [email or web address]  03/23/2023, 7:27 AM

## 2023-03-23 NOTE — Consult Note (Addendum)
 Gastroenterology Consult   Referring Provider: No ref. provider found Primary Care Physician:  Rush Farmer, NP Primary Gastroenterologist:  previously unassigned (Dr. Tasia Catchings)  Patient ID: Nathan Shepherd; [id number]; February 01, 1955   Admit date: 03/22/2023  LOS: 1 day   Date of Consultation: 03/23/2023  Reason for Consultation:  GI bleed    History of Present Illness   Nathan Shepherd is a 68 y.o. male with past medical history significant for CHF, CKD, hypertension, hyperlipidemia, seizures, A-fib on Coumadin, dementia, prior polysubstance abuse, left occipital CVA who presented to the emergency department yesterday from his group home due to 2-week history of bright red blood in the stools.  Complained of generalized weakness.  In the ED: Blood pressure 150/110, pulse 81.  Hemoglobin 10.8 have been 13.3 one month prior.  Platelets 190,000.  BUN 28, creatinine 1.84 up from 1.4 one month prior. Stool was Hemoccult positive.  INR was 8.0, 1 month prior was 3.4.  Vitamin K 2.5 mg p.o. provided.  Today's INR is down to 6.1.  Potassium 3.3, BUN 22, creatinine 1.38, hemoglobin 10.5, magnesium 1.7.  Patient started on pantoprazole 40 mg IV every 12 hours.  He was given an additional dose of vitamin K, 5 mg IV at 8 AM this morning.  GI consult: Patient has a legal guardian, listed in the chart.  He is alert and oriented to person, place, time.  He states he has been seeing blood in his stools off and on for 2 weeks.  Last bowel movement yesterday.  Does not see it every time.  No melena.  Denies constipation or diarrhea.  Denies abdominal pain.  Stated his appetite has been good, he is hungry right now.  He would like to have some to eat.  Denies any reflux.  No dysphagia.  Denies blood in the urine.  Denies nosebleeds.  Denies history of colonoscopy or upper endoscopy.   Prior to Admission medications   Medication Sig Start Date End Date Taking? Authorizing Provider  warfarin  (COUMADIN) 7.5 MG tablet Take 7.5 mg by mouth daily. 02/23/23  Yes [provider]  acetaminophen (TYLENOL) 325 MG tablet Take 650 mg by mouth every 4 (four) hours as needed for mild pain (pain score 1-3).    [provider]  amLODipine (NORVASC) 2.5 MG tablet Take 1 tablet (2.5 mg total) by mouth daily. 08/28/21   Marinda Elk, MD  aspirin EC 81 MG tablet Take 81 mg by mouth daily.    [provider]  Calcium Carbonate Antacid (ANTACID PO) Take 30 mLs by mouth 4 (four) times daily as needed (heartburn, indigestion). Antacid Suspension    [provider]  carvedilol (COREG) 12.5 MG tablet Take 12.5 mg by mouth 2 (two) times daily with a meal.    [provider]  diphenhydrAMINE (BENADRYL) 25 MG tablet Take 25 mg by mouth every 6 (six) hours as needed for allergies or itching.    [provider]  divalproex (DEPAKOTE) 250 MG DR tablet Take 3 tablets (750 mg total) by mouth every 12 (twelve) hours. 10/24/22 11/23/22  Tresa Moore, MD  docusate sodium (COLACE) 100 MG capsule Take 100 mg by mouth at bedtime.    [provider]  donepezil (ARICEPT) 5 MG tablet Take 5 mg by mouth at bedtime.    [provider]  Emollient (EUCERIN) lotion Apply 1 Application topically daily. (Apply to legs and feet)    [provider]  fluocinolone 0.01 %  cream Apply 1 application  topically 3 (three) times daily as needed (bug bites/rash).    [provider]  guaiFENesin (ROBITUSSIN) 100 MG/5ML liquid Take 10 mLs by mouth every 6 (six) hours as needed for cough or to loosen phlegm.    [provider]  levETIRAcetam (KEPPRA) 1000 MG tablet Take 1,000 mg by mouth 2 (two) times daily.    [provider]  loperamide (IMODIUM) 2 MG capsule Take 1 capsule (2 mg total) by mouth as needed for diarrhea or loose stools. Patient taking differently: Take 2 mg by mouth as needed for diarrhea or loose stools. (Max 8  doses in 24 hours) 05/17/22   Lonia Blood, MD  losartan (COZAAR) 50 MG tablet Take 50 mg by mouth daily.    [provider]  magnesium hydroxide (MILK OF MAGNESIA) 400 MG/5ML suspension Take 30 mLs by mouth at bedtime as needed for mild constipation.    [provider]  NIFEdipine (PROCARDIA XL/NIFEDICAL XL) 60 MG 24 hr tablet Take 60 mg by mouth daily.    [provider]  rosuvastatin (CRESTOR) 20 MG tablet Take 20 mg by mouth at bedtime.     [provider]  SYMBICORT 160-4.5 MCG/ACT inhaler Inhale 2 puffs into the lungs. 02/23/23   [provider]  torsemide (DEMADEX) 20 MG tablet Take 20 mg by mouth daily.    [provider]  Vitamin D, Ergocalciferol, (DRISDOL) 1.25 MG (50000 UNIT) CAPS capsule Take 50,000 Units by mouth every Monday.    [provider]  warfarin (COUMADIN) 6 MG tablet Take 1 tablet (6 mg total) by mouth every evening. 10/24/22   Tresa Moore, MD    Current Facility-Administered Medications  Medication Dose Route Frequency Provider Last Rate Last Admin   feeding supplement (ENSURE ENLIVE / ENSURE PLUS) liquid 237 mL  237 mL Oral BID BM Adefeso, Oladapo, DO       pantoprazole (PROTONIX) injection 40 mg  40 mg Intravenous Q12H Adefeso, Oladapo, DO       phytonadione (VITAMIN K) 5 mg in dextrose 5 % 50 mL IVPB  5 mg Intravenous Once Azucena Fallen, MD       sodium chloride 0.9 % bolus 500 mL  500 mL Intravenous Once Bethann Berkshire, MD       Current Outpatient Medications  Medication Sig Dispense Refill   warfarin (COUMADIN) 7.5 MG tablet Take 7.5 mg by mouth daily.     acetaminophen (TYLENOL) 325 MG tablet Take 650 mg by mouth every 4 (four) hours as needed for mild pain (pain score 1-3).     amLODipine (NORVASC) 2.5 MG tablet Take 1 tablet (2.5 mg total) by mouth daily. 30 tablet 0   aspirin EC 81 MG tablet Take 81 mg by mouth daily.     Calcium Carbonate Antacid (ANTACID PO) Take 30 mLs by  mouth 4 (four) times daily as needed (heartburn, indigestion). Antacid Suspension     carvedilol (COREG) 12.5 MG tablet Take 12.5 mg by mouth 2 (two) times daily with a meal.     diphenhydrAMINE (BENADRYL) 25 MG tablet Take 25 mg by mouth every 6 (six) hours as needed for allergies or itching.     divalproex (DEPAKOTE) 250 MG DR tablet Take 3 tablets (750 mg total) by mouth every 12 (twelve) hours. 180 tablet 0   docusate sodium (COLACE) 100 MG capsule Take 100 mg by mouth at bedtime.     donepezil (ARICEPT) 5 MG tablet Take  5 mg by mouth at bedtime.     Emollient (EUCERIN) lotion Apply 1 Application topically daily. (Apply to legs and feet)     fluocinolone 0.01 % cream Apply 1 application  topically 3 (three) times daily as needed (bug bites/rash).     guaiFENesin (ROBITUSSIN) 100 MG/5ML liquid Take 10 mLs by mouth every 6 (six) hours as needed for cough or to loosen phlegm.     levETIRAcetam (KEPPRA) 1000 MG tablet Take 1,000 mg by mouth 2 (two) times daily.     loperamide (IMODIUM) 2 MG capsule Take 1 capsule (2 mg total) by mouth as needed for diarrhea or loose stools. (Patient taking differently: Take 2 mg by mouth as needed for diarrhea or loose stools. (Max 8 doses in 24 hours)) 30 capsule 0   losartan (COZAAR) 50 MG tablet Take 50 mg by mouth daily.     magnesium hydroxide (MILK OF MAGNESIA) 400 MG/5ML suspension Take 30 mLs by mouth at bedtime as needed for mild constipation.     NIFEdipine (PROCARDIA XL/NIFEDICAL XL) 60 MG 24 hr tablet Take 60 mg by mouth daily.     rosuvastatin (CRESTOR) 20 MG tablet Take 20 mg by mouth at bedtime.      SYMBICORT 160-4.5 MCG/ACT inhaler Inhale 2 puffs into the lungs.     torsemide (DEMADEX) 20 MG tablet Take 20 mg by mouth daily.     Vitamin D, Ergocalciferol, (DRISDOL) 1.25 MG (50000 UNIT) CAPS capsule Take 50,000 Units by mouth every Monday.     warfarin (COUMADIN) 6 MG tablet Take 1 tablet (6 mg total) by mouth every evening.      Allergies as of  03/22/2023 - Reviewed 03/22/2023  Allergen Reaction Noted   Penicillins Hives 11/20/2015    Past Medical History:  Diagnosis Date   CHF (congestive heart failure) (HCC)    CKD (chronic kidney disease) stage 3, GFR 30-59 ml/min (HCC)    Edema    Hyperlipemia    Hypertension    Seizures (HCC)     Past Surgical History:  Procedure Laterality Date   NO PAST SURGERIES      Family History  Problem Relation Age of Onset   Colon cancer Neg Hx    Liver disease Neg Hx     Social History   Socioeconomic History   Marital status: Single    Spouse name: Not on file   Number of children: Not on file   Years of education: Not on file   Highest education level: Not on file  Occupational History   Not on file  Tobacco Use   Smoking status: Unknown   Smokeless tobacco: Never  Substance and Sexual Activity   Alcohol use: Not Currently   Drug use: Never   Sexual activity: Not Currently  Other Topics Concern   Not on file  Social History Narrative   ** Merged History Encounter **       Social Drivers of Health   Financial Resource Strain: Not on file  Food Insecurity: Not on file  Transportation Needs: Not on file  Physical Activity: Not on file  Stress: Not on file  Social Connections: Not on file  Intimate Partner Violence: Not on file     Review of System:   General: Negative for anorexia, weight loss, fever, chills, fatigue, weakness. Eyes: Negative for vision changes.  ENT: Negative for hoarseness, difficulty swallowing , nasal congestion. CV: Negative for chest pain, angina, palpitations, dyspnea on exertion, peripheral edema.  Respiratory:  Negative for dyspnea at rest, dyspnea on exertion, cough, sputum, wheezing.  GI: See history of present illness. GU:  Negative for dysuria, hematuria, urinary incontinence, urinary frequency, nocturnal urination.  MS: Negative for joint pain, low back pain.  Derm: Negative for rash or itching.  Neuro: Negative for weakness,  abnormal sensation, seizure, frequent headaches, memory loss, confusion.  Psych: Negative for anxiety, depression, suicidal ideation, hallucinations.  Endo: Negative for unusual weight change.  Heme: Negative for bruising or bleeding. Allergy: Negative for rash or hives.      Physical Examination:   Vital signs in last 24 hours: Temp:  [98.2 F (36.8 C)-98.9 F (37.2 C)] 98.2 F (36.8 C) (03/20 0721) Pulse Rate:  [57-83] 73 (03/20 0800) Resp:  [17-18] 17 (03/20 0721) BP: (129-171)/(87-110) 141/92 (03/20 0800) SpO2:  [92 %-96 %] 95 % (03/20 0800) Last BM Date : 03/22/23  General: Well-nourished, well-developed in no acute distress.  Head: Normocephalic, atraumatic.   Eyes: Conjunctiva pink, no icterus. Mouth: Oropharyngeal mucosa moist and pink  Neck: Supple without thyromegaly, masses, or lymphadenopathy.  Lungs: Clear to auscultation bilaterally.  Heart: Regular rate and rhythm, no murmurs rubs or gallops.  Abdomen: Bowel sounds are normal, nontender, nondistended, no hepatosplenomegaly or masses, no abdominal bruits or hernia , no rebound or guarding.   Rectal: Not performed Extremities: No lower extremity edema, clubbing, deformity.  Neuro: Alert and oriented x 4 , grossly normal neurologically.  Skin: Warm and dry, no rash or jaundice.   Psych: Alert and cooperative, normal mood and affect.        Intake/Output from previous day: No intake/output data recorded. Intake/Output this shift: No intake/output data recorded.  Lab Results:   CBC Recent Labs    03/22/23 1945 03/23/23 0536  WBC 4.4 5.2  HGB 10.8* 10.5*  HCT 34.0* 33.1*  MCV 86.7 86.2  PLT 190 171   BMET Recent Labs    03/22/23 1945 03/23/23 0536  NA 141 142  K 3.8 3.3*  CL 100 105  CO2 31 31  GLUCOSE 95 93  BUN 28* 22  CREATININE 1.84* 1.38*  CALCIUM 8.9 8.7*   LFT Recent Labs    03/22/23 1945 03/23/23 0536  BILITOT 0.4 0.6  ALKPHOS 45 43  AST 13* 11*  ALT 9 9  PROT 6.6 6.0*   ALBUMIN 2.4* 2.1*    Lipase No results for input(s): "LIPASE" in the last 72 hours.  PT/INR Recent Labs    03/22/23 1945 03/23/23 0536  LABPROT 66.9* 54.7*  INR 8.0* 6.1*     Hepatitis Panel No results for input(s): "HEPBSAG", "HCVAB", "HEPAIGM", "HEPBIGM" in the last 72 hours.   Imaging Studies:   DG Chest 2 View Result Date: 02/22/2023 CLINICAL DATA:  Fall and weakness. EXAM: CHEST - 2 VIEW COMPARISON:  Chest radiograph dated 10/22/2022. FINDINGS: Stable cardiomegaly with widening of the vascular pedicle. No evidence of edema, focal consolidation, pleural effusion, or pneumothorax. No acute osseous abnormality. IMPRESSION: 1. No acute findings in the chest. 2. Stable cardiomegaly. Electronically Signed   By: Hart Robinsons M.D.   On: 02/22/2023 19:00   CT Head Wo Contrast Result Date: 02/22/2023 CLINICAL DATA:  Head trauma, minor (Age >= 65y); Neck trauma (Age >= 65y). Fall with weakness. EXAM: CT HEAD WITHOUT CONTRAST CT CERVICAL SPINE WITHOUT CONTRAST TECHNIQUE: Multidetector CT imaging of the head and cervical spine was performed following the standard protocol without intravenous contrast. Multiplanar CT image reconstructions of the cervical spine were also generated. RADIATION  DOSE REDUCTION: This exam was performed according to the departmental dose-optimization program which includes automated exposure control, adjustment of the mA and/or kV according to patient size and/or use of iterative reconstruction technique. COMPARISON:  Head CT 10/22/2022. FINDINGS: CT HEAD FINDINGS Brain: No acute hemorrhage. Stable background of moderate chronic small-vessel disease with old infarcts in the medial aspect of the left occipital lobe and left thalamus. Cortical gray-white differentiation is otherwise preserved. Prominence of the ventricles and sulci within expected range for age. No hydrocephalus or extra-axial collection. No mass effect or midline shift. Vascular: No hyperdense vessel  or unexpected calcification. Skull: No calvarial fracture or suspicious bone lesion. Skull base is unremarkable. 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 Orbits are unremarkable. Other: None. CT CERVICAL SPINE FINDINGS Alignment: Normal. Skull base and vertebrae: No acute fracture. Normal craniocervical junction. No suspicious bone lesions. Acquired fusion at C5-6. Soft tissues and spinal canal: No prevertebral fluid or swelling. No visible canal hematoma. Disc levels: Mild cervical spondylosis without high-grade spinal canal stenosis. Upper chest: No acute findings. Other: None. IMPRESSION: 1. No acute intracranial abnormality. Stable background of moderate chronic small-vessel disease and old infarcts in the left occipital lobe and left thalamus. 2. No acute cervical spine fracture or traumatic listhesis. Electronically Signed   By: Orvan Falconer M.D.   On: 02/22/2023 18:58   CT Cervical Spine Wo Contrast Result Date: 02/22/2023 CLINICAL DATA:  Head trauma, minor (Age >= 65y); Neck trauma (Age >= 65y). Fall with weakness. EXAM: CT HEAD WITHOUT CONTRAST CT CERVICAL SPINE WITHOUT CONTRAST TECHNIQUE: Multidetector CT imaging of the head and cervical spine was performed following the standard protocol without intravenous contrast. Multiplanar CT image reconstructions of the cervical spine were also generated. RADIATION DOSE REDUCTION: This exam was performed according to the departmental dose-optimization program which includes automated exposure control, adjustment of the mA and/or kV according to patient size and/or use of iterative reconstruction technique. COMPARISON:  Head CT 10/22/2022. FINDINGS: CT HEAD FINDINGS Brain: No acute hemorrhage. Stable background of moderate chronic small-vessel disease with old infarcts in the medial aspect of the left occipital lobe and left thalamus. Cortical gray-white differentiation is  otherwise preserved. Prominence of the ventricles and sulci within expected range for age. No hydrocephalus or extra-axial collection. No mass effect or midline shift. Vascular: No hyperdense vessel or unexpected calcification. Skull: No calvarial fracture or suspicious bone lesion. Skull base is unremarkable. 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 Orbits are unremarkable. Other: None. CT CERVICAL SPINE FINDINGS Alignment: Normal. Skull base and vertebrae: No acute fracture. Normal craniocervical junction. No suspicious bone lesions. Acquired fusion at C5-6. Soft tissues and spinal canal: No prevertebral fluid or swelling. No visible canal hematoma. Disc levels: Mild cervical spondylosis without high-grade spinal canal stenosis. Upper chest: No acute findings. Other: None. IMPRESSION: 1. No acute intracranial abnormality. Stable background of moderate chronic small-vessel disease and old infarcts in the left occipital lobe and left thalamus. 2. No acute cervical spine fracture or traumatic listhesis. Electronically Signed   By: Orvan Falconer M.D.   On: 02/22/2023 18:58  [4 week]  Assessment:   Pleasant 68 year old male with past medical history significant for CHF, CKD, hypertension, hyperlipidemia, seizures, A-fib on Coumadin, dementia, prior polysubstance abuse, left occipital/left thalamus CVA who presented to the emergency department yesterday from his group home due to 2-week history of bright red blood in the stools.  Complained  of generalized weakness.  GI consulted for GI bleed.  Rectal bleeding: -In the setting of supratherapeutic INR of 8 on admission -Intermittent rectal bleeding for 2 weeks drop in hemoglobin from 13.3-->10.8-->10.5 -Suspect lower GI bleed, BUN initially slighlty elevated in setting of increased creatinine. No reported melena. Cannot rule out ugi source. Stool was hemoccult positive.   -No bowel movement since admission -No prior colonoscopy  Supratherapeutic INR: -INR 8-->6.1 (after oral Vit K 2.5mg ), given Vit K 5mg  IVPB at 8am 03/23/2023 -will need INR <1.8 before endoscopic evaluation  Plan:   Clear liquid diet. Continue Pantoprazole IV every 12 hours Colonoscopy+/-EGD when INR <1.8.   LOS: 1 day   We would like to thank you for the opportunity to participate in the care of Nathan Shepherd.  Leanna Battles. Dixon Boos [facility] [phone] 3/20/20258:17 AM  Addendum: spoke with legal guardian Windle Guard) who is agreeable to colonoscopy with possible esophagogastroduodenoscopy. Verbal consent to be obtained, order placed and available contact phone numbers provided including after hours/weekend hotline number of [phone].  Leanna Battles. Dixon Boos [facility] [phone] 3/20/20252:32 PM

## 2023-03-23 NOTE — ED Notes (Signed)
 Date and time results received: 03/23/23 0713 (use smartphrase ".now" to insert current time)  Test: inr Critical Value: 6.2  Name of Provider Notified: [place]  Orders Received? Or Actions Taken?: Orders Received - See Orders for details

## 2023-03-23 NOTE — TOC Initial Note (Signed)
 Transition of Care [facility]) - Initial/Assessment Note    Patient Details  Name: Nathan Shepherd MRN: [id number] Date of Birth: [birth year]  [facility] of [facility] CM/SW Contact:    Leitha Bleak, RN Phone Number: 03/23/2023, 2:54 PM  Clinical Narrative:                 Patient is from [facility]. CM spoke with Barbie Banner the administrator. DC planning for 1- 2 days. Onalee Hua will need a new FL2 and will pick up patient when he is medically ready for discharge.  Expected Discharge Plan: Group Home Barriers to Discharge: Continued Medical Work up  Patient Goals and CMS Choice Patient states their goals for this hospitalization and ongoing recovery are:: return to Group home CMS Medicare.gov Compare Post Acute Care list provided to:: Patient Represenative (must comment) Choice offered to / list presented to : [facility] POA / Guardian    Expected Discharge Plan and Services      Living arrangements for the past 2 months: Group Home                     Prior Living Arrangements/Services Living arrangements for the past 2 months: Group Home Lives with:: Facility Resident           Activities of Daily Living   ADL Screening (condition at time of admission) Independently performs ADLs?: Yes (appropriate for developmental age) Is the patient deaf or have difficulty hearing?: No Does the patient have difficulty seeing, even when wearing glasses/contacts?: No Does the patient have difficulty concentrating, remembering, or making decisions?: No  Permission Sought/Granted     Emotional Assessment      Orientation: : Oriented to Self, Oriented to Place, Oriented to  Time, Oriented to Situation Alcohol / Substance Use: Not Applicable Psych Involvement: No (comment)  Admission diagnosis:  Rectal bleeding [K62.5] GI bleed [K92.2] Elevated INR [R79.1] Anemia, unspecified type [D64.9] Patient Active Problem List   Diagnosis Date Noted   Supratherapeutic INR  03/23/2023   Hypoalbuminemia due to protein-calorie malnutrition (HCC) 03/23/2023   Essential hypertension 03/23/2023   Mixed hyperlipidemia 03/23/2023   History of CVA (cerebrovascular accident) 03/23/2023   GI bleed 03/22/2023   Seizure (HCC) 10/22/2022   Status epilepticus (HCC) 05/13/2022   Acute respiratory failure with hypoxia (HCC) 05/13/2022   Paroxysmal atrial fibrillation (HCC) 05/13/2022   Bradycardia 05/13/2022   Breakthrough seizure (HCC) 05/12/2022   Hypokalemia 08/25/2021   Acute respiratory failure with hypoxia (HCC) 08/25/2021   Atrial fibrillation, chronic (HCC) 08/25/2021   Aspiration pneumonia (HCC) 08/25/2021   Acute kidney injury superimposed on chronic kidney disease (HCC) 08/25/2021   Chronic kidney disease, stage III (moderate) (HCC) 08/25/2021   Hypophosphatemia 08/25/2021   Hypomagnesemia 08/25/2021   Status epilepticus (HCC) 08/21/2021   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 [place] [address] Phone: [phone] Fax: [phone]  [facility] [address] [place] [address] Phone: [phone] Fax: [phone]    Social Drivers of Health (SDOH) Social History: SDOH Screenings   Food Insecurity: No Food Insecurity (03/23/2023)  Housing: Low Risk  (03/23/2023)  Transportation Needs: No Transportation Needs (03/23/2023)  Utilities: Not At Risk (03/23/2023)  Social Connections: Socially Isolated (03/23/2023)  Tobacco Use: Unknown (03/22/2023)  SDOH Interventions:    Readmission Risk Interventions     No data to display

## 2023-03-23 NOTE — H&P (View-Only) (Signed)
 Gastroenterology Consult   Referring Provider: No ref. provider found Primary Care Physician:  Rush Farmer, NP Primary Gastroenterologist:  previously unassigned (Dr. Tasia Catchings)  Patient ID: Nathan Shepherd; [id number]; February 01, 1955   Admit date: 03/22/2023  LOS: 1 day   Date of Consultation: 03/23/2023  Reason for Consultation:  GI bleed    History of Present Illness   Nathan Shepherd is a 68 y.o. male with past medical history significant for CHF, CKD, hypertension, hyperlipidemia, seizures, A-fib on Coumadin, dementia, prior polysubstance abuse, left occipital CVA who presented to the emergency department yesterday from his group home due to 2-week history of bright red blood in the stools.  Complained of generalized weakness.  In the ED: Blood pressure 150/110, pulse 81.  Hemoglobin 10.8 have been 13.3 one month prior.  Platelets 190,000.  BUN 28, creatinine 1.84 up from 1.4 one month prior. Stool was Hemoccult positive.  INR was 8.0, 1 month prior was 3.4.  Vitamin K 2.5 mg p.o. provided.  Today's INR is down to 6.1.  Potassium 3.3, BUN 22, creatinine 1.38, hemoglobin 10.5, magnesium 1.7.  Patient started on pantoprazole 40 mg IV every 12 hours.  He was given an additional dose of vitamin K, 5 mg IV at 8 AM this morning.  GI consult: Patient has a legal guardian, listed in the chart.  He is alert and oriented to person, place, time.  He states he has been seeing blood in his stools off and on for 2 weeks.  Last bowel movement yesterday.  Does not see it every time.  No melena.  Denies constipation or diarrhea.  Denies abdominal pain.  Stated his appetite has been good, he is hungry right now.  He would like to have some to eat.  Denies any reflux.  No dysphagia.  Denies blood in the urine.  Denies nosebleeds.  Denies history of colonoscopy or upper endoscopy.   Prior to Admission medications   Medication Sig Start Date End Date Taking? Authorizing Provider  warfarin  (COUMADIN) 7.5 MG tablet Take 7.5 mg by mouth daily. 02/23/23  Yes [provider]  acetaminophen (TYLENOL) 325 MG tablet Take 650 mg by mouth every 4 (four) hours as needed for mild pain (pain score 1-3).    [provider]  amLODipine (NORVASC) 2.5 MG tablet Take 1 tablet (2.5 mg total) by mouth daily. 08/28/21   Marinda Elk, MD  aspirin EC 81 MG tablet Take 81 mg by mouth daily.    [provider]  Calcium Carbonate Antacid (ANTACID PO) Take 30 mLs by mouth 4 (four) times daily as needed (heartburn, indigestion). Antacid Suspension    [provider]  carvedilol (COREG) 12.5 MG tablet Take 12.5 mg by mouth 2 (two) times daily with a meal.    [provider]  diphenhydrAMINE (BENADRYL) 25 MG tablet Take 25 mg by mouth every 6 (six) hours as needed for allergies or itching.    [provider]  divalproex (DEPAKOTE) 250 MG DR tablet Take 3 tablets (750 mg total) by mouth every 12 (twelve) hours. 10/24/22 11/23/22  Tresa Moore, MD  docusate sodium (COLACE) 100 MG capsule Take 100 mg by mouth at bedtime.    [provider]  donepezil (ARICEPT) 5 MG tablet Take 5 mg by mouth at bedtime.    [provider]  Emollient (EUCERIN) lotion Apply 1 Application topically daily. (Apply to legs and feet)    [provider]  fluocinolone 0.01 %  cream Apply 1 application  topically 3 (three) times daily as needed (bug bites/rash).    [provider]  guaiFENesin (ROBITUSSIN) 100 MG/5ML liquid Take 10 mLs by mouth every 6 (six) hours as needed for cough or to loosen phlegm.    [provider]  levETIRAcetam (KEPPRA) 1000 MG tablet Take 1,000 mg by mouth 2 (two) times daily.    [provider]  loperamide (IMODIUM) 2 MG capsule Take 1 capsule (2 mg total) by mouth as needed for diarrhea or loose stools. Patient taking differently: Take 2 mg by mouth as needed for diarrhea or loose stools. (Max 8  doses in 24 hours) 05/17/22   Lonia Blood, MD  losartan (COZAAR) 50 MG tablet Take 50 mg by mouth daily.    [provider]  magnesium hydroxide (MILK OF MAGNESIA) 400 MG/5ML suspension Take 30 mLs by mouth at bedtime as needed for mild constipation.    [provider]  NIFEdipine (PROCARDIA XL/NIFEDICAL XL) 60 MG 24 hr tablet Take 60 mg by mouth daily.    [provider]  rosuvastatin (CRESTOR) 20 MG tablet Take 20 mg by mouth at bedtime.     [provider]  SYMBICORT 160-4.5 MCG/ACT inhaler Inhale 2 puffs into the lungs. 02/23/23   [provider]  torsemide (DEMADEX) 20 MG tablet Take 20 mg by mouth daily.    [provider]  Vitamin D, Ergocalciferol, (DRISDOL) 1.25 MG (50000 UNIT) CAPS capsule Take 50,000 Units by mouth every Monday.    [provider]  warfarin (COUMADIN) 6 MG tablet Take 1 tablet (6 mg total) by mouth every evening. 10/24/22   Tresa Moore, MD    Current Facility-Administered Medications  Medication Dose Route Frequency Provider Last Rate Last Admin   feeding supplement (ENSURE ENLIVE / ENSURE PLUS) liquid 237 mL  237 mL Oral BID BM Shepherd, Oladapo, DO       pantoprazole (PROTONIX) injection 40 mg  40 mg Intravenous Q12H Shepherd, Oladapo, DO       phytonadione (VITAMIN K) 5 mg in dextrose 5 % 50 mL IVPB  5 mg Intravenous Once Azucena Fallen, MD       sodium chloride 0.9 % bolus 500 mL  500 mL Intravenous Once Bethann Berkshire, MD       Current Outpatient Medications  Medication Sig Dispense Refill   warfarin (COUMADIN) 7.5 MG tablet Take 7.5 mg by mouth daily.     acetaminophen (TYLENOL) 325 MG tablet Take 650 mg by mouth every 4 (four) hours as needed for mild pain (pain score 1-3).     amLODipine (NORVASC) 2.5 MG tablet Take 1 tablet (2.5 mg total) by mouth daily. 30 tablet 0   aspirin EC 81 MG tablet Take 81 mg by mouth daily.     Calcium Carbonate Antacid (ANTACID PO) Take 30 mLs by  mouth 4 (four) times daily as needed (heartburn, indigestion). Antacid Suspension     carvedilol (COREG) 12.5 MG tablet Take 12.5 mg by mouth 2 (two) times daily with a meal.     diphenhydrAMINE (BENADRYL) 25 MG tablet Take 25 mg by mouth every 6 (six) hours as needed for allergies or itching.     divalproex (DEPAKOTE) 250 MG DR tablet Take 3 tablets (750 mg total) by mouth every 12 (twelve) hours. 180 tablet 0   docusate sodium (COLACE) 100 MG capsule Take 100 mg by mouth at bedtime.     donepezil (ARICEPT) 5 MG tablet Take  5 mg by mouth at bedtime.     Emollient (EUCERIN) lotion Apply 1 Application topically daily. (Apply to legs and feet)     fluocinolone 0.01 % cream Apply 1 application  topically 3 (three) times daily as needed (bug bites/rash).     guaiFENesin (ROBITUSSIN) 100 MG/5ML liquid Take 10 mLs by mouth every 6 (six) hours as needed for cough or to loosen phlegm.     levETIRAcetam (KEPPRA) 1000 MG tablet Take 1,000 mg by mouth 2 (two) times daily.     loperamide (IMODIUM) 2 MG capsule Take 1 capsule (2 mg total) by mouth as needed for diarrhea or loose stools. (Patient taking differently: Take 2 mg by mouth as needed for diarrhea or loose stools. (Max 8 doses in 24 hours)) 30 capsule 0   losartan (COZAAR) 50 MG tablet Take 50 mg by mouth daily.     magnesium hydroxide (MILK OF MAGNESIA) 400 MG/5ML suspension Take 30 mLs by mouth at bedtime as needed for mild constipation.     NIFEdipine (PROCARDIA XL/NIFEDICAL XL) 60 MG 24 hr tablet Take 60 mg by mouth daily.     rosuvastatin (CRESTOR) 20 MG tablet Take 20 mg by mouth at bedtime.      SYMBICORT 160-4.5 MCG/ACT inhaler Inhale 2 puffs into the lungs.     torsemide (DEMADEX) 20 MG tablet Take 20 mg by mouth daily.     Vitamin D, Ergocalciferol, (DRISDOL) 1.25 MG (50000 UNIT) CAPS capsule Take 50,000 Units by mouth every Monday.     warfarin (COUMADIN) 6 MG tablet Take 1 tablet (6 mg total) by mouth every evening.      Allergies as of  03/22/2023 - Reviewed 03/22/2023  Allergen Reaction Noted   Penicillins Hives 11/20/2015    Past Medical History:  Diagnosis Date   CHF (congestive heart failure) (HCC)    CKD (chronic kidney disease) stage 3, GFR 30-59 ml/min (HCC)    Edema    Hyperlipemia    Hypertension    Seizures (HCC)     Past Surgical History:  Procedure Laterality Date   NO PAST SURGERIES      Family History  Problem Relation Age of Onset   Colon cancer Neg Hx    Liver disease Neg Hx     Social History   Socioeconomic History   Marital status: Single    Spouse name: Not on file   Number of children: Not on file   Years of education: Not on file   Highest education level: Not on file  Occupational History   Not on file  Tobacco Use   Smoking status: Unknown   Smokeless tobacco: Never  Substance and Sexual Activity   Alcohol use: Not Currently   Drug use: Never   Sexual activity: Not Currently  Other Topics Concern   Not on file  Social History Narrative   ** Merged History Encounter **       Social Drivers of Health   Financial Resource Strain: Not on file  Food Insecurity: Not on file  Transportation Needs: Not on file  Physical Activity: Not on file  Stress: Not on file  Social Connections: Not on file  Intimate Partner Violence: Not on file     Review of System:   General: Negative for anorexia, weight loss, fever, chills, fatigue, weakness. Eyes: Negative for vision changes.  ENT: Negative for hoarseness, difficulty swallowing , nasal congestion. CV: Negative for chest pain, angina, palpitations, dyspnea on exertion, peripheral edema.  Respiratory:  Negative for dyspnea at rest, dyspnea on exertion, cough, sputum, wheezing.  GI: See history of present illness. GU:  Negative for dysuria, hematuria, urinary incontinence, urinary frequency, nocturnal urination.  MS: Negative for joint pain, low back pain.  Derm: Negative for rash or itching.  Neuro: Negative for weakness,  abnormal sensation, seizure, frequent headaches, memory loss, confusion.  Psych: Negative for anxiety, depression, suicidal ideation, hallucinations.  Endo: Negative for unusual weight change.  Heme: Negative for bruising or bleeding. Allergy: Negative for rash or hives.      Physical Examination:   Vital signs in last 24 hours: Temp:  [98.2 F (36.8 C)-98.9 F (37.2 C)] 98.2 F (36.8 C) (03/20 0721) Pulse Rate:  [57-83] 73 (03/20 0800) Resp:  [17-18] 17 (03/20 0721) BP: (129-171)/(87-110) 141/92 (03/20 0800) SpO2:  [92 %-96 %] 95 % (03/20 0800) Last BM Date : 03/22/23  General: Well-nourished, well-developed in no acute distress.  Head: Normocephalic, atraumatic.   Eyes: Conjunctiva pink, no icterus. Mouth: Oropharyngeal mucosa moist and pink  Neck: Supple without thyromegaly, masses, or lymphadenopathy.  Lungs: Clear to auscultation bilaterally.  Heart: Regular rate and rhythm, no murmurs rubs or gallops.  Abdomen: Bowel sounds are normal, nontender, nondistended, no hepatosplenomegaly or masses, no abdominal bruits or hernia , no rebound or guarding.   Rectal: Not performed Extremities: No lower extremity edema, clubbing, deformity.  Neuro: Alert and oriented x 4 , grossly normal neurologically.  Skin: Warm and dry, no rash or jaundice.   Psych: Alert and cooperative, normal mood and affect.        Intake/Output from previous day: No intake/output data recorded. Intake/Output this shift: No intake/output data recorded.  Lab Results:   CBC Recent Labs    03/22/23 1945 03/23/23 0536  WBC 4.4 5.2  HGB 10.8* 10.5*  HCT 34.0* 33.1*  MCV 86.7 86.2  PLT 190 171   BMET Recent Labs    03/22/23 1945 03/23/23 0536  NA 141 142  K 3.8 3.3*  CL 100 105  CO2 31 31  GLUCOSE 95 93  BUN 28* 22  CREATININE 1.84* 1.38*  CALCIUM 8.9 8.7*   LFT Recent Labs    03/22/23 1945 03/23/23 0536  BILITOT 0.4 0.6  ALKPHOS 45 43  AST 13* 11*  ALT 9 9  PROT 6.6 6.0*   ALBUMIN 2.4* 2.1*    Lipase No results for input(s): "LIPASE" in the last 72 hours.  PT/INR Recent Labs    03/22/23 1945 03/23/23 0536  LABPROT 66.9* 54.7*  INR 8.0* 6.1*     Hepatitis Panel No results for input(s): "HEPBSAG", "HCVAB", "HEPAIGM", "HEPBIGM" in the last 72 hours.   Imaging Studies:   DG Chest 2 View Result Date: 02/22/2023 CLINICAL DATA:  Fall and weakness. EXAM: CHEST - 2 VIEW COMPARISON:  Chest radiograph dated 10/22/2022. FINDINGS: Stable cardiomegaly with widening of the vascular pedicle. No evidence of edema, focal consolidation, pleural effusion, or pneumothorax. No acute osseous abnormality. IMPRESSION: 1. No acute findings in the chest. 2. Stable cardiomegaly. Electronically Signed   By: Hart Robinsons M.D.   On: 02/22/2023 19:00   CT Head Wo Contrast Result Date: 02/22/2023 CLINICAL DATA:  Head trauma, minor (Age >= 65y); Neck trauma (Age >= 65y). Fall with weakness. EXAM: CT HEAD WITHOUT CONTRAST CT CERVICAL SPINE WITHOUT CONTRAST TECHNIQUE: Multidetector CT imaging of the head and cervical spine was performed following the standard protocol without intravenous contrast. Multiplanar CT image reconstructions of the cervical spine were also generated. RADIATION  DOSE REDUCTION: This exam was performed according to the departmental dose-optimization program which includes automated exposure control, adjustment of the mA and/or kV according to patient size and/or use of iterative reconstruction technique. COMPARISON:  Head CT 10/22/2022. FINDINGS: CT HEAD FINDINGS Brain: No acute hemorrhage. Stable background of moderate chronic small-vessel disease with old infarcts in the medial aspect of the left occipital lobe and left thalamus. Cortical gray-white differentiation is otherwise preserved. Prominence of the ventricles and sulci within expected range for age. No hydrocephalus or extra-axial collection. No mass effect or midline shift. Vascular: No hyperdense vessel  or unexpected calcification. Skull: No calvarial fracture or suspicious bone lesion. Skull base is unremarkable. 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 Orbits are unremarkable. Other: None. CT CERVICAL SPINE FINDINGS Alignment: Normal. Skull base and vertebrae: No acute fracture. Normal craniocervical junction. No suspicious bone lesions. Acquired fusion at C5-6. Soft tissues and spinal canal: No prevertebral fluid or swelling. No visible canal hematoma. Disc levels: Mild cervical spondylosis without high-grade spinal canal stenosis. Upper chest: No acute findings. Other: None. IMPRESSION: 1. No acute intracranial abnormality. Stable background of moderate chronic small-vessel disease and old infarcts in the left occipital lobe and left thalamus. 2. No acute cervical spine fracture or traumatic listhesis. Electronically Signed   By: Orvan Falconer M.D.   On: 02/22/2023 18:58   CT Cervical Spine Wo Contrast Result Date: 02/22/2023 CLINICAL DATA:  Head trauma, minor (Age >= 65y); Neck trauma (Age >= 65y). Fall with weakness. EXAM: CT HEAD WITHOUT CONTRAST CT CERVICAL SPINE WITHOUT CONTRAST TECHNIQUE: Multidetector CT imaging of the head and cervical spine was performed following the standard protocol without intravenous contrast. Multiplanar CT image reconstructions of the cervical spine were also generated. RADIATION DOSE REDUCTION: This exam was performed according to the departmental dose-optimization program which includes automated exposure control, adjustment of the mA and/or kV according to patient size and/or use of iterative reconstruction technique. COMPARISON:  Head CT 10/22/2022. FINDINGS: CT HEAD FINDINGS Brain: No acute hemorrhage. Stable background of moderate chronic small-vessel disease with old infarcts in the medial aspect of the left occipital lobe and left thalamus. Cortical gray-white differentiation is  otherwise preserved. Prominence of the ventricles and sulci within expected range for age. No hydrocephalus or extra-axial collection. No mass effect or midline shift. Vascular: No hyperdense vessel or unexpected calcification. Skull: No calvarial fracture or suspicious bone lesion. Skull base is unremarkable. 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 Orbits are unremarkable. Other: None. CT CERVICAL SPINE FINDINGS Alignment: Normal. Skull base and vertebrae: No acute fracture. Normal craniocervical junction. No suspicious bone lesions. Acquired fusion at C5-6. Soft tissues and spinal canal: No prevertebral fluid or swelling. No visible canal hematoma. Disc levels: Mild cervical spondylosis without high-grade spinal canal stenosis. Upper chest: No acute findings. Other: None. IMPRESSION: 1. No acute intracranial abnormality. Stable background of moderate chronic small-vessel disease and old infarcts in the left occipital lobe and left thalamus. 2. No acute cervical spine fracture or traumatic listhesis. Electronically Signed   By: Orvan Falconer M.D.   On: 02/22/2023 18:58  [4 week]  Assessment:   Pleasant 68 year old male with past medical history significant for CHF, CKD, hypertension, hyperlipidemia, seizures, A-fib on Coumadin, dementia, prior polysubstance abuse, left occipital/left thalamus CVA who presented to the emergency department yesterday from his group home due to 2-week history of bright red blood in the stools.  Complained  of generalized weakness.  GI consulted for GI bleed.  Rectal bleeding: -In the setting of supratherapeutic INR of 8 on admission -Intermittent rectal bleeding for 2 weeks drop in hemoglobin from 13.3-->10.8-->10.5 -Suspect lower GI bleed, BUN initially slighlty elevated in setting of increased creatinine. No reported melena. Cannot rule out ugi source. Stool was hemoccult positive.   -No bowel movement since admission -No prior colonoscopy  Supratherapeutic INR: -INR 8-->6.1 (after oral Vit K 2.5mg ), given Vit K 5mg  IVPB at 8am 03/23/2023 -will need INR <1.8 before endoscopic evaluation  Plan:   Clear liquid diet. Continue Pantoprazole IV every 12 hours Colonoscopy+/-EGD when INR <1.8.   LOS: 1 day   We would like to thank you for the opportunity to participate in the care of Ozie Lupe.  Leanna Battles. Dixon Boos [facility] [phone] 3/20/20258:17 AM  Addendum: spoke with legal guardian Windle Guard) who is agreeable to colonoscopy with possible esophagogastroduodenoscopy. Verbal consent to be obtained, order placed and available contact phone numbers provided including after hours/weekend hotline number of [phone].  Leanna Battles. Dixon Boos [facility] [phone] 3/20/20252:32 PM

## 2023-03-23 NOTE — ED Notes (Addendum)
 Just received hydralazine and given. Pt sat on side of bed for approx 

## 2023-03-23 NOTE — ED Notes (Signed)
 DR Natale Milch aware of bp. Awaiting pharmacy to bring med

## 2023-03-23 NOTE — ED Notes (Signed)
 Facility, [place] updated.

## 2023-03-24 ENCOUNTER — Encounter (HOSPITAL_COMMUNITY): Payer: Self-pay | Admitting: Internal Medicine

## 2023-03-24 ENCOUNTER — Inpatient Hospital Stay (HOSPITAL_COMMUNITY): Admitting: Anesthesiology

## 2023-03-24 ENCOUNTER — Encounter (HOSPITAL_COMMUNITY)
Admission: EM | Disposition: A | Discharge status: Other Acute Inpatient Hospital | Payer: Self-pay | Source: Home / Self Care | Attending: Internal Medicine

## 2023-03-24 DIAGNOSIS — K620 Anal polyp: Secondary | ICD-10-CM

## 2023-03-24 DIAGNOSIS — I509 Heart failure, unspecified: Secondary | ICD-10-CM | POA: Diagnosis not present

## 2023-03-24 DIAGNOSIS — K648 Other hemorrhoids: Secondary | ICD-10-CM | POA: Diagnosis not present

## 2023-03-24 DIAGNOSIS — K31A19 Gastric intestinal metaplasia without dysplasia, unspecified site: Secondary | ICD-10-CM | POA: Diagnosis not present

## 2023-03-24 DIAGNOSIS — K626 Ulcer of anus and rectum: Secondary | ICD-10-CM | POA: Diagnosis not present

## 2023-03-24 DIAGNOSIS — K623 Rectal prolapse: Secondary | ICD-10-CM

## 2023-03-24 DIAGNOSIS — K2950 Unspecified chronic gastritis without bleeding: Secondary | ICD-10-CM | POA: Diagnosis not present

## 2023-03-24 DIAGNOSIS — K2970 Gastritis, unspecified, without bleeding: Secondary | ICD-10-CM | POA: Diagnosis not present

## 2023-03-24 DIAGNOSIS — R791 Abnormal coagulation profile: Secondary | ICD-10-CM | POA: Diagnosis not present

## 2023-03-24 DIAGNOSIS — I130 Hypertensive heart and chronic kidney disease with heart failure and stage 1 through stage 4 chronic kidney disease, or unspecified chronic kidney disease: Secondary | ICD-10-CM

## 2023-03-24 DIAGNOSIS — D649 Anemia, unspecified: Secondary | ICD-10-CM

## 2023-03-24 DIAGNOSIS — K921 Melena: Secondary | ICD-10-CM | POA: Diagnosis not present

## 2023-03-24 DIAGNOSIS — I482 Chronic atrial fibrillation, unspecified: Secondary | ICD-10-CM | POA: Diagnosis not present

## 2023-03-24 DIAGNOSIS — N179 Acute kidney failure, unspecified: Secondary | ICD-10-CM | POA: Diagnosis not present

## 2023-03-24 HISTORY — PX: ESOPHAGOGASTRODUODENOSCOPY: SHX5428

## 2023-03-24 HISTORY — PX: COLONOSCOPY: SHX5424

## 2023-03-24 LAB — CBC
HCT: 36.3 % — ABNORMAL LOW (ref 39.0–52.0)
Hemoglobin: 11.2 g/dL — ABNORMAL LOW (ref 13.0–17.0)
MCH: 26.9 pg (ref 26.0–34.0)
MCHC: 30.9 g/dL (ref 30.0–36.0)
MCV: 87.3 fL (ref 80.0–100.0)
Platelets: 181 10*3/uL (ref 150–400)
RBC: 4.16 MIL/uL — ABNORMAL LOW (ref 4.22–5.81)
RDW: 14.8 % (ref 11.5–15.5)
WBC: 4.2 10*3/uL (ref 4.0–10.5)
nRBC: 0 % (ref 0.0–0.2)

## 2023-03-24 LAB — PROTIME-INR
INR: 1.5 — ABNORMAL HIGH (ref 0.8–1.2)
Prothrombin Time: 18.4 s — ABNORMAL HIGH (ref 11.4–15.2)

## 2023-03-24 LAB — HEPARIN LEVEL (UNFRACTIONATED): Heparin Unfractionated: 0.31 [IU]/mL (ref 0.30–0.70)

## 2023-03-24 SURGERY — COLONOSCOPY
Anesthesia: General

## 2023-03-24 MED ORDER — SODIUM CHLORIDE 0.9% FLUSH: 3.0000 mL | INTRAVENOUS | Status: DC | PRN

## 2023-03-24 MED ORDER — PROPOFOL 10 MG/ML IV BOLUS
INTRAVENOUS | Status: DC | PRN
  Administered 2023-03-24: 60 mg via INTRAVENOUS

## 2023-03-24 MED ORDER — PROPOFOL 500 MG/50ML IV EMUL
INTRAVENOUS | Status: DC | PRN
  Administered 2023-03-24: 150 ug/kg/min via INTRAVENOUS

## 2023-03-24 MED ORDER — LACTATED RINGERS IV SOLN: INTRAVENOUS | Status: DC | PRN

## 2023-03-24 MED ORDER — SODIUM CHLORIDE 0.9% FLUSH
3.0000 mL | Freq: Two times a day (BID) | INTRAVENOUS | Status: DC
Start: 2023-03-24 — End: 2023-03-24

## 2023-03-24 MED ORDER — PANTOPRAZOLE SODIUM 40 MG PO TBEC
40.0000 mg | DELAYED_RELEASE_TABLET | Freq: Two times a day (BID) | ORAL | Status: DC
  Administered 2023-03-24 – 2023-03-25 (×2): 40 mg via ORAL
  Filled 2023-03-24 (×2): qty 1

## 2023-03-24 MED ORDER — HEPARIN (PORCINE) 25000 UT/250ML-% IV SOLN
1200.0000 [IU]/h | INTRAVENOUS | Status: DC
Start: 2023-03-24 — End: 2023-03-25
  Administered 2023-03-24 – 2023-03-25 (×2): 1200 [IU]/h via INTRAVENOUS
  Filled 2023-03-24 (×2): qty 250

## 2023-03-24 MED ORDER — HYDRALAZINE HCL 50 MG PO TABS
50.0000 mg | ORAL_TABLET | Freq: Three times a day (TID) | ORAL | Status: DC
  Administered 2023-03-24 – 2023-03-25 (×4): 50 mg via ORAL
  Filled 2023-03-24 (×5): qty 1

## 2023-03-24 MED ORDER — BISACODYL 5 MG PO TBEC
10.0000 mg | DELAYED_RELEASE_TABLET | Freq: Once | ORAL | Status: AC
  Administered 2023-03-24: 10 mg via ORAL
  Filled 2023-03-24: qty 2

## 2023-03-24 MED ORDER — HEPARIN BOLUS VIA INFUSION
4000.0000 [IU] | Freq: Once | INTRAVENOUS | Status: AC
  Administered 2023-03-24: 4000 [IU] via INTRAVENOUS
  Filled 2023-03-24: qty 4000

## 2023-03-24 MED ORDER — LIDOCAINE HCL (CARDIAC) PF 100 MG/5ML IV SOSY
PREFILLED_SYRINGE | INTRAVENOUS | Status: DC | PRN
  Administered 2023-03-24: 50 mg via INTRATRACHEAL

## 2023-03-24 MED ORDER — DIVALPROEX SODIUM 250 MG PO DR TAB
750.0000 mg | DELAYED_RELEASE_TABLET | Freq: Two times a day (BID) | ORAL | Status: DC
  Administered 2023-03-24 – 2023-03-25 (×2): 750 mg via ORAL
  Filled 2023-03-24 (×2): qty 3

## 2023-03-24 MED ORDER — SODIUM CHLORIDE 0.9% FLUSH: 3.0000 mL | Freq: Two times a day (BID) | INTRAVENOUS | Status: DC

## 2023-03-24 NOTE — Progress Notes (Signed)
 Patient underwent EGD and Colonoscopy under propofol sedation.  Tolerated the procedure adequately.   FINDINGS:  Upper endoscopy   - Normal esophagus. - Gastritis. Biopsied. - Normal duodenal bulb and second portion of the duodenum.  Colonoscopy :  - The perianal exam findings include Protruding lesion beyond dentate line , soft.  This could be rectal prolapse but biopsy obtained to r/ o malignancy ( see pictures)   - Non- bleeding internal hemorrhoids.  RECOMMENDATIONS  - No absolute GI contraindication to restart clincally indicated anticoagulation while patient is closely monitored   - Patient likely had hematochezia from protruding anal lesion in setting of supratherapuetic INR   - Obtain Surgical evaluation ; if patient has no further overt bleed can be seen as outpatient by surgery otherwise if apparent bleed after starting anticoagulation than obtain inpatient surgical eval   - Resume previous diet.   - Continue present medications.  - Await pathology results.   - Repeat colonoscopy in 10 years for screening purposes.  Please recall GI with any further questions  Vista Lawman, MD Gastroenterology and Hepatology [facility]

## 2023-03-24 NOTE — Transfer of Care (Signed)
 Immediate Anesthesia Transfer of Care Note  [name]  Procedure(s) Performed: COLONOSCOPY EGD (ESOPHAGOGASTRODUODENOSCOPY)  Patient Location: PACU  Anesthesia Type:General  Level of Consciousness: awake  Airway & Oxygen Therapy: Patient Spontanous Breathing  Post-op Assessment: Report given to RN  Post vital signs: Reviewed and stable  Last Vitals:  Vitals Value Taken Time  BP 118/93 03/24/23 1350  Temp    Pulse    Resp 47 03/24/23 1351  SpO2    Vitals shown include unfiled device data.  Last Pain:  Vitals:   03/24/23 1257  TempSrc:   PainSc: 0-No pain      Patients Stated Pain Goal: 0 (03/23/23 1500)  Complications: No notable events documented.

## 2023-03-24 NOTE — Interval H&P Note (Signed)
 History and Physical Interval Note:  03/24/2023 12:04 PM  Nathan Shepherd  has presented today for surgery, with the diagnosis of acute blood loss anemia, rectal bleeding.  The various methods of treatment have been discussed with the patient and family. After consideration of risks, benefits and other options for treatment, the patient has consented to  Procedure(s): COLONOSCOPY (N/A) EGD (ESOPHAGOGASTRODUODENOSCOPY) (N/A) as a surgical intervention.  The patient's history has been reviewed, patient examined, no change in status, stable for surgery.  I have reviewed the patient's chart and labs.  Questions were answered to the patient's satisfaction.     [name] Pascal Stiggers

## 2023-03-24 NOTE — Subjective & Objective (Addendum)
 Pt seen and examined. Had prep for colonoscopy. RN performing tap water enemas.  Reviewed pt's chart. Pt is NOT on DOAC due to concomitant treatment with AEDs(depakote). This was decided during May 2024 admission.

## 2023-03-24 NOTE — TOC Progression Note (Signed)
 Transition of Care [facility]) - Progression Note    Patient Details  Name: Nathan Shepherd MRN: [id number] Date of Birth: 01/28/1955  [facility] of [facility] CM/SW Contact  Isabella Bowens, [place] Phone Number: 03/24/2023, 2:33 PM  Clinical Narrative:     This Clinical research associate has made numerous call attempts throughout the day to [facility]. CSW did leave a confidential voicemail for a return call back this last call. TOC to follow.  Expected Discharge Plan: Group Home Barriers to Discharge: Continued Medical Work up  Expected Discharge Plan and Services       Living arrangements for the past 2 months: Group Home                                       Social Determinants of Health (SDOH) Interventions SDOH Screenings   Food Insecurity: No Food Insecurity (03/23/2023)  Housing: Low Risk  (03/23/2023)  Transportation Needs: No Transportation Needs (03/23/2023)  Utilities: Not At Risk (03/23/2023)  Social Connections: Socially Isolated (03/23/2023)  Tobacco Use: Unknown (03/24/2023)    Readmission Risk Interventions     No data to display

## 2023-03-24 NOTE — Progress Notes (Addendum)
 PHARMACY - ANTICOAGULATION CONSULT NOTE  Pharmacy Consult for Heparin Indication: atrial fibrillation  Allergies  Allergen Reactions   Penicillins Hives    Tolerated Zosyn    Patient Measurements: Height: 6\' 4"  (193 cm) Weight: 87.8 kg (193 lb 8 oz) IBW/kg (Calculated) : 86.8 HEPARIN DW (KG): 87.8   Vital Signs: Temp: 97.6 F (36.4 C) (03/21 1351) Temp Source: Oral (03/21 1208) BP: 175/123 (03/21 1421) Pulse Rate: 79 (03/21 1421)  Labs: Recent Labs    03/22/23 1945 03/23/23 0536 03/23/23 1328 03/24/23 0503  HGB 10.8* 10.5*  --  11.2*  HCT 34.0* 33.1*  --  36.3*  PLT 190 171  --  181  LABPROT 66.9* 54.7* 25.4* 18.4*  INR 8.0* 6.1* 2.3* 1.5*  CREATININE 1.84* 1.38*  --   --     Estimated Creatinine Clearance: 65.5 mL/min (A) (by C-G formula based on SCr of 1.38 mg/dL (H)).   Medical History: Past Medical History:  Diagnosis Date   CHF (congestive heart failure) (HCC)    CKD (chronic kidney disease) stage 3, GFR 30-59 ml/min (HCC)    Edema    Hyperlipemia    Hypertension    Seizures (HCC)     Medications:  Medications Prior to Admission  Medication Sig Dispense Refill Last Dose/Taking   acetaminophen (TYLENOL) 325 MG tablet Take 650 mg by mouth every 4 (four) hours as needed for mild pain (pain score 1-3).   Taking As Needed   amLODipine (NORVASC) 2.5 MG tablet Take 1 tablet (2.5 mg total) by mouth daily. 30 tablet 0 03/22/2023 Morning   aspirin EC 81 MG tablet Take 81 mg by mouth daily.   03/22/2023 Morning   Calcium Carbonate Antacid (ANTACID PO) Take 30 mLs by mouth 4 (four) times daily as needed (heartburn, indigestion). Antacid Suspension   Taking As Needed   carvedilol (COREG) 12.5 MG tablet Take 12.5 mg by mouth 2 (two) times daily with a meal.   03/22/2023 Morning   diphenhydrAMINE (BENADRYL) 25 MG tablet Take 25 mg by mouth every 6 (six) hours as needed for allergies or itching.   Taking As Needed   divalproex (DEPAKOTE) 250 MG DR tablet Take 3 tablets  (750 mg total) by mouth every 12 (twelve) hours. 180 tablet 0 03/22/2023 Morning   docusate sodium (COLACE) 100 MG capsule Take 100 mg by mouth at bedtime.   Past Week   donepezil (ARICEPT) 5 MG tablet Take 5 mg by mouth at bedtime.   03/21/2023   Emollient (EUCERIN) lotion Apply 1 Application topically daily. (Apply to legs and feet)   03/22/2023 Morning   fluocinolone 0.01 % cream Apply 1 application  topically 3 (three) times daily as needed (bug bites/rash).   Taking As Needed   guaiFENesin (ROBITUSSIN) 100 MG/5ML liquid Take 10 mLs by mouth every 6 (six) hours as needed for cough or to loosen phlegm.   Taking As Needed   levETIRAcetam (KEPPRA) 1000 MG tablet Take 1,000 mg by mouth 2 (two) times daily.   03/22/2023 Morning   loperamide (IMODIUM) 2 MG capsule Take 1 capsule (2 mg total) by mouth as needed for diarrhea or loose stools. (Patient taking differently: Take 2 mg by mouth as needed for diarrhea or loose stools. (Max 8 doses in 24 hours)) 30 capsule 0 Taking Differently   losartan (COZAAR) 50 MG tablet Take 50 mg by mouth daily.   03/22/2023 Morning   magnesium hydroxide (MILK OF MAGNESIA) 400 MG/5ML suspension Take 30 mLs by mouth at  bedtime as needed for mild constipation.   Taking As Needed   NIFEdipine (PROCARDIA XL/NIFEDICAL XL) 60 MG 24 hr tablet Take 60 mg by mouth daily.   03/22/2023 Morning   rosuvastatin (CRESTOR) 20 MG tablet Take 20 mg by mouth at bedtime.    03/21/2023   torsemide (DEMADEX) 20 MG tablet Take 20 mg by mouth daily.   03/22/2023 Morning   Vitamin D, Ergocalciferol, (DRISDOL) 1.25 MG (50000 UNIT) CAPS capsule Take 50,000 Units by mouth every Monday.   03/20/2023   warfarin (COUMADIN) 7.5 MG tablet Take 7.5 mg by mouth every evening.   03/21/2023   SYMBICORT 160-4.5 MCG/ACT inhaler Inhale 2 puffs into the lungs.   03/21/2023    Assessment: 68 y.o. male with medical history significant of seizures, chronic A-fib on warfarin, polysubstance abuse, history of CVA, CKD 3A who  presents to the emergency department due to 2-week onset of bright red blood in his stools. Pt with mass  in rectum. Could be rectal prolapse. Biopsy taken. Plan to start with IV heparin per pharmacy to continue anticoagulation for now. EGD and colonoscopy 3/21 did not show any gastritis or bleeding. Okay to restart anticoagulation. GI states pt can have outpatient general eval if he doesn't rebleed with systemic anticoagulation.   INR 1.5 now with Vitamin K admin on 3/20 5mg  IV and 2.5mg  po   Goal of Therapy:  Heparin level 0.3-0.7 Monitor platelets by anticoagulation protocol: Yes   Plan:  Give 4000 units bolus x 1 Start heparin infusion at 1200 units/hr Check anti-Xa level in ~6-8 hours and daily while on heparin Continue to monitor H&H and platelets F/U in 24 hours restart Coumadin if no bleeding  Elder Cyphers, BS Pharm D, BCPS Clinical Pharmacist 03/24/2023,3:36 PM

## 2023-03-24 NOTE — Assessment & Plan Note (Signed)
 03-24-2023 chronic.

## 2023-03-24 NOTE — Care Management Important Message (Signed)
 Important Message  Patient Details  Name: Nathan Shepherd MRN: [id number] Date of Birth: 12-13-1955   Important Message Given:  Yes - Medicare IM     Corey Harold 03/24/2023, 11:25 AM

## 2023-03-24 NOTE — Anesthesia Preprocedure Evaluation (Addendum)
 Anesthesia Evaluation  Patient identified by MRN, date of birth, ID band Patient awake    Reviewed: Allergy & Precautions, H&P , NPO status , Patient's Chart, lab work & pertinent test results, reviewed documented beta blocker date and time   Airway Mallampati: II  TM Distance: >3 FB Neck ROM: full    Dental  (+) Dental Advisory Given, Poor Dentition, Missing   Pulmonary neg pulmonary ROS   Pulmonary exam normal breath sounds clear to auscultation       Cardiovascular Exercise Tolerance: Good hypertension, +CHF  Normal cardiovascular exam+ dysrhythmias Atrial Fibrillation  Rhythm:regular Rate:Normal  Great EF   Neuro/Psych Seizures -,  CVA  negative psych ROS   GI/Hepatic Neg liver ROS,,,Lower GI bleeding   Endo/Other  negative endocrine ROS    Renal/GU CRFRenal diseaseStage 3 CKD  negative genitourinary   Musculoskeletal   Abdominal   Peds  Hematology negative hematology ROS (+)   Anesthesia Other Findings   Reproductive/Obstetrics negative OB ROS                             Anesthesia Physical Anesthesia Plan  ASA: 3  Anesthesia Plan: General   Post-op Pain Management: Minimal or no pain anticipated   Induction: Intravenous  PONV Risk Score and Plan: Propofol infusion  Airway Management Planned: Nasal Cannula and Natural Airway  Additional Equipment: None  Intra-op Plan:   Post-operative Plan:   Informed Consent: I have reviewed the patients History and Physical, chart, labs and discussed the procedure including the risks, benefits and alternatives for the proposed anesthesia with the patient or authorized representative who has indicated his/her understanding and acceptance.     Dental Advisory Given  Plan Discussed with: CRNA  Anesthesia Plan Comments:         Anesthesia Quick Evaluation

## 2023-03-24 NOTE — Plan of Care (Signed)
  Problem: Education: Goal: Knowledge of General Education information will improve Description: Including pain rating scale, medication(s)/side effects and non-pharmacologic comfort measures Outcome: Progressing   Problem: Health Behavior/Discharge Planning: Goal: Ability to manage health-related needs will improve Outcome: Progressing   Problem: Clinical Measurements: Goal: Ability to maintain clinical measurements within normal limits will improve Outcome: Progressing Goal: Will remain free from infection Outcome: Progressing Goal: Diagnostic test results will improve Outcome: Progressing Goal: Respiratory complications will improve Outcome: Progressing Goal: Cardiovascular complication will be avoided Outcome: Progressing   Problem: Activity: Goal: Risk for activity intolerance will decrease Outcome: Progressing   Problem: Nutrition: Goal: Adequate nutrition will be maintained Outcome: Progressing   Problem: Elimination: Goal: Will not experience complications related to urinary retention Outcome: Progressing

## 2023-03-24 NOTE — Op Note (Signed)
 [facility] Patient Name: Nathan Shepherd Procedure Date: 03/24/2023 12:48 PM MRN: [id number] Date of Birth: January 14, 1955 Attending MD: Sanjuan Dame , MD, [id number] CSN: [id number] Age: 68 Admit Type: Inpatient Procedure:                Colonoscopy Indications:              Hematochezia, Acute post hemorrhagic anemia Providers:                Sanjuan Dame, MD, Sheran Fava, Tammy                            Vaught, RN, Elinor Parkinson Referring MD:              Medicines:                Monitored Anesthesia Care Complications:            No immediate complications. Estimated Blood Loss:     Estimated blood loss was minimal. Procedure:                Pre-Anesthesia Assessment:                           - Prior to the procedure, a History and Physical                            was performed, and patient medications and                            allergies were reviewed. The patient's tolerance of                            previous anesthesia was also reviewed. The risks                            and benefits of the procedure and the sedation                            options and risks were discussed with the patient.                            All questions were answered, and informed consent                            was obtained. Prior Anticoagulants: The patient has                            taken Coumadin (warfarin), last dose was 2 days                            prior to procedure. ASA Grade Assessment: III - A                            patient with severe systemic disease. After  reviewing the risks and benefits, the patient was                            deemed in satisfactory condition to undergo the                            procedure.                           After obtaining informed consent, the colonoscope                            was passed under direct vision. Throughout the                            procedure, the  patient's blood pressure, pulse, and                            oxygen saturations were monitored continuously. The                            [id number]) scope was introduced through the                            anus and advanced to the the cecum, identified by                            appendiceal orifice and ileocecal valve. The                            colonoscopy was performed without difficulty. The                            patient tolerated the procedure well. The quality                            of the bowel preparation was evaluated using the                            BBPS [facility] Bowel Preparation Scale) with scores                            of: Right Colon = 3, Transverse Colon = 3 and Left                            Colon = 3 (entire mucosa seen well with no residual                            staining, small fragments of stool or opaque                            liquid). The total BBPS score equals 9. The  ileocecal valve, appendiceal orifice, and rectum                            were photographed. Scope In: 1:11:55 PM Scope Out: 1:28:29 PM Scope Withdrawal Time: 0 hours 10 minutes 54 seconds  Total Procedure Duration: 0 hours 16 minutes 34 seconds  Findings:      The perianal exam findings include Protruding lesion beyound dentate       line , soft. This could be rectal prolapse but biopsy obtained to r/o       malignancy      Non-bleeding internal hemorrhoids were found during retroflexion. The       hemorrhoids were medium-sized.      There is no endoscopic evidence of bleeding or mass in the entire colon.      Moderate rectal prolapse was present. Biopsies were taken with a cold       forceps for histology. Impression:               - The perianal exam findings include Protruding                            lesion beyond dentate line , soft. This could be                            rectal prolapse but biopsy obtained to r/o                             malignancy                           - Non-bleeding internal hemorrhoids.                           - Rectal prolapse. Moderate Sedation:      Per Anesthesia Care Recommendation:           - Patient has a contact number available for                            emergencies. The signs and symptoms of potential                            delayed complications were discussed with the                            patient. Return to normal activities tomorrow.                            Written discharge instructions were provided to the                            patient.                           -No absolute GI contraindication to restart                            clincally  indicated anticoagulation while patient                            is closely monitored                           -Patient likely had hematochezia from anal                            protruding lesion in setting of supratherapuetic INR                           -Surgical evaluation ; if patient has no further                            overt bleed can be seen as outpatient otherwise if                            apparent bleed after starting anticoagulation                            inpatient surgical eval                           - Resume previous diet.                           - Continue present medications.                           - Await pathology results.                           - Repeat colonoscopy in 10 years for screening                            purposes. Procedure Code(s):        --- Professional ---                           [phone], Colonoscopy, flexible; with biopsy, single                            or multiple Diagnosis Code(s):        --- Professional ---                           K64.8, Other hemorrhoids                           K62.3, Rectal prolapse                           K92.1, Melena (includes Hematochezia)                           D62, Acute posthemorrhagic  anemia CPT copyright 2022 American Medical Association. All rights reserved. The codes documented  in this report are preliminary and upon coder review may  be revised to meet current compliance requirements. Sanjuan Dame, MD Sanjuan Dame, MD 03/24/2023 1:51:22 PM This report has been signed electronically. Number of Addenda: 0

## 2023-03-24 NOTE — Assessment & Plan Note (Addendum)
 03-24-2023 stable. At home, was on coumadin for [facility]. Pt on keppra and depakote for AEDs.  Interaction of Depakote with DOACs.

## 2023-03-24 NOTE — Progress Notes (Signed)
 Chart reviewed, patient not seen. Scheduled for EGD and colonoscopy today for concern for GI bleed. Plan for colonoscopy first and if no lesion identified then plans for EGD. Consent obtained from legal guardian yesterday.   Labs stable, INR down to 1.5 today.  CBC with hemoglobin improved from 10.5-11.2.  He has been hypertensive overnight but overall fairly stable this morning.  Per nursing he drinks all prep and after first enema this morning, stools were clear but tannish color with some mild specks.  Ordered additional dose of Dulcolax and advised nurse to give second enema.  She is to notify if any solid stool or opaque stool is present.  For now given stools were mostly clear after first enema, will proceed with procedures this morning.  Brooke Bonito, MSN, APRN, FNP-BC, AGACNP-BC [facility] at [facility]

## 2023-03-24 NOTE — Op Note (Signed)
 [facility] Patient Name: Nathan Shepherd Procedure Date: 03/24/2023 12:51 PM MRN: [id number] Date of Birth: 11/12/1955 Attending MD: Sanjuan Dame , MD, [id number] CSN: [id number] Age: 68 Admit Type: Inpatient Procedure:                Upper GI endoscopy Indications:              Acute post hemorrhagic anemia Providers:                Sanjuan Dame, MD, Sheran Fava, Tammy                            Vaught, RN, Elinor Parkinson Referring MD:              Medicines:                Monitored Anesthesia Care Complications:            No immediate complications. Estimated Blood Loss:     Estimated blood loss: none. Procedure:                Pre-Anesthesia Assessment:                           - Prior to the procedure, a History and Physical                            was performed, and patient medications and                            allergies were reviewed. The patient's tolerance of                            previous anesthesia was also reviewed. The risks                            and benefits of the procedure and the sedation                            options and risks were discussed with the patient.                            All questions were answered, and informed consent                            was obtained. Prior Anticoagulants: The patient has                            taken Coumadin (warfarin), last dose was 3 days                            prior to procedure. ASA Grade Assessment: III - A                            patient with severe systemic disease. After  reviewing the risks and benefits, the patient was                            deemed in satisfactory condition to undergo the                            procedure.                           After obtaining informed consent, the endoscope was                            passed under direct vision. Throughout the                            procedure, the patient's blood  pressure, pulse, and                            oxygen saturations were monitored continuously. The                            [id number] ([id number] scope was introduced through the                            mouth, and advanced to the second part of duodenum.                            The upper GI endoscopy was accomplished without                            difficulty. The patient tolerated the procedure                            well. Scope In: 1:04:07 PM Scope Out: 1:07:57 PM Total Procedure Duration: 0 hours 3 minutes 50 seconds  Findings:      The examined esophagus was normal.      Mild inflammation characterized by erythema was found in the gastric       antrum. Biopsies were taken with a cold forceps for histology.      The duodenal bulb and second portion of the duodenum were normal. Impression:               - Normal esophagus.                           - Gastritis. Biopsied.                           - Normal duodenal bulb and second portion of the                            duodenum. Moderate Sedation:      Per Anesthesia Care Recommendation:           Proceed with colonoscopy Procedure Code(s):        --- Professional ---  16109, Esophagogastroduodenoscopy, flexible,                            transoral; with biopsy, single or multiple Diagnosis Code(s):        --- Professional ---                           K29.70, Gastritis, unspecified, without bleeding                           D62, Acute posthemorrhagic anemia CPT copyright 2022 American Medical Association. All rights reserved. The codes documented in this report are preliminary and upon coder review may  be revised to meet current compliance requirements. Sanjuan Dame, MD Sanjuan Dame, MD 03/24/2023 1:10:39 PM This report has been signed electronically. Number of Addenda: 0

## 2023-03-24 NOTE — Assessment & Plan Note (Addendum)
 03-24-2023 Ingram 1.38 yesterday. Baseline scr 1.2-1.6. stop losartan. Start hydralazine 50 mg tid instead for HTN control. Repeat BMP in AM.

## 2023-03-24 NOTE — Progress Notes (Addendum)
   GI procedure note reviewed. Pt with mass rectum. Could be rectal prolapse. Biopsy taken. Will start with IV heparin gtts. GI states pt can have outpatient general eval if he doesn't rebleed with systemic anticoagulation. If he rebleeds on systemic anticoagulation, get inpatient surgery consult.  Pharmacy consulted for IV heparin and to restart coumadin in 24 hours if no bleeding on IV heparin. Pt will be coumadin-resistant due to IV vitamin K 5 mg given on admission.   Carollee Herter, DO [facility]

## 2023-03-24 NOTE — Assessment & Plan Note (Signed)
 03-24-2023 pt going for EGD/colonoscopy today by GI.

## 2023-03-24 NOTE — Progress Notes (Signed)
 Pt continues to choke then spit up per pt but sounds like vomit. Will hold tube feeds for now to see if improves.

## 2023-03-24 NOTE — Progress Notes (Signed)
 PHARMACY - ANTICOAGULATION CONSULT NOTE  Pharmacy Consult for Heparin Indication: atrial fibrillation  Allergies  Allergen Reactions   Penicillins Hives    Tolerated Zosyn    Patient Measurements: Height: 6\' 4"  (193 cm) Weight: 87.8 kg (193 lb 8 oz) IBW/kg (Calculated) : 86.8 HEPARIN DW (KG): 87.8   Vital Signs: Temp: 97.5 F (36.4 C) (03/21 2047) Temp Source: Oral (03/21 2047) BP: 126/95 (03/21 2047) Pulse Rate: 80 (03/21 2047)  Labs: Recent Labs    03/22/23 1945 03/23/23 0536 03/23/23 1328 03/24/23 0503 03/24/23 2208  HGB 10.8* 10.5*  --  11.2*  --   HCT 34.0* 33.1*  --  36.3*  --   PLT 190 171  --  181  --   LABPROT 66.9* 54.7* 25.4* 18.4*  --   INR 8.0* 6.1* 2.3* 1.5*  --   HEPARINUNFRC  --   --   --   --  0.31  CREATININE 1.84* 1.38*  --   --   --     Estimated Creatinine Clearance: 65.5 mL/min (A) (by C-G formula based on SCr of 1.38 mg/dL (H)).   Medical History: Past Medical History:  Diagnosis Date   CHF (congestive heart failure) (HCC)    CKD (chronic kidney disease) stage 3, GFR 30-59 ml/min (HCC)    Edema    Hyperlipemia    Hypertension    Seizures (HCC)     Medications:  Medications Prior to Admission  Medication Sig Dispense Refill Last Dose/Taking   acetaminophen (TYLENOL) 325 MG tablet Take 650 mg by mouth every 4 (four) hours as needed for mild pain (pain score 1-3).   Taking As Needed   amLODipine (NORVASC) 2.5 MG tablet Take 1 tablet (2.5 mg total) by mouth daily. 30 tablet 0 03/22/2023 Morning   aspirin EC 81 MG tablet Take 81 mg by mouth daily.   03/22/2023 Morning   Calcium Carbonate Antacid (ANTACID PO) Take 30 mLs by mouth 4 (four) times daily as needed (heartburn, indigestion). Antacid Suspension   Taking As Needed   carvedilol (COREG) 12.5 MG tablet Take 12.5 mg by mouth 2 (two) times daily with a meal.   03/22/2023 Morning   diphenhydrAMINE (BENADRYL) 25 MG tablet Take 25 mg by mouth every 6 (six) hours as needed for allergies or  itching.   Taking As Needed   divalproex (DEPAKOTE) 250 MG DR tablet Take 3 tablets (750 mg total) by mouth every 12 (twelve) hours. 180 tablet 0 03/22/2023 Morning   docusate sodium (COLACE) 100 MG capsule Take 100 mg by mouth at bedtime.   Past Week   donepezil (ARICEPT) 5 MG tablet Take 5 mg by mouth at bedtime.   03/21/2023   Emollient (EUCERIN) lotion Apply 1 Application topically daily. (Apply to legs and feet)   03/22/2023 Morning   fluocinolone 0.01 % cream Apply 1 application  topically 3 (three) times daily as needed (bug bites/rash).   Taking As Needed   guaiFENesin (ROBITUSSIN) 100 MG/5ML liquid Take 10 mLs by mouth every 6 (six) hours as needed for cough or to loosen phlegm.   Taking As Needed   levETIRAcetam (KEPPRA) 1000 MG tablet Take 1,000 mg by mouth 2 (two) times daily.   03/22/2023 Morning   loperamide (IMODIUM) 2 MG capsule Take 1 capsule (2 mg total) by mouth as needed for diarrhea or loose stools. (Patient taking differently: Take 2 mg by mouth as needed for diarrhea or loose stools. (Max 8 doses in 24 hours)) 30 capsule 0  Taking Differently   losartan (COZAAR) 50 MG tablet Take 50 mg by mouth daily.   03/22/2023 Morning   magnesium hydroxide (MILK OF MAGNESIA) 400 MG/5ML suspension Take 30 mLs by mouth at bedtime as needed for mild constipation.   Taking As Needed   NIFEdipine (PROCARDIA XL/NIFEDICAL XL) 60 MG 24 hr tablet Take 60 mg by mouth daily.   03/22/2023 Morning   rosuvastatin (CRESTOR) 20 MG tablet Take 20 mg by mouth at bedtime.    03/21/2023   torsemide (DEMADEX) 20 MG tablet Take 20 mg by mouth daily.   03/22/2023 Morning   Vitamin D, Ergocalciferol, (DRISDOL) 1.25 MG (50000 UNIT) CAPS capsule Take 50,000 Units by mouth every Monday.   03/20/2023   warfarin (COUMADIN) 7.5 MG tablet Take 7.5 mg by mouth every evening.   03/21/2023   SYMBICORT 160-4.5 MCG/ACT inhaler Inhale 2 puffs into the lungs.   03/21/2023    Assessment: 68 y.o. male with medical history significant of  seizures, chronic A-fib on warfarin, polysubstance abuse, history of CVA, CKD 3A who presents to the emergency department due to 2-week onset of bright red blood in his stools. Pt with mass  in rectum. Could be rectal prolapse. Biopsy taken. Plan to start with IV heparin per pharmacy to continue anticoagulation for now. EGD and colonoscopy 3/21 did not show any gastritis or bleeding. Okay to restart anticoagulation. GI states pt can have outpatient general eval if he doesn't rebleed with systemic anticoagulation.   INR 1.5 now with Vitamin K admin on 3/20 5mg  IV and 2.5mg  po  3/21 PM update:  Heparin level is therapeutic   Goal of Therapy:  Heparin level 0.3-0.7 units/mL Monitor platelets by anticoagulation protocol: Yes   Plan:  Cont heparin 1200 units/hr Heparin level with AM labs  Abran Duke, PharmD, BCPS Clinical Pharmacist Phone: [phone]

## 2023-03-24 NOTE — Assessment & Plan Note (Signed)
 03-24-2023 on crestor

## 2023-03-24 NOTE — Progress Notes (Signed)
 PROGRESS NOTE    Nathan Shepherd  AOZ:[id number] DOB: Feb 09, 1955 DOA: 03/22/2023 PCP: Rush Farmer, NP  Subjective: Pt seen and examined. Had prep for colonoscopy. RN performing tap water enemas.  Reviewed pt's chart. Pt is NOT on DOAC due to concomitant treatment with AEDs(depakote). This was decided during May 2024 admission.   Hospital Course: HPI:  Nathan Shepherd is a 68 y.o. male with medical history significant of seizures, chronic A-fib on warfarin, polysubstance abuse, history of CVA, CKD 3A who presents to the emergency department due to 2-week onset of bright red blood in his stools.  He complained of generalized weakness, but denies fever, chills, chest pain, shortness of breath, abdominal pain.   ED Course:  In the emergency department, BP was 148/103, other vital signs were within normal range.  Workup in the ED showed normocytic anemia with hemoglobin 10.8/34.0 (this was 13.3/42.3 on 02/22/2023) and normal BMP except for BUN/creatinine of 20/1.84 (baseline creatinine at 1.3-1.5).  Albumin 2.4.  Guaiac test was positive per ED medical record. Patient was treated with IV Protonix 40 mg x 1, IV NS 500 mL was given.  Significant Events: Admitted 03/22/2023 for GI bleeding, supratherapeutic INR   Significant Labs: WBC 4.4, HgB 10.8, plt 190 Na 141, K 3.8, CO2 of 31, BUN 28, Scr 1.84, glu 95 INR 8.0  Significant Imaging Studies:   Antibiotic Therapy: Anti-infectives (From admission, onward)    None       Procedures:   Consultants: GI    Assessment and Plan: * GI bleed 03-24-2023 pt going for EGD/colonoscopy today by GI.  Supratherapeutic INR 03-24-2023 on admission, INR 8.0. pt received 5 mg vitamin K IV. He will be coumadin resistant for a while. He will likely need lovenox bridging to get his INR therapeutic.  Essential hypertension 03-24-2023 stopping losartan due to AKI/CKD. Start hydralazine 50 mg tid instead.  Acute kidney injury  superimposed on chronic kidney disease (HCC) - Baseline scr 1.2-1.6 03-24-2023 [place] 1.38 yesterday. Baseline scr 1.2-1.6. stop losartan. Start hydralazine 50 mg tid instead for HTN control. Repeat BMP in AM.  Atrial fibrillation, chronic (HCC) - cannot be on DOAC due to concurrent treatment with Depakote. 03-24-2023 stable. At home, was on coumadin for [facility]. Pt on keppra and depakote for AEDs.  Interaction of Depakote with DOACs.   History of CVA (cerebrovascular accident) - not on DOAC due to interaction with AEDs 03-24-2023 stable.  Mixed hyperlipidemia 03-24-2023 on crestor  Hypoalbuminemia due to protein-calorie malnutrition (HCC) 03-24-2023 chronic.  Seizure (HCC) - cannot be on DOAC due to concurrent treatment with Depakote. 03-24-2023 on keppra 1000 mg bid and depakote.   DVT prophylaxis: SCDs Start: 03/23/23 0357    Code Status: Full Code Family Communication: no family at bedside Disposition Plan: return to group home Reason for continuing need for hospitalization: EGD/Colonoscopy today. May need bridging lovenox for systemic AC while INR is subtherapeutic.  Objective: Vitals:   03/23/23 1848 03/23/23 2240 03/24/23 0548 03/24/23 0725  BP: (!) 150/104 (!) 177/97 (!) 148/112 (!) 156/117  Pulse: 77 77 65 74  Resp: 18 17 17 18   Temp: (!) 97.5 F (36.4 C) 98 F (36.7 C) 98 F (36.7 C) 98.4 F (36.9 C)  TempSrc: Oral Oral Oral Oral  SpO2: 97% 97% 97% 98%  Weight:      Height:        Intake/Output Summary (Last 24 hours) at 03/24/2023 0926 Last data filed at 03/23/2023 1830 Gross per 24 hour  Intake  4.93 ml  Output --  Net 4.93 ml   Filed Weights   03/23/23 1500  Weight: 87.8 kg    Examination:  Physical Exam Vitals and nursing note reviewed.  Constitutional:      General: He is not in acute distress.    Appearance: He is not toxic-appearing or diaphoretic.  HENT:     Head: Normocephalic and atraumatic.     Nose: Nose normal.  Cardiovascular:     Rate and  Rhythm: Normal rate. Rhythm irregular.  Pulmonary:     Effort: Pulmonary effort is normal. No respiratory distress.     Breath sounds: Normal breath sounds. No wheezing.  Abdominal:     General: Abdomen is flat. Bowel sounds are normal. There is no distension.     Palpations: Abdomen is soft.  Musculoskeletal:     Right lower leg: No edema.     Left lower leg: No edema.  Skin:    General: Skin is warm and dry.     Capillary Refill: Capillary refill takes less than 2 seconds.  Neurological:     Mental Status: He is alert.     Comments: Awake and alert     Data Reviewed: I have personally reviewed following labs and imaging studies  CBC: Recent Labs  Lab 03/22/23 1945 03/23/23 0536 03/24/23 0503  WBC 4.4 5.2 4.2  NEUTROABS 2.7  --   --   HGB 10.8* 10.5* 11.2*  HCT 34.0* 33.1* 36.3*  MCV 86.7 86.2 87.3  PLT 190 171 181   Basic Metabolic Panel: Recent Labs  Lab 03/22/23 1945 03/23/23 0536  NA 141 142  K 3.8 3.3*  CL 100 105  CO2 31 31  GLUCOSE 95 93  BUN 28* 22  CREATININE 1.84* 1.38*  CALCIUM 8.9 8.7*  MG  --  1.7  PHOS  --  2.3*   GFR: Estimated Creatinine Clearance: 65.5 mL/min (A) (by C-G formula based on SCr of 1.38 mg/dL (H)). Liver Function Tests: Recent Labs  Lab 03/22/23 1945 03/23/23 0536  AST 13* 11*  ALT 9 9  ALKPHOS 45 43  BILITOT 0.4 0.6  PROT 6.6 6.0*  ALBUMIN 2.4* 2.1*   Coagulation Profile: Recent Labs  Lab 03/22/23 1945 03/23/23 0536 03/23/23 1328 03/24/23 0503  INR 8.0* 6.1* 2.3* 1.5*   BNP (last 3 results) Recent Labs    02/22/23 1817  BNP 322.9*   Scheduled Meds:  amLODipine  2.5 mg Oral Daily   bisacodyl  10 mg Oral Once   carvedilol  12.5 mg Oral BID WC   divalproex  750 mg Oral Q12H   feeding supplement  237 mL Oral BID BM   hydrALAZINE  50 mg Oral Q8H   levETIRAcetam  1,000 mg Oral BID   pantoprazole (PROTONIX) IV  40 mg Intravenous Q12H   rosuvastatin  20 mg Oral QHS   Continuous Infusions:  sodium  chloride 20 mL/hr at 03/23/23 1830   sodium chloride       LOS: 2 days   Time spent: 45 minutes  Carollee Herter, DO  [facility]  03/24/2023, 9:26 AM

## 2023-03-24 NOTE — Care Management Important Message (Signed)
 Important Message  Patient Details  Name: Nathan Shepherd MRN: [id number] Date of Birth: May 09, 1955   Important Message Given:  Yes - Medicare IM (reviewed letter with legal guardian Windle Guard at [phone])     Corey Harold 03/24/2023, 12:31 PM

## 2023-03-24 NOTE — Anesthesia Postprocedure Evaluation (Signed)
 Anesthesia Post Note  Patient: Tashaun Obey  Procedure(s) Performed: COLONOSCOPY EGD (ESOPHAGOGASTRODUODENOSCOPY)  Patient location during evaluation: Short Stay Anesthesia Type: General Level of consciousness: awake and alert Pain management: pain level controlled Vital Signs Assessment: post-procedure vital signs reviewed and stable Respiratory status: spontaneous breathing Cardiovascular status: blood pressure returned to baseline and stable Postop Assessment: no apparent nausea or vomiting Anesthetic complications: no   No notable events documented.   Last Vitals:  Vitals:   03/24/23 0900 03/24/23 1208  BP: (!) 159/99 (!) 155/109  Pulse: (!) 58 81  Resp:  (!) 26  Temp:  36.8 C  SpO2:  98%    Last Pain:  Vitals:   03/24/23 1257  TempSrc:   PainSc: 0-No pain                 Neshawn Aird

## 2023-03-24 NOTE — Assessment & Plan Note (Addendum)
 03-24-2023 on keppra 1000 mg bid and depakote.

## 2023-03-24 NOTE — Assessment & Plan Note (Signed)
 03-24-2023 on admission, INR 8.0. pt received 5 mg vitamin K IV. He will be coumadin resistant for a while. He will likely need lovenox bridging to get his INR therapeutic.

## 2023-03-24 NOTE — Hospital Course (Signed)
 HPI:  Nathan Shepherd is a 68 y.o. male with medical history significant of seizures, chronic A-fib on warfarin, polysubstance abuse, history of CVA, CKD 3A who presents to the emergency department due to 2-week onset of bright red blood in his stools.  He complained of generalized weakness, but denies fever, chills, chest pain, shortness of breath, abdominal pain.   ED Course:  In the emergency department, BP was 148/103, other vital signs were within normal range.  Workup in the ED showed normocytic anemia with hemoglobin 10.8/34.0 (this was 13.3/42.3 on 02/22/2023) and normal BMP except for BUN/creatinine of 20/1.84 (baseline creatinine at 1.3-1.5).  Albumin 2.4.  Guaiac test was positive per ED medical record. Patient was treated with IV Protonix 40 mg x 1, IV NS 500 mL was given.  Significant Events: Admitted 03/22/2023 for GI bleeding, supratherapeutic INR   Significant Labs: WBC 4.4, HgB 10.8, plt 190 Na 141, K 3.8, CO2 of 31, BUN 28, Scr 1.84, glu 95 INR 8.0  Significant Imaging Studies:   Antibiotic Therapy: Anti-infectives (From admission, onward)    None       Procedures:   Consultants: GI

## 2023-03-24 NOTE — Assessment & Plan Note (Signed)
 03-24-2023 stable.

## 2023-03-24 NOTE — Assessment & Plan Note (Signed)
 03-24-2023 stopping losartan due to AKI/CKD. Start hydralazine 50 mg tid instead.

## 2023-03-25 ENCOUNTER — Encounter (HOSPITAL_COMMUNITY): Payer: Self-pay | Admitting: Internal Medicine

## 2023-03-25 DIAGNOSIS — K921 Melena: Secondary | ICD-10-CM | POA: Diagnosis not present

## 2023-03-25 LAB — COMPREHENSIVE METABOLIC PANEL
ALT: 9 U/L (ref 0–44)
AST: 12 U/L — ABNORMAL LOW (ref 15–41)
Albumin: 1.8 g/dL — ABNORMAL LOW (ref 3.5–5.0)
Alkaline Phosphatase: 39 U/L (ref 38–126)
Anion gap: 4 — ABNORMAL LOW (ref 5–15)
BUN: 14 mg/dL (ref 8–23)
CO2: 29 mmol/L (ref 22–32)
Calcium: 8.7 mg/dL — ABNORMAL LOW (ref 8.9–10.3)
Chloride: 106 mmol/L (ref 98–111)
Creatinine, Ser: 1.22 mg/dL (ref 0.61–1.24)
GFR, Estimated: >60 mL/min (ref >60)
Glucose, Bld: 103 mg/dL — ABNORMAL HIGH (ref 70–99)
Potassium: 3.2 mmol/L — ABNORMAL LOW (ref 3.5–5.1)
Sodium: 139 mmol/L (ref 135–145)
Total Bilirubin: 0.7 mg/dL (ref 0.0–1.2)
Total Protein: 5.3 g/dL — ABNORMAL LOW (ref 6.5–8.1)

## 2023-03-25 LAB — CBC WITH DIFFERENTIAL/PLATELET
Abs Immature Granulocytes: 0.03 10*3/uL (ref 0.00–0.07)
Basophils Absolute: 0 10*3/uL (ref 0.0–0.1)
Basophils Relative: 0 %
Eosinophils Absolute: 0.2 10*3/uL (ref 0.0–0.5)
Eosinophils Relative: 5 %
HCT: 32 % — ABNORMAL LOW (ref 39.0–52.0)
Hemoglobin: 9.8 g/dL — ABNORMAL LOW (ref 13.0–17.0)
Immature Granulocytes: 1 %
Lymphocytes Relative: 34 %
Lymphs Abs: 1.6 10*3/uL (ref 0.7–4.0)
MCH: 26.8 pg (ref 26.0–34.0)
MCHC: 30.6 g/dL (ref 30.0–36.0)
MCV: 87.7 fL (ref 80.0–100.0)
Monocytes Absolute: 0.7 10*3/uL (ref 0.1–1.0)
Monocytes Relative: 14 %
Neutro Abs: 2.2 10*3/uL (ref 1.7–7.7)
Neutrophils Relative %: 46 %
Platelets: 163 10*3/uL (ref 150–400)
RBC: 3.65 MIL/uL — ABNORMAL LOW (ref 4.22–5.81)
RDW: 14.5 % (ref 11.5–15.5)
WBC: 4.8 10*3/uL (ref 4.0–10.5)
nRBC: 0 % (ref 0.0–0.2)

## 2023-03-25 LAB — PROTIME-INR
INR: 1.3 — ABNORMAL HIGH (ref 0.8–1.2)
Prothrombin Time: 16 s — ABNORMAL HIGH (ref 11.4–15.2)

## 2023-03-25 LAB — MAGNESIUM: Magnesium: 1.6 mg/dL — ABNORMAL LOW (ref 1.7–2.4)

## 2023-03-25 LAB — HEMOGLOBIN AND HEMATOCRIT, BLOOD
HCT: 35.9 % — ABNORMAL LOW (ref 39.0–52.0)
Hemoglobin: 11.4 g/dL — ABNORMAL LOW (ref 13.0–17.0)

## 2023-03-25 LAB — HEPARIN LEVEL (UNFRACTIONATED): Heparin Unfractionated: 0.22 [IU]/mL — ABNORMAL LOW (ref 0.30–0.70)

## 2023-03-25 MED ORDER — POTASSIUM CHLORIDE CRYS ER 20 MEQ PO TBCR
40.0000 meq | EXTENDED_RELEASE_TABLET | Freq: Two times a day (BID) | ORAL | Status: DC
  Administered 2023-03-25: 40 meq via ORAL
  Filled 2023-03-25: qty 2

## 2023-03-25 MED ORDER — ENOXAPARIN SODIUM 120 MG/0.8ML IJ SOSY: 1.5000 mg/kg | PREFILLED_SYRINGE | INTRAMUSCULAR | 0 refills | Status: DC

## 2023-03-25 MED ORDER — ENSURE ENLIVE PO LIQD: 237.0000 mL | Freq: Two times a day (BID) | ORAL | 12 refills | Status: DC

## 2023-03-25 NOTE — Discharge Summary (Signed)
 Physician Discharge Summary  Chosen Nathan Shepherd:[id number] DOB: 05-Oct-1955 DOA: 03/22/2023  PCP: Rush Farmer, NP  Admit date: 03/22/2023  Discharge date: 03/25/2023  Admitted From: Group home  Disposition: Group home  Recommendations for Outpatient Follow-up:  Follow up with PCP in 1-2 weeks Follow-up with general surgery Dr. Henreitta Leber outpatient as recommended by GI due to protruding lesion noted on perianal exam possibly related to rectal prolapse Continue on Lovenox and Coumadin as prescribed and monitor INR until therapeutic Continue other home medications as prior  Home Health: None  Equipment/Devices: None  Discharge Condition:Stable  CODE STATUS: Full  Diet recommendation: Heart Healthy  Brief/Interim Summary: Nathan Shepherd is a 68 y.o. male with medical history significant of seizures, chronic A-fib on warfarin, polysubstance abuse, history of CVA, CKD 3A who presents to the emergency department due to 2-week onset of bright red blood in his stools.  He complained of generalized weakness, but denies fever, chills, chest pain, shortness of breath, abdominal pain.  Patient underwent EGD and colonoscopy on 3/21 with no significant findings aside from some nonbleeding internal hemorrhoids as well as protruding lesion with possible rectal prolapse noted on colonoscopy.  Recommendations are to follow-up for this with GI outpatient.  No further acute bleeding noted and hemoglobin remained stable.  Okay to resume on Coumadin and remain on Lovenox as well for bridging until INR is stable and therapeutic.  Discharge Diagnoses:  Principal Problem:   GI bleed Active Problems:   Supratherapeutic INR   Atrial fibrillation, chronic (HCC) - cannot be on DOAC due to concurrent treatment with Depakote.   Acute kidney injury superimposed on chronic kidney disease (HCC) - Baseline scr 1.2-1.6   Essential hypertension   Seizure (HCC) - cannot be on DOAC due to concurrent  treatment with Depakote.   Hypoalbuminemia due to protein-calorie malnutrition (HCC)   Mixed hyperlipidemia   History of CVA (cerebrovascular accident) - not on DOAC due to interaction with AEDs   Anemia   Elevated INR   Rectal prolapse  Principal discharge diagnosis: GI bleed in the setting of supratherapeutic INR while on Coumadin for atrial fibrillation.  Discharge Instructions  Discharge Instructions     Ambulatory referral to General Surgery   Complete by: As directed    Diet - low sodium heart healthy   Complete by: As directed    Increase activity slowly   Complete by: As directed       Allergies as of 03/25/2023       Reactions   Penicillins Hives   Tolerated Zosyn        Medication List     STOP taking these medications    amLODipine 2.5 MG tablet Commonly known as: NORVASC       TAKE these medications    acetaminophen 325 MG tablet Commonly known as: TYLENOL Take 650 mg by mouth every 4 (four) hours as needed for mild pain (pain score 1-3).   ANTACID PO Take 30 mLs by mouth 4 (four) times daily as needed (heartburn, indigestion). Antacid Suspension   aspirin EC 81 MG tablet Take 81 mg by mouth daily.   carvedilol 12.5 MG tablet Commonly known as: COREG Take 12.5 mg by mouth 2 (two) times daily with a meal.   diphenhydrAMINE 25 MG tablet Commonly known as: BENADRYL Take 25 mg by mouth every 6 (six) hours as needed for allergies or itching.   divalproex 250 MG DR tablet Commonly known as: DEPAKOTE Take 3 tablets (750 mg total) by  mouth every 12 (twelve) hours.   docusate sodium 100 MG capsule Commonly known as: COLACE Take 100 mg by mouth at bedtime.   donepezil 5 MG tablet Commonly known as: ARICEPT Take 5 mg by mouth at bedtime.   enoxaparin 120 MG/0.8ML injection Commonly known as: LOVENOX Inject 0.88 mLs (132 mg total) into the skin daily.   eucerin lotion Apply 1 Application topically daily. (Apply to legs and feet)    feeding supplement Liqd Take 237 mLs by mouth 2 (two) times daily between meals.   fluocinolone 0.01 % cream Apply 1 application  topically 3 (three) times daily as needed (bug bites/rash).   guaiFENesin 100 MG/5ML liquid Commonly known as: ROBITUSSIN Take 10 mLs by mouth every 6 (six) hours as needed for cough or to loosen phlegm.   levETIRAcetam 1000 MG tablet Commonly known as: KEPPRA Take 1,000 mg by mouth 2 (two) times daily.   loperamide 2 MG capsule Commonly known as: IMODIUM Take 1 capsule (2 mg total) by mouth as needed for diarrhea or loose stools. What changed: additional instructions   losartan 50 MG tablet Commonly known as: COZAAR Take 50 mg by mouth daily.   magnesium hydroxide 400 MG/5ML suspension Commonly known as: MILK OF MAGNESIA Take 30 mLs by mouth at bedtime as needed for mild constipation.   NIFEdipine 60 MG 24 hr tablet Commonly known as: PROCARDIA XL/NIFEDICAL XL Take 60 mg by mouth daily.   rosuvastatin 20 MG tablet Commonly known as: CRESTOR Take 20 mg by mouth at bedtime.   Symbicort 160-4.5 MCG/ACT inhaler Generic drug: budesonide-formoterol Inhale 2 puffs into the lungs.   torsemide 20 MG tablet Commonly known as: DEMADEX Take 20 mg by mouth daily.   Vitamin D (Ergocalciferol) 1.25 MG (50000 UNIT) Caps capsule Commonly known as: DRISDOL Take 50,000 Units by mouth every Monday.   warfarin 7.5 MG tablet Commonly known as: COUMADIN Take 7.5 mg by mouth every evening.        Follow-up Information     Rush Farmer, NP. Schedule an appointment as soon as possible for a visit in 1 week(s).   Specialty: Nurse Practitioner Contact information: [address] [place] [address] [phone]         Lucretia Roers, MD. Go to.   Specialty: General Surgery Contact information: [address] [place] [facility] [address] [phone]                Allergies  Allergen Reactions   Penicillins Hives     Tolerated Zosyn    Consultations: GI   Procedures/Studies: No results found.   Discharge Exam: Vitals:   03/25/23 0614 03/25/23 0744  BP: (!) 140/113 (!) 138/101  Pulse: 80 62  Resp: 20   Temp: 97.8 F (36.6 C) 98.2 F (36.8 C)  SpO2: 99% 98%   Vitals:   03/24/23 1713 03/24/23 2047 03/25/23 0614 03/25/23 0744  BP: (!) 145/88 (!) 126/95 (!) 140/113 (!) 138/101  Pulse: 69 80 80 62  Resp:  (!) 22 20   Temp:  (!) 97.5 F (36.4 C) 97.8 F (36.6 C) 98.2 F (36.8 C)  TempSrc:  Oral Oral Oral  SpO2:  98% 99% 98%  Weight:      Height:        General: Pt is alert, awake, not in acute distress Cardiovascular: RRR, S1/S2 +, no rubs, no gallops Respiratory: CTA bilaterally, no wheezing, no rhonchi Abdominal: Soft, NT, ND, bowel sounds + Extremities: no edema, no cyanosis  The results of significant diagnostics from this hospitalization (including imaging, microbiology, ancillary and laboratory) are listed below for reference.     Microbiology: No results found for this or any previous visit (from the past 240 hours).   Labs: BNP (last 3 results) Recent Labs    02/22/23 1817  BNP 322.9*   Basic Metabolic Panel: Recent Labs  Lab 03/22/23 1945 03/23/23 0536 03/25/23 0425  NA 141 142 139  K 3.8 3.3* 3.2*  CL 100 105 106  CO2 31 31 29   GLUCOSE 95 93 103*  BUN 28* 22 14  CREATININE 1.84* 1.38* 1.22  CALCIUM 8.9 8.7* 8.7*  MG  --  1.7 1.6*  PHOS  --  2.3*  --    Liver Function Tests: Recent Labs  Lab 03/22/23 1945 03/23/23 0536 03/25/23 0425  AST 13* 11* 12*  ALT 9 9 9   ALKPHOS 45 43 39  BILITOT 0.4 0.6 0.7  PROT 6.6 6.0* 5.3*  ALBUMIN 2.4* 2.1* 1.8*   No results for input(s): "LIPASE", "AMYLASE" in the last 168 hours. No results for input(s): "AMMONIA" in the last 168 hours. CBC: Recent Labs  Lab 03/22/23 1945 03/23/23 0536 03/24/23 0503 03/25/23 0425 03/25/23 0940  WBC 4.4 5.2 4.2 4.8  --   NEUTROABS 2.7  --   --  2.2  --   HGB 10.8*  10.5* 11.2* 9.8* 11.4*  HCT 34.0* 33.1* 36.3* 32.0* 35.9*  MCV 86.7 86.2 87.3 87.7  --   PLT 190 171 181 163  --    Cardiac Enzymes: No results for input(s): "CKTOTAL", "CKMB", "CKMBINDEX", "TROPONINI" in the last 168 hours. BNP: Invalid input(s): "POCBNP" CBG: No results for input(s): "GLUCAP" in the last 168 hours. D-Dimer No results for input(s): "DDIMER" in the last 72 hours. Hgb A1c No results for input(s): "HGBA1C" in the last 72 hours. Lipid Profile No results for input(s): "CHOL", "HDL", "LDLCALC", "TRIG", "CHOLHDL", "LDLDIRECT" in the last 72 hours. Thyroid function studies No results for input(s): "TSH", "T4TOTAL", "T3FREE", "THYROIDAB" in the last 72 hours.  Invalid input(s): "FREET3" Anemia work up No results for input(s): "VITAMINB12", "FOLATE", "FERRITIN", "TIBC", "IRON", "RETICCTPCT" in the last 72 hours. Urinalysis    Component Value Date/Time   COLORURINE YELLOW (A) 02/22/2023 1803   APPEARANCEUR CLEAR (A) 02/22/2023 1803   LABSPEC 1.011 02/22/2023 1803   PHURINE 7.0 02/22/2023 1803   GLUCOSEU NEGATIVE 02/22/2023 1803   HGBUR SMALL (A) 02/22/2023 1803   BILIRUBINUR NEGATIVE 02/22/2023 1803   KETONESUR 5 (A) 02/22/2023 1803   PROTEINUR NEGATIVE 02/22/2023 1803   NITRITE NEGATIVE 02/22/2023 1803   LEUKOCYTESUR NEGATIVE 02/22/2023 1803   Sepsis Labs Recent Labs  Lab 03/22/23 1945 03/23/23 0536 03/24/23 0503 03/25/23 0425  WBC 4.4 5.2 4.2 4.8   Microbiology No results found for this or any previous visit (from the past 240 hours).   Time coordinating discharge: 35 minutes  SIGNED:   Erick Blinks, DO [facility] 03/25/2023, 11:42 AM  If 7PM-7AM, please contact night-coverage [email or web address]

## 2023-03-25 NOTE — [place] FL2 (Signed)
 [place] MEDICAID FL2 LEVEL OF CARE FORM     IDENTIFICATION  Patient Name: Nathan Shepherd Birthdate: 01/11/1955 Sex: male Admission Date (Current Location): 03/22/2023  [facility] and [place] Number:  [employer] and Address:  [facility],  [address] [place] [address]      Provider Number: [id number]  Attending Physician Name and Address:  Erick Blinks, DO  Relative Name and Phone Number:  Barbie Banner  Group home adm - [phone]    Current Level of Care: Hospital Recommended Level of Care: [facility] Prior Approval Number:    Date Approved/Denied:   PASRR Number:    Discharge Plan: Other (Comment) (Family Care Home)    Current Diagnoses: Patient Active Problem List   Diagnosis Date Noted   Anemia 03/24/2023   Elevated INR 03/24/2023   Rectal prolapse 03/24/2023   Supratherapeutic INR 03/23/2023   Hypoalbuminemia due to protein-calorie malnutrition (HCC) 03/23/2023   Essential hypertension 03/23/2023   Mixed hyperlipidemia 03/23/2023   History of CVA (cerebrovascular accident) - not on DOAC due to interaction with AEDs 03/23/2023   GI bleed 03/22/2023   Seizure (HCC) - cannot be on DOAC due to concurrent treatment with Depakote. 10/22/2022   Atrial fibrillation, chronic (HCC) - cannot be on DOAC due to concurrent treatment with Depakote. 08/25/2021   Acute kidney injury superimposed on chronic kidney disease (HCC) - Baseline scr 1.2-1.6 08/25/2021    Orientation RESPIRATION BLADDER Height & Weight     Self, Time, Situation, Place  Normal Continent Weight: 193 lb 8 oz (87.8 kg) Height:  6\' 4"  (193 cm)  BEHAVIORAL SYMPTOMS/MOOD NEUROLOGICAL BOWEL NUTRITION STATUS      Continent Diet (Regular)  AMBULATORY STATUS COMMUNICATION OF NEEDS Skin   Independent Verbally Normal                       Personal Care Assistance Level of Assistance  Bathing, Feeding, Dressing Bathing Assistance: Independent Feeding  assistance: Independent Dressing Assistance: Independent     Functional Limitations Info  Sight, Hearing, Speech Sight Info: Adequate Hearing Info: Adequate Speech Info: Adequate    SPECIAL CARE FACTORS FREQUENCY                       Contractures Contractures Info: Not present    Additional Factors Info  Code Status, Allergies Code Status Info: FULL Allergies Info: Penicillins           Current Medications (03/25/2023):  This is the current hospital active medication list Current Facility-Administered Medications  Medication Dose Route Frequency Provider Last Rate Last Admin   amLODipine (NORVASC) tablet 2.5 mg  2.5 mg Oral Daily Adefeso, Oladapo, DO   2.5 mg at 03/25/23 0951   carvedilol (COREG) tablet 12.5 mg  12.5 mg Oral BID WC Adefeso, Oladapo, DO   12.5 mg at 03/25/23 0753   divalproex (DEPAKOTE) DR tablet 750 mg  750 mg Oral Q12H Carollee Herter, DO   750 mg at 03/25/23 0950   feeding supplement (ENSURE ENLIVE / ENSURE PLUS) liquid 237 mL  237 mL Oral BID BM Adefeso, Oladapo, DO   237 mL at 03/25/23 0949   heparin ADULT infusion 100 units/mL (25000 units/265mL)  1,200 Units/hr Intravenous Continuous Carollee Herter, DO 12 mL/hr at 03/25/23 0958 1,200 Units/hr at 03/25/23 0958   hydrALAZINE (APRESOLINE) injection 10 mg  10 mg Intravenous Q4H PRN Azucena Fallen, MD   10 mg at 03/23/23  1249   hydrALAZINE (APRESOLINE) tablet 50 mg  50 mg Oral Q8H Carollee Herter, DO   50 mg at 03/25/23 2956   levETIRAcetam (KEPPRA) tablet 1,000 mg  1,000 mg Oral BID Adefeso, Oladapo, DO   1,000 mg at 03/25/23 0950   pantoprazole (PROTONIX) EC tablet 40 mg  40 mg Oral BID Carollee Herter, DO   40 mg at 03/25/23 0951   potassium chloride SA (KLOR-CON M) CR tablet 40 mEq  40 mEq Oral BID Maurilio Lovely D, DO   40 mEq at 03/25/23 2130   rosuvastatin (CRESTOR) tablet 20 mg  20 mg Oral QHS Adefeso, Oladapo, DO   20 mg at 03/24/23 2221   sodium chloride 0.9 % bolus 500 mL  500 mL Intravenous Once Frankey Shown, DO         Discharge Medications: Allergies as of 03/25/2023       Reactions   Penicillins Hives   Tolerated Zosyn        Medication List     STOP taking these medications    amLODipine 2.5 MG tablet Commonly known as: NORVASC       TAKE these medications    acetaminophen 325 MG tablet Commonly known as: TYLENOL Take 650 mg by mouth every 4 (four) hours as needed for mild pain (pain score 1-3).   ANTACID PO Take 30 mLs by mouth 4 (four) times daily as needed (heartburn, indigestion). Antacid Suspension   aspirin EC 81 MG tablet Take 81 mg by mouth daily.   carvedilol 12.5 MG tablet Commonly known as: COREG Take 12.5 mg by mouth 2 (two) times daily with a meal.   diphenhydrAMINE 25 MG tablet Commonly known as: BENADRYL Take 25 mg by mouth every 6 (six) hours as needed for allergies or itching.   divalproex 250 MG DR tablet Commonly known as: DEPAKOTE Take 3 tablets (750 mg total) by mouth every 12 (twelve) hours.   docusate sodium 100 MG capsule Commonly known as: COLACE Take 100 mg by mouth at bedtime.   donepezil 5 MG tablet Commonly known as: ARICEPT Take 5 mg by mouth at bedtime.   enoxaparin 120 MG/0.8ML injection Commonly known as: LOVENOX Inject 0.88 mLs (132 mg total) into the skin daily.   eucerin lotion Apply 1 Application topically daily. (Apply to legs and feet)   feeding supplement Liqd Take 237 mLs by mouth 2 (two) times daily between meals.   fluocinolone 0.01 % cream Apply 1 application  topically 3 (three) times daily as needed (bug bites/rash).   guaiFENesin 100 MG/5ML liquid Commonly known as: ROBITUSSIN Take 10 mLs by mouth every 6 (six) hours as needed for cough or to loosen phlegm.   levETIRAcetam 1000 MG tablet Commonly known as: KEPPRA Take 1,000 mg by mouth 2 (two) times daily.   loperamide 2 MG capsule Commonly known as: IMODIUM Take 1 capsule (2 mg total) by mouth as needed for diarrhea or loose  stools. What changed: additional instructions   losartan 50 MG tablet Commonly known as: COZAAR Take 50 mg by mouth daily.   magnesium hydroxide 400 MG/5ML suspension Commonly known as: MILK OF MAGNESIA Take 30 mLs by mouth at bedtime as needed for mild constipation.   NIFEdipine 60 MG 24 hr tablet Commonly known as: PROCARDIA XL/NIFEDICAL XL Take 60 mg by mouth daily.   rosuvastatin 20 MG tablet Commonly known as: CRESTOR Take 20 mg by mouth at bedtime.   Symbicort 160-4.5 MCG/ACT inhaler Generic drug: budesonide-formoterol Inhale 2  puffs into the lungs.   torsemide 20 MG tablet Commonly known as: DEMADEX Take 20 mg by mouth daily.   Vitamin D (Ergocalciferol) 1.25 MG (50000 UNIT) Caps capsule Commonly known as: DRISDOL Take 50,000 Units by mouth every Monday.   warfarin 7.5 MG tablet Commonly known as: COUMADIN Take 7.5 mg by mouth every evening.         Relevant Imaging Results:  Relevant Lab Results:   Additional Information SSN: [id number], [address]

## 2023-03-25 NOTE — TOC Transition Note (Signed)
 Transition of Care [facility]) - Discharge Note   Patient Details  Name: Nathan Shepherd MRN: [id number] Date of Birth: 31-May-1955  Transition of Care [facility] CM/SW Contact:  Villa Herb, LCSWA Phone Number: 03/25/2023, 1:47 PM   Clinical Narrative:    CSW updated that pt is medically stable for D/C back to facility. CSW spoke to Mr. Humphrey who states that they can accept pt back and will transport pt. It was requested that CSW fax D/C summary and Fl2 to facility and print a copy for the D/C packet. CSW completed this and updated RN of plan. TOC signing off.   Final next level of care: Group Home Barriers to Discharge: Barriers Resolved   Patient Goals and CMS Choice Patient states their goals for this hospitalization and ongoing recovery are:: Group Home CMS Medicare.gov Compare Post Acute Care list provided to:: Legal Guardian Choice offered to / list presented to : [facility] POA / Guardian      Discharge Placement                       Discharge Plan and Services Additional resources added to the After Visit Summary for                                       Social Drivers of Health (SDOH) Interventions SDOH Screenings   Food Insecurity: No Food Insecurity (03/23/2023)  Housing: Low Risk  (03/23/2023)  Transportation Needs: No Transportation Needs (03/23/2023)  Utilities: Not At Risk (03/23/2023)  Social Connections: Socially Isolated (03/23/2023)  Tobacco Use: Low Risk  (03/25/2023)     Readmission Risk Interventions     No data to display

## 2023-03-27 ENCOUNTER — Encounter (HOSPITAL_COMMUNITY): Payer: Self-pay | Admitting: Gastroenterology

## 2023-03-29 ENCOUNTER — Telehealth (INDEPENDENT_AMBULATORY_CARE_PROVIDER_SITE_OTHER): Payer: Self-pay

## 2023-03-29 ENCOUNTER — Other Ambulatory Visit (HOSPITAL_COMMUNITY): Payer: Self-pay | Admitting: Gastroenterology

## 2023-03-29 DIAGNOSIS — A048 Other specified bacterial intestinal infections: Secondary | ICD-10-CM

## 2023-03-29 LAB — SURGICAL PATHOLOGY

## 2023-03-29 MED ORDER — OMEPRAZOLE 20 MG PO CPDR: 20.0000 mg | DELAYED_RELEASE_CAPSULE | Freq: Two times a day (BID) | ORAL | 0 refills | Status: DC

## 2023-03-29 MED ORDER — BISMUTH/METRONIDAZ/TETRACYCLIN 140-125-125 MG PO CAPS: 3.0000 | ORAL_CAPSULE | Freq: Four times a day (QID) | ORAL | 0 refills | Status: DC

## 2023-03-29 NOTE — Telephone Encounter (Signed)
 Patient pharmacy Nathan Shepherd at [facility] called to report Pylera not covered and if they split it to the three different drugs Bismuth, Metronidazole, these two are covered,but insurance will not cover Tetracycline. They will only pay for the Minocycline, or Doxycycline. Please advise. Thanks

## 2023-03-29 NOTE — Progress Notes (Signed)
 I reviewed the pathology results. [name], can you send her a letter with the findings as described below please?  Repeat colonoscopy in 10 years Repeat EGD in 3 years for intestinal metaplasia   Thanks,  Vista Lawman, MD Gastroenterology and Hepatology [facility]  ---------------------------------------------------------------------------------------------  [facility] [address], [address], [place], [place] [address] Phone:  [phone]   03/29/23 Nathan Shepherd, [place]   Dear [name],  I am writing to inform you that the biopsies taken during your recent endoscopic examination showed:  A. STOMACH, BIOPSY:  - Gastric antral mucosa with chronic gastritis, see comment  - Focal intestinal metaplasia, negative for dysplasia   Immunohistochemical stain for Helicobacter pylori is positive - only  rare H. pylori organisms are highlighted.   B. ANAL, LESION, BIOPSY:  - Polypoid colonic mucosa with glandular degenerative changes,  muscularization of lamina propria and ulceration, consistent with  mucosal prolapse  - Negative for dysplasia or malignancy    What does this mean?  I am writing to let you know the results of your recent endoscopy (EGD).  You were found to have an infection called H. pylori, which is a bacteria that lives in the stomach. We will send you a pack of medications to take for 14 days Take them all . It is VERY IMPORTANT that you take all of the medications as directed. If the infection is not fully treated, in can increase your risk of stomach cancer.  During this time its important that you take half the dose of warfarin and get your INR checked atleast 1-2 times during the therapy   Also I advise you to see the surgeon for rectal prolapse which was bleeding .  Also I value your feedback , so if you get a  survey , please take the time to fill it out and thank you for choosing [facility]  Please call us at [phone] if you have persistent problems or have questions about your condition that have not been fully answered at this time.  Sincerely,  Vista Lawman, MD Gastroenterology and Hepatology

## 2023-03-30 ENCOUNTER — Encounter (INDEPENDENT_AMBULATORY_CARE_PROVIDER_SITE_OTHER): Payer: Self-pay | Admitting: *Deleted

## 2023-03-30 NOTE — Telephone Encounter (Signed)
 He can have doxycycline 100mg  BID for 14 days along with bismuth 300 TID , Metronidazole 500mg  TID and PPI BID all for 14 days . Again advise them to decrease warfarin dose by half during the time of treatment and check INR frequently

## 2023-03-30 NOTE — Telephone Encounter (Signed)
 I have called Melissa at [facility] and given her the new medications dose, strength and frequency. She will make sure this is given to the patient.

## 2023-03-30 NOTE — Telephone Encounter (Signed)
 Called [facility] and spoke to caregiver there Siera and discussed with her that patient had  h pylori and need to take all 4 meds prescribed and to cut coumadin dose in half while taking treatment and have INR checked more frequently while on treatment at least  1-2 times while on treatment. She said a doctor comes out to check those and she would call him now to let them know it needs to be checked more frequently. She verbalized understanding of all

## 2023-04-26 ENCOUNTER — Ambulatory Visit: Admitting: Gastroenterology

## 2023-04-26 NOTE — Progress Notes (Deleted)
 GI Office Note    Referring Provider: Gammon, Chrystal, NP Primary Care Physician:  Gammon, Chrystal, NP  Primary Gastroenterologist:  Chief Complaint   No chief complaint on file.   History of Present Illness   Nathan Shepherd is a 69 y.o. male presenting today    H.pylori eradication))))))  Colonosocpy 03/2023: -perianal exam findings include protruding lesion beyond dentate line, soft. Could be rectal prolapse but biopsy obtained, bx c/w with mucosal prolapse, neg for dysplasia or malignancy -nonbleeding internal hemorrhoids -rectal prolapse -repeat colonoscopy 10 years  EGD 03/2023: -normal esophagus -gastritis, focal intestinal metaplasia, neg for dysplasia, H.pylori positive -egd in 3 years for intestinal metaplasia Medications   Current Outpatient Medications  Medication Sig Dispense Refill   acetaminophen  (TYLENOL ) 325 MG tablet Take 650 mg by mouth every 4 (four) hours as needed for mild pain (pain score 1-3).     aspirin  EC 81 MG tablet Take 81 mg by mouth daily.     Bismuth /Metronidaz/Tetracyclin (PYLERA) 140-125-125 MG CAPS Take 3 capsules by mouth in the morning, at noon, in the evening, and at bedtime for 10 days. 120 capsule 0   Calcium  Carbonate Antacid (ANTACID PO) Take 30 mLs by mouth 4 (four) times daily as needed (heartburn, indigestion). Antacid Suspension     carvedilol  (COREG ) 12.5 MG tablet Take 12.5 mg by mouth 2 (two) times daily with a meal.     diphenhydrAMINE  (BENADRYL ) 25 MG tablet Take 25 mg by mouth every 6 (six) hours as needed for allergies or itching.     divalproex  (DEPAKOTE ) 250 MG DR tablet Take 3 tablets (750 mg total) by mouth every 12 (twelve) hours. 180 tablet 0   docusate sodium  (COLACE) 100 MG capsule Take 100 mg by mouth at bedtime.     donepezil  (ARICEPT ) 5 MG tablet Take 5 mg by mouth at bedtime.     Emollient (EUCERIN) lotion Apply 1 Application topically daily. (Apply to legs and feet)     enoxaparin  (LOVENOX ) 120  MG/0.8ML injection Inject 0.88 mLs (132 mg total) into the skin daily. 30 mL 0   feeding supplement (ENSURE ENLIVE / ENSURE PLUS) LIQD Take 237 mLs by mouth 2 (two) times daily between meals. 237 mL 12   fluocinolone 0.01 % cream Apply 1 application  topically 3 (three) times daily as needed (bug bites/rash).     guaiFENesin  (ROBITUSSIN) 100 MG/5ML liquid Take 10 mLs by mouth every 6 (six) hours as needed for cough or to loosen phlegm.     levETIRAcetam  (KEPPRA ) 1000 MG tablet Take 1,000 mg by mouth 2 (two) times daily.     loperamide  (IMODIUM ) 2 MG capsule Take 1 capsule (2 mg total) by mouth as needed for diarrhea or loose stools. (Patient taking differently: Take 2 mg by mouth as needed for diarrhea or loose stools. (Max 8 doses in 24 hours)) 30 capsule 0   losartan  (COZAAR ) 50 MG tablet Take 50 mg by mouth daily.     magnesium  hydroxide (MILK OF MAGNESIA) 400 MG/5ML suspension Take 30 mLs by mouth at bedtime as needed for mild constipation.     NIFEdipine  (PROCARDIA  XL/NIFEDICAL XL) 60 MG 24 hr tablet Take 60 mg by mouth daily.     omeprazole  (PRILOSEC) 20 MG capsule Take 1 capsule (20 mg total) by mouth 2 (two) times daily before a meal for 14 days. Take 30 min before breakfast and 30 min before dinner 28 capsule 0   rosuvastatin  (CRESTOR ) 20 MG tablet Take  20 mg by mouth at bedtime.      SYMBICORT 160-4.5 MCG/ACT inhaler Inhale 2 puffs into the lungs.     torsemide  (DEMADEX ) 20 MG tablet Take 20 mg by mouth daily.     Vitamin D, Ergocalciferol, (DRISDOL) 1.25 MG (50000 UNIT) CAPS capsule Take 50,000 Units by mouth every Monday.     warfarin (COUMADIN ) 7.5 MG tablet Take 7.5 mg by mouth every evening.     No current facility-administered medications for this visit.    Allergies   Allergies as of 04/26/2023 - Review Complete 03/24/2023  Allergen Reaction Noted   Penicillins Hives 11/20/2015     Past Medical History   Past Medical History:  Diagnosis Date   CHF (congestive heart  failure) (HCC)    CKD (chronic kidney disease) stage 3, GFR 30-59 ml/min (HCC)    Edema    Hyperlipemia    Hypertension    Seizures (HCC)     Past Surgical History   Past Surgical History:  Procedure Laterality Date   COLONOSCOPY N/A 03/24/2023   Procedure: COLONOSCOPY;  Surgeon: Hargis Lias, MD;  Location: [facility];  Service: Endoscopy;  Laterality: N/A;   ESOPHAGOGASTRODUODENOSCOPY N/A 03/24/2023   Procedure: EGD (ESOPHAGOGASTRODUODENOSCOPY);  Surgeon: Hargis Lias, MD;  Location: [facility];  Service: Endoscopy;  Laterality: N/A;   NO PAST SURGERIES      Past Family History   Family History  Problem Relation Age of Onset   Colon cancer Neg Hx    Liver disease Neg Hx     Past Social History   Social History   Socioeconomic History   Marital status: Single    Spouse name: Not on file   Number of children: Not on file   Years of education: Not on file   Highest education level: Not on file  Occupational History   Not on file  Tobacco Use   Smoking status: Never   Smokeless tobacco: Never  Vaping Use   Vaping status: Never Used  Substance and Sexual Activity   Alcohol use: Not Currently   Drug use: Never   Sexual activity: Not Currently  Other Topics Concern   Not on file  Social History Narrative   ** Merged History Encounter **       Social Drivers of Health   Financial Resource Strain: Not on file  Food Insecurity: No Food Insecurity (03/23/2023)   Hunger Vital Sign    Worried About Running Out of Food in the Last Year: Never true    Ran Out of Food in the Last Year: Never true  Transportation Needs: No Transportation Needs (03/23/2023)   PRAPARE - Administrator, Civil Service (Medical): No    Lack of Transportation (Non-Medical): No  Physical Activity: Not on file  Stress: Not on file  Social Connections: Socially Isolated (03/23/2023)   Social Connection and Isolation Panel [NHANES]    Frequency of Communication with  Friends and Family: More than three times a week    Frequency of Social Gatherings with Friends and Family: Never    Attends Religious Services: Never    Database administrator or Organizations: No    Attends Banker Meetings: Never    Marital Status: Never married  Intimate Partner Violence: Not At Risk (03/23/2023)   Humiliation, Afraid, Rape, and Kick questionnaire    Fear of Current or Ex-Partner: No    Emotionally Abused: No    Physically Abused: No  Sexually Abused: No    Review of Systems   General: Negative for anorexia, weight loss, fever, chills, fatigue, weakness. ENT: Negative for hoarseness, difficulty swallowing , nasal congestion. CV: Negative for chest pain, angina, palpitations, dyspnea on exertion, peripheral edema.  Respiratory: Negative for dyspnea at rest, dyspnea on exertion, cough, sputum, wheezing.  GI: See history of present illness. GU:  Negative for dysuria, hematuria, urinary incontinence, urinary frequency, nocturnal urination.  Endo: Negative for unusual weight change.     Physical Exam   There were no vitals taken for this visit.   General: Well-nourished, well-developed in no acute distress.  Eyes: No icterus. Mouth: Oropharyngeal mucosa moist and pink , no lesions erythema or exudate. Lungs: Clear to auscultation bilaterally.  Heart: Regular rate and rhythm, no murmurs rubs or gallops.  Abdomen: Bowel sounds are normal, nontender, nondistended, no hepatosplenomegaly or masses,  no abdominal bruits or hernia , no rebound or guarding.  Rectal: ***  Extremities: No lower extremity edema. No clubbing or deformities. Neuro: Alert and oriented x 4   Skin: Warm and dry, no jaundice.   Psych: Alert and cooperative, normal mood and affect.  Labs   Lab Results  Component Value Date   NA 139 03/25/2023   CL 106 03/25/2023   K 3.2 (L) 03/25/2023   CO2 29 03/25/2023   BUN 14 03/25/2023   CREATININE 1.22 03/25/2023   GFRNONAA >60  03/25/2023   CALCIUM  8.7 (L) 03/25/2023   PHOS 2.3 (L) 03/23/2023   ALBUMIN 1.8 (L) 03/25/2023   GLUCOSE 103 (H) 03/25/2023   Lab Results  Component Value Date   WBC 4.8 03/25/2023   HGB 11.4 (L) 03/25/2023   HCT 35.9 (L) 03/25/2023   MCV 87.7 03/25/2023   PLT 163 03/25/2023   Lab Results  Component Value Date   ALT 9 03/25/2023   AST 12 (L) 03/25/2023   ALKPHOS 39 03/25/2023   BILITOT 0.7 03/25/2023   Lab Results  Component Value Date   INR 1.3 (H) 03/25/2023   INR 1.5 (H) 03/24/2023   INR 2.3 (H) 03/23/2023    Imaging Studies   No results found.  Assessment       PLAN   ***   Trudie Fuse. Harles Lied, MHS, PA-C [facility]

## 2023-05-02 NOTE — Progress Notes (Deleted)
 GI Office Note    Referring Provider: Gammon, Chrystal, NP Primary Care Physician:  Gammon, Chrystal, NP  Primary Gastroenterologist:  Chief Complaint   No chief complaint on file.   History of Present Illness   Nathan Shepherd is a 68 y.o. male presenting today   male presenting today     H.pylori eradication))))))   Colonosocpy 03/2023: -perianal exam findings include protruding lesion beyond dentate line, soft. Could be rectal prolapse but biopsy obtained, bx c/w with mucosal prolapse, neg for dysplasia or malignancy -nonbleeding internal hemorrhoids -rectal prolapse -repeat colonoscopy 10 years   EGD 03/2023: -normal esophagus -gastritis, focal intestinal metaplasia, neg for dysplasia, H.pylori positive -egd in 3 years for intestinal metaplasia       Medications   Current Outpatient Medications  Medication Sig Dispense Refill   acetaminophen  (TYLENOL ) 325 MG tablet Take 650 mg by mouth every 4 (four) hours as needed for mild pain (pain score 1-3).     aspirin  EC 81 MG tablet Take 81 mg by mouth daily.     Bismuth /Metronidaz/Tetracyclin (PYLERA) 140-125-125 MG CAPS Take 3 capsules by mouth in the morning, at noon, in the evening, and at bedtime for 10 days. 120 capsule 0   Calcium  Carbonate Antacid (ANTACID PO) Take 30 mLs by mouth 4 (four) times daily as needed (heartburn, indigestion). Antacid Suspension     carvedilol  (COREG ) 12.5 MG tablet Take 12.5 mg by mouth 2 (two) times daily with a meal.     diphenhydrAMINE  (BENADRYL ) 25 MG tablet Take 25 mg by mouth every 6 (six) hours as needed for allergies or itching.     divalproex  (DEPAKOTE ) 250 MG DR tablet Take 3 tablets (750 mg total) by mouth every 12 (twelve) hours. 180 tablet 0   docusate sodium  (COLACE) 100 MG capsule Take 100 mg by mouth at bedtime.     donepezil  (ARICEPT ) 5 MG tablet Take 5 mg by mouth at bedtime.     Emollient (EUCERIN) lotion Apply 1 Application topically daily. (Apply to legs  and feet)     enoxaparin  (LOVENOX ) 120 MG/0.8ML injection Inject 0.88 mLs (132 mg total) into the skin daily. 30 mL 0   feeding supplement (ENSURE ENLIVE / ENSURE PLUS) LIQD Take 237 mLs by mouth 2 (two) times daily between meals. 237 mL 12   fluocinolone 0.01 % cream Apply 1 application  topically 3 (three) times daily as needed (bug bites/rash).     guaiFENesin  (ROBITUSSIN) 100 MG/5ML liquid Take 10 mLs by mouth every 6 (six) hours as needed for cough or to loosen phlegm.     levETIRAcetam  (KEPPRA ) 1000 MG tablet Take 1,000 mg by mouth 2 (two) times daily.     loperamide  (IMODIUM ) 2 MG capsule Take 1 capsule (2 mg total) by mouth as needed for diarrhea or loose stools. (Patient taking differently: Take 2 mg by mouth as needed for diarrhea or loose stools. (Max 8 doses in 24 hours)) 30 capsule 0   losartan  (COZAAR ) 50 MG tablet Take 50 mg by mouth daily.     magnesium  hydroxide (MILK OF MAGNESIA) 400 MG/5ML suspension Take 30 mLs by mouth at bedtime as needed for mild constipation.     NIFEdipine  (PROCARDIA  XL/NIFEDICAL XL) 60 MG 24 hr tablet Take 60 mg by mouth daily.     omeprazole  (PRILOSEC) 20 MG capsule Take 1 capsule (20 mg total) by mouth 2 (two) times daily before a meal for 14 days. Take 30 min before breakfast and 30  min before dinner 28 capsule 0   rosuvastatin  (CRESTOR ) 20 MG tablet Take 20 mg by mouth at bedtime.      SYMBICORT 160-4.5 MCG/ACT inhaler Inhale 2 puffs into the lungs.     torsemide  (DEMADEX ) 20 MG tablet Take 20 mg by mouth daily.     Vitamin D, Ergocalciferol, (DRISDOL) 1.25 MG (50000 UNIT) CAPS capsule Take 50,000 Units by mouth every Monday.     warfarin (COUMADIN ) 7.5 MG tablet Take 7.5 mg by mouth every evening.     No current facility-administered medications for this visit.    Allergies   Allergies as of 05/03/2023 - Review Complete 03/24/2023  Allergen Reaction Noted   Penicillins Hives 11/20/2015     Past Medical History   Past Medical History:   Diagnosis Date   CHF (congestive heart failure) (HCC)    CKD (chronic kidney disease) stage 3, GFR 30-59 ml/min (HCC)    Edema    Hyperlipemia    Hypertension    Seizures (HCC)     Past Surgical History   Past Surgical History:  Procedure Laterality Date   COLONOSCOPY N/A 03/24/2023   Procedure: COLONOSCOPY;  Surgeon: Hargis Lias, MD;  Location: [facility];  Service: Endoscopy;  Laterality: N/A;   ESOPHAGOGASTRODUODENOSCOPY N/A 03/24/2023   Procedure: EGD (ESOPHAGOGASTRODUODENOSCOPY);  Surgeon: Hargis Lias, MD;  Location: [facility];  Service: Endoscopy;  Laterality: N/A;   NO PAST SURGERIES      Past Family History   Family History  Problem Relation Age of Onset   Colon cancer Neg Hx    Liver disease Neg Hx     Past Social History   Social History   Socioeconomic History   Marital status: Single    Spouse name: Not on file   Number of children: Not on file   Years of education: Not on file   Highest education level: Not on file  Occupational History   Not on file  Tobacco Use   Smoking status: Never   Smokeless tobacco: Never  Vaping Use   Vaping status: Never Used  Substance and Sexual Activity   Alcohol use: Not Currently   Drug use: Never   Sexual activity: Not Currently  Other Topics Concern   Not on file  Social History Narrative   ** Merged History Encounter **       Social Drivers of Health   Financial Resource Strain: Not on file  Food Insecurity: No Food Insecurity (03/23/2023)   Hunger Vital Sign    Worried About Running Out of Food in the Last Year: Never true    Ran Out of Food in the Last Year: Never true  Transportation Needs: No Transportation Needs (03/23/2023)   PRAPARE - Administrator, Civil Service (Medical): No    Lack of Transportation (Non-Medical): No  Physical Activity: Not on file  Stress: Not on file  Social Connections: Socially Isolated (03/23/2023)   Social Connection and Isolation Panel  [NHANES]    Frequency of Communication with Friends and Family: More than three times a week    Frequency of Social Gatherings with Friends and Family: Never    Attends Religious Services: Never    Database administrator or Organizations: No    Attends Banker Meetings: Never    Marital Status: Never married  Intimate Partner Violence: Not At Risk (03/23/2023)   Humiliation, Afraid, Rape, and Kick questionnaire    Fear of Current or Ex-Partner:  No    Emotionally Abused: No    Physically Abused: No    Sexually Abused: No    Review of Systems   General: Negative for anorexia, weight loss, fever, chills, fatigue, weakness. ENT: Negative for hoarseness, difficulty swallowing , nasal congestion. CV: Negative for chest pain, angina, palpitations, dyspnea on exertion, peripheral edema.  Respiratory: Negative for dyspnea at rest, dyspnea on exertion, cough, sputum, wheezing.  GI: See history of present illness. GU:  Negative for dysuria, hematuria, urinary incontinence, urinary frequency, nocturnal urination.  Endo: Negative for unusual weight change.     Physical Exam   There were no vitals taken for this visit.   General: Well-nourished, well-developed in no acute distress.  Eyes: No icterus. Mouth: Oropharyngeal mucosa moist and pink , no lesions erythema or exudate. Lungs: Clear to auscultation bilaterally.  Heart: Regular rate and rhythm, no murmurs rubs or gallops.  Abdomen: Bowel sounds are normal, nontender, nondistended, no hepatosplenomegaly or masses,  no abdominal bruits or hernia , no rebound or guarding.  Rectal: ***  Extremities: No lower extremity edema. No clubbing or deformities. Neuro: Alert and oriented x 4   Skin: Warm and dry, no jaundice.   Psych: Alert and cooperative, normal mood and affect.  Labs   *** Imaging Studies   No results found.  Assessment       PLAN   ***   Trudie Fuse. Harles Lied, MHS, PA-C [facility]

## 2023-05-03 ENCOUNTER — Ambulatory Visit: Admitting: Gastroenterology

## 2023-05-26 ENCOUNTER — Ambulatory Visit (INDEPENDENT_AMBULATORY_CARE_PROVIDER_SITE_OTHER): Payer: Medicare Other | Admitting: Podiatry

## 2023-05-26 DIAGNOSIS — Z91199 Patient's noncompliance with other medical treatment and regimen due to unspecified reason: Secondary | ICD-10-CM

## 2023-05-26 NOTE — Progress Notes (Signed)
 1. No-show for appointment

## 2023-07-25 ENCOUNTER — Other Ambulatory Visit: Payer: Self-pay

## 2023-07-25 ENCOUNTER — Emergency Department (HOSPITAL_COMMUNITY)
Admission: EM | Admit: 2023-07-25 | Discharge: 2023-07-26 | Disposition: A | Discharge status: Nursing Home (Includes ICF) | Source: Ambulatory Visit | Attending: Emergency Medicine | Admitting: Emergency Medicine

## 2023-07-25 DIAGNOSIS — I130 Hypertensive heart and chronic kidney disease with heart failure and stage 1 through stage 4 chronic kidney disease, or unspecified chronic kidney disease: Secondary | ICD-10-CM | POA: Insufficient documentation

## 2023-07-25 DIAGNOSIS — R310 Gross hematuria: Secondary | ICD-10-CM

## 2023-07-25 DIAGNOSIS — Z79899 Other long term (current) drug therapy: Secondary | ICD-10-CM | POA: Insufficient documentation

## 2023-07-25 DIAGNOSIS — R791 Abnormal coagulation profile: Secondary | ICD-10-CM | POA: Insufficient documentation

## 2023-07-25 DIAGNOSIS — K922 Gastrointestinal hemorrhage, unspecified: Secondary | ICD-10-CM | POA: Insufficient documentation

## 2023-07-25 DIAGNOSIS — N183 Chronic kidney disease, stage 3 unspecified: Secondary | ICD-10-CM | POA: Insufficient documentation

## 2023-07-25 DIAGNOSIS — R319 Hematuria, unspecified: Secondary | ICD-10-CM | POA: Diagnosis present

## 2023-07-25 DIAGNOSIS — N3001 Acute cystitis with hematuria: Secondary | ICD-10-CM | POA: Diagnosis not present

## 2023-07-25 DIAGNOSIS — I509 Heart failure, unspecified: Secondary | ICD-10-CM | POA: Insufficient documentation

## 2023-07-25 DIAGNOSIS — Z7982 Long term (current) use of aspirin: Secondary | ICD-10-CM | POA: Insufficient documentation

## 2023-07-25 DIAGNOSIS — R531 Weakness: Secondary | ICD-10-CM | POA: Diagnosis not present

## 2023-07-25 LAB — CBC
HCT: 43.1 % (ref 39.0–52.0)
Hemoglobin: 14 g/dL (ref 13.0–17.0)
MCH: 28.1 pg (ref 26.0–34.0)
MCHC: 32.5 g/dL (ref 30.0–36.0)
MCV: 86.4 fL (ref 80.0–100.0)
Platelets: 146 K/uL — ABNORMAL LOW (ref 150–400)
RBC: 4.99 MIL/uL (ref 4.22–5.81)
RDW: 15.9 % — ABNORMAL HIGH (ref 11.5–15.5)
WBC: 12.3 K/uL — ABNORMAL HIGH (ref 4.0–10.5)
nRBC: 0 % (ref 0.0–0.2)

## 2023-07-25 LAB — COMPREHENSIVE METABOLIC PANEL WITH GFR
ALT: 17 U/L (ref 0–44)
AST: 22 U/L (ref 15–41)
Albumin: 3.6 g/dL (ref 3.5–5.0)
Alkaline Phosphatase: 65 U/L (ref 38–126)
Anion gap: 14 (ref 5–15)
BUN: 22 mg/dL (ref 8–23)
CO2: 28 mmol/L (ref 22–32)
Calcium: 9.8 mg/dL (ref 8.9–10.3)
Chloride: 96 mmol/L — ABNORMAL LOW (ref 98–111)
Creatinine, Ser: 1.68 mg/dL — ABNORMAL HIGH (ref 0.61–1.24)
GFR, Estimated: 45 mL/min — ABNORMAL LOW (ref >60)
Glucose, Bld: 116 mg/dL — ABNORMAL HIGH (ref 70–99)
Potassium: 3.4 mmol/L — ABNORMAL LOW (ref 3.5–5.1)
Sodium: 138 mmol/L (ref 135–145)
Total Bilirubin: 0.8 mg/dL (ref 0.0–1.2)
Total Protein: 8 g/dL (ref 6.5–8.1)

## 2023-07-25 LAB — URINALYSIS, MICROSCOPIC (REFLEX): RBC / HPF: >50 RBC/hpf (ref 0–5)

## 2023-07-25 LAB — URINALYSIS, ROUTINE W REFLEX MICROSCOPIC

## 2023-07-25 LAB — PROTIME-INR
INR: 4 — ABNORMAL HIGH (ref 0.8–1.2)
Prothrombin Time: 40.4 s — ABNORMAL HIGH (ref 11.4–15.2)

## 2023-07-25 MED ORDER — PHYTONADIONE 5 MG PO TABS
2.5000 mg | ORAL_TABLET | Freq: Once | ORAL | Status: AC
  Administered 2023-07-25: 2.5 mg via ORAL
  Filled 2023-07-25: qty 1

## 2023-07-25 NOTE — ED Triage Notes (Signed)
 Pt arrived to ED via [facility]. EMS was called out due to rectal bleeding per group home. EMS stated they only observed minor blood on a tissue. Bowel sounds present with no abd tenderness. PT denies N/V/D. Pt denies any pain. Pt A&O X 4.

## 2023-07-25 NOTE — Discharge Instructions (Addendum)
 There was some blood coming from your urine.  Follow-up with general surgery as previously discussed.  There was also some blood in the urine.  You will be notified if it does show an infection.  You have been given some vitamin K  to help thicken the blood here.  Skip today's dose but take the warfarin again tomorrow.  Follow-up with your doctor short-term for dosing of the Coumadin .

## 2023-07-25 NOTE — ED Notes (Signed)
 Attempted to call an alternate number on pt's form from facility. [phone]. No answer at this time

## 2023-07-25 NOTE — ED Provider Notes (Signed)
 [facility] EMERGENCY DEPARTMENT AT [facility] Provider Note   CSN: [id number] Arrival date & time: 07/25/23  [id number]     Patient presents with: No chief complaint on file.   Nathan Shepherd is a 68 y.o. male.   HPI Patient presents from group home.  Reportedly had blood on the tissue after going to the bathroom.  Patient without complaints.  States he did have some blood.  Is on warfarin for A-fib.   Past Medical History:  Diagnosis Date   CHF (congestive heart failure) (HCC)    CKD (chronic kidney disease) stage 3, GFR 30-59 ml/min (HCC)    Edema    Hyperlipemia    Hypertension    Seizures (HCC)     Prior to Admission medications   Medication Sig Start Date End Date Taking? Authorizing Provider  acetaminophen  (TYLENOL ) 325 MG tablet Take 650 mg by mouth every 4 (four) hours as needed for mild pain (pain score 1-3).    [provider]  amLODipine  (NORVASC ) 2.5 MG tablet Take 2.5 mg by mouth daily. 07/12/23   [provider]  aspirin  EC 81 MG tablet Take 81 mg by mouth daily.    [provider]  Bismuth /Metronidaz/Tetracyclin (PYLERA) 140-125-125 MG CAPS Take 3 capsules by mouth in the morning, at noon, in the evening, and at bedtime for 10 days. 03/29/23 04/08/23  Shepherd, Nathan F, MD  Calcium  Carbonate Antacid (ANTACID PO) Take 30 mLs by mouth 4 (four) times daily as needed (heartburn, indigestion). Antacid Suspension    [provider]  carvedilol  (COREG ) 12.5 MG tablet Take 12.5 mg by mouth 2 (two) times daily with a meal.    [provider]  diphenhydrAMINE  (BENADRYL ) 25 MG tablet Take 25 mg by mouth every 6 (six) hours as needed for allergies or itching.    [provider]  divalproex  (DEPAKOTE ) 250 MG DR tablet Take 3 tablets (750 mg total) by mouth every 12 (twelve) hours. 10/24/22 03/23/23  Nathan Calvin NOVAK, MD  docusate sodium  (COLACE) 100 MG capsule Take 100 mg by mouth at bedtime.    [provider]  donepezil  (ARICEPT ) 5 MG tablet Take 5 mg by mouth at bedtime.    [provider]  Emollient (EUCERIN) lotion Apply 1 Application topically daily. (Apply to legs and feet)    [provider]  enoxaparin  (LOVENOX ) 150 MG/ML injection Inject 150 mg into the skin daily. 03/25/23   [provider]  feeding supplement (ENSURE ENLIVE / ENSURE PLUS) LIQD Take 237 mLs by mouth 2 (two) times daily between meals. 03/25/23   Maree, Pratik D, DO  fluocinolone 0.01 % cream Apply 1 application  topically 3 (three) times daily as needed (bug bites/rash).    [provider]  guaiFENesin  (ROBITUSSIN) 100 MG/5ML liquid Take 10 mLs by mouth every 6 (six) hours as needed for cough or to loosen phlegm.    [provider]  levETIRAcetam  (KEPPRA ) 1000 MG tablet Take 1,000 mg by mouth 2 (two) times daily.    [provider]  loperamide  (IMODIUM ) 2 MG capsule Take 1 capsule (2 mg total) by mouth as needed for diarrhea or loose stools. Patient taking differently: Take 2 mg by mouth as needed for diarrhea or loose stools. (Max 8 doses in 24 hours) 05/17/22   Nathan Reyes DASEN, MD  losartan  (COZAAR ) 50 MG tablet Take 50 mg by mouth daily.    [provider]  magnesium  hydroxide (MILK OF MAGNESIA) 400 MG/5ML  suspension Take 30 mLs by mouth at bedtime as needed for mild constipation.    [provider]  NIFEdipine  (PROCARDIA  XL/NIFEDICAL XL) 60 MG 24 hr tablet Take 60 mg by mouth daily.    [provider]  omeprazole  (PRILOSEC) 20 MG capsule Take 1 capsule (20 mg total) by mouth 2 (two) times daily before a meal for 14 days. Take 30 min before breakfast and 30 min before dinner 03/29/23 04/12/23  Shepherd, Nathan FALCON, MD  rosuvastatin  (CRESTOR ) 20 MG tablet Take 20 mg by mouth at bedtime.     [provider]  SYMBICORT 160-4.5 MCG/ACT inhaler Inhale 2 puffs into the lungs. 02/23/23   [provider]  torsemide  (DEMADEX ) 20 MG  tablet Take 20 mg by mouth daily.    [provider]  Vitamin D, Ergocalciferol, (DRISDOL) 1.25 MG (50000 UNIT) CAPS capsule Take 50,000 Units by mouth every Monday.    [provider]  warfarin (COUMADIN ) 7.5 MG tablet Take 7.5 mg by mouth every evening. 02/23/23   [provider]    Allergies: Penicillins    Review of Systems  Updated Vital Signs BP (!) 165/97   Pulse 66   Temp 99.4 F (37.4 C) (Oral)   Resp (!) 25   Ht 6' 4 (1.93 m)   Wt 88.5 kg   SpO2 96%   BMI 23.74 kg/m   Physical Exam Vitals and nursing note reviewed.  Cardiovascular:     Rate and Rhythm: Normal rate.  Abdominal:     Tenderness: There is no abdominal tenderness.  Genitourinary:    Comments: Hemorrhoids and potential small rectal prolapse.  Small amount of blood. Skin:    Coloration: Skin is not pale.  Neurological:     Mental Status: He is alert. Mental status is at baseline.     (all labs ordered are listed, but only abnormal results are displayed) Labs Reviewed  PROTIME-INR - Abnormal; Notable for the following components:      Result Value   Prothrombin Time 40.4 (*)    INR 4.0 (*)    All other components within normal limits  COMPREHENSIVE METABOLIC PANEL WITH GFR - Abnormal; Notable for the following components:   Potassium 3.4 (*)    Chloride 96 (*)    Glucose, Bld 116 (*)    Creatinine, Ser 1.68 (*)    GFR, Estimated 45 (*)    All other components within normal limits  CBC - Abnormal; Notable for the following components:   WBC 12.3 (*)    RDW 15.9 (*)    Platelets 146 (*)    All other components within normal limits  URINALYSIS, ROUTINE W REFLEX MICROSCOPIC - Abnormal; Notable for the following components:   Color, Urine RED (*)    APPearance CLOUDY (*)    Glucose, UA   (*)    Value: TEST NOT REPORTED DUE TO COLOR INTERFERENCE OF URINE PIGMENT   Hgb urine dipstick   (*)    Value: TEST NOT REPORTED DUE TO COLOR INTERFERENCE OF URINE PIGMENT    Bilirubin Urine   (*)    Value: TEST NOT REPORTED DUE TO COLOR INTERFERENCE OF URINE PIGMENT   Ketones, ur   (*)    Value: TEST NOT REPORTED DUE TO COLOR INTERFERENCE OF URINE PIGMENT   Protein, ur   (*)    Value: TEST NOT REPORTED DUE TO COLOR INTERFERENCE OF URINE PIGMENT   Nitrite   (*)    Value: TEST NOT REPORTED  DUE TO COLOR INTERFERENCE OF URINE PIGMENT   Leukocytes,Ua   (*)    Value: TEST NOT REPORTED DUE TO COLOR INTERFERENCE OF URINE PIGMENT   All other components within normal limits  URINALYSIS, MICROSCOPIC (REFLEX) - Abnormal; Notable for the following components:   Bacteria, UA RARE (*)    All other components within normal limits  URINE CULTURE    EKG: None  Radiology: No results found.   Procedures   Medications Ordered in the ED  phytonadione  (VITAMIN K ) tablet 2.5 mg (has no administration in time range)                                    Medical Decision Making Amount and/or Complexity of Data Reviewed Labs: ordered.  Risk Prescription drug management.   Patient reported GI bleed.  Reviewed admission from a few months ago with GI bleed.  Did have mildly prolapsed rectum that thought to be the bleeding source.  Had endoscopies without clear cause besides that.  Also was reportedly somewhat coagulopathic from elevated INR.  Vitals reassuring.  Will check Hemoccult and rectal exam.  Will get basic blood work.  Hemoglobin reassuring.  INR is elevated at 4.  However with bleeding both from the urine and potentially from the small prolapse it is worth mildly reversing him.  Will need follow-up with PCP short-term.  Urine drug show hematuria.  Does have some bacteria.  Culture sent and if positive I would treat.     Final diagnoses:  Gastrointestinal hemorrhage, unspecified gastrointestinal hemorrhage type  Elevated INR  Gross hematuria    ED Discharge Orders     None          Patsey Lot, MD 07/25/23 [phone]

## 2023-07-25 NOTE — ED Notes (Signed)
 Called for [facility] to go by facility to have them call us . Collyns Mcquigg

## 2023-07-25 NOTE — ED Notes (Signed)
 Pt given [place] sandwich, ice water, and crackers to eat

## 2023-07-26 ENCOUNTER — Encounter (HOSPITAL_COMMUNITY): Payer: Self-pay

## 2023-07-26 ENCOUNTER — Emergency Department (HOSPITAL_COMMUNITY)
Admission: EM | Admit: 2023-07-26 | Discharge: 2023-07-26 | Disposition: A | Discharge status: Home / Self Care | Source: Skilled Nursing Facility | Attending: Emergency Medicine | Admitting: Emergency Medicine

## 2023-07-26 ENCOUNTER — Other Ambulatory Visit: Payer: Self-pay

## 2023-07-26 DIAGNOSIS — N3001 Acute cystitis with hematuria: Secondary | ICD-10-CM | POA: Diagnosis not present

## 2023-07-26 DIAGNOSIS — R531 Weakness: Secondary | ICD-10-CM | POA: Diagnosis present

## 2023-07-26 DIAGNOSIS — I10 Essential (primary) hypertension: Secondary | ICD-10-CM | POA: Diagnosis not present

## 2023-07-26 DIAGNOSIS — Z7982 Long term (current) use of aspirin: Secondary | ICD-10-CM | POA: Insufficient documentation

## 2023-07-26 DIAGNOSIS — Z79899 Other long term (current) drug therapy: Secondary | ICD-10-CM | POA: Insufficient documentation

## 2023-07-26 DIAGNOSIS — Z7901 Long term (current) use of anticoagulants: Secondary | ICD-10-CM | POA: Diagnosis not present

## 2023-07-26 DIAGNOSIS — W19XXXA Unspecified fall, initial encounter: Secondary | ICD-10-CM | POA: Diagnosis not present

## 2023-07-26 MED ORDER — CEPHALEXIN 500 MG PO CAPS
500.0000 mg | ORAL_CAPSULE | Freq: Once | ORAL | Status: AC
  Administered 2023-07-26: 500 mg via ORAL
  Filled 2023-07-26: qty 1

## 2023-07-26 MED ORDER — CEPHALEXIN 500 MG PO CAPS: 500.0000 mg | ORAL_CAPSULE | Freq: Three times a day (TID) | ORAL | 0 refills | Status: AC

## 2023-07-26 NOTE — ED Notes (Signed)
Pt tolerated ambulating  

## 2023-07-26 NOTE — ED Notes (Signed)
 Still unable to reach someone at [facility].  [facility] for transport

## 2023-07-26 NOTE — ED Notes (Signed)
 EDP made aware pt has a temp of 101

## 2023-07-26 NOTE — ED Provider Notes (Signed)
 [facility] AT [facility] Provider Note   CSN: [id number] Arrival date & time: 07/26/23  1256     Patient presents with: Weakness and Fall   Nathan Shepherd is a 68 y.o. male with a history including atrial fibrillation on Coumadin , seizure disorder, hypertension, history of CVA who was seen here yesterday and diagnosed with a UTI started on Keflex  presenting today for evaluation of a fall which occurred today.  He states he was walking in his room at his assisted living facility when he fell as he was trying to open his closet door landed on his buttocks onto hard flooring.  He denies any pain from the fall, also denies hitting his head, no headache, he was helped to his feet and was ambulatory prior to arrival.  Has no complaint of pain or symptoms at this time.  He states he missed lunch because of this fall and transport, is most interested in eating at this time.  No treatment prior to arrival.   The history is provided by the patient and medical records.  Fall Pertinent negatives include no chest pain, no abdominal pain, no headaches and no shortness of breath.       Prior to Admission medications   Medication Sig Start Date End Date Taking? Authorizing Provider  acetaminophen  (TYLENOL ) 325 MG tablet Take 650 mg by mouth every 4 (four) hours as needed for mild pain (pain score 1-3).    [provider]  amLODipine  (NORVASC ) 2.5 MG tablet Take 2.5 mg by mouth daily. 07/12/23   [provider]  aspirin  EC 81 MG tablet Take 81 mg by mouth daily.    [provider]  Bismuth /Metronidaz/Tetracyclin (PYLERA) 140-125-125 MG CAPS Take 3 capsules by mouth in the morning, at noon, in the evening, and at bedtime for 10 days. 03/29/23 04/08/23  Ahmed, [name], MD  Calcium  Carbonate Antacid (ANTACID PO) Take 30 mLs by mouth 4 (four) times daily as needed (heartburn, indigestion). Antacid Suspension    [provider]   carvedilol  (COREG ) 12.5 MG tablet Take 12.5 mg by mouth 2 (two) times daily with a meal.    [provider]  cephALEXin  (KEFLEX ) 500 MG capsule Take 1 capsule (500 mg total) by mouth 3 (three) times daily for 7 days. 07/26/23 08/02/23  Theadore Ozell HERO, MD  diphenhydrAMINE  (BENADRYL ) 25 MG tablet Take 25 mg by mouth every 6 (six) hours as needed for allergies or itching.    [provider]  divalproex  (DEPAKOTE ) 250 MG DR tablet Take 3 tablets (750 mg total) by mouth every 12 (twelve) hours. 10/24/22 03/23/23  Jhonny Calvin NOVAK, MD  docusate sodium  (COLACE) 100 MG capsule Take 100 mg by mouth at bedtime.    [provider]  donepezil  (ARICEPT ) 5 MG tablet Take 5 mg by mouth at bedtime.    [provider]  Emollient (EUCERIN) lotion Apply 1 Application topically daily. (Apply to legs and feet)    [provider]  enoxaparin  (LOVENOX ) 150 MG/ML injection Inject 150 mg into the skin daily. 03/25/23   [provider]  feeding supplement (ENSURE ENLIVE / ENSURE PLUS) LIQD Take 237 mLs by mouth 2 (two) times daily between meals. 03/25/23   Maree, Pratik D, DO  fluocinolone 0.01 % cream Apply 1 application  topically 3 (three) times daily as needed (bug bites/rash).    [provider]  guaiFENesin  (ROBITUSSIN) 100 MG/5ML liquid Take 10 mLs by mouth every 6 (six) hours  as needed for cough or to loosen phlegm.    [provider]  levETIRAcetam  (KEPPRA ) 1000 MG tablet Take 1,000 mg by mouth 2 (two) times daily.    [provider]  loperamide  (IMODIUM ) 2 MG capsule Take 1 capsule (2 mg total) by mouth as needed for diarrhea or loose stools. Patient taking differently: Take 2 mg by mouth as needed for diarrhea or loose stools. (Max 8 doses in 24 hours) 05/17/22   Danton Reyes DASEN, MD  losartan  (COZAAR ) 50 MG tablet Take 50 mg by mouth daily.    [provider]  magnesium  hydroxide (MILK OF MAGNESIA) 400 MG/5ML suspension Take  30 mLs by mouth at bedtime as needed for mild constipation.    [provider]  NIFEdipine  (PROCARDIA  XL/NIFEDICAL XL) 60 MG 24 hr tablet Take 60 mg by mouth daily.    [provider]  omeprazole  (PRILOSEC) 20 MG capsule Take 1 capsule (20 mg total) by mouth 2 (two) times daily before a meal for 14 days. Take 30 min before breakfast and 30 min before dinner 03/29/23 04/12/23  Ahmed, [name], MD  rosuvastatin  (CRESTOR ) 20 MG tablet Take 20 mg by mouth at bedtime.     [provider]  SYMBICORT 160-4.5 MCG/ACT inhaler Inhale 2 puffs into the lungs. 02/23/23   [provider]  torsemide  (DEMADEX ) 20 MG tablet Take 20 mg by mouth daily.    [provider]  Vitamin D, Ergocalciferol, (DRISDOL) 1.25 MG (50000 UNIT) CAPS capsule Take 50,000 Units by mouth every Monday.    [provider]  warfarin (COUMADIN ) 7.5 MG tablet Take 7.5 mg by mouth every evening. 02/23/23   [provider]    Allergies: Penicillins    Review of Systems  Constitutional:  Negative for chills and fever.  HENT:  Negative for congestion.   Eyes: Negative.   Respiratory:  Negative for chest tightness and shortness of breath.   Cardiovascular:  Negative for chest pain.  Gastrointestinal:  Negative for abdominal pain and nausea.  Genitourinary: Negative.   Musculoskeletal:  Negative for arthralgias, back pain, joint swelling, myalgias and neck pain.  Skin: Negative.  Negative for rash and wound.  Neurological:  Negative for dizziness, syncope, weakness, light-headedness, numbness and headaches.  Psychiatric/Behavioral: Negative.      Updated Vital Signs BP (!) 162/95 (BP Location: Right Arm)   Pulse 79   Temp 98.2 F (36.8 C) (Oral)   Resp 18   Ht 6' 4 (1.93 m)   Wt 88.5 kg   SpO2 93%   BMI 23.74 kg/m   Physical Exam Vitals and nursing note reviewed.  Constitutional:      Appearance: Normal appearance. He is well-developed.  HENT:     Head:  Normocephalic and atraumatic.     Comments: No visible signs of trauma, no scalp or head hematoma or tenderness. Eyes:     Conjunctiva/sclera: Conjunctivae normal.  Cardiovascular:     Rate and Rhythm: Normal rate and regular rhythm.     Heart sounds: Normal heart sounds.  Pulmonary:     Effort: Pulmonary effort is normal.     Breath sounds: Normal breath sounds. No wheezing.  Abdominal:     General: Bowel sounds are normal.     Palpations: Abdomen is soft.     Tenderness: There is no abdominal tenderness.  Musculoskeletal:        General: No swelling, tenderness or deformity. Normal range of motion.     Cervical back:  Normal range of motion. No tenderness.     Comments: Moves all 4 extremities without pain, is ambulatory without pain or disability.  Skin:    General: Skin is warm and dry.     Findings: No bruising or erythema.  Neurological:     General: No focal deficit present.     Mental Status: He is alert and oriented to person, place, and time.     Cranial Nerves: No cranial nerve deficit.     Sensory: Sensation is intact. No sensory deficit.     Motor: Motor function is intact. No tremor.     Comments: Equal grip strength.     (all labs ordered are listed, but only abnormal results are displayed) Labs Reviewed - No data to display  EKG: None  Radiology: No results found.   Procedures   Medications Ordered in the ED - No data to display                                  Medical Decision Making Patient presenting with a backward fall landing on his buttocks with no complaint of pain after this fall.  He was seen here yesterday where he was diagnosed with a UTI and is currently on Keflex .  He denies dizziness, also denies headache, denies any head injury with today's fall.  It is noted that an INR was checked yesterday and he was supratherapeutic at 4.0 but was treated with vitamin K .  Again no head injury, no complaints of headache or dizziness, no injury  suggesting need for CT head imaging.  He was ambulated in the department without complaint of pain.  Patient was given snacks while here, tolerated well, stable for discharge back to his assisted living facility.  Of note, attempt to contact his legal guardian Symphony Lavina -voicemail indicating she is out of the office until next Tuesday.        Final diagnoses:  Fall, initial encounter    ED Discharge Orders     None          Birdena Mliss RIGGERS 07/26/23 1606    Suzette Pac, MD 07/30/23 1134

## 2023-07-26 NOTE — ED Notes (Signed)
 Called [facility]/ no answer/ unable to leave voicemail

## 2023-07-26 NOTE — ED Notes (Signed)
 Pt changed into paper scrubs and informed that his ride is on the way. Pt verbalized understanding.

## 2023-07-26 NOTE — Discharge Instructions (Addendum)
 Your exam today is reassuring with no obvious injury from today's fall.  Make sure you complete the course of the antibiotics you were prescribed here yesterday.  Also make sure you are drinking plenty of fluids.

## 2023-07-26 NOTE — ED Triage Notes (Signed)
 Pt bib ems for generalized weakness seen yesterday and dx/ treated for UTI and given keflex . Pt reports falling today, denies head injury. AAOx4

## 2023-07-26 NOTE — ED Provider Notes (Signed)
  Provider Note MRN:  [id number]  Arrival date & time: 07/26/23    ED Course and Medical Decision Making  Assumed care of patient at sign-out or upon transfer.  Patient awaiting ride back to group home.  Here with hematuria, found to have elevated INR.  Was sleeping peacefully throughout the night, not having any complaints.  Routine repeat vitals show a fever of 101.  According to the test done during his ER visit his urinalysis does have evidence to suggest infection, even more suspicious given the new fever.  Given his well-appearing nature and otherwise normal vital signs, soft abdomen, lack of flank pain, I doubt sepsis and I do not feel patient requires admission.  He is happy to go home on antibiotics.  Strict return precautions provided.  Procedures  Final Clinical Impressions(s) / ED Diagnoses     ICD-10-CM   1. Gastrointestinal hemorrhage, unspecified gastrointestinal hemorrhage type  K92.2     2. Elevated INR  R79.1     3. Gross hematuria  R31.0     4. Acute cystitis with hematuria  N30.01       ED Discharge Orders          Ordered    cephALEXin  (KEFLEX ) 500 MG capsule  3 times daily        07/26/23 0554              Discharge Instructions      There was some blood coming from your urine.  Follow-up with general surgery as previously discussed.  There was also some blood in the urine.  You will be notified if it does show an infection.  Take the Keflex  antibiotic as prescribed to cover for infection.  You have been given some vitamin K  to help thicken the blood here.  Skip today's dose but take the warfarin again tomorrow.  Follow-up with your doctor short-term for dosing of the Coumadin .    Ozell HERO. Theadore, MD [facility] [email or web address]    Theadore Ozell HERO, MD 07/26/23 [phone]

## 2023-07-27 LAB — URINE CULTURE: Culture: >=100000 — AB

## 2023-07-28 ENCOUNTER — Telehealth (HOSPITAL_BASED_OUTPATIENT_CLINIC_OR_DEPARTMENT_OTHER): Payer: Self-pay

## 2023-07-28 NOTE — Telephone Encounter (Signed)
 Post ED Visit - Positive Culture Follow-up  Culture report reviewed by antimicrobial stewardship pharmacist: [facility] []  Rankin Dee, Pharm.D. []  Venetia Gully, Pharm.D., BCPS AQ-ID []  Garrel Crews, Pharm.D., BCPS []  Almarie Lunger, [address].D., BCPS []  [place], [address].D., BCPS, AAHIVP []  Rosaline Bihari, Pharm.D., BCPS, AAHIVP [x]  Vernell Meier, PharmD, BCPS []  Latanya Hint, PharmD, BCPS []  Donald Medley, PharmD, BCPS []  Rocky Bold, PharmD []  Dorothyann Alert, PharmD, BCPS []  Morene Babe, PharmD  [name] []  Rosaline Edison, PharmD []  Romona Bliss, PharmD []  Dolphus Roller, PharmD []  Veva Seip, Rph []  Vernell Daunt) Leonce, PharmD []  Eva Allis, PharmD []  Rosaline Millet, PharmD []  Iantha Batch, PharmD []  Arvin Gauss, PharmD []  Wanda Hasting, PharmD []  Ronal Rav, PharmD []  Rocky Slade, PharmD []  Bard Jeans, PharmD   Positive urine culture Treated with Cephalexin , organism sensitive to the same and no further patient follow-up is required at this time.  Ruth Camelia Elbe 07/28/2023, 11:40 AM

## 2023-08-28 ENCOUNTER — Ambulatory Visit (INDEPENDENT_AMBULATORY_CARE_PROVIDER_SITE_OTHER): Admitting: Podiatry

## 2023-08-28 DIAGNOSIS — Z91198 Patient's noncompliance with other medical treatment and regimen for other reason: Secondary | ICD-10-CM

## 2023-08-28 NOTE — Progress Notes (Addendum)
 1. Failure to attend appointment with reason given    Appointment rescheduled per front desk.

## 2023-09-04 ENCOUNTER — Other Ambulatory Visit: Payer: Self-pay

## 2023-09-04 ENCOUNTER — Inpatient Hospital Stay (HOSPITAL_COMMUNITY)
Admission: EM | Admit: 2023-09-04 | Discharge: 2023-09-07 | DRG: 348 | Disposition: A | Discharge status: Nursing Home (Includes ICF) | Source: Skilled Nursing Facility | Attending: Family Medicine | Admitting: Family Medicine

## 2023-09-04 DIAGNOSIS — I482 Chronic atrial fibrillation, unspecified: Secondary | ICD-10-CM | POA: Diagnosis present

## 2023-09-04 DIAGNOSIS — N1831 Chronic kidney disease, stage 3a: Secondary | ICD-10-CM | POA: Diagnosis present

## 2023-09-04 DIAGNOSIS — E876 Hypokalemia: Secondary | ICD-10-CM | POA: Diagnosis present

## 2023-09-04 DIAGNOSIS — Z888 Allergy status to other drugs, medicaments and biological substances status: Secondary | ICD-10-CM

## 2023-09-04 DIAGNOSIS — Z7982 Long term (current) use of aspirin: Secondary | ICD-10-CM

## 2023-09-04 DIAGNOSIS — K625 Hemorrhage of anus and rectum: Secondary | ICD-10-CM | POA: Diagnosis not present

## 2023-09-04 DIAGNOSIS — K643 Fourth degree hemorrhoids: Principal | ICD-10-CM | POA: Diagnosis present

## 2023-09-04 DIAGNOSIS — Z880 Allergy status to penicillin: Secondary | ICD-10-CM

## 2023-09-04 DIAGNOSIS — I130 Hypertensive heart and chronic kidney disease with heart failure and stage 1 through stage 4 chronic kidney disease, or unspecified chronic kidney disease: Secondary | ICD-10-CM | POA: Diagnosis present

## 2023-09-04 DIAGNOSIS — K648 Other hemorrhoids: Secondary | ICD-10-CM

## 2023-09-04 DIAGNOSIS — Z79899 Other long term (current) drug therapy: Secondary | ICD-10-CM

## 2023-09-04 DIAGNOSIS — Z7951 Long term (current) use of inhaled steroids: Secondary | ICD-10-CM

## 2023-09-04 DIAGNOSIS — I10 Essential (primary) hypertension: Secondary | ICD-10-CM | POA: Diagnosis present

## 2023-09-04 DIAGNOSIS — R569 Unspecified convulsions: Secondary | ICD-10-CM | POA: Diagnosis present

## 2023-09-04 DIAGNOSIS — Z7901 Long term (current) use of anticoagulants: Secondary | ICD-10-CM

## 2023-09-04 DIAGNOSIS — A048 Other specified bacterial intestinal infections: Secondary | ICD-10-CM

## 2023-09-04 DIAGNOSIS — K644 Residual hemorrhoidal skin tags: Secondary | ICD-10-CM | POA: Diagnosis present

## 2023-09-04 DIAGNOSIS — E782 Mixed hyperlipidemia: Secondary | ICD-10-CM | POA: Diagnosis present

## 2023-09-04 DIAGNOSIS — R791 Abnormal coagulation profile: Secondary | ICD-10-CM | POA: Diagnosis present

## 2023-09-04 DIAGNOSIS — D649 Anemia, unspecified: Secondary | ICD-10-CM | POA: Diagnosis present

## 2023-09-04 DIAGNOSIS — Z8673 Personal history of transient ischemic attack (TIA), and cerebral infarction without residual deficits: Secondary | ICD-10-CM

## 2023-09-04 DIAGNOSIS — I509 Heart failure, unspecified: Secondary | ICD-10-CM | POA: Diagnosis present

## 2023-09-04 DIAGNOSIS — F1910 Other psychoactive substance abuse, uncomplicated: Secondary | ICD-10-CM | POA: Diagnosis present

## 2023-09-04 DIAGNOSIS — K623 Rectal prolapse: Secondary | ICD-10-CM | POA: Diagnosis present

## 2023-09-04 LAB — CBC WITH DIFFERENTIAL/PLATELET
Abs Immature Granulocytes: 0.02 K/uL (ref 0.00–0.07)
Basophils Absolute: 0 K/uL (ref 0.0–0.1)
Basophils Relative: 1 %
Eosinophils Absolute: 0 K/uL (ref 0.0–0.5)
Eosinophils Relative: 0 %
HCT: 33.4 % — ABNORMAL LOW (ref 39.0–52.0)
Hemoglobin: 10.6 g/dL — ABNORMAL LOW (ref 13.0–17.0)
Immature Granulocytes: 0 %
Lymphocytes Relative: 17 %
Lymphs Abs: 0.9 K/uL (ref 0.7–4.0)
MCH: 28 pg (ref 26.0–34.0)
MCHC: 31.7 g/dL (ref 30.0–36.0)
MCV: 88.4 fL (ref 80.0–100.0)
Monocytes Absolute: 0.8 K/uL (ref 0.1–1.0)
Monocytes Relative: 15 %
Neutro Abs: 3.5 K/uL (ref 1.7–7.7)
Neutrophils Relative %: 67 %
Platelets: 177 K/uL (ref 150–400)
RBC: 3.78 MIL/uL — ABNORMAL LOW (ref 4.22–5.81)
RDW: 15.6 % — ABNORMAL HIGH (ref 11.5–15.5)
WBC: 5.3 K/uL (ref 4.0–10.5)
nRBC: 0 % (ref 0.0–0.2)

## 2023-09-04 LAB — TYPE AND SCREEN
ABO/RH(D): B POS
Antibody Screen: NEGATIVE

## 2023-09-04 LAB — BASIC METABOLIC PANEL WITH GFR
Anion gap: 12 (ref 5–15)
BUN: 17 mg/dL (ref 8–23)
CO2: 28 mmol/L (ref 22–32)
Calcium: 8.5 mg/dL — ABNORMAL LOW (ref 8.9–10.3)
Chloride: 102 mmol/L (ref 98–111)
Creatinine, Ser: 1.3 mg/dL — ABNORMAL HIGH (ref 0.61–1.24)
GFR, Estimated: >60 mL/min (ref >60)
Glucose, Bld: 84 mg/dL (ref 70–99)
Potassium: 3.2 mmol/L — ABNORMAL LOW (ref 3.5–5.1)
Sodium: 142 mmol/L (ref 135–145)

## 2023-09-04 LAB — PROTIME-INR
INR: 8 (ref 0.8–1.2)
Prothrombin Time: 69.7 s — ABNORMAL HIGH (ref 11.4–15.2)

## 2023-09-04 LAB — HIV ANTIBODY (ROUTINE TESTING W REFLEX): HIV Screen 4th Generation wRfx: NONREACTIVE

## 2023-09-04 MED ORDER — VITAMIN K1 10 MG/ML IJ SOLN
5.0000 mg | Freq: Once | INTRAVENOUS | Status: AC
  Administered 2023-09-04: 5 mg via INTRAVENOUS
  Filled 2023-09-04: qty 0.5

## 2023-09-04 MED ORDER — FLUTICASONE FUROATE-VILANTEROL 200-25 MCG/ACT IN AEPB
1.0000 | INHALATION_SPRAY | Freq: Every day | RESPIRATORY_TRACT | Status: DC
  Administered 2023-09-05 – 2023-09-07 (×3): 1 via RESPIRATORY_TRACT
  Filled 2023-09-04: qty 28

## 2023-09-04 MED ORDER — FLUTICASONE FUROATE-VILANTEROL 200-25 MCG/ACT IN AEPB: 1.0000 | INHALATION_SPRAY | Freq: Every day | RESPIRATORY_TRACT | Status: DC

## 2023-09-04 MED ORDER — CARVEDILOL 12.5 MG PO TABS
12.5000 mg | ORAL_TABLET | Freq: Two times a day (BID) | ORAL | Status: DC
  Administered 2023-09-04 – 2023-09-06 (×5): 12.5 mg via ORAL
  Filled 2023-09-04 (×6): qty 1

## 2023-09-04 MED ORDER — ACETAMINOPHEN 650 MG RE SUPP
650.0000 mg | Freq: Four times a day (QID) | RECTAL | Status: DC | PRN
Start: 2023-09-04 — End: 2023-09-05

## 2023-09-04 MED ORDER — LEVETIRACETAM 500 MG PO TABS
1000.0000 mg | ORAL_TABLET | Freq: Two times a day (BID) | ORAL | Status: DC
  Administered 2023-09-04 – 2023-09-07 (×6): 1000 mg via ORAL
  Filled 2023-09-04 (×6): qty 2

## 2023-09-04 MED ORDER — DONEPEZIL HCL 5 MG PO TABS
5.0000 mg | ORAL_TABLET | Freq: Every day | ORAL | Status: DC
  Administered 2023-09-04 – 2023-09-06 (×3): 5 mg via ORAL
  Filled 2023-09-04 (×3): qty 1

## 2023-09-04 MED ORDER — ENSURE ENLIVE PO LIQD
237.0000 mL | Freq: Two times a day (BID) | ORAL | Status: DC
  Administered 2023-09-04 – 2023-09-07 (×5): 237 mL via ORAL
  Filled 2023-09-04 (×9): qty 237

## 2023-09-04 MED ORDER — DIVALPROEX SODIUM 250 MG PO DR TAB
750.0000 mg | DELAYED_RELEASE_TABLET | Freq: Two times a day (BID) | ORAL | Status: DC
  Administered 2023-09-04 – 2023-09-07 (×6): 750 mg via ORAL
  Filled 2023-09-04 (×6): qty 3

## 2023-09-04 MED ORDER — NIFEDIPINE ER OSMOTIC RELEASE 30 MG PO TB24
60.0000 mg | ORAL_TABLET | Freq: Every day | ORAL | Status: DC
  Administered 2023-09-05: 60 mg via ORAL
  Filled 2023-09-04 (×3): qty 2

## 2023-09-04 MED ORDER — POTASSIUM CHLORIDE CRYS ER 20 MEQ PO TBCR
40.0000 meq | EXTENDED_RELEASE_TABLET | Freq: Once | ORAL | Status: AC
  Administered 2023-09-04: 40 meq via ORAL
  Filled 2023-09-04: qty 2

## 2023-09-04 MED ORDER — ONDANSETRON HCL 4 MG PO TABS: 4.0000 mg | ORAL_TABLET | Freq: Four times a day (QID) | ORAL | Status: DC | PRN

## 2023-09-04 MED ORDER — TORSEMIDE 20 MG PO TABS
20.0000 mg | ORAL_TABLET | Freq: Every day | ORAL | Status: DC
  Administered 2023-09-05: 20 mg via ORAL
  Filled 2023-09-04 (×3): qty 1

## 2023-09-04 MED ORDER — ACETAMINOPHEN 325 MG PO TABS: 650.0000 mg | ORAL_TABLET | Freq: Four times a day (QID) | ORAL | Status: DC | PRN

## 2023-09-04 MED ORDER — ROSUVASTATIN CALCIUM 20 MG PO TABS
20.0000 mg | ORAL_TABLET | Freq: Every day | ORAL | Status: DC
  Administered 2023-09-04 – 2023-09-06 (×3): 20 mg via ORAL
  Filled 2023-09-04 (×3): qty 1

## 2023-09-04 MED ORDER — LOSARTAN POTASSIUM 50 MG PO TABS
50.0000 mg | ORAL_TABLET | Freq: Every day | ORAL | Status: DC
  Administered 2023-09-05: 50 mg via ORAL
  Filled 2023-09-04 (×3): qty 1

## 2023-09-04 MED ORDER — ONDANSETRON HCL 4 MG/2ML IJ SOLN
4.0000 mg | Freq: Four times a day (QID) | INTRAMUSCULAR | Status: DC | PRN
  Administered 2023-09-05: 4 mg via INTRAVENOUS

## 2023-09-04 MED ORDER — PHYTONADIONE 5 MG PO TABS
2.5000 mg | ORAL_TABLET | Freq: Once | ORAL | Status: AC
  Administered 2023-09-04: 2.5 mg via ORAL
  Filled 2023-09-04: qty 1

## 2023-09-04 NOTE — ED Triage Notes (Addendum)
 Pt arrived via [facility]. EMS called out to [facility] in [place] for rectal bleeding. Pt stated that this started last night. Blood was bright in color and only starts right before having a BM. Pt couldn't tell if previous hemorrhoids had burst. VS within normal limits. Per EMS, pt is on warfarin. Upon assessment of pt's anus, protruding slightly bloody hemorrhoids noted.

## 2023-09-04 NOTE — H&P (Signed)
 History and Physical    Notnamed Scholz FMW:[id number] DOB: 06/03/1955 DOA: 09/04/2023  PCP: Gammon, Chrystal, NP   Patient coming from: Group home  Chief Complaint: Rectal bleeding  HPI: Nathan Shepherd is a 68 y.o. male with medical history significant for seizures, chronic atrial fibrillation on warfarin, polysubstance abuse, history of CVA, CKD stage IIIa, and supratherapeutic INR who presented to the ED with complaints of rectal bleeding.  He denies any abdominal pain, nausea, vomiting, diarrhea, or dark stools.  Patient has been taking his medications as otherwise prescribed.   ED Course: Patient noted to have stable vital signs and given oral dose of vitamin K .  Potassium 3.2 and INR noted to be 8 with hemoglobin 10.6.  Review of Systems: Reviewed as noted above, otherwise negative.  Past Medical History:  Diagnosis Date   CHF (congestive heart failure) (HCC)    CKD (chronic kidney disease) stage 3, GFR 30-59 ml/min (HCC)    Edema    Hyperlipemia    Hypertension    Seizures (HCC)     Past Surgical History:  Procedure Laterality Date   COLONOSCOPY N/A 03/24/2023   Procedure: COLONOSCOPY;  Surgeon: Cinderella [name], MD;  Location: [facility];  Service: Endoscopy;  Laterality: N/A;   ESOPHAGOGASTRODUODENOSCOPY N/A 03/24/2023   Procedure: EGD (ESOPHAGOGASTRODUODENOSCOPY);  Surgeon: Cinderella [name], MD;  Location: [facility];  Service: Endoscopy;  Laterality: N/A;   NO PAST SURGERIES       reports that he has never smoked. He has never used smokeless tobacco. He reports that he does not currently use alcohol. He reports that he does not use drugs.  Allergies  Allergen Reactions   Penicillins Hives    Tolerated Zosyn     Family History  Problem Relation Age of Onset   Colon cancer Neg Hx    Liver disease Neg Hx     Prior to Admission medications   Medication Sig Start Date End Date Taking? Authorizing Provider  acetaminophen  (TYLENOL ) 325 MG tablet  Take 650 mg by mouth every 4 (four) hours as needed for mild pain (pain score 1-3).    [provider]  amLODipine  (NORVASC ) 2.5 MG tablet Take 2.5 mg by mouth daily. 07/12/23   [provider]  aspirin  EC 81 MG tablet Take 81 mg by mouth daily.    [provider]  Bismuth /Metronidaz/Tetracyclin (PYLERA) 140-125-125 MG CAPS Take 3 capsules by mouth in the morning, at noon, in the evening, and at bedtime for 10 days. 03/29/23 04/08/23  Ahmed, [name], MD  Calcium  Carbonate Antacid (ANTACID PO) Take 30 mLs by mouth 4 (four) times daily as needed (heartburn, indigestion). Antacid Suspension    [provider]  carvedilol  (COREG ) 12.5 MG tablet Take 12.5 mg by mouth 2 (two) times daily with a meal.    [provider]  diphenhydrAMINE  (BENADRYL ) 25 MG tablet Take 25 mg by mouth every 6 (six) hours as needed for allergies or itching.    [provider]  divalproex  (DEPAKOTE ) 250 MG DR tablet Take 3 tablets (750 mg total) by mouth every 12 (twelve) hours. 10/24/22 03/23/23  Jhonny Calvin NOVAK, MD  docusate sodium  (COLACE) 100 MG capsule Take 100 mg by mouth at bedtime.    [provider]  donepezil  (ARICEPT ) 5 MG tablet Take 5 mg by mouth at bedtime.    [provider]  Emollient (EUCERIN) lotion Apply 1 Application topically daily. (Apply to legs and feet)    [provider]  enoxaparin  (LOVENOX ) 150 MG/ML injection Inject 150 mg into the skin daily. 03/25/23   [provider]  feeding supplement (ENSURE ENLIVE / ENSURE PLUS) LIQD Take 237 mLs by mouth 2 (two) times daily between meals. 03/25/23   Maree, Ellee Wawrzyniak D, DO  fluocinolone 0.01 % cream Apply 1 application  topically 3 (three) times daily as needed (bug bites/rash).    [provider]  guaiFENesin  (ROBITUSSIN) 100 MG/5ML liquid Take 10 mLs by mouth every 6 (six) hours as needed for cough or to loosen phlegm.    [provider]  levETIRAcetam   (KEPPRA ) 1000 MG tablet Take 1,000 mg by mouth 2 (two) times daily.    [provider]  loperamide  (IMODIUM ) 2 MG capsule Take 1 capsule (2 mg total) by mouth as needed for diarrhea or loose stools. Patient taking differently: Take 2 mg by mouth as needed for diarrhea or loose stools. (Max 8 doses in 24 hours) 05/17/22   Danton Reyes DASEN, MD  losartan  (COZAAR ) 50 MG tablet Take 50 mg by mouth daily.    [provider]  magnesium  hydroxide (MILK OF MAGNESIA) 400 MG/5ML suspension Take 30 mLs by mouth at bedtime as needed for mild constipation.    [provider]  NIFEdipine  (PROCARDIA  XL/NIFEDICAL XL) 60 MG 24 hr tablet Take 60 mg by mouth daily.    [provider]  omeprazole  (PRILOSEC) 20 MG capsule Take 1 capsule (20 mg total) by mouth 2 (two) times daily before a meal for 14 days. Take 30 min before breakfast and 30 min before dinner 03/29/23 04/12/23  Ahmed, [name], MD  rosuvastatin  (CRESTOR ) 20 MG tablet Take 20 mg by mouth at bedtime.     [provider]  SYMBICORT  160-4.5 MCG/ACT inhaler Inhale 2 puffs into the lungs. 02/23/23   [provider]  torsemide  (DEMADEX ) 20 MG tablet Take 20 mg by mouth daily.    [provider]  Vitamin D , Ergocalciferol , (DRISDOL ) 1.25 MG (50000 UNIT) CAPS capsule Take 50,000 Units by mouth every Monday.    [provider]  warfarin (COUMADIN ) 7.5 MG tablet Take 7.5 mg by mouth every evening. 02/23/23   [provider]    Physical Exam: Vitals:   09/04/23 0857 09/04/23 0900 09/04/23 1158  BP:  (!) 152/115 (!) 122/98  Pulse:  69 78  Resp:  16 18  Temp:  98 F (36.7 C) 98.5 F (36.9 C)  TempSrc:  Oral Oral  SpO2:  96% 97%  Weight: 88.5 kg  80.1 kg  Height: 6' 4 (1.93 m)  6' 4 (1.93 m)    Constitutional: NAD, calm, comfortable Vitals:   09/04/23 0857 09/04/23 0900 09/04/23 1158  BP:  (!) 152/115 (!) 122/98  Pulse:  69 78  Resp:  16 18  Temp:  98 F (36.7 C) 98.5 F  (36.9 C)  TempSrc:  Oral Oral  SpO2:  96% 97%  Weight: 88.5 kg  80.1 kg  Height: 6' 4 (1.93 m)  6' 4 (1.93 m)   Eyes: lids and conjunctivae normal Neck: normal, supple Respiratory: clear to auscultation bilaterally. Normal respiratory effort. No accessory muscle use.  Cardiovascular: Regular rate and rhythm, no murmurs. Abdomen: no tenderness, no distention. Bowel sounds positive.  Musculoskeletal:  No edema. Skin: no rashes, lesions, ulcers.  Psychiatric: Flat affect  Labs on Admission: I have personally reviewed following labs and imaging studies  CBC: Recent Labs  Lab 09/04/23 0910  WBC 5.3  NEUTROABS 3.5  HGB 10.6*  HCT 33.4*  MCV 88.4  PLT 177   Basic Metabolic Panel: Recent Labs  Lab 09/04/23 0910  NA 142  K 3.2*  CL 102  CO2 28  GLUCOSE 84  BUN 17  CREATININE 1.30*  CALCIUM  8.5*   GFR: Estimated Creatinine Clearance: 64.2 mL/min (A) (by C-G formula based on SCr of 1.3 mg/dL (H)). Liver Function Tests: No results for input(s): AST, ALT, ALKPHOS, BILITOT, PROT, ALBUMIN in the last 168 hours. No results for input(s): LIPASE, AMYLASE in the last 168 hours. No results for input(s): AMMONIA in the last 168 hours. Coagulation Profile: Recent Labs  Lab 09/04/23 0910  INR 8.0*   Cardiac Enzymes: No results for input(s): CKTOTAL, CKMB, CKMBINDEX, TROPONINI in the last 168 hours. BNP (last 3 results) No results for input(s): PROBNP in the last 8760 hours. HbA1C: No results for input(s): HGBA1C in the last 72 hours. CBG: No results for input(s): GLUCAP in the last 168 hours. Lipid Profile: No results for input(s): CHOL, HDL, LDLCALC, TRIG, CHOLHDL, LDLDIRECT in the last 72 hours. Thyroid Function Tests: No results for input(s): TSH, T4TOTAL, FREET4, T3FREE, THYROIDAB in the last 72 hours. Anemia Panel: No results for input(s): VITAMINB12, FOLATE, FERRITIN, TIBC, IRON, RETICCTPCT in the  last 72 hours. Urine analysis:    Component Value Date/Time   COLORURINE RED (A) 07/25/2023 1717   APPEARANCEUR CLOUDY (A) 07/25/2023 1717   LABSPEC  07/25/2023 1717    TEST NOT REPORTED DUE TO COLOR INTERFERENCE OF URINE PIGMENT   PHURINE  07/25/2023 1717    TEST NOT REPORTED DUE TO COLOR INTERFERENCE OF URINE PIGMENT   GLUCOSEU (A) 07/25/2023 1717    TEST NOT REPORTED DUE TO COLOR INTERFERENCE OF URINE PIGMENT   HGBUR (A) 07/25/2023 1717    TEST NOT REPORTED DUE TO COLOR INTERFERENCE OF URINE PIGMENT   BILIRUBINUR (A) 07/25/2023 1717    TEST NOT REPORTED DUE TO COLOR INTERFERENCE OF URINE PIGMENT   KETONESUR (A) 07/25/2023 1717    TEST NOT REPORTED DUE TO COLOR INTERFERENCE OF URINE PIGMENT   PROTEINUR (A) 07/25/2023 1717    TEST NOT REPORTED DUE TO COLOR INTERFERENCE OF URINE PIGMENT   NITRITE (A) 07/25/2023 1717    TEST NOT REPORTED DUE TO COLOR INTERFERENCE OF URINE PIGMENT   LEUKOCYTESUR (A) 07/25/2023 1717    TEST NOT REPORTED DUE TO COLOR INTERFERENCE OF URINE PIGMENT    Radiological Exams on Admission: No results found.  Assessment/Plan Principal Problem:   Rectal bleeding Active Problems:   Atrial fibrillation, chronic (HCC) - cannot be on DOAC due to concurrent treatment with Depakote .   Essential hypertension   Seizure (HCC) - cannot be on DOAC due to concurrent treatment with Depakote .   Mixed hyperlipidemia   History of CVA (cerebrovascular accident) - not on DOAC due to interaction with AEDs   Anemia   Elevated INR   Rectal prolapse    Rectal bleeding from hemorrhoids secondary to supratherapeutic INR - Noted to be on Coumadin  for atrial fibrillation and cannot use DOAC due to Depakote  use - Case to be discussed with general surgery to consider hemorrhoidectomy while hospitalized - Continue to monitor hemoglobin and hematocrit levels and transfuse for hemoglobin less than 7 - Plan to give IV vitamin K   Mild hypokalemia - Replete and  reevaluate  Supratherapeutic INR -Given oral vitamin K  in ED - Plan to give dose of IV vitamin K  - Will likely need Lovenox  bridging in the outpatient setting to  get Coumadin  therapeutic  Essential hypertension - Continue amlodipine , Coreg , losartan   Chronic atrial fibrillation - Cannot be on DOAC due to concurrent treatment with Depakote  - Continue Coreg   History of CVA/dyslipidemia - Crestor  and continue anticoagulation outpatient  Seizure - Keppra  1000 mg twice daily and Depakote    DVT prophylaxis: SCDs Code Status: Full Family Communication: Tried calling legal guardian with no response 9/1 Disposition Plan: Observe for further rectal bleeding Consults called: Will discuss with general surgery Admission status: Observation, MedSurg  Severity of Illness: The appropriate patient status for this patient is OBSERVATION. Observation status is judged to be reasonable and necessary in order to provide the required intensity of service to ensure the patient's safety. The patient's presenting symptoms, physical exam findings, and initial radiographic and laboratory data in the context of their medical condition is felt to place them at decreased risk for further clinical deterioration. Furthermore, it is anticipated that the patient will be medically stable for discharge from the hospital within 2 midnights of admission.    Chaeli Judy D Maree DO [facility]  If 7PM-7AM, please contact night-coverage [email or web address]  09/04/2023, 12:48 PM

## 2023-09-04 NOTE — Plan of Care (Signed)

## 2023-09-04 NOTE — Progress Notes (Signed)
 Pt arrived to room 327 from ED via stretcher. Pt able to ambulate from stretcher to bathroom to void and then to bed in room. Pt had one large incontinent urination in ED and then voided again large amount clear yellow urine into toilet once to room. Pt bathed, new adult pull=up and scrub pants put on pt for comfort. Pt denies any c/o at present. External hemorrhoid noted at pt's rectum when up to bathroom. No bleeding noted but pt with moderate amt of clear to white mucous noted on hemorrhoid. Area cleaned and dried, no bleeding noted. PT asking for food, states he is very hungry and also needs vanilla ice cream. Pt oriented to room and safety procedures. Call bell within reach, bed alarm on and pt advised to call for needs and to not get OOB by himself. Pt states understadning.

## 2023-09-04 NOTE — ED Provider Notes (Signed)
 [place] EMERGENCY DEPARTMENT AT [facility] Provider Note  CSN: [id number] Arrival date & time: 09/04/23 9153  Chief Complaint(s) Rectal Bleeding  HPI Nathan Shepherd is a 68 y.o. male history of CKD, CHF, hypertension, hyperlipidemia presenting with rectal bleed.  Patient reports that he has some blood before going to the bathroom.  Overall feels well.  Denies any chest pain, shortness of breath, fevers or chills, lightheadedness or dizziness, fainting, diarrhea, other new symptoms.  This occurred last night   Past Medical History Past Medical History:  Diagnosis Date   CHF (congestive heart failure) (HCC)    CKD (chronic kidney disease) stage 3, GFR 30-59 ml/min (HCC)    Edema    Hyperlipemia    Hypertension    Seizures (HCC)    Patient Active Problem List   Diagnosis Date Noted   Rectal bleeding 09/04/2023   Anemia 03/24/2023   Elevated INR 03/24/2023   Rectal prolapse 03/24/2023   Supratherapeutic INR 03/23/2023   Hypoalbuminemia due to protein-calorie malnutrition (HCC) 03/23/2023   Essential hypertension 03/23/2023   Mixed hyperlipidemia 03/23/2023   History of CVA (cerebrovascular accident) - not on DOAC due to interaction with AEDs 03/23/2023   GI bleed 03/22/2023   Seizure (HCC) - cannot be on DOAC due to concurrent treatment with Depakote . 10/22/2022   Atrial fibrillation, chronic (HCC) - cannot be on DOAC due to concurrent treatment with Depakote . 08/25/2021   Acute kidney injury superimposed on chronic kidney disease (HCC) - Baseline scr 1.2-1.6 08/25/2021   Home Medication(s) Prior to Admission medications   Medication Sig Start Date End Date Taking? Authorizing Provider  acetaminophen  (TYLENOL ) 325 MG tablet Take 650 mg by mouth every 4 (four) hours as needed for mild pain (pain score 1-3).    [provider]  amLODipine  (NORVASC ) 2.5 MG tablet Take 2.5 mg by mouth daily. 07/12/23   [provider]  aspirin  EC 81 MG tablet  Take 81 mg by mouth daily.    [provider]  Bismuth /Metronidaz/Tetracyclin (PYLERA) 140-125-125 MG CAPS Take 3 capsules by mouth in the morning, at noon, in the evening, and at bedtime for 10 days. 03/29/23 04/08/23  Ahmed, [name], MD  Calcium  Carbonate Antacid (ANTACID PO) Take 30 mLs by mouth 4 (four) times daily as needed (heartburn, indigestion). Antacid Suspension    [provider]  carvedilol  (COREG ) 12.5 MG tablet Take 12.5 mg by mouth 2 (two) times daily with a meal.    [provider]  diphenhydrAMINE  (BENADRYL ) 25 MG tablet Take 25 mg by mouth every 6 (six) hours as needed for allergies or itching.    [provider]  divalproex  (DEPAKOTE ) 250 MG DR tablet Take 3 tablets (750 mg total) by mouth every 12 (twelve) hours. 10/24/22 03/23/23  Jhonny Calvin NOVAK, MD  docusate sodium  (COLACE) 100 MG capsule Take 100 mg by mouth at bedtime.    [provider]  donepezil  (ARICEPT ) 5 MG tablet Take 5 mg by mouth at bedtime.    [provider]  Emollient (EUCERIN) lotion Apply 1 Application topically daily. (Apply to legs and feet)    [provider]  enoxaparin  (LOVENOX ) 150 MG/ML injection Inject 150 mg into the skin daily. 03/25/23   [provider]  feeding supplement (ENSURE ENLIVE / ENSURE PLUS) LIQD Take 237 mLs by mouth 2 (two) times daily between meals. 03/25/23   Maree, Pratik D, DO  fluocinolone 0.01 % cream Apply 1 application  topically 3 (three) times daily  as needed (bug bites/rash).    [provider]  guaiFENesin  (ROBITUSSIN) 100 MG/5ML liquid Take 10 mLs by mouth every 6 (six) hours as needed for cough or to loosen phlegm.    [provider]  levETIRAcetam  (KEPPRA ) 1000 MG tablet Take 1,000 mg by mouth 2 (two) times daily.    [provider]  loperamide  (IMODIUM ) 2 MG capsule Take 1 capsule (2 mg total) by mouth as needed for diarrhea or loose stools. Patient taking differently: Take 2  mg by mouth as needed for diarrhea or loose stools. (Max 8 doses in 24 hours) 05/17/22   Danton Reyes DASEN, MD  losartan  (COZAAR ) 50 MG tablet Take 50 mg by mouth daily.    [provider]  magnesium  hydroxide (MILK OF MAGNESIA) 400 MG/5ML suspension Take 30 mLs by mouth at bedtime as needed for mild constipation.    [provider]  NIFEdipine  (PROCARDIA  XL/NIFEDICAL XL) 60 MG 24 hr tablet Take 60 mg by mouth daily.    [provider]  omeprazole  (PRILOSEC) 20 MG capsule Take 1 capsule (20 mg total) by mouth 2 (two) times daily before a meal for 14 days. Take 30 min before breakfast and 30 min before dinner 03/29/23 04/12/23  Ahmed, [name], MD  rosuvastatin  (CRESTOR ) 20 MG tablet Take 20 mg by mouth at bedtime.     [provider]  SYMBICORT  160-4.5 MCG/ACT inhaler Inhale 2 puffs into the lungs. 02/23/23   [provider]  torsemide  (DEMADEX ) 20 MG tablet Take 20 mg by mouth daily.    [provider]  Vitamin D , Ergocalciferol , (DRISDOL ) 1.25 MG (50000 UNIT) CAPS capsule Take 50,000 Units by mouth every Monday.    [provider]  warfarin (COUMADIN ) 7.5 MG tablet Take 7.5 mg by mouth every evening. 02/23/23   [provider]                                                                                                                                    Past Surgical History Past Surgical History:  Procedure Laterality Date   COLONOSCOPY N/A 03/24/2023   Procedure: COLONOSCOPY;  Surgeon: Cinderella [name], MD;  Location: [facility];  Service: Endoscopy;  Laterality: N/A;   ESOPHAGOGASTRODUODENOSCOPY N/A 03/24/2023   Procedure: EGD (ESOPHAGOGASTRODUODENOSCOPY);  Surgeon: Cinderella [name], MD;  Location: [facility];  Service: Endoscopy;  Laterality: N/A;   NO PAST SURGERIES     Family History Family History  Problem Relation Age of Onset   Colon cancer Neg Hx    Liver disease Neg Hx     Social History Social  History   Tobacco Use   Smoking status: Never   Smokeless tobacco: Never  Vaping Use   Vaping status: Never Used  Substance Use Topics   Alcohol use: Not Currently   Drug use: Never   Allergies Penicillins  Review of Systems Review of Systems  All other systems reviewed and are negative.   Physical Exam Vital Signs  I have reviewed the triage vital signs BP (!) 152/115   Pulse 69   Temp 98 F (36.7 C) (Oral)   Resp 16   Ht 6' 4 (1.93 m)   Wt 88.5 kg   SpO2 96%   BMI 23.75 kg/m  Physical Exam Vitals and nursing note reviewed.  Constitutional:      General: He is not in acute distress.    Appearance: Normal appearance.  HENT:     Mouth/Throat:     Mouth: Mucous membranes are moist.  Eyes:     Conjunctiva/sclera: Conjunctivae normal.  Cardiovascular:     Rate and Rhythm: Normal rate and regular rhythm.  Pulmonary:     Effort: Pulmonary effort is normal. No respiratory distress.     Breath sounds: Normal breath sounds.  Abdominal:     General: Abdomen is flat.     Palpations: Abdomen is soft.     Tenderness: There is no abdominal tenderness.  Genitourinary:    Comments: Chaperoned by RN.  External rectal lesion with small amount of blood on surface, no active bleeding noted.  Internal rectal exam with only brown stool, no melena, no hematochezia Musculoskeletal:     Right lower leg: No edema.     Left lower leg: No edema.  Skin:    General: Skin is warm and dry.     Capillary Refill: Capillary refill takes less than 2 seconds.  Neurological:     Mental Status: He is alert and oriented to person, place, and time. Mental status is at baseline.  Psychiatric:        Mood and Affect: Mood normal.        Behavior: Behavior normal.     ED Results and Treatments Labs (all labs ordered are listed, but only abnormal results are displayed) Labs Reviewed  BASIC METABOLIC PANEL WITH GFR - Abnormal; Notable for the following components:      Result Value    Potassium 3.2 (*)    Creatinine, Ser 1.30 (*)    Calcium  8.5 (*)    All other components within normal limits  CBC WITH DIFFERENTIAL/PLATELET - Abnormal; Notable for the following components:   RBC 3.78 (*)    Hemoglobin 10.6 (*)    HCT 33.4 (*)    RDW 15.6 (*)    All other components within normal limits  PROTIME-INR - Abnormal; Notable for the following components:   Prothrombin Time 69.7 (*)    INR 8.0 (*)    All other components within normal limits  POC OCCULT BLOOD, ED - Normal  TYPE AND SCREEN                                                                                                                          Radiology No results found.  Pertinent labs & imaging results that were available during my care of the patient were  reviewed by me and considered in my medical decision making (see MDM for details).  Medications Ordered in ED Medications  phytonadione  (VITAMIN K ) tablet 2.5 mg (has no administration in time range)                                                                                                                                     Procedures Procedures  (including critical care time)  Medical Decision Making / ED Course   MDM:  68 year old presenting to the emergency department with rectal bleeding.  Patient overall very well-appearing, exam with rectal lesion with small amount of blood without active bleeding.  Rectal exam with no melena or hematochezia.  Reviewed prior hospitalization, patient was hospitalized in March with bleeding, had colonoscopy and endoscopy which did not show bleeding source, showed external rectal lesion thought to be possible prolapse which was thought to be source.  Patient does not obviously have a large rectal prolapse.  Finding could also represent some type of hemorrhoid.  Seems this is the case again today.  Patient was recommended to follow-up with Dr. [name] with general surgery but does not appear that this  happened.  Will check labs, INR.  If these are reassuring anticipate likely discharge.  Low concern for dangerous cause of bleeding.  Clinical Course as of 09/04/23 1119  Mon Sep 04, 2023  1102 Labs showed drop in hemoglobin from 14-10.6 although previous before hemoglobin 14 was lower.  Patient also supratherapeutic INR of 8.  Discussed with hospitalist given this to evaluate patient for observation [WS]  1118 Admitted by hospitalist [WS]    Clinical Course User Index [WS] Francesca Elsie CROME, MD     Additional history obtained: -Additional history obtained from ems -External records from outside source obtained and reviewed including: Chart review including previous notes, labs, imaging, consultation notes including prior notes    Lab Tests: -I ordered, reviewed, and interpreted labs.   The pertinent results include:   Labs Reviewed  BASIC METABOLIC PANEL WITH GFR - Abnormal; Notable for the following components:      Result Value   Potassium 3.2 (*)    Creatinine, Ser 1.30 (*)    Calcium  8.5 (*)    All other components within normal limits  CBC WITH DIFFERENTIAL/PLATELET - Abnormal; Notable for the following components:   RBC 3.78 (*)    Hemoglobin 10.6 (*)    HCT 33.4 (*)    RDW 15.6 (*)    All other components within normal limits  PROTIME-INR - Abnormal; Notable for the following components:   Prothrombin Time 69.7 (*)    INR 8.0 (*)    All other components within normal limits  POC OCCULT BLOOD, ED - Normal  TYPE AND SCREEN    Notable for mild hypokalemia, anemia, elevated INR       Medicines ordered and prescription drug management: Meds ordered this encounter  Medications  phytonadione  (VITAMIN K ) tablet 2.5 mg    -I have reviewed the patients home medicines and have made adjustments as needed   Consultations Obtained: I requested consultation with the hospitalist,  and discussed lab and imaging findings as well as pertinent plan - they recommend:  observation   Cardiac Monitoring: The patient was maintained on a cardiac monitor.  I personally viewed and interpreted the cardiac monitored which showed an underlying rhythm of: NSR  Reevaluation: After the interventions noted above, I reevaluated the patient and found that their symptoms have stayed the same  Co morbidities that complicate the patient evaluation  Past Medical History:  Diagnosis Date   CHF (congestive heart failure) (HCC)    CKD (chronic kidney disease) stage 3, GFR 30-59 ml/min (HCC)    Edema    Hyperlipemia    Hypertension    Seizures (HCC)       Dispostion: Disposition decision including need for hospitalization was considered, and patient admitted to the hospital.    Final Clinical Impression(s) / ED Diagnoses Final diagnoses:  Rectal bleeding  Supratherapeutic INR     This chart was dictated using voice recognition software.  Despite best efforts to proofread,  errors can occur which can change the documentation meaning.    Francesca Elsie CROME, MD 09/04/23 [phone]

## 2023-09-05 ENCOUNTER — Observation Stay (HOSPITAL_COMMUNITY): Admitting: Anesthesiology

## 2023-09-05 ENCOUNTER — Encounter (HOSPITAL_COMMUNITY): Payer: Self-pay | Admitting: Internal Medicine

## 2023-09-05 ENCOUNTER — Encounter (HOSPITAL_COMMUNITY)
Admission: EM | Disposition: A | Discharge status: Nursing Home (Includes ICF) | Payer: Self-pay | Source: Skilled Nursing Facility | Attending: Internal Medicine

## 2023-09-05 DIAGNOSIS — K648 Other hemorrhoids: Secondary | ICD-10-CM

## 2023-09-05 DIAGNOSIS — K643 Fourth degree hemorrhoids: Secondary | ICD-10-CM

## 2023-09-05 DIAGNOSIS — Z79899 Other long term (current) drug therapy: Secondary | ICD-10-CM | POA: Diagnosis not present

## 2023-09-05 DIAGNOSIS — K623 Rectal prolapse: Secondary | ICD-10-CM | POA: Diagnosis present

## 2023-09-05 DIAGNOSIS — R791 Abnormal coagulation profile: Secondary | ICD-10-CM

## 2023-09-05 DIAGNOSIS — N183 Chronic kidney disease, stage 3 unspecified: Secondary | ICD-10-CM

## 2023-09-05 DIAGNOSIS — K625 Hemorrhage of anus and rectum: Secondary | ICD-10-CM | POA: Diagnosis present

## 2023-09-05 DIAGNOSIS — N1831 Chronic kidney disease, stage 3a: Secondary | ICD-10-CM | POA: Diagnosis present

## 2023-09-05 DIAGNOSIS — I129 Hypertensive chronic kidney disease with stage 1 through stage 4 chronic kidney disease, or unspecified chronic kidney disease: Secondary | ICD-10-CM | POA: Diagnosis not present

## 2023-09-05 DIAGNOSIS — K644 Residual hemorrhoidal skin tags: Secondary | ICD-10-CM | POA: Diagnosis present

## 2023-09-05 DIAGNOSIS — Z888 Allergy status to other drugs, medicaments and biological substances status: Secondary | ICD-10-CM | POA: Diagnosis not present

## 2023-09-05 DIAGNOSIS — Z8673 Personal history of transient ischemic attack (TIA), and cerebral infarction without residual deficits: Secondary | ICD-10-CM | POA: Diagnosis not present

## 2023-09-05 DIAGNOSIS — Z7982 Long term (current) use of aspirin: Secondary | ICD-10-CM | POA: Diagnosis not present

## 2023-09-05 DIAGNOSIS — E782 Mixed hyperlipidemia: Secondary | ICD-10-CM | POA: Diagnosis present

## 2023-09-05 DIAGNOSIS — E876 Hypokalemia: Secondary | ICD-10-CM | POA: Diagnosis present

## 2023-09-05 DIAGNOSIS — D649 Anemia, unspecified: Secondary | ICD-10-CM | POA: Diagnosis present

## 2023-09-05 DIAGNOSIS — I509 Heart failure, unspecified: Secondary | ICD-10-CM | POA: Diagnosis present

## 2023-09-05 DIAGNOSIS — I482 Chronic atrial fibrillation, unspecified: Secondary | ICD-10-CM | POA: Diagnosis present

## 2023-09-05 DIAGNOSIS — Z7901 Long term (current) use of anticoagulants: Secondary | ICD-10-CM | POA: Diagnosis not present

## 2023-09-05 DIAGNOSIS — I130 Hypertensive heart and chronic kidney disease with heart failure and stage 1 through stage 4 chronic kidney disease, or unspecified chronic kidney disease: Secondary | ICD-10-CM | POA: Diagnosis present

## 2023-09-05 DIAGNOSIS — Z7951 Long term (current) use of inhaled steroids: Secondary | ICD-10-CM | POA: Diagnosis not present

## 2023-09-05 DIAGNOSIS — R569 Unspecified convulsions: Secondary | ICD-10-CM | POA: Diagnosis present

## 2023-09-05 DIAGNOSIS — Z880 Allergy status to penicillin: Secondary | ICD-10-CM | POA: Diagnosis not present

## 2023-09-05 DIAGNOSIS — F1910 Other psychoactive substance abuse, uncomplicated: Secondary | ICD-10-CM | POA: Diagnosis present

## 2023-09-05 HISTORY — PX: HEMORRHOID SURGERY: SHX153

## 2023-09-05 LAB — CBC
HCT: 32 % — ABNORMAL LOW (ref 39.0–52.0)
Hemoglobin: 10.1 g/dL — ABNORMAL LOW (ref 13.0–17.0)
MCH: 27.8 pg (ref 26.0–34.0)
MCHC: 31.6 g/dL (ref 30.0–36.0)
MCV: 88.2 fL (ref 80.0–100.0)
Platelets: 191 K/uL (ref 150–400)
RBC: 3.63 MIL/uL — ABNORMAL LOW (ref 4.22–5.81)
RDW: 15.6 % — ABNORMAL HIGH (ref 11.5–15.5)
WBC: 6.8 K/uL (ref 4.0–10.5)
nRBC: 0 % (ref 0.0–0.2)

## 2023-09-05 LAB — BASIC METABOLIC PANEL WITH GFR
Anion gap: 7 (ref 5–15)
BUN: 20 mg/dL (ref 8–23)
CO2: 32 mmol/L (ref 22–32)
Calcium: 9 mg/dL (ref 8.9–10.3)
Chloride: 102 mmol/L (ref 98–111)
Creatinine, Ser: 1.34 mg/dL — ABNORMAL HIGH (ref 0.61–1.24)
GFR, Estimated: 59 mL/min — ABNORMAL LOW (ref >60)
Glucose, Bld: 95 mg/dL (ref 70–99)
Potassium: 3.6 mmol/L (ref 3.5–5.1)
Sodium: 141 mmol/L (ref 135–145)

## 2023-09-05 LAB — PROTIME-INR
INR: 1.8 — ABNORMAL HIGH (ref 0.8–1.2)
Prothrombin Time: 22 s — ABNORMAL HIGH (ref 11.4–15.2)

## 2023-09-05 LAB — MAGNESIUM: Magnesium: 1.7 mg/dL (ref 1.7–2.4)

## 2023-09-05 SURGERY — HEMORRHOIDECTOMY
Anesthesia: General | Site: Rectum

## 2023-09-05 MED ORDER — SODIUM CHLORIDE 0.9 % IR SOLN
Status: DC | PRN
  Administered 2023-09-05: 500 mL

## 2023-09-05 MED ORDER — LIDOCAINE VISCOUS HCL 2 % MT SOLN
OROMUCOSAL | Status: DC | PRN
  Administered 2023-09-05: 1

## 2023-09-05 MED ORDER — FENTANYL CITRATE PF 50 MCG/ML IJ SOSY: 25.0000 ug | PREFILLED_SYRINGE | INTRAMUSCULAR | Status: DC | PRN

## 2023-09-05 MED ORDER — OXYCODONE HCL 5 MG PO TABS: 5.0000 mg | ORAL_TABLET | ORAL | Status: DC | PRN

## 2023-09-05 MED ORDER — FENTANYL CITRATE (PF) 250 MCG/5ML IJ SOLN
INTRAMUSCULAR | Status: DC | PRN
  Administered 2023-09-05 (×4): 25 ug via INTRAVENOUS

## 2023-09-05 MED ORDER — PROPOFOL 10 MG/ML IV BOLUS
INTRAVENOUS | Status: AC
  Filled 2023-09-05: qty 20

## 2023-09-05 MED ORDER — BUPIVACAINE HCL (PF) 0.5 % IJ SOLN
INTRAMUSCULAR | Status: DC | PRN
  Administered 2023-09-05: 30 mL

## 2023-09-05 MED ORDER — HEMOSTATIC AGENTS (NO CHARGE) OPTIME
TOPICAL | Status: DC | PRN
  Administered 2023-09-05: 1 via TOPICAL

## 2023-09-05 MED ORDER — LACTATED RINGERS IV SOLN: INTRAVENOUS | Status: DC

## 2023-09-05 MED ORDER — PHENYLEPHRINE HCL (PRESSORS) 10 MG/ML IV SOLN
INTRAVENOUS | Status: DC | PRN
  Administered 2023-09-05: 80 ug via INTRAVENOUS

## 2023-09-05 MED ORDER — OXYCODONE HCL 5 MG/5ML PO SOLN: 5.0000 mg | Freq: Once | ORAL | Status: DC | PRN

## 2023-09-05 MED ORDER — LIDOCAINE 2% (20 MG/ML) 5 ML SYRINGE
INTRAMUSCULAR | Status: AC
  Filled 2023-09-05: qty 5

## 2023-09-05 MED ORDER — CHLORHEXIDINE GLUCONATE 0.12 % MT SOLN: 15.0000 mL | Freq: Once | OROMUCOSAL | Status: DC

## 2023-09-05 MED ORDER — FENTANYL CITRATE (PF) 100 MCG/2ML IJ SOLN
INTRAMUSCULAR | Status: AC
  Filled 2023-09-05: qty 2

## 2023-09-05 MED ORDER — DEXAMETHASONE SODIUM PHOSPHATE 10 MG/ML IJ SOLN
INTRAMUSCULAR | Status: AC
  Filled 2023-09-05: qty 1

## 2023-09-05 MED ORDER — OXYCODONE HCL 5 MG PO TABS: 5.0000 mg | ORAL_TABLET | Freq: Once | ORAL | Status: DC | PRN

## 2023-09-05 MED ORDER — PROPOFOL 10 MG/ML IV BOLUS
INTRAVENOUS | Status: DC | PRN
  Administered 2023-09-05: 200 mg via INTRAVENOUS

## 2023-09-05 MED ORDER — DEXAMETHASONE SODIUM PHOSPHATE 10 MG/ML IJ SOLN
INTRAMUSCULAR | Status: DC | PRN
  Administered 2023-09-05: 4 mg via INTRAVENOUS

## 2023-09-05 MED ORDER — ONDANSETRON HCL 4 MG/2ML IJ SOLN
INTRAMUSCULAR | Status: AC
  Filled 2023-09-05: qty 2

## 2023-09-05 MED ORDER — ORAL CARE MOUTH RINSE: 15.0000 mL | Freq: Once | OROMUCOSAL | Status: DC

## 2023-09-05 MED ORDER — SODIUM CHLORIDE 0.9 % IV SOLN: 12.5000 mg | INTRAVENOUS | Status: DC | PRN

## 2023-09-05 MED ORDER — LIDOCAINE HCL (CARDIAC) PF 100 MG/5ML IV SOSY
PREFILLED_SYRINGE | INTRAVENOUS | Status: DC | PRN
Start: 2023-09-05 — End: 2023-09-05
  Administered 2023-09-05: 40 mg via INTRAVENOUS

## 2023-09-05 MED ORDER — PHENYLEPHRINE 80 MCG/ML (10ML) SYRINGE FOR IV PUSH (FOR BLOOD PRESSURE SUPPORT)
PREFILLED_SYRINGE | INTRAVENOUS | Status: AC
  Filled 2023-09-05: qty 10

## 2023-09-05 MED ORDER — BUPIVACAINE HCL (PF) 0.5 % IJ SOLN
INTRAMUSCULAR | Status: AC
  Filled 2023-09-05: qty 30

## 2023-09-05 MED ORDER — DOCUSATE SODIUM 100 MG PO CAPS
100.0000 mg | ORAL_CAPSULE | Freq: Two times a day (BID) | ORAL | Status: DC
  Administered 2023-09-05 – 2023-09-07 (×4): 100 mg via ORAL
  Filled 2023-09-05 (×4): qty 1

## 2023-09-05 MED ORDER — MIDAZOLAM HCL 2 MG/2ML IJ SOLN
INTRAMUSCULAR | Status: DC | PRN
Start: 2023-09-05 — End: 2023-09-05
  Administered 2023-09-05: 2 mg via INTRAVENOUS

## 2023-09-05 MED ORDER — LIDOCAINE VISCOUS HCL 2 % MT SOLN
OROMUCOSAL | Status: AC
  Filled 2023-09-05: qty 15

## 2023-09-05 MED ORDER — MIDAZOLAM HCL 2 MG/2ML IJ SOLN
INTRAMUSCULAR | Status: AC
  Filled 2023-09-05: qty 2

## 2023-09-05 SURGICAL SUPPLY — 29 items
BAG HAMPER (MISCELLANEOUS) ×1 IMPLANT
CLOTH BEACON ORANGE TIMEOUT ST (SAFETY) ×1 IMPLANT
COVER LIGHT HANDLE STERIS (MISCELLANEOUS) ×2 IMPLANT
DISSECTOR SURG LIGASURE 21 (MISCELLANEOUS) ×1 IMPLANT
DRAPE HALF SHEET 40X57 (DRAPES) ×2 IMPLANT
DRSG TEGADERM 4X10 (GAUZE/BANDAGES/DRESSINGS) IMPLANT
ELECTRODE REM PT RTRN 9FT ADLT (ELECTROSURGICAL) ×1 IMPLANT
GAUZE 4X4 16PLY ~~LOC~~+RFID DBL (SPONGE) ×1 IMPLANT
GAUZE SPONGE 4X4 12PLY STRL (GAUZE/BANDAGES/DRESSINGS) ×1 IMPLANT
GLOVE BIO SURGEON STRL SZ 6.5 (GLOVE) ×1 IMPLANT
GLOVE BIOGEL PI IND STRL 6.5 (GLOVE) ×1 IMPLANT
GLOVE BIOGEL PI IND STRL 7.0 (GLOVE) ×2 IMPLANT
GOWN STRL REUS W/TWL LRG LVL3 (GOWN DISPOSABLE) ×2 IMPLANT
HEMOSTAT SURGICEL 4X8 (HEMOSTASIS) ×1 IMPLANT
KIT TURNOVER CYSTO (KITS) ×1 IMPLANT
MANIFOLD NEPTUNE II (INSTRUMENTS) ×1 IMPLANT
NDL HYPO 18GX1.5 BLUNT FILL (NEEDLE) ×1 IMPLANT
NDL HYPO 21X1.5 SAFETY (NEEDLE) ×1 IMPLANT
NEEDLE HYPO 18GX1.5 BLUNT FILL (NEEDLE) ×1 IMPLANT
NEEDLE HYPO 21X1.5 SAFETY (NEEDLE) ×1 IMPLANT
NS IRRIG 500ML POUR BTL (IV SOLUTION) IMPLANT
PACK PERI GYN (CUSTOM PROCEDURE TRAY) ×1 IMPLANT
PAD ARMBOARD POSITIONER FOAM (MISCELLANEOUS) ×1 IMPLANT
POSITIONER HEAD 8X9X4 ADT (SOFTGOODS) ×1 IMPLANT
SET BASIN LINEN APH (SET/KITS/TRAYS/PACK) ×1 IMPLANT
SPONGE SURGIFOAM ABS GEL 100 (HEMOSTASIS) ×1 IMPLANT
SURGILUBE 2OZ TUBE FLIPTOP (MISCELLANEOUS) ×1 IMPLANT
SUT SILK 0 FSL (SUTURE) ×1 IMPLANT
SYR 30ML LL (SYRINGE) ×2 IMPLANT

## 2023-09-05 NOTE — TOC Initial Note (Signed)
 Transition of Care [facility]) - Initial/Assessment Note    Patient Details  Name: Nathan Shepherd MRN: [id number] Date of Birth: 03/05/1955  [facility] CM/SW Contact:    Lucie Lunger, LCSWA Phone Number: 09/05/2023, 11:12 AM  Clinical Narrative:                 CSW notes per chart review that pt arrived from [facility]. CSW spoke to Nathan Shepherd who states they assist pt with his ADLs and he is independent when ambulating. Nathan Shepherd requested a PT eval, CSW sent request to MD. TOC to follow.   Expected Discharge Plan: Assisted Living Barriers to Discharge: Continued Medical Work up   Patient Goals and CMS Choice Patient states their goals for this hospitalization and ongoing recovery are:: From ALF CMS Medicare.gov Compare Post Acute Care list provided to:: Patient Represenative (must comment) Choice offered to / list presented to : Patient, [facility] POA / Guardian      Expected Discharge Plan and Services In-house Referral: Clinical Social Work Discharge Planning Services: CM Consult Post Acute Care Choice: Resumption of Svcs/PTA Provider Living arrangements for the past 2 months: Assisted Living Facility                                      Prior Living Arrangements/Services Living arrangements for the past 2 months: Assisted Living Facility Lives with:: Facility Resident Patient language and need for interpreter reviewed:: Yes Do you feel safe going back to the place where you live?: Yes      Need for Family Participation in Patient Care: Yes (Comment) Care giver support system in place?: Yes (comment) Current home services: DME Criminal Activity/Legal Involvement Pertinent to Current Situation/Hospitalization: No - Comment as needed  Activities of Daily Living   ADL Screening (condition at time of admission) Independently performs ADLs?: Yes (appropriate for developmental age) Is the patient deaf or have difficulty hearing?: No Does  the patient have difficulty seeing, even when wearing glasses/contacts?: No Does the patient have difficulty concentrating, remembering, or making decisions?: No  Permission Sought/Granted                  Emotional Assessment Appearance:: Appears stated age Attitude/Demeanor/Rapport: Engaged Affect (typically observed): Accepting Orientation: : Oriented to Self, Oriented to Place, Oriented to  Time, Oriented to Situation Alcohol / Substance Use: Not Applicable Psych Involvement: No (comment)  Admission diagnosis:  Rectal bleeding [K62.5] Supratherapeutic INR [R79.1] Patient Active Problem List   Diagnosis Date Noted   Rectal bleeding 09/04/2023   Anemia 03/24/2023   Elevated INR 03/24/2023   Rectal prolapse 03/24/2023   Supratherapeutic INR 03/23/2023   Hypoalbuminemia due to protein-calorie malnutrition (HCC) 03/23/2023   Essential hypertension 03/23/2023   Mixed hyperlipidemia 03/23/2023   History of CVA (cerebrovascular accident) - not on DOAC due to interaction with AEDs 03/23/2023   GI bleed 03/22/2023   Seizure (HCC) - cannot be on DOAC due to concurrent treatment with Depakote . 10/22/2022   Atrial fibrillation, chronic (HCC) - cannot be on DOAC due to concurrent treatment with Depakote . 08/25/2021   Acute kidney injury superimposed on chronic kidney disease (HCC) - Baseline scr 1.2-1.6 08/25/2021   PCP:  Gammon, Chrystal, NP Pharmacy:   [facility] - [place], [place] - [address] [place] [address] Phone: [phone] Fax: [phone]  [facility]  [address] [place] [address] Phone: [phone] Fax: [phone]     Social Drivers of Health (SDOH) Social History: SDOH Screenings   Food Insecurity: No Food Insecurity (09/04/2023)  Housing: Low Risk  (09/04/2023)  Transportation Needs: No Transportation Needs (09/04/2023)  Utilities: Not At Risk (09/04/2023)  Social Connections:  Moderately Isolated (09/04/2023)  Tobacco Use: Low Risk  (07/26/2023)   SDOH Interventions:     Readmission Risk Interventions     No data to display

## 2023-09-05 NOTE — Op Note (Signed)
 [facility] Operative Note  09/05/23  Preoperative Diagnosis: Rectal bleeding, Grade IV and external hemorrhoidal tissue   Postoperative Diagnosis: Same   Procedure(s) Performed: Extensive hemorrhoidectomy, right anterior hemorrhoid column internal and external, right posterior hemorrhoid column internal and external, left lateral hemorrhoid column internal, minor external tissue.    Surgeon: Manuelita BROCKS. [name], MD   Assistants: No qualified resident was available    Anesthesia: General endotracheal   Anesthesiologist: Dr. Landry, MD    Specimens:  Right anterior, right posterior (exophytic like tissue on column protruding out), left lateral    Estimated Blood Loss: Minimal   Blood Replacement: None    Complications: None   Wound Class: Dirty (Stool in vault)   Operative Indications:  Mr. Celaya is a 68 yo who has supra-therapeutic INR periodically and  rectal bleeding requiring admission. He had a colonoscopy in March and was noted to also rectal mucosa protruding out biopsy showed benign pathology. There was some mention of concern for rectal prolapse but my exam and review of the photos demonstrated tissue consistent with three column hemorrhoidal tissue and no concentric circles.  We discussed excision of the hemorrhoids and risk of bleeding, infection, scarring/ anal narrowing, muscle injury/ incontinence, and pain after surgery. I also discussed this with his legal guardian.    Findings: Large right anterior grade IV hemorrhoid, large right posterior grade IV hemorrhoid with exophytic like tissue on column protruding out, left lateral grade III-grade IV    Procedure: The patient was taken to the operating room and placed supine. General endotracheal anesthesia was induced. Intravenous antibiotics were not administered per protocol as the patient is not diabetic.  The perianal and perineal area were prepped and draped in the usual sterile fashion.   I  examined the rectum and anus noting the three columns with large protruding hemorrhoids right anterior grade IV hemorrhoid, large right posterior grade IV hemorrhoid with exophytic like tissue on column protruding out, left lateral grade III-grade IV. There was no signs of rectal prolapse. He had no other masses. The largest was the right posterior with the extra growth of tissue likely from chronic prolapse.   I placed the anoscope and elevated the right posterior hemorrhoidal column and incise the skin with cautery. Taking care to not injure the sphincter, an open hemorrhoidectomy of the external and internal component was done with a small handheld ligasure.  The same was done with the right anterior and left lateral ensuring there was adequate skin Wyolene Weimann between the hemorrhoid excision.  Hemostasis was achieved. There was some stool in the vault. Marcaine  was injected. A gelfoam and Surgicel were placed in the anus to help with hemostasis.   Final inspection revealed acceptable hemostasis. All counts were correct at the end of the case. The patient was awakened from anesthesia and extubated without complication.  The patient went to the PACU in stable condition.   Manuelita [name], MD [facility] [address] [place], [place] [address] [phone] (office)

## 2023-09-05 NOTE — Progress Notes (Addendum)
 1139: Spoke to [place] with surgery and gave nursing report. Explained consent has not been signed, awaiting call back from patients guardian for this

## 2023-09-05 NOTE — Anesthesia Preprocedure Evaluation (Addendum)
 Anesthesia Evaluation  Patient identified by MRN, date of birth, ID band Patient awake    Reviewed: Allergy & Precautions, H&P , NPO status , Patient's Chart, lab work & pertinent test results, reviewed documented beta blocker date and time   Airway Mallampati: II  TM Distance: >3 FB Neck ROM: full    Dental  (+) Dental Advisory Given, Poor Dentition, Missing   Pulmonary neg pulmonary ROS   Pulmonary exam normal breath sounds clear to auscultation       Cardiovascular Exercise Tolerance: Good hypertension, +CHF  Normal cardiovascular exam+ dysrhythmias Atrial Fibrillation + pacemaker  Rhythm:regular Rate:Normal  Great EF   Neuro/Psych Seizures -,  CVA  negative psych ROS   GI/Hepatic Neg liver ROS,,,Lower GI bleeding   Endo/Other  negative endocrine ROS    Renal/GU CRFRenal diseaseStage 3 CKD  negative genitourinary   Musculoskeletal   Abdominal   Peds  Hematology negative hematology ROS (+)   Anesthesia Other Findings   Reproductive/Obstetrics negative OB ROS                              Anesthesia Physical Anesthesia Plan  ASA: 3  Anesthesia Plan: General   Post-op Pain Management: Dilaudid IV   Induction: Intravenous  PONV Risk Score and Plan: Ondansetron , Dexamethasone  and Midazolam   Airway Management Planned: LMA  Additional Equipment: None  Intra-op Plan:   Post-operative Plan:   Informed Consent: I have reviewed the patients History and Physical, chart, labs and discussed the procedure including the risks, benefits and alternatives for the proposed anesthesia with the patient or authorized representative who has indicated his/her understanding and acceptance.     Dental Advisory Given and Consent reviewed with POA  Plan Discussed with: CRNA  Anesthesia Plan Comments: (Got consent from his family)         Anesthesia Quick Evaluation

## 2023-09-05 NOTE — Consult Note (Addendum)
 [facility] Consult  Reason for Consult: Rectal bleeding Referring Physician:  Dr. Maree  Chief Complaint   Rectal Bleeding     HPI: Nathan Shepherd is a 68 y.o. male with A fib, CKD, HTN, Seizures on Warfarin with supra-therapeutic INR to 8 who comes in with rectal bleeding. He lives in a group home and has a legal guardian. He tells me he has soft Bms daily but he does notice some blood. He was admitted in March with GIB and colonoscopy endoscopy demonstrated hemorrhoids and a hemorrhoid tissue/ external hemorrhoid which Dr. Cinderella biopsied and was benign. His Hgb in July was 14 and he was down to 10 on admission.   He was reversed with Vit K and is still NPO given the fact that he continues to have bleeding. There was documentation that GI wanted him referred to Surgery but he did not get that referral from what I can tell.    The patient  denied any fecal incontinence. His Guardian, Nathan Shepherd reports that he has complained of some pain with the hemorrhoids but he did not complain this AM.   I reviewed his colonoscopy and photos and the prolapsed tissue looks like hemorrhoidal column. It does not have the concentric circles of prolapse.   Past Medical History:  Diagnosis Date   CHF (congestive heart failure) (HCC)    CKD (chronic kidney disease) stage 3, GFR 30-59 ml/min (HCC)    Edema    Hyperlipemia    Hypertension    Seizures (HCC)     Past Surgical History:  Procedure Laterality Date   COLONOSCOPY N/A 03/24/2023   Procedure: COLONOSCOPY;  Surgeon: Cinderella [name], MD;  Location: [facility];  Service: Endoscopy;  Laterality: N/A;   ESOPHAGOGASTRODUODENOSCOPY N/A 03/24/2023   Procedure: EGD (ESOPHAGOGASTRODUODENOSCOPY);  Surgeon: Cinderella [name], MD;  Location: [facility];  Service: Endoscopy;  Laterality: N/A;   NO PAST SURGERIES      Family History  Problem Relation Age of Onset   Colon cancer Neg Hx    Liver disease Neg Hx      Social History   Tobacco Use   Smoking status: Never   Smokeless tobacco: Never  Vaping Use   Vaping status: Never Used  Substance Use Topics   Alcohol use: Not Currently   Drug use: Never    Medications: I have reviewed the patient's current medications. Prior to Admission:  Medications Prior to Admission  Medication Sig Dispense Refill Last Dose/Taking   acetaminophen  (TYLENOL ) 325 MG tablet Take 650 mg by mouth every 4 (four) hours as needed for mild pain (pain score 1-3).      amLODipine  (NORVASC ) 2.5 MG tablet Take 2.5 mg by mouth daily.      aspirin  EC 81 MG tablet Take 81 mg by mouth daily.      Bismuth /Metronidaz/Tetracyclin (PYLERA) 140-125-125 MG CAPS Take 3 capsules by mouth in the morning, at noon, in the evening, and at bedtime for 10 days. 120 capsule 0    Calcium  Carbonate Antacid (ANTACID PO) Take 30 mLs by mouth 4 (four) times daily as needed (heartburn, indigestion). Antacid Suspension      carvedilol  (COREG ) 12.5 MG tablet Take 12.5 mg by mouth 2 (two) times daily with a meal.      diphenhydrAMINE  (BENADRYL ) 25 MG tablet Take 25 mg by mouth every 6 (six) hours as needed for allergies or itching.      divalproex  (DEPAKOTE ) 250 MG DR tablet Take 3  tablets (750 mg total) by mouth every 12 (twelve) hours. 180 tablet 0    docusate sodium  (COLACE) 100 MG capsule Take 100 mg by mouth at bedtime.      donepezil  (ARICEPT ) 5 MG tablet Take 5 mg by mouth at bedtime.      Emollient (EUCERIN) lotion Apply 1 Application topically daily. (Apply to legs and feet)      enoxaparin  (LOVENOX ) 150 MG/ML injection Inject 150 mg into the skin daily.      feeding supplement (ENSURE ENLIVE / ENSURE PLUS) LIQD Take 237 mLs by mouth 2 (two) times daily between meals. 237 mL 12    fluocinolone 0.01 % cream Apply 1 application  topically 3 (three) times daily as needed (bug bites/rash).      guaiFENesin  (ROBITUSSIN) 100 MG/5ML liquid Take 10 mLs by mouth every 6 (six) hours as needed for  cough or to loosen phlegm.      levETIRAcetam  (KEPPRA ) 1000 MG tablet Take 1,000 mg by mouth 2 (two) times daily.      loperamide  (IMODIUM ) 2 MG capsule Take 1 capsule (2 mg total) by mouth as needed for diarrhea or loose stools. (Patient taking differently: Take 2 mg by mouth as needed for diarrhea or loose stools. (Max 8 doses in 24 hours)) 30 capsule 0    losartan  (COZAAR ) 50 MG tablet Take 50 mg by mouth daily.      magnesium  hydroxide (MILK OF MAGNESIA) 400 MG/5ML suspension Take 30 mLs by mouth at bedtime as needed for mild constipation.      NIFEdipine  (PROCARDIA  XL/NIFEDICAL XL) 60 MG 24 hr tablet Take 60 mg by mouth daily.      omeprazole  (PRILOSEC) 20 MG capsule Take 1 capsule (20 mg total) by mouth 2 (two) times daily before a meal for 14 days. Take 30 min before breakfast and 30 min before dinner 28 capsule 0    rosuvastatin  (CRESTOR ) 20 MG tablet Take 20 mg by mouth at bedtime.       SYMBICORT  160-4.5 MCG/ACT inhaler Inhale 2 puffs into the lungs.      torsemide  (DEMADEX ) 20 MG tablet Take 20 mg by mouth daily.      Vitamin D , Ergocalciferol , (DRISDOL ) 1.25 MG (50000 UNIT) CAPS capsule Take 50,000 Units by mouth every Monday.      warfarin (COUMADIN ) 7.5 MG tablet Take 7.5 mg by mouth every evening.      Scheduled:  carvedilol   12.5 mg Oral BID WC   divalproex   750 mg Oral Q12H   donepezil   5 mg Oral QHS   feeding supplement  237 mL Oral BID BM   fluticasone  furoate-vilanterol  1 puff Inhalation Daily   levETIRAcetam   1,000 mg Oral BID   losartan   50 mg Oral Daily   NIFEdipine   60 mg Oral Daily   rosuvastatin   20 mg Oral QHS   torsemide   20 mg Oral Daily   Continuous: PRN:acetaminophen  **OR** acetaminophen , ondansetron  **OR** ondansetron  (ZOFRAN ) IV  Allergies  Allergen Reactions   Penicillins Hives    Tolerated Zosyn      ROS:  A comprehensive review of systems was negative except for: Gastrointestinal: positive for rectal bleeding  Blood pressure (!) 148/104, pulse  70, temperature 98.3 F (36.8 C), temperature source Oral, resp. rate 19, height 6' 4 (1.93 m), weight 80.1 kg, SpO2 99%. Physical Exam Vitals reviewed. Exam conducted with a chaperone present.  HENT:     Head: Normocephalic.     Nose: Nose normal.  Eyes:  Extraocular Movements: Extraocular movements intact.  Cardiovascular:     Rate and Rhythm: Normal rate.  Pulmonary:     Effort: Pulmonary effort is normal.  Abdominal:     General: There is no distension.     Palpations: Abdomen is soft.     Tenderness: There is no abdominal tenderness.  Genitourinary:    Comments: Rectal tissue, external hemorrhoidal tissue noted, no obvious bleeding, some minor mucus noted Musculoskeletal:        General: Normal range of motion.  Skin:    General: Skin is warm.  Neurological:     General: No focal deficit present.     Mental Status: He is alert.  Psychiatric:        Mood and Affect: Mood normal.        Behavior: Behavior normal.        Thought Content: Thought content normal.     Results: Results for orders placed or performed during the hospital encounter of 09/04/23 (from the past 48 hours)  Basic metabolic panel     Status: Abnormal   Collection Time: 09/04/23  9:10 AM  Result Value Ref Range   Sodium 142 135 - 145 mmol/L   Potassium 3.2 (L) 3.5 - 5.1 mmol/L   Chloride 102 98 - 111 mmol/L   CO2 28 22 - 32 mmol/L   Glucose, Bld 84 70 - 99 mg/dL    Comment: Glucose reference range applies only to samples taken after fasting for at least 8 hours.   BUN 17 8 - 23 mg/dL   Creatinine, Ser 8.69 (H) 0.61 - 1.24 mg/dL   Calcium  8.5 (L) 8.9 - 10.3 mg/dL   GFR, Estimated >39 >39 mL/min    Comment: (NOTE) Calculated using the CKD-EPI Creatinine Equation (2021)    Anion gap 12 5 - 15    Comment: Performed at [facility], [address], [place], [place] [address]  CBC with Differential     Status: Abnormal   Collection Time: 09/04/23  9:10 AM  Result Value Ref Range   WBC 5.3 4.0  - 10.5 K/uL   RBC 3.78 (L) 4.22 - 5.81 MIL/uL   Hemoglobin 10.6 (L) 13.0 - 17.0 g/dL   HCT 66.5 (L) 60.9 - 47.9 %   MCV 88.4 80.0 - 100.0 fL   MCH 28.0 26.0 - 34.0 pg   MCHC 31.7 30.0 - 36.0 g/dL   RDW 84.3 (H) 88.4 - 84.4 %   Platelets 177 150 - 400 K/uL   nRBC 0.0 0.0 - 0.2 %   Neutrophils Relative % 67 %   Neutro Abs 3.5 1.7 - 7.7 K/uL   Lymphocytes Relative 17 %   Lymphs Abs 0.9 0.7 - 4.0 K/uL   Monocytes Relative 15 %   Monocytes Absolute 0.8 0.1 - 1.0 K/uL   Eosinophils Relative 0 %   Eosinophils Absolute 0.0 0.0 - 0.5 K/uL   Basophils Relative 1 %   Basophils Absolute 0.0 0.0 - 0.1 K/uL   Immature Granulocytes 0 %   Abs Immature Granulocytes 0.02 0.00 - 0.07 K/uL    Comment: Performed at [facility], [address], [place], [place] [address]  Protime-INR     Status: Abnormal   Collection Time: 09/04/23  9:10 AM  Result Value Ref Range   Prothrombin Time 69.7 (H) 11.4 - 15.2 seconds   INR 8.0 (HH) 0.8 - 1.2    Comment: REPEATED TO VERIFY CRITICAL RESULT CALLED TO, READ BACK BY AND VERIFIED WITH:  J.MARK AT 1044 ON 09.01.25 BY ADGER J  (NOTE) INR goal varies based on device and disease states. Performed at [facility], [address], [place], [place] [address]   Type and screen [facility]     Status: None   Collection Time: 09/04/23  9:10 AM  Result Value Ref Range   ABO/RH(D) B POS    Antibody Screen NEG    Sample Expiration      09/07/2023,2359 Performed at [facility], [address], [place], [place] [address]   HIV Antibody (routine testing w rflx)     Status: None   Collection Time: 09/04/23  3:49 PM  Result Value Ref Range   HIV Screen 4th Generation wRfx Non Reactive Non Reactive    Comment: Performed at [facility], [address] [place], [place] [address]  Magnesium      Status: None   Collection Time: 09/05/23  4:37 AM  Result Value Ref Range   Magnesium  1.7 1.7 - 2.4 mg/dL    Comment: Performed at [facility], [address], [place], [place] [address]  Protime-INR     Status: Abnormal   Collection Time: 09/05/23  4:37 AM  Result Value Ref Range   Prothrombin Time 22.0 (H) 11.4 - 15.2 seconds   INR 1.8 (H) 0.8 - 1.2    Comment: (NOTE) INR goal varies based on device and disease states. Performed at [facility], [address], [place], [place] [address]   Basic metabolic panel     Status: Abnormal   Collection Time: 09/05/23  4:37 AM  Result Value Ref Range   Sodium 141 135 - 145 mmol/L   Potassium 3.6 3.5 - 5.1 mmol/L   Chloride 102 98 - 111 mmol/L   CO2 32 22 - 32 mmol/L   Glucose, Bld 95 70 - 99 mg/dL    Comment: Glucose reference range applies only to samples taken after fasting for at least 8 hours.   BUN 20 8 - 23 mg/dL   Creatinine, Ser 8.65 (H) 0.61 - 1.24 mg/dL   Calcium  9.0 8.9 - 10.3 mg/dL   GFR, Estimated 59 (L) >60 mL/min    Comment: (NOTE) Calculated using the CKD-EPI Creatinine Equation (2021)    Anion gap 7 5 - 15    Comment: Performed at [facility], [address] [place], [place] [address]  CBC     Status: Abnormal   Collection Time: 09/05/23  4:37 AM  Result Value Ref Range   WBC 6.8 4.0 - 10.5 K/uL   RBC 3.63 (L) 4.22 - 5.81 MIL/uL   Hemoglobin 10.1 (L) 13.0 - 17.0 g/dL   HCT 67.9 (L) 60.9 - 47.9 %   MCV 88.2 80.0 - 100.0 fL   MCH 27.8 26.0 - 34.0 pg   MCHC 31.6 30.0 - 36.0 g/dL   RDW 84.3 (H) 88.4 - 84.4 %   Platelets 191 150 - 400 K/uL   nRBC 0.0 0.0 - 0.2 %    Comment: Performed at [facility], [address], [place], [place] [address]    No results found.   Assessment & Plan:  Nathan Shepherd is a 68 y.o. male with hemorrhoids and external hemorrhoid tissue. He is bleeding and requires coumadin  for A fib and history of a Stroke. This does not look like prolapse to me on exam or on the photos from the colonoscopy in March.   I spoke with Nathan Shepherd and her director, Nathan Shepherd and  obtained consent from them for surgery. Dr. Landry also spoke with  him.   Hemorrhoid surgery for external hemorrhoids is very painful. The pain and discomfort that the patient is having currently will be magnified after the surgery for at least 2-3 weeks.  The patient will have feelings of constant pressure and pain in the area from the swelling and removal of the anoderm (skin around the anus). The internal hemorrhoids are not painful to remove because the same nerves are not involved, and the sensation is different, but removal of any external hemorrhoids will cause significant discomfort. They will need at least 4-6 weeks to recover from the surgery, and should not expect to be able to feel back to normal for 6-8 weeks.    The risk of hemorrhoid surgery include bleeding, risk of infection although rare, and the risk of narrowing the anal canal if too much tissue is removed. Given this risk, it is likely that only the 2 largest hemorrhoid columns would be removed during the initial surgery.  We have also discussed the risk of incontinence after surgery if the muscles were injured, and although this is rare that it can happen and is another reason to limit the amount of hemorrhoids removed.     All questions were answered to the satisfaction of the patient.  Lab Results  Component Value Date   INR 1.8 (H) 09/05/2023   INR 8.0 (HH) 09/04/2023   INR 4.0 (H) 07/25/2023     Manuelita JAYSON Pander 09/05/2023, 11:44 AM

## 2023-09-05 NOTE — Plan of Care (Signed)
  Problem: Activity: Goal: Risk for activity intolerance will decrease Outcome: Progressing   Problem: Nutrition: Goal: Adequate nutrition will be maintained Outcome: Progressing   Problem: Coping: Goal: Level of anxiety will decrease Outcome: Progressing   Problem: Pain Managment: Goal: General experience of comfort will improve and/or be controlled Outcome: Progressing

## 2023-09-05 NOTE — Transfer of Care (Signed)
 Immediate Anesthesia Transfer of Care Note  [name]  Procedure(s) Performed: EXTENSIVE HEMORRHOIDECTOMY (Rectum)  Patient Location: PACU  Anesthesia Type:General  Level of Consciousness: drowsy, patient cooperative, and responds to stimulation  Airway & Oxygen Therapy: Patient Spontanous Breathing and Patient connected to nasal cannula oxygen  Post-op Assessment: Report given to RN and Post -op Vital signs reviewed and stable  Post vital signs: Reviewed and stable  Last Vitals:  Vitals Value Taken Time  BP 122/93 09/05/23 13:08  Temp    Pulse 67 09/05/23 13:09  Resp 11 09/05/23 13:11  SpO2 100 % 09/05/23 13:09  Vitals shown include unfiled device data.  Last Pain:  Vitals:   09/05/23 1158  TempSrc: Oral  PainSc: 0-No pain         Complications: No notable events documented.

## 2023-09-05 NOTE — Discharge Instructions (Addendum)
 SEND PATIENT HOME WITH SITZ BATH OVER TOILET TO USE AT HIS FACILITY. Please take your roxicodone  as prescribed and alternate with tylenol  every 4-6 hours.   Do not take any aspirin  or NSAIDs, ibuprofen  for 5 days (not until after 09/10/2023).  Please keep the area clean and dry and take Sitz baths every 4-6 hours (swallow warm water baths) for comfort and after bowel movements.  Please keep your stools soft and take fiber daily (metamucil) and colace (over the counter) to help prevent constipation.  If you have not had a BM in 2 days, please notify Dr. [name].  Expect some bleeding following the hemorrhoid surgery and significant pain.  Go to the ED with extensive bleeding, fevers, or chills.

## 2023-09-05 NOTE — Progress Notes (Signed)
 PROGRESS NOTE    Sajid Ruppert  FMW:[id number] DOB: [birth year] DOA: 09/04/2023 PCP: Wilmon Penton, NP   Brief Narrative:    Nathan Shepherd is a 68 y.o. male with medical history significant for seizures, chronic atrial fibrillation on warfarin, polysubstance abuse, history of CVA, CKD stage IIIa, and supratherapeutic INR who presented to the ED with complaints of rectal bleeding.  Patient was noted to have supratherapeutic INR and underwent reversal with IV and oral vitamin K .  He has stable hemoglobin levels and was seen by general surgery and underwent extensive hemorrhoidectomy on 9/2.  Assessment & Plan:   Principal Problem:   Rectal bleeding Active Problems:   Atrial fibrillation, chronic (HCC) - cannot be on DOAC due to concurrent treatment with Depakote .   Essential hypertension   Seizure (HCC) - cannot be on DOAC due to concurrent treatment with Depakote .   Mixed hyperlipidemia   History of CVA (cerebrovascular accident) - not on DOAC due to interaction with AEDs   Anemia   Elevated INR   Rectal prolapse   Hemorrhoid prolapse   Grade IV hemorrhoids  Assessment and Plan:   Rectal bleeding from hemorrhoids secondary to supratherapeutic INR-resolved, status post extensive hemorrhoidectomy 9/2 - Noted to be on Coumadin  for atrial fibrillation and cannot use DOAC due to Depakote  use - Hold Lovenox  for now until approved by general surgery -Resume diet -Avoid NSAIDs until 9/7 -Await bowel movement -Sitz bath   Supratherapeutic INR-resolved - Continue to monitor - Patient will require Lovenox  bridging on discharge   Essential hypertension - Continue amlodipine , Coreg , losartan    Chronic atrial fibrillation - Cannot be on DOAC due to concurrent treatment with Depakote  - Continue Coreg  -Avoid anticoagulation for now   History of CVA/dyslipidemia - Crestor  and continue anticoagulation outpatient   Seizure - Keppra  1000 mg twice daily and Depakote      DVT prophylaxis: SCDs Code Status: Full Family Communication: Discussed with legal guardian and followed by general surgery 9/2 Disposition Plan:  Status is: Inpatient Remains inpatient appropriate because: Need for IV medications.  Consultants:  General surgery  Procedures:  Extensive hemorrhoidectomy 9/2  Antimicrobials:  None  Subjective: Patient seen and evaluated today with no new acute complaints or concerns. No acute concerns or events noted overnight.  No further rectal bleeding noted and he states that he is hungry.  Objective: Vitals:   09/05/23 1024 09/05/23 1158 09/05/23 1308 09/05/23 1330  BP: (!) 148/104 (!) 153/105 (!) 122/93 (!) 151/99  Pulse: 70 74 67 (!) 59  Resp:  13  15  Temp:  98.2 F (36.8 C) 98.2 F (36.8 C)   TempSrc:  Oral    SpO2: 99% 100% 100% 100%  Weight:  80.1 kg    Height:  6' 4 (1.93 m)      Intake/Output Summary (Last 24 hours) at 09/05/2023 1346 Last data filed at 09/05/2023 1257 Gross per 24 hour  Intake 500 ml  Output 700 ml  Net -200 ml   Filed Weights   09/04/23 0857 09/04/23 1158 09/05/23 1158  Weight: 88.5 kg 80.1 kg 80.1 kg    Examination:  General exam: Appears calm and comfortable  Respiratory system: Clear to auscultation. Respiratory effort normal. Cardiovascular system: S1 & S2 heard, RRR.  Gastrointestinal system: Abdomen is soft Central nervous system: Alert and awake Extremities: No edema Skin: No significant lesions noted Psychiatry: Flat affect.    Data Reviewed: I have personally reviewed following labs and imaging studies  CBC: Recent Labs  Lab 09/04/23 0910 09/05/23 0437  WBC 5.3 6.8  NEUTROABS 3.5  --   HGB 10.6* 10.1*  HCT 33.4* 32.0*  MCV 88.4 88.2  PLT 177 191   Basic Metabolic Panel: Recent Labs  Lab 09/04/23 0910 09/05/23 0437  NA 142 141  K 3.2* 3.6  CL 102 102  CO2 28 32  GLUCOSE 84 95  BUN 17 20  CREATININE 1.30* 1.34*  CALCIUM  8.5* 9.0  MG  --  1.7   GFR: Estimated  Creatinine Clearance: 62.3 mL/min (A) (by C-G formula based on SCr of 1.34 mg/dL (H)). Liver Function Tests: No results for input(s): AST, ALT, ALKPHOS, BILITOT, PROT, ALBUMIN in the last 168 hours. No results for input(s): LIPASE, AMYLASE in the last 168 hours. No results for input(s): AMMONIA in the last 168 hours. Coagulation Profile: Recent Labs  Lab 09/04/23 0910 09/05/23 0437  INR 8.0* 1.8*   Cardiac Enzymes: No results for input(s): CKTOTAL, CKMB, CKMBINDEX, TROPONINI in the last 168 hours. BNP (last 3 results) No results for input(s): PROBNP in the last 8760 hours. HbA1C: No results for input(s): HGBA1C in the last 72 hours. CBG: No results for input(s): GLUCAP in the last 168 hours. Lipid Profile: No results for input(s): CHOL, HDL, LDLCALC, TRIG, CHOLHDL, LDLDIRECT in the last 72 hours. Thyroid Function Tests: No results for input(s): TSH, T4TOTAL, FREET4, T3FREE, THYROIDAB in the last 72 hours. Anemia Panel: No results for input(s): VITAMINB12, FOLATE, FERRITIN, TIBC, IRON, RETICCTPCT in the last 72 hours. Sepsis Labs: No results for input(s): PROCALCITON, LATICACIDVEN in the last 168 hours.  No results found for this or any previous visit (from the past 240 hours).       Radiology Studies: No results found.      Scheduled Meds:  [MAR Hold] carvedilol   12.5 mg Oral BID WC   chlorhexidine   15 mL Mouth/Throat Once   Or   mouth rinse  15 mL Mouth Rinse Once   [MAR Hold] divalproex   750 mg Oral Q12H   [MAR Hold] donepezil   5 mg Oral QHS   [MAR Hold] feeding supplement  237 mL Oral BID BM   [MAR Hold] fluticasone  furoate-vilanterol  1 puff Inhalation Daily   [MAR Hold] levETIRAcetam   1,000 mg Oral BID   [MAR Hold] losartan   50 mg Oral Daily   [MAR Hold] NIFEdipine   60 mg Oral Daily   [MAR Hold] rosuvastatin   20 mg Oral QHS   [MAR Hold] torsemide   20 mg Oral Daily   Continuous  Infusions:  lactated ringers      lactated ringers      promethazine (PHENERGAN) injection (IM or IVPB)       LOS: 0 days    Time spent: 55 minutes    Mohsin Crum JONETTA Fairly, DO [facility]  If 7PM-7AM, please contact night-coverage [email or web address] 09/05/2023, 1:46 PM

## 2023-09-05 NOTE — Anesthesia Postprocedure Evaluation (Signed)
 Anesthesia Post Note  [name]  Procedure(s) Performed: EXTENSIVE HEMORRHOIDECTOMY (Rectum)  Patient location during evaluation: PACU Anesthesia Type: General Level of consciousness: awake and alert Pain management: pain level controlled Vital Signs Assessment: post-procedure vital signs reviewed and stable Respiratory status: spontaneous breathing, nonlabored ventilation, respiratory function stable and patient connected to nasal cannula oxygen Cardiovascular status: blood pressure returned to baseline and stable Postop Assessment: no apparent nausea or vomiting Anesthetic complications: no   There were no known notable events for this encounter.   Last Vitals:  Vitals:   09/05/23 1345 09/05/23 1400  BP: (!) 141/98 (!) 150/93  Pulse:    Resp:    Temp:    SpO2:      Last Pain:  Vitals:   09/05/23 1330  TempSrc:   PainSc: 0-No pain                 Baleria Wyman L Jarian Longoria

## 2023-09-05 NOTE — Anesthesia Procedure Notes (Signed)
 Procedure Name: LMA Insertion Date/Time: 09/05/2023 12:25 PM  Performed by: Pheobe Adine CROME, CRNAPre-anesthesia Checklist: Patient identified, Emergency Drugs available, Suction available, Patient being monitored and Timeout performed Patient Re-evaluated:Patient Re-evaluated prior to induction Oxygen Delivery Method: Circle system utilized Preoxygenation: Pre-oxygenation with 100% oxygen Induction Type: IV induction LMA: LMA inserted LMA Size: 5.0 Number of attempts: 1 Placement Confirmation: positive ETCO2, CO2 detector and breath sounds checked- equal and bilateral Tube secured with: Tape Dental Injury: Teeth and Oropharynx as per pre-operative assessment

## 2023-09-05 NOTE — Progress Notes (Addendum)
 [facility]  Update [facility], surgery done, three column hemorrhoidectomy completed.   Hold on any therapeutic blood thinner today.  H&H tomorrow   Will do stool softener  Diet as tolerated NO TORADOL or NSAIDS until 09/10/2023.  Will prescribe Roxicodone  for pain.    Sitz bath every 4 hours and after BM. RN will need to get sitz bath from downstairs.  [name], MD [facility] [address] [place], [place] [address] [phone] (office)

## 2023-09-06 ENCOUNTER — Encounter (HOSPITAL_COMMUNITY): Payer: Self-pay | Admitting: General Surgery

## 2023-09-06 DIAGNOSIS — K625 Hemorrhage of anus and rectum: Secondary | ICD-10-CM | POA: Diagnosis not present

## 2023-09-06 LAB — CBC WITH DIFFERENTIAL/PLATELET
Abs Immature Granulocytes: 0.05 K/uL (ref 0.00–0.07)
Basophils Absolute: 0 K/uL (ref 0.0–0.1)
Basophils Relative: 0 %
Eosinophils Absolute: 0 K/uL (ref 0.0–0.5)
Eosinophils Relative: 0 %
HCT: 36.8 % — ABNORMAL LOW (ref 39.0–52.0)
Hemoglobin: 11.7 g/dL — ABNORMAL LOW (ref 13.0–17.0)
Immature Granulocytes: 1 %
Lymphocytes Relative: 12 %
Lymphs Abs: 1 K/uL (ref 0.7–4.0)
MCH: 28.3 pg (ref 26.0–34.0)
MCHC: 31.8 g/dL (ref 30.0–36.0)
MCV: 89.1 fL (ref 80.0–100.0)
Monocytes Absolute: 1 K/uL (ref 0.1–1.0)
Monocytes Relative: 11 %
Neutro Abs: 6.9 K/uL (ref 1.7–7.7)
Neutrophils Relative %: 76 %
Platelets: 232 K/uL (ref 150–400)
RBC: 4.13 MIL/uL — ABNORMAL LOW (ref 4.22–5.81)
RDW: 15.7 % — ABNORMAL HIGH (ref 11.5–15.5)
WBC: 9.1 K/uL (ref 4.0–10.5)
nRBC: 0 % (ref 0.0–0.2)

## 2023-09-06 NOTE — Progress Notes (Addendum)
 I was present with the medical student for this service. I personally verified the history of present illness, performed the physical exam, and made the plan for this encounter. I have verified the medical student's documentation and made modifications where appropriately. I have personally documented in my own words a brief history, physical, and plan below.     Doing well. No blood but no BM. Pain doing well. Sitz baths going well.   Needs to have Bm. Will see if having bleeding. If no bleeding then would think could start anticoagulation back. Sitz baths.   Nathan Pander, MD [facility] [address] [place], Nathan Shepherd [phone] (office)   [facility] Progress Note  1 Day Post-Op  Subjective: Nathan Shepherd is a 68 yo M with a history of Afib and hemorrhoid prolapse/bleeding who is one day post extensive hemorrhoidectomy. On interview, he reports doing well since his surgery yesterday afternoon. He reports eating well after his surgery and resting for most of the day afterwards. He describes sleeping well with minimal pain since his surgery. He endorses good urine output, but denies having had a bowel movement yet. He reports being unsure about any rectal bleeding as he has not checked since the surgery. He denies any other symptoms including fevers, chills, diaphoresis, nausea, vomiting, or abdominal pain. He reports no other symptoms today.  Objective: Vital signs in last 24 hours: Temp:  [97.4 F (36.3 C)-98.5 F (36.9 C)] 98.4 F (36.9 C) (09/03 0735) Pulse Rate:  [59-80] 80 (09/03 0735) Resp:  [13-22] 18 (09/03 0735) BP: (109-159)/(81-111) 159/111 (09/03 0735) SpO2:  [93 %-100 %] 98 % (09/03 0735) Weight:  [80.1 kg] 80.1 kg (09/02 1158) Last BM Date : 09/04/23  Intake/Output from previous day: 09/02 0701 - 09/03 0700 In: 500 [I.V.:500] Out: 1900 [Urine:1900] Intake/Output this shift: No intake/output data  recorded.  General appearance: alert and no distress Resp: clear to auscultation bilaterally Cardio: regular rate and rhythm, S1, S2 normal, no murmur, click, rub or gallop GI: normal findings: bowel sounds normal, no masses palpable, and soft, non-tender  Lab Results:  Recent Labs    09/05/23 0437 09/06/23 0440  WBC 6.8 9.1  HGB 10.1* 11.7*  HCT 32.0* 36.8*  PLT 191 232   BMET Recent Labs    09/04/23 0910 09/05/23 0437  NA 142 141  K 3.2* 3.6  CL 102 102  CO2 28 32  GLUCOSE 84 95  BUN 17 20  CREATININE 1.30* 1.34*  CALCIUM  8.5* 9.0   PT/INR Recent Labs    09/04/23 0910 09/05/23 0437  LABPROT 69.7* 22.0*  INR 8.0* 1.8*    Studies/Results: No results found.  Anti-infectives: Anti-infectives (From admission, onward)    None       Assessment/Plan: s/p Procedure(s): EXTENSIVE HEMORRHOIDECTOMY  Nathan Shepherd is a 68 yo M with a history of Afib and hemorrhoid prolapse s/p extensive hemorrhoidectomy who presents well after his surgery yesterday afternoon. He reports no new concerns, pain, bleeding, fevers, or chills. He denies having had a bowel movement since surgery. His most recent H&H showed improvement. - Continue to monitor recovery from hemorrhoidectomy, emphasizing sitz baths every 4 hours and after bowel movements - Continue to monitor for bowel movement and associated pain/bleeding - Discuss restarting warfarin with patient and hospitalist  Plan for discharge tomorrow if meeting goals listed above   LOS: 1 day    Nathan Shepherd 09/06/2023

## 2023-09-06 NOTE — Plan of Care (Signed)

## 2023-09-06 NOTE — Plan of Care (Signed)
  Problem: Acute Rehab PT Goals(only PT should resolve) Goal: Pt Will Go Supine/Side To Sit Outcome: Progressing Flowsheets (Taken 09/06/2023 1202) Pt will go Supine/Side to Sit: with supervision Goal: Patient Will Transfer Sit To/From Stand Outcome: Progressing Flowsheets (Taken 09/06/2023 1202) Patient will transfer sit to/from stand: with contact guard assist Goal: Pt Will Transfer Bed To Chair/Chair To Bed Outcome: Progressing Flowsheets (Taken 09/06/2023 1202) Pt will Transfer Bed to Chair/Chair to Bed: with supervision Goal: Pt Will Ambulate Outcome: Progressing Flowsheets (Taken 09/06/2023 1202) Pt will Ambulate:  75 feet  100 feet  with supervision

## 2023-09-06 NOTE — Progress Notes (Signed)
 PROGRESS NOTE    Nathan Shepherd  FMW:[id number] DOB: 1955/07/28 DOA: 09/04/2023 PCP: Wilmon Penton, NP   Brief Narrative:    Nathan Shepherd is a 68 y.o. male with medical history significant for seizures, chronic atrial fibrillation on warfarin, polysubstance abuse, history of CVA, CKD stage IIIa, and supratherapeutic INR who presented to the ED with complaints of rectal bleeding.  Patient was noted to have supratherapeutic INR and underwent reversal with IV and oral vitamin K .  He has stable hemoglobin levels and was seen by general surgery and underwent extensive hemorrhoidectomy on 9/2.  Assessment & Plan:   Principal Problem:   Rectal bleeding Active Problems:   Atrial fibrillation, chronic (HCC) - cannot be on DOAC due to concurrent treatment with Depakote .   Essential hypertension   Seizure (HCC) - cannot be on DOAC due to concurrent treatment with Depakote .   Mixed hyperlipidemia   History of CVA (cerebrovascular accident) - not on DOAC due to interaction with AEDs   Anemia   Elevated INR   Rectal prolapse   Hemorrhoid prolapse   Grade IV hemorrhoids  Assessment and Plan:  1)Rectal bleeding from hemorrhoids secondary to supratherapeutic INR-resolved, status post extensive hemorrhoidectomy 09/05/23 - Noted to be on Coumadin  for atrial fibrillation and cannot use DOAC due to Depakote  use 09/06/23--- staff reports scant bloody smear---  - Patient will require Lovenox  bridging on discharge -Restart Lovenox  bridge when okay with general surgeon   Essential hypertension - Continue amlodipine , Coreg , losartan    Chronic atrial fibrillation - Cannot be on DOAC due to concurrent treatment with Depakote  - Continue Coreg  --Anticoagulation as above #1   History of CVA/dyslipidemia - Crestor  and anticoagulation as above  Seizure - Keppra  1000 mg twice daily and Depakote     DVT prophylaxis: SCDs Code Status: Full Family Communication: Previously discussed with  legal guardian and followed by general surgery 9/2 Disposition Plan: Back to ALF 1 to 2 days if no significant rectal bleed Status is: Inpatient Remains inpatient appropriate because: Need for IV medications.  Consultants:  General surgery  Procedures:  Extensive hemorrhoidectomy 09/05/23  Antimicrobials:  None  Subjective: staff reports scant bloody smear--otherwise no significant bleeding - Tolerating oral intake well   Objective: Vitals:   09/06/23 0528 09/06/23 0735 09/06/23 0900 09/06/23 1320  BP: (!) 130/93 (!) 159/111 95/72 110/71  Pulse: 75 80 71 78  Resp: 20 18 18 18   Temp: (!) 97.4 F (36.3 C) 98.4 F (36.9 C) 98.1 F (36.7 C) 98.2 F (36.8 C)  TempSrc: Oral Oral Oral Oral  SpO2: 99% 98% 99% 97%  Weight:      Height:        Intake/Output Summary (Last 24 hours) at 09/06/2023 1742 Last data filed at 09/06/2023 1300 Gross per 24 hour  Intake 360 ml  Output 1100 ml  Net -740 ml   Filed Weights   09/04/23 0857 09/04/23 1158 09/05/23 1158  Weight: 88.5 kg 80.1 kg 80.1 kg    Physical Exam  Gen:- Awake Alert, in no acute distress  HEENT:- [place].AT, No sclera icterus Neck-Supple Neck,No JVD,.  Lungs-  CTAB , fair air movement bilaterally  CV- S1, S2 normal, RRR Abd-  +ve B.Sounds, Abd Soft, No tenderness,    Extremity/Skin:- No  edema,   good pedal pulses  Psych-affect is appropriate, oriented x3 Neuro-generalized weakness, no new focal deficits, no tremors Rectum--staff reports scant bloody smear  Data Reviewed: I have personally reviewed following labs and imaging studies  CBC: Recent  Labs  Lab 09/04/23 0910 09/05/23 0437 09/06/23 0440  WBC 5.3 6.8 9.1  NEUTROABS 3.5  --  6.9  HGB 10.6* 10.1* 11.7*  HCT 33.4* 32.0* 36.8*  MCV 88.4 88.2 89.1  PLT 177 191 232   Basic Metabolic Panel: Recent Labs  Lab 09/04/23 0910 09/05/23 0437  NA 142 141  K 3.2* 3.6  CL 102 102  CO2 28 32  GLUCOSE 84 95  BUN 17 20  CREATININE 1.30* 1.34*  CALCIUM  8.5*  9.0  MG  --  1.7   GFR: Estimated Creatinine Clearance: 62.3 mL/min (A) (by C-G formula based on SCr of 1.34 mg/dL (H)). Coagulation Profile: Recent Labs  Lab 09/04/23 0910 09/05/23 0437  INR 8.0* 1.8*    Scheduled Meds:  carvedilol   12.5 mg Oral BID WC   divalproex   750 mg Oral Q12H   docusate sodium   100 mg Oral BID   donepezil   5 mg Oral QHS   feeding supplement  237 mL Oral BID BM   fluticasone  furoate-vilanterol  1 puff Inhalation Daily   levETIRAcetam   1,000 mg Oral BID   losartan   50 mg Oral Daily   NIFEdipine   60 mg Oral Daily   rosuvastatin   20 mg Oral QHS   torsemide   20 mg Oral Daily   Continuous Infusions:  Rendall Carwin, MD [facility]  If 7PM-7AM, please contact night-coverage [email or web address] 09/06/2023, 5:42 PM

## 2023-09-06 NOTE — Plan of Care (Signed)
  Problem: Acute Rehab OT Goals (only OT should resolve) Goal: Pt. Will Perform Grooming Flowsheets (Taken 09/06/2023 0959) Pt Will Perform Grooming:  Independently  standing Goal: Pt. Will Perform Lower Body Bathing Flowsheets (Taken 09/06/2023 0959) Pt Will Perform Lower Body Bathing: Independently Goal: Pt. Will Perform Lower Body Dressing Flowsheets (Taken 09/06/2023 0959) Pt Will Perform Lower Body Dressing: Independently Goal: Pt. Will Transfer To Toilet Flowsheets (Taken 09/06/2023 [phone]) Pt Will Transfer to Toilet:  Independently  ambulating  Tymel Conely OT, MOT

## 2023-09-06 NOTE — Progress Notes (Signed)
 Sitz bath given.  Pt tolerated it well.

## 2023-09-06 NOTE — Evaluation (Signed)
 Occupational Therapy Evaluation Patient Details Name: Nathan Shepherd MRN: [id number] DOB: 1955/05/19 Today's Date: 09/06/2023   History of Present Illness   Nathan Shepherd is a 68 y.o. male with medical history significant for seizures, chronic atrial fibrillation on warfarin, polysubstance abuse, history of CVA, CKD stage IIIa, and supratherapeutic INR who presented to the ED with complaints of rectal bleeding.  He denies any abdominal pain, nausea, vomiting, diarrhea, or dark stools.  Patient has been taking his medications as otherwise prescribed. (per DO)     Clinical Impressions Pt agreeable to OT and PT co-evaluation. Pt reports living at an ALF where he is independent for ADL's. Today pt required min A to CGA for transfers mostly due to assist to boost from the sitting surface and to lower to bed safely. CGA for bed mobility due to labored effort. Pt required mod A for lower body dressing today seated at the EOB. Likely needs some assist for lower body ADL's at this time. Set up assist for upper body ADL's based on observation. Pt left in the chair with call bell within reach. Pt will benefit from continued OT in the hospital and recommended venue below to increase strength, balance, and endurance for safe ADL's.        If plan is discharge home, recommend the following:   A little help with walking and/or transfers;A little help with bathing/dressing/bathroom;Assistance with cooking/housework;Help with stairs or ramp for entrance;Assist for transportation     Functional Status Assessment   Patient has had a recent decline in their functional status and demonstrates the ability to make significant improvements in function in a reasonable and predictable amount of time.     Equipment Recommendations   None recommended by OT             Precautions/Restrictions   Precautions Precautions: Fall Recall of Precautions/Restrictions:  Impaired Restrictions Weight Bearing Restrictions Per Provider Order: No     Mobility Bed Mobility Overal bed mobility: Needs Assistance Bed Mobility: Supine to Sit     Supine to sit: Contact guard     General bed mobility comments: slow, labored movement    Transfers Overall transfer level: Needs assistance   Transfers: Sit to/from Stand, Bed to chair/wheelchair/BSC Sit to Stand: Contact guard assist, Min assist     Step pivot transfers: Contact guard assist     General transfer comment: Very slow and labored movement to boost from EOB without AD. More CGA for steps to chair. Assist to boost from toilet as well.      Balance Overall balance assessment: Needs assistance Sitting-balance support: No upper extremity supported, Feet supported Sitting balance-Leahy Scale: Fair Sitting balance - Comments: fair to good seated at EOB   Standing balance support: No upper extremity supported, During functional activity Standing balance-Leahy Scale: Fair Standing balance comment: without AD                           ADL either performed or assessed with clinical judgement   ADL Overall ADL's : Needs assistance/impaired     Grooming: Set up;Sitting       Lower Body Bathing: Moderate assistance;Sitting/lateral leans   Upper Body Dressing : Set up;Sitting   Lower Body Dressing: Moderate assistance;Sitting/lateral leans Lower Body Dressing Details (indicate cue type and reason): Assist to don sock after he had doffed it seated at EOB. Toilet Transfer: Minimal assistance;Contact guard Marine scientist Details (indicate cue type and reason):  EOB to toilet without AD; labored effort to boost from EOB and min A to boost from toilet with use of L grab bar. Toileting- Clothing Manipulation and Hygiene: Set up;Minimal assistance;Sitting/lateral lean Toileting - Clothing Manipulation Details (indicate cue type and reason): Pt able to compelte peri-care  after bowel movement, but this therapist when over the area lightly with a washcloth as well.     Functional mobility during ADLs: Contact guard assist General ADL Comments: Able to ambulate in the room with CGA without AD.     Vision Baseline Vision/History: 1 Wears glasses Ability to See in Adequate Light: 0 Adequate Patient Visual Report: No change from baseline Vision Assessment?: Wears glasses for reading;No apparent visual deficits     Perception Perception: Not tested       Praxis Praxis: Not tested       Pertinent Vitals/Pain Pain Assessment Pain Assessment: No/denies pain     Extremity/Trunk Assessment Upper Extremity Assessment Upper Extremity Assessment: Overall WFL for tasks assessed   Lower Extremity Assessment Lower Extremity Assessment: Defer to PT evaluation   Cervical / Trunk Assessment Cervical / Trunk Assessment: Kyphotic   Communication Communication Communication: No apparent difficulties   Cognition Arousal: Alert Behavior During Therapy: WFL for tasks assessed/performed Cognition: No apparent impairments                               Following commands: Intact       Cueing  General Comments   Cueing Techniques: Verbal cues;Tactile cues      Exercises     Shoulder Instructions      Home Living Family/patient expects to be discharged to:: Assisted living                             Home Equipment: None          Prior Functioning/Environment Prior Level of Function : Needs assist       Physical Assist : ADLs (physical)   ADLs (physical): IADLs Mobility Comments: Short distance ambulator without AD; stays in the ALF facility. ADLs Comments: Independent ADL; assist IADL    OT Problem List: Decreased activity tolerance;Impaired balance (sitting and/or standing)   OT Treatment/Interventions: Self-care/ADL training;Therapeutic exercise;Therapeutic activities;Patient/family education;Balance  training      OT Goals(Current goals can be found in the care plan section)   Acute Rehab OT Goals Patient Stated Goal: return home OT Goal Formulation: With patient Time For Goal Achievement: 09/20/23 Potential to Achieve Goals: Good   OT Frequency:  Min 2X/week    Co-evaluation PT/OT/SLP Co-Evaluation/Treatment: Yes Reason for Co-Treatment: To address functional/ADL transfers   OT goals addressed during session: ADL's and self-care                       End of Session Equipment Utilized During Treatment: Gait belt  Activity Tolerance: Patient tolerated treatment well Patient left: in chair;with call bell/phone within reach  OT Visit Diagnosis: Unsteadiness on feet (R26.81);Other abnormalities of gait and mobility (R26.89);Muscle weakness (generalized) (M62.81)                Time: 9186-9161 OT Time Calculation (min): 25 min Charges:  OT General Charges $OT Visit: 1 Visit OT Evaluation $OT Eval Low Complexity: 1 Low  Nathan Shepherd OT, MOT  Nathan Shepherd 09/06/2023, 9:56 AM

## 2023-09-06 NOTE — Evaluation (Signed)
 Physical Therapy Evaluation Patient Details Name: Nathan Shepherd MRN: [id number] DOB: September 08, 1955 Today's Date: 09/06/2023  History of Present Illness  Nathan Shepherd is a 68 y.o. male with medical history significant for seizures, chronic atrial fibrillation on warfarin, polysubstance abuse, history of CVA, CKD stage IIIa, and supratherapeutic INR who presented to the ED with complaints of rectal bleeding.  He denies any abdominal pain, nausea, vomiting, diarrhea, or dark stools.  Patient has been taking his medications as otherwise prescribed.   Clinical Impression  Patient demonstrates slow labored movement for sitting up at bedside but is able to self complete with increased time. Patient demonstrates increased difficulty with transfers from standard height surfaces with excessive forward trunk lean and notable muscle quivering in legs requiring minA to boost into full standing, indicating LE and core muscle weakness. Patient ambulates with marked kyphotic posture contributing to forward flexed trunk during gait. Patient slightly unsteady, however no significant fall risk noted at this time. Patient currently functionally limited mostly to LE weakness and poor endurance. Patient will benefit from continued skilled physical therapy in hospital and recommended venue below to increase strength, balance, endurance for safe ADLs and gait.        If plan is discharge home, recommend the following: A little help with walking and/or transfers;A little help with bathing/dressing/bathroom;Help with stairs or ramp for entrance;Assist for transportation   Can travel by private vehicle        Equipment Recommendations None recommended by PT  Recommendations for Other Services       Functional Status Assessment Patient has had a recent decline in their functional status and demonstrates the ability to make significant improvements in function in a reasonable and predictable amount of time.      Precautions / Restrictions Precautions Precautions: Fall Recall of Precautions/Restrictions: Impaired Restrictions Weight Bearing Restrictions Per Provider Order: No      Mobility  Bed Mobility Overal bed mobility: Needs Assistance Bed Mobility: Supine to Sit     Supine to sit: Contact guard     General bed mobility comments: slow, labored movement    Transfers Overall transfer level: Needs assistance Equipment used: None Transfers: Sit to/from Stand, Bed to chair/wheelchair/BSC Sit to Stand: Min assist, Contact guard assist   Step pivot transfers: Contact guard assist       General transfer comment: Very slow and labored movement to boost from standard height surfaces without AD; Excessive forward trunk lean with STS attempt    Ambulation/Gait Ambulation/Gait assistance: Contact guard assist Gait Distance (Feet): 50 Feet Assistive device: None Gait Pattern/deviations: Trunk flexed, Decreased step length - right, Decreased stride length, Decreased step length - left Gait velocity: Decreased     General Gait Details: Ambulates with marked kyphotic posture, slightly unsteady without AD but observable loss of balance; fair return for abmulating in room and hallway  Stairs            Wheelchair Mobility     Tilt Bed    Modified Rankin (Stroke Patients Only)       Balance Overall balance assessment: Needs assistance Sitting-balance support: No upper extremity supported, Feet supported Sitting balance-Leahy Scale: Fair Sitting balance - Comments: fair to good seated at EOB   Standing balance support: No upper extremity supported, During functional activity Standing balance-Leahy Scale: Fair Standing balance comment: without AD  Pertinent Vitals/Pain Pain Assessment Pain Assessment: No/denies pain    Home Living Family/patient expects to be discharged to:: Assisted living                 Home  Equipment: None      Prior Function Prior Level of Function : Needs assist       Physical Assist : ADLs (physical)   ADLs (physical): IADLs Mobility Comments: Short distance ambulator without AD; stays in the ALF facility. ADLs Comments: Independent ADL; assist IADL     Extremity/Trunk Assessment   Upper Extremity Assessment Upper Extremity Assessment: Defer to OT evaluation    Lower Extremity Assessment Lower Extremity Assessment: Generalized weakness    Cervical / Trunk Assessment Cervical / Trunk Assessment: Kyphotic  Communication   Communication Communication: No apparent difficulties    Cognition Arousal: Alert Behavior During Therapy: WFL for tasks assessed/performed   PT - Cognitive impairments: No apparent impairments                         Following commands: Intact       Cueing Cueing Techniques: Verbal cues, Tactile cues     General Comments      Exercises     Assessment/Plan    PT Assessment Patient needs continued PT services  PT Problem List Decreased strength;Decreased activity tolerance;Decreased balance;Decreased mobility       PT Treatment Interventions DME instruction;Therapeutic activities;Therapeutic exercise;Patient/family education;Balance training;Functional mobility training    PT Goals (Current goals can be found in the Care Plan section)  Acute Rehab PT Goals Patient Stated Goal: return home PT Goal Formulation: With patient Time For Goal Achievement: 09/20/23 Potential to Achieve Goals: Good    Frequency Min 3X/week     Co-evaluation PT/OT/SLP Co-Evaluation/Treatment: Yes Reason for Co-Treatment: To address functional/ADL transfers PT goals addressed during session: Mobility/safety with mobility;Balance OT goals addressed during session: ADL's and self-care       AM-PAC PT 6 Clicks Mobility  Outcome Measure Help needed turning from your back to your side while in a flat bed without using  bedrails?: None Help needed moving from lying on your back to sitting on the side of a flat bed without using bedrails?: A Little Help needed moving to and from a bed to a chair (including a wheelchair)?: A Little Help needed standing up from a chair using your arms (e.g., wheelchair or bedside chair)?: A Little Help needed to walk in hospital room?: A Little Help needed climbing 3-5 steps with a railing? : A Little 6 Click Score: 19    End of Session Equipment Utilized During Treatment: Gait belt Activity Tolerance: Patient tolerated treatment well Patient left: in chair;with call bell/phone within reach Nurse Communication: Mobility status PT Visit Diagnosis: Unsteadiness on feet (R26.81);Muscle weakness (generalized) (M62.81);Other abnormalities of gait and mobility (R26.89)    Time: 9189-9163 PT Time Calculation (min) (ACUTE ONLY): 26 min   Charges:   PT Evaluation $PT Eval Moderate Complexity: 1 Mod PT Treatments $Therapeutic Activity: 23-37 mins PT General Charges $$ ACUTE PT VISIT: 1 Visit        12:02 PM, 09/06/23,  Onnie Como, SPT

## 2023-09-07 ENCOUNTER — Other Ambulatory Visit (HOSPITAL_COMMUNITY): Payer: Self-pay

## 2023-09-07 ENCOUNTER — Telehealth (HOSPITAL_COMMUNITY): Payer: Self-pay | Admitting: Pharmacy Technician

## 2023-09-07 DIAGNOSIS — K625 Hemorrhage of anus and rectum: Secondary | ICD-10-CM | POA: Diagnosis not present

## 2023-09-07 LAB — CBC
HCT: 37.8 % — ABNORMAL LOW (ref 39.0–52.0)
Hemoglobin: 12 g/dL — ABNORMAL LOW (ref 13.0–17.0)
MCH: 28.4 pg (ref 26.0–34.0)
MCHC: 31.7 g/dL (ref 30.0–36.0)
MCV: 89.4 fL (ref 80.0–100.0)
Platelets: 263 K/uL (ref 150–400)
RBC: 4.23 MIL/uL (ref 4.22–5.81)
RDW: 15.8 % — ABNORMAL HIGH (ref 11.5–15.5)
WBC: 10.4 K/uL (ref 4.0–10.5)
nRBC: 0 % (ref 0.0–0.2)

## 2023-09-07 LAB — PROTIME-INR
INR: 1.5 — ABNORMAL HIGH (ref 0.8–1.2)
Prothrombin Time: 18.6 s — ABNORMAL HIGH (ref 11.4–15.2)

## 2023-09-07 LAB — SURGICAL PATHOLOGY

## 2023-09-07 MED ORDER — POLYETHYLENE GLYCOL 3350 17 G PO PACK: 17.0000 g | PACK | ORAL | 5 refills | Status: DC

## 2023-09-07 MED ORDER — WARFARIN SODIUM 10 MG PO TABS
11.2500 mg | ORAL_TABLET | Freq: Once | ORAL | Status: DC
  Filled 2023-09-07: qty 1

## 2023-09-07 MED ORDER — WARFARIN SODIUM 5 MG PO TABS
12.0000 mg | ORAL_TABLET | Freq: Once | ORAL | Status: AC
  Administered 2023-09-07: 12 mg via ORAL
  Filled 2023-09-07: qty 1

## 2023-09-07 MED ORDER — WARFARIN SODIUM 7.5 MG PO TABS: 7.5000 mg | ORAL_TABLET | ORAL | 5 refills | Status: DC

## 2023-09-07 MED ORDER — ENOXAPARIN SODIUM 80 MG/0.8ML IJ SOSY
80.0000 mg | PREFILLED_SYRINGE | Freq: Two times a day (BID) | INTRAMUSCULAR | Status: DC
  Administered 2023-09-07: 80 mg via SUBCUTANEOUS
  Filled 2023-09-07: qty 0.8

## 2023-09-07 MED ORDER — SENNOSIDES-DOCUSATE SODIUM 8.6-50 MG PO TABS: 2.0000 | ORAL_TABLET | Freq: Every day | ORAL | 1 refills | Status: DC

## 2023-09-07 MED ORDER — CARVEDILOL 3.125 MG PO TABS: 3.1250 mg | ORAL_TABLET | Freq: Two times a day (BID) | ORAL | 11 refills | Status: DC

## 2023-09-07 MED ORDER — ONDANSETRON HCL 4 MG PO TABS: 4.0000 mg | ORAL_TABLET | Freq: Four times a day (QID) | ORAL | 0 refills | Status: DC | PRN

## 2023-09-07 MED ORDER — OMEPRAZOLE 20 MG PO CPDR: 20.0000 mg | DELAYED_RELEASE_CAPSULE | Freq: Two times a day (BID) | ORAL | 3 refills | Status: DC

## 2023-09-07 MED ORDER — WARFARIN - PHARMACIST DOSING INPATIENT: Freq: Every day | Status: DC

## 2023-09-07 MED ORDER — SYMBICORT 160-4.5 MCG/ACT IN AERO: 2.0000 | INHALATION_SPRAY | Freq: Two times a day (BID) | RESPIRATORY_TRACT | 5 refills | Status: DC

## 2023-09-07 MED ORDER — ENOXAPARIN SODIUM 80 MG/0.8ML IJ SOSY
1.0000 mg/kg | PREFILLED_SYRINGE | Freq: Two times a day (BID) | INTRAMUSCULAR | 0 refills | Status: DC
Start: 2023-09-07 — End: 2023-09-15

## 2023-09-07 NOTE — Plan of Care (Signed)

## 2023-09-07 NOTE — TOC Transition Note (Signed)
 Transition of Care [facility]) - Discharge Note  Patient Details  Name: Nathan Shepherd MRN: [id number] Date of Birth: [birth year]  Transition of Care [facility]) CM/SW Contact:  Nena LITTIE Coffee, RN Phone Number: 09/07/2023, 3:29 PM  Clinical Narrative:    Pt returning to [facility], [facility] [place] to provide transportation as facility is unable to provide transportation today. Legal guardian and Nathan Shepherd at facility updated. [facility] to provide [facility] PT/OT.   Final next level of care: Assisted Living Barriers to Discharge: Barriers Resolved  Patient Goals and CMS Choice Patient states their goals for this hospitalization and ongoing recovery are:: From ALF CMS Medicare.gov Compare Post Acute Care list provided to:: Patient Represenative (must comment) Choice offered to / list presented to : Patient, [facility] POA / Guardian    Discharge Placement   Discharge Plan and Services Additional resources added to the After Visit Summary for   In-house Referral: Clinical Social Work Discharge Planning Services: CM Consult Post Acute Care Choice: Resumption of Svcs/PTA Provider             HH Arranged: PT, OT [facility] Agency: [facility] Date [facility] Agency Contacted: 09/07/23   Representative spoke with at [facility] Agency: Darice  Social Drivers of Health (SDOH) Interventions SDOH Screenings   Food Insecurity: No Food Insecurity (09/04/2023)  Housing: Low Risk  (09/04/2023)  Transportation Needs: No Transportation Needs (09/04/2023)  Utilities: Not At Risk (09/04/2023)  Social Connections: Moderately Isolated (09/04/2023)  Tobacco Use: Low Risk  (09/05/2023)   Readmission Risk Interventions     No data to display

## 2023-09-07 NOTE — Care Management Important Message (Signed)
 Important Message  Patient Details  Name: Nathan Shepherd MRN: [id number] Date of Birth: 1955-03-10   Important Message Given:  N/A - LOS <3 / Initial given by admissions     Duwaine LITTIE Ada 09/07/2023, 8:39 AM

## 2023-09-07 NOTE — Discharge Summary (Signed)
 Nathan Shepherd, is a 68 y.o. male  DOB [birth year]  MRN [id number].  Admission date:  09/04/2023  Admitting Physician  Pratik JONETTA Fairly, DO  Discharge Date:  09/07/2023   Primary MD  Gammon, Chrystal, NP  Recommendations for primary care physician for things to follow:  1)Take Warfarin 11.25 mg (1.5 tabs of 7.5 mg) x 2 days (09/07/23 and 09/08/23)-- then back to coumadin  7.5 mg daily starting 09/09/23 2)Lovenox  80mg  q12 x 5 days -- 3)Repeat CBC and PT/INR on Saturday 09/09/23  4)Avoid ibuprofen /Advil /Aleve/Motrin Josefine Powders/Naproxen/BC powders/Meloxicam/Diclofenac/Indomethacin and other Nonsteroidal anti-inflammatory medications as these will make you more likely to bleed and can cause stomach ulcers, can also cause Kidney problems.  Admission Diagnosis  Rectal bleeding [K62.5] Supratherapeutic INR [R79.1]   Discharge Diagnosis  Rectal bleeding [K62.5] Supratherapeutic INR [R79.1]    Principal Problem:   Rectal bleeding Active Problems:   Atrial fibrillation, chronic (HCC) - cannot be on DOAC due to concurrent treatment with Depakote .   Essential hypertension   Seizure (HCC) - cannot be on DOAC due to concurrent treatment with Depakote .   Mixed hyperlipidemia   History of CVA (cerebrovascular accident) - not on DOAC due to interaction with AEDs   Anemia   Elevated INR   Rectal prolapse   Hemorrhoid prolapse   Grade IV hemorrhoids      Past Medical History:  Diagnosis Date   CHF (congestive heart failure) (HCC)    CKD (chronic kidney disease) stage 3, GFR 30-59 ml/min (HCC)    Edema    Hyperlipemia    Hypertension    Seizures (HCC)     Past Surgical History:  Procedure Laterality Date   COLONOSCOPY N/A 03/24/2023   Procedure: COLONOSCOPY;  Surgeon: Cinderella [name], MD;  [facility];  Service: Endoscopy;  Laterality: N/A;   ESOPHAGOGASTRODUODENOSCOPY N/A 03/24/2023    Procedure: EGD (ESOPHAGOGASTRODUODENOSCOPY);  Surgeon: Cinderella [name], MD;  Location: [facility] ENDO SU[facility];  Service: Endoscopy;  Laterality: N/A;   HEMORRHOID SURGERY N/A 09/05/2023   Procedure: EXTENSIVE HEMORRHOIDECTOMY;  Surgeon: [name] Manuelita BROCKS, MD;  Location: [facility] ORS;  Service: General;  Laterality: N/A;   NO PAST SURGERIES       HPI  from the history and physical done on the day of admission:   Chief Complaint: Rectal bleeding   HPI: Nathan Shepherd is a 68 y.o. male with medical history significant for seizures, chronic atrial fibrillation on warfarin, polysubstance abuse, history of CVA, CKD stage IIIa, and supratherapeutic INR who presented to the ED with complaints of rectal bleeding.  He denies any abdominal pain, nausea, vomiting, diarrhea, or dark stools.  Patient has been taking his medications as otherwise prescribed.   ED Course: Patient noted to have stable vital signs and given oral dose of vitamin K .  Potassium 3.2 and INR noted to be 8 with hemoglobin 10.6.   Review of Systems: Reviewed as noted above, otherwise negative.   Hospital Course:   Brief Summary:    Nathan Shepherd is a 68 y.o.  male with medical history significant for seizures, chronic atrial fibrillation on warfarin, polysubstance abuse, history of CVA, CKD stage IIIa, and supratherapeutic INR who presented to the ED with complaints of rectal bleeding.  Patient was noted to have supratherapeutic INR and underwent reversal with IV and oral vitamin K .  He has stable hemoglobin levels and was seen by general surgery and underwent extensive hemorrhoidectomy on 09/05/23  A/p 1)Rectal Bleeding from Hemorrhoids secondary to supratherapeutic INR-resolved, status post extensive hemorrhoidectomy 09/05/23 - Noted to be on Coumadin  for atrial fibrillation and cannot use DOAC due to Depakote  use staff reports brown stool-- No Bleeding concerns - Patient will require Lovenox  bridging on discharge--as below (Take  Warfarin 11.25 mg (1.5 tabs of 7.5 mg) x 2 days (09/07/23 and 09/08/23)-- then back to coumadin  7.5 mg daily starting 09/09/23 ---And Lovenox  80mg  q12 x 5 days --Lovenox  Bridge --Repeat CBC and PT/INR on Saturday 09/09/23  C/n Sitz bath   2)HTN - Stop Amlodipine  and Losartan  -reduce coreg  3.125 mg bid   3)Chronic atrial fibrillation - Cannot be on DOAC due to concurrent treatment with Depakote  - Continue Coreg  --Anticoagulation as above #1   4)History of CVA/Dyslipidemia - Crestor  and anticoagulation as above   5)Seizure - Keppra  1000 mg twice daily and Depakote   Discharge Condition: stable  Follow UP   Follow-up Information     [name] Manuelita BROCKS, MD Follow up on 09/19/2023.   Specialty: General Surgery Why: check up Contact information: [address] [place] [facility] [address] [phone]                Consults obtained - Gen surgery  Diet and Activity recommendation:  As advised  Discharge Instructions    Discharge Instructions     Call MD for:  difficulty breathing, headache or visual disturbances   Complete by: As directed    Call MD for:  persistant dizziness or light-headedness   Complete by: As directed    Call MD for:  persistant nausea and vomiting   Complete by: As directed    Call MD for:  temperature >100.4   Complete by: As directed    Diet - low sodium heart healthy   Complete by: As directed    Discharge instructions   Complete by: As directed    1)Take Warfarin 11.25 mg (1.5 tabs of 7.5 mg) x 2 days (09/07/23 and 09/08/23)-- then back to coumadin  7.5 mg daily starting 09/09/23 2)Lovenox  80mg  q12 x 5 days -- 3)Repeat CBC and PT/INR on Saturday 09/09/23  4)Avoid ibuprofen /Advil /Aleve/Motrin Josefine Powders/Naproxen/BC powders/Meloxicam/Diclofenac/Indomethacin and other Nonsteroidal anti-inflammatory medications as these will make you more likely to bleed and can cause stomach ulcers, can also cause Kidney problems.   Discharge wound  care:   Complete by: As directed    Sitz Bath as advised   Increase activity slowly   Complete by: As directed        Discharge Medications     Allergies as of 09/07/2023       Reactions   Penicillins Hives   Tolerated Zosyn         Medication List     STOP taking these medications    amLODipine  2.5 MG tablet Commonly known as: NORVASC    aspirin  EC 81 MG tablet   Bismuth /Metronidaz/Tetracyclin 140-125-125 MG Caps Commonly known as: Pylera   docusate sodium  100 MG capsule Commonly known as: COLACE   enoxaparin  150 MG/ML injection Commonly known as: LOVENOX  Replaced by: enoxaparin  80 MG/0.8ML injection   guaiFENesin  100 MG/5ML liquid  Commonly known as: ROBITUSSIN   losartan  50 MG tablet Commonly known as: COZAAR    magnesium  hydroxide 400 MG/5ML suspension Commonly known as: MILK OF MAGNESIA   NIFEdipine  60 MG 24 hr tablet Commonly known as: PROCARDIA  XL/NIFEDICAL XL       TAKE these medications    acetaminophen  325 MG tablet Commonly known as: TYLENOL  Take 650 mg by mouth every 4 (four) hours as needed for mild pain (pain score 1-3).   ANTACID PO Take 30 mLs by mouth 4 (four) times daily as needed (heartburn, indigestion). Antacid Suspension   carvedilol  3.125 MG tablet Commonly known as: Coreg  Take 1 tablet (3.125 mg total) by mouth 2 (two) times daily. What changed:  medication strength how much to take when to take this   diphenhydrAMINE  25 MG tablet Commonly known as: BENADRYL  Take 25 mg by mouth every 6 (six) hours as needed for allergies or itching.   divalproex  250 MG DR tablet Commonly known as: DEPAKOTE  Take 3 tablets (750 mg total) by mouth every 12 (twelve) hours.   donepezil  5 MG tablet Commonly known as: ARICEPT  Take 5 mg by mouth at bedtime.   enoxaparin  80 MG/0.8ML injection Commonly known as: LOVENOX  Inject 0.8 mLs (80 mg total) into the skin 2 (two) times daily for 5 days. Replaces: enoxaparin  150 MG/ML injection    eucerin lotion Apply 1 Application topically daily. (Apply to legs and feet)   feeding supplement Liqd Take 237 mLs by mouth 2 (two) times daily between meals.   fluocinolone 0.01 % cream Apply 1 application  topically 3 (three) times daily as needed (bug bites/rash).   levETIRAcetam  1000 MG tablet Commonly known as: KEPPRA  Take 1,000 mg by mouth 2 (two) times daily.   loperamide  2 MG capsule Commonly known as: IMODIUM  Take 1 capsule (2 mg total) by mouth as needed for diarrhea or loose stools. What changed: additional instructions   omeprazole  20 MG capsule Commonly known as: PRILOSEC Take 1 capsule (20 mg total) by mouth 2 (two) times daily before a meal. Take 30 min before breakfast and 30 min before dinner   ondansetron  4 MG tablet Commonly known as: ZOFRAN  Take 1 tablet (4 mg total) by mouth every 6 (six) hours as needed for nausea.   polyethylene glycol 17 g packet Commonly known as: MiraLax  Take 17 g by mouth every Monday, Wednesday, and Friday. Start taking on: September 08, 2023   rosuvastatin  20 MG tablet Commonly known as: CRESTOR  Take 20 mg by mouth at bedtime.   senna-docusate 8.6-50 MG tablet Commonly known as: Senokot-S Take 2 tablets by mouth at bedtime.   Symbicort  160-4.5 MCG/ACT inhaler Generic drug: budesonide -formoterol  Inhale 2 puffs into the lungs in the morning and at bedtime.   torsemide  20 MG tablet Commonly known as: DEMADEX  Take 20 mg by mouth daily.   Vitamin D  (Ergocalciferol ) 1.25 MG (50000 UNIT) Caps capsule Commonly known as: DRISDOL  Take 50,000 Units by mouth every Monday.   warfarin 7.5 MG tablet Commonly known as: COUMADIN  Take 1 tablet (7.5 mg total) by mouth See admin instructions. Warfarin 11.25 mg (1.5 tabs) x 2 days (09/07/23 and 09/08/23)-- then back to coumadin  7.5 mg daily starting 09/09/23 What changed:  when to take this additional instructions               Discharge Care Instructions  (From admission,  onward)           Start     Ordered   09/07/23 0000  Discharge wound care:       Comments: Sitz Bath as advised   09/07/23 1347           Today   Subjective    Matilda Rakes today has no new concerns No fever  Or chills   No Nausea, Vomiting or Diarrhea   staff reports brown stool-- No Bleeding concerns   Patient has been seen and examined prior to discharge   Objective   Blood pressure 109/76, pulse 74, temperature 98.3 F (36.8 C), temperature source Oral, resp. rate 18, height 6' 4 (1.93 m), weight 80.1 kg, SpO2 97%.   Intake/Output Summary (Last 24 hours) at 09/07/2023 1349 Last data filed at 09/07/2023 1100 Gross per 24 hour  Intake 600 ml  Output 1450 ml  Net -850 ml   Exam Gen:- Awake Alert, no acute distress  HEENT:- [place].AT, No sclera icterus Neck-Supple Neck,No JVD,.  Lungs-  CTAB , good air movement bilaterally CV- S1, S2 normal, irregularly, irregular Abd-  +ve B.Sounds, Abd Soft, No tenderness,    Extremity/Skin:- No  edema, good pulses Psych-affect is appropriate, oriented x3 Neuro-Generalized weakness, no new focal deficits, no tremors  Rectum--staff reports brown stool   Data Review   CBC w Diff:  Lab Results  Component Value Date   WBC 10.4 09/07/2023   HGB 12.0 (L) 09/07/2023   HCT 37.8 (L) 09/07/2023   PLT 263 09/07/2023   LYMPHOPCT 12 09/06/2023   MONOPCT 11 09/06/2023   EOSPCT 0 09/06/2023   BASOPCT 0 09/06/2023   CMP:  Lab Results  Component Value Date   NA 141 09/05/2023   K 3.6 09/05/2023   CL 102 09/05/2023   CO2 32 09/05/2023   BUN 20 09/05/2023   CREATININE 1.34 (H) 09/05/2023   PROT 8.0 07/25/2023   ALBUMIN 3.6 07/25/2023   BILITOT 0.8 07/25/2023   ALKPHOS 65 07/25/2023   AST 22 07/25/2023   ALT 17 07/25/2023   Total Discharge time is about 33 minutes  Rendall Carwin M.D on 09/07/2023 at 1:49 PM  Go to [email or web address] -  for contact info  [facility] - Office  [phone]

## 2023-09-07 NOTE — Plan of Care (Signed)
  Problem: Education: Goal: Knowledge of General Education information will improve Description: Including pain rating scale, medication(s)/side effects and non-pharmacologic comfort measures 09/07/2023 1038 by Mathews Norleen POUR, RN Outcome: Adequate for Discharge 09/07/2023 0756 by Mathews Norleen POUR, RN Outcome: Progressing   Problem: Health Behavior/Discharge Planning: Goal: Ability to manage health-related needs will improve 09/07/2023 1038 by Mathews Norleen POUR, RN Outcome: Adequate for Discharge 09/07/2023 0756 by Mathews Norleen POUR, RN Outcome: Progressing   Problem: Clinical Measurements: Goal: Ability to maintain clinical measurements within normal limits will improve 09/07/2023 1038 by Mathews Norleen POUR, RN Outcome: Adequate for Discharge 09/07/2023 0756 by Mathews Norleen POUR, RN Outcome: Progressing Goal: Will remain free from infection 09/07/2023 1038 by Mathews Norleen POUR, RN Outcome: Adequate for Discharge 09/07/2023 0756 by Mathews Norleen POUR, RN Outcome: Progressing Goal: Diagnostic test results will improve 09/07/2023 1038 by Mathews Norleen POUR, RN Outcome: Adequate for Discharge 09/07/2023 0756 by Mathews Norleen POUR, RN Outcome: Progressing Goal: Respiratory complications will improve 09/07/2023 1038 by Mathews Norleen POUR, RN Outcome: Adequate for Discharge 09/07/2023 0756 by Mathews Norleen POUR, RN Outcome: Progressing Goal: Cardiovascular complication will be avoided 09/07/2023 1038 by Mathews Norleen POUR, RN Outcome: Adequate for Discharge 09/07/2023 0756 by Mathews Norleen POUR, RN Outcome: Progressing   Problem: Activity: Goal: Risk for activity intolerance will decrease 09/07/2023 1038 by Mathews Norleen POUR, RN Outcome: Adequate for Discharge 09/07/2023 0756 by Mathews Norleen POUR, RN Outcome: Progressing   Problem: Nutrition: Goal: Adequate nutrition will be maintained 09/07/2023 1038 by Mathews Norleen POUR, RN Outcome: Adequate for Discharge 09/07/2023 0756 by Mathews Norleen POUR, RN Outcome: Progressing   Problem: Coping: Goal: Level of anxiety will  decrease 09/07/2023 1038 by Mathews Norleen POUR, RN Outcome: Adequate for Discharge 09/07/2023 0756 by Mathews Norleen POUR, RN Outcome: Progressing   Problem: Elimination: Goal: Will not experience complications related to bowel motility 09/07/2023 1038 by Mathews Norleen POUR, RN Outcome: Adequate for Discharge 09/07/2023 0756 by Mathews Norleen POUR, RN Outcome: Progressing Goal: Will not experience complications related to urinary retention 09/07/2023 1038 by Mathews Norleen POUR, RN Outcome: Adequate for Discharge 09/07/2023 0756 by Mathews Norleen POUR, RN Outcome: Progressing   Problem: Pain Managment: Goal: General experience of comfort will improve and/or be controlled 09/07/2023 1038 by Mathews Norleen POUR, RN Outcome: Adequate for Discharge 09/07/2023 0756 by Mathews Norleen POUR, RN Outcome: Progressing   Problem: Safety: Goal: Ability to remain free from injury will improve 09/07/2023 1038 by Mathews Norleen POUR, RN Outcome: Adequate for Discharge 09/07/2023 0756 by Mathews Norleen POUR, RN Outcome: Progressing   Problem: Skin Integrity: Goal: Risk for impaired skin integrity will decrease 09/07/2023 1038 by Mathews Norleen POUR, RN Outcome: Adequate for Discharge 09/07/2023 0756 by Mathews Norleen POUR, RN Outcome: Progressing

## 2023-09-07 NOTE — Progress Notes (Addendum)
 PHARMACY - ANTICOAGULATION CONSULT NOTE  Pharmacy Consult for lovenox >warfarin  Indication: atrial fibrillation  Allergies  Allergen Reactions   Penicillins Hives    Tolerated Zosyn     Patient Measurements: Height: 6' 4 (193 cm) Weight: 80.1 kg (176 lb 9.4 oz) IBW/kg (Calculated) : 86.8 HEPARIN  DW (KG): 80.1  Vital Signs: Temp: 98.8 F (37.1 C) (09/04 0340) Temp Source: Oral (09/04 0340) BP: 118/83 (09/04 0340) Pulse Rate: 88 (09/04 0340)  Labs: Recent Labs    09/05/23 0437 09/06/23 0440 09/07/23 1043  HGB 10.1* 11.7* 12.0*  HCT 32.0* 36.8* 37.8*  PLT 191 232 263  LABPROT 22.0*  --  18.6*  INR 1.8*  --  1.5*  CREATININE 1.34*  --   --     Estimated Creatinine Clearance: 61.4 mL/min (A) (by C-G formula based on SCr of 1.34 mg/dL (H)).   Medical History: Past Medical History:  Diagnosis Date   CHF (congestive heart failure) (HCC)    CKD (chronic kidney disease) stage 3, GFR 30-59 ml/min (HCC)    Edema    Hyperlipemia    Hypertension    Seizures (HCC)    Assessment: 68 year old male with history of afib on chronic warfarin. INR was 8 on admit and then down to 1.8 on 9/2 prior to surgery after vitamin k  was given. Warfarin has been held this admit.   New orders to bridge patient back to warfarin with lovenox . Hemoglobin has remained stable 10-11s. Patient is s/p hemorrhoidectomy on 9/2.   Addendum: INR 1.5  Goal of Therapy:  INR 2-3 Monitor platelets by anticoagulation protocol: Yes   Plan:  Warfarin 12mg  today then 11.25 (1.5 tabs) tomorrow- will give dose this am Lovenox  80mg  q12 hours - will give dose this am (copay $1.60) x 10syringes Have patient follow up INR early next week  Dempsey Blush PharmD., BCPS Clinical Pharmacist 09/07/2023 11:35 AM

## 2023-09-07 NOTE — Telephone Encounter (Signed)
 Patient Product/process development scientist completed.    The patient is insured through [employer]. Patient has Medicare and is not eligible for a copay card, but may be able to apply for patient assistance or Medicare RX Payment Plan (Patient Must reach out to their plan, if eligible for payment plan), if available.    Ran test claim for enoxaparin  (Lovenox ) 80 mg/0.8 ml and the current 30 day co-pay is $1.60.   This test claim was processed through [facility]- copay amounts may vary at other pharmacies due to pharmacy/plan contracts, or as the patient moves through the different stages of their insurance plan.     Reyes Sharps, CPHT Pharmacy Technician III Certified Patient Advocate [facility] Patient Advocate Team Direct Number: [phone]  Fax: [phone]

## 2023-09-07 NOTE — Progress Notes (Signed)
 [facility] Progress Note  2 Days Post-Op  Subjective: Nathan Shepherd is a 68 yo M with a history of Afib and hemorrhoid prolapse who is two days post extensive hemorrhoidectomy. On interview, he reports doing well. No acute events since yesterday. He reports continuing to eat and drink well. He endorses resting for most of his time since surgery. He states that he has also been sleeping well. He denies any significant pain since his surgery and endorses good urine output. He reports having had one bowel movement so far, consistent with his previous bowel movements prior to surgery. He noticed a small amount of blood, but denies noticing any other bleeding in the bowl or sheets. He denies any other symptoms such as fever, chills, diaphoresis, nausea, vomiting, or abdominal pain. He reports no other symptoms or concerns today.  Objective: Vital signs in last 24 hours: Temp:  [98.1 F (36.7 C)-100.2 F (37.9 C)] 98.8 F (37.1 C) (09/04 0340) Pulse Rate:  [71-90] 88 (09/04 0340) Resp:  [18] 18 (09/04 0340) BP: (95-159)/(71-111) 118/83 (09/04 0340) SpO2:  [97 %-99 %] 99 % (09/04 0340) Last BM Date : 09/06/23  Intake/Output from previous day: 09/03 0701 - 09/04 0700 In: 960 [P.O.:960] Out: 1300 [Urine:1300] Intake/Output this shift: No intake/output data recorded.  General appearance: alert and no distress Resp: clear to auscultation bilaterally Cardio: irregularly irregular rhythm, S1, S2 normal, no click, and no rub GI: soft, non-tender; bowel sounds normal; no masses,  no organomegaly  Lab Results:  Recent Labs    09/05/23 0437 09/06/23 0440  WBC 6.8 9.1  HGB 10.1* 11.7*  HCT 32.0* 36.8*  PLT 191 232   BMET Recent Labs    09/04/23 0910 09/05/23 0437  NA 142 141  K 3.2* 3.6  CL 102 102  CO2 28 32  GLUCOSE 84 95  BUN 17 20  CREATININE 1.30* 1.34*  CALCIUM  8.5* 9.0   PT/INR Recent Labs    09/04/23 0910 09/05/23 0437  LABPROT 69.7* 22.0*  INR  8.0* 1.8*    Studies/Results: No results found.  Anti-infectives: Anti-infectives (From admission, onward)    None       Assessment/Plan: s/p Procedure(s): EXTENSIVE HEMORRHOIDECTOMY  Nathan Shepherd is a 68 yo M with a history of Afib and hemorrhoid prolapse two days post extensive hemorrhoidectomy who presents well since his surgery. He continues to report no new pain, bleeding, fevers, chills, or concerns. He has since had a bowel movement with small blood passage. His last H&H showed improvement since surgery. Internal medicine has continued to follow the patient and recommended restarting his blood thinners whenever cleared by general surgery. Patient appears to be in a stable state ready for discharge soon. - Continue to monitor hemorrhoidectomy recovery, emphasizing sitz bath every 4 hours and after bowel movements - Continue to monitor rectal bleeding and bleeding associated with bowel movements - Restart patient's warfarin given stabilization of bleeding since surgery  Discharge today if meeting goals listed above.   LOS: 2 days    Nathan Shepherd 09/07/2023

## 2023-09-08 ENCOUNTER — Other Ambulatory Visit: Payer: Self-pay

## 2023-09-08 ENCOUNTER — Encounter (HOSPITAL_COMMUNITY): Payer: Self-pay | Admitting: Emergency Medicine

## 2023-09-08 ENCOUNTER — Emergency Department (HOSPITAL_COMMUNITY)

## 2023-09-08 ENCOUNTER — Inpatient Hospital Stay (HOSPITAL_COMMUNITY)
Admission: EM | Admit: 2023-09-08 | Discharge: 2023-09-15 | DRG: 872 | Disposition: A | Discharge status: Skilled Nursing Facility | Source: Skilled Nursing Facility | Attending: Family Medicine | Admitting: Family Medicine

## 2023-09-08 DIAGNOSIS — I4510 Unspecified right bundle-branch block: Secondary | ICD-10-CM | POA: Diagnosis present

## 2023-09-08 DIAGNOSIS — K921 Melena: Secondary | ICD-10-CM | POA: Diagnosis present

## 2023-09-08 DIAGNOSIS — R569 Unspecified convulsions: Secondary | ICD-10-CM

## 2023-09-08 DIAGNOSIS — N1831 Chronic kidney disease, stage 3a: Secondary | ICD-10-CM | POA: Diagnosis present

## 2023-09-08 DIAGNOSIS — E782 Mixed hyperlipidemia: Secondary | ICD-10-CM | POA: Diagnosis present

## 2023-09-08 DIAGNOSIS — Z7951 Long term (current) use of inhaled steroids: Secondary | ICD-10-CM

## 2023-09-08 DIAGNOSIS — R4189 Other symptoms and signs involving cognitive functions and awareness: Secondary | ICD-10-CM | POA: Diagnosis present

## 2023-09-08 DIAGNOSIS — R400 Somnolence: Secondary | ICD-10-CM | POA: Diagnosis present

## 2023-09-08 DIAGNOSIS — Z880 Allergy status to penicillin: Secondary | ICD-10-CM

## 2023-09-08 DIAGNOSIS — N390 Urinary tract infection, site not specified: Secondary | ICD-10-CM | POA: Diagnosis present

## 2023-09-08 DIAGNOSIS — R531 Weakness: Secondary | ICD-10-CM | POA: Diagnosis not present

## 2023-09-08 DIAGNOSIS — B9620 Unspecified Escherichia coli [E. coli] as the cause of diseases classified elsewhere: Secondary | ICD-10-CM | POA: Diagnosis present

## 2023-09-08 DIAGNOSIS — E875 Hyperkalemia: Secondary | ICD-10-CM | POA: Diagnosis present

## 2023-09-08 DIAGNOSIS — Z79899 Other long term (current) drug therapy: Secondary | ICD-10-CM

## 2023-09-08 DIAGNOSIS — I482 Chronic atrial fibrillation, unspecified: Secondary | ICD-10-CM | POA: Diagnosis not present

## 2023-09-08 DIAGNOSIS — Z8673 Personal history of transient ischemic attack (TIA), and cerebral infarction without residual deficits: Secondary | ICD-10-CM

## 2023-09-08 DIAGNOSIS — G40909 Epilepsy, unspecified, not intractable, without status epilepticus: Secondary | ICD-10-CM | POA: Diagnosis present

## 2023-09-08 DIAGNOSIS — M47896 Other spondylosis, lumbar region: Secondary | ICD-10-CM | POA: Diagnosis present

## 2023-09-08 DIAGNOSIS — R509 Fever, unspecified: Secondary | ICD-10-CM

## 2023-09-08 DIAGNOSIS — Z7901 Long term (current) use of anticoagulants: Secondary | ICD-10-CM

## 2023-09-08 DIAGNOSIS — A4151 Sepsis due to Escherichia coli [E. coli]: Secondary | ICD-10-CM | POA: Diagnosis not present

## 2023-09-08 DIAGNOSIS — I2510 Atherosclerotic heart disease of native coronary artery without angina pectoris: Secondary | ICD-10-CM | POA: Diagnosis present

## 2023-09-08 DIAGNOSIS — K922 Gastrointestinal hemorrhage, unspecified: Secondary | ICD-10-CM

## 2023-09-08 DIAGNOSIS — I130 Hypertensive heart and chronic kidney disease with heart failure and stage 1 through stage 4 chronic kidney disease, or unspecified chronic kidney disease: Secondary | ICD-10-CM | POA: Diagnosis present

## 2023-09-08 DIAGNOSIS — R791 Abnormal coagulation profile: Secondary | ICD-10-CM | POA: Diagnosis present

## 2023-09-08 DIAGNOSIS — N3000 Acute cystitis without hematuria: Secondary | ICD-10-CM

## 2023-09-08 DIAGNOSIS — I10 Essential (primary) hypertension: Secondary | ICD-10-CM | POA: Diagnosis present

## 2023-09-08 DIAGNOSIS — Z9889 Other specified postprocedural states: Secondary | ICD-10-CM

## 2023-09-08 DIAGNOSIS — Z23 Encounter for immunization: Secondary | ICD-10-CM

## 2023-09-08 DIAGNOSIS — D500 Iron deficiency anemia secondary to blood loss (chronic): Secondary | ICD-10-CM | POA: Diagnosis present

## 2023-09-08 LAB — CBC WITH DIFFERENTIAL/PLATELET
Abs Immature Granulocytes: 0.04 K/uL (ref 0.00–0.07)
Basophils Absolute: 0 K/uL (ref 0.0–0.1)
Basophils Relative: 0 %
Eosinophils Absolute: 0 K/uL (ref 0.0–0.5)
Eosinophils Relative: 0 %
HCT: 37.6 % — ABNORMAL LOW (ref 39.0–52.0)
Hemoglobin: 11.9 g/dL — ABNORMAL LOW (ref 13.0–17.0)
Immature Granulocytes: 0 %
Lymphocytes Relative: 8 %
Lymphs Abs: 0.9 K/uL (ref 0.7–4.0)
MCH: 28 pg (ref 26.0–34.0)
MCHC: 31.6 g/dL (ref 30.0–36.0)
MCV: 88.5 fL (ref 80.0–100.0)
Monocytes Absolute: 1 K/uL (ref 0.1–1.0)
Monocytes Relative: 10 %
Neutro Abs: 8.7 K/uL — ABNORMAL HIGH (ref 1.7–7.7)
Neutrophils Relative %: 82 %
Platelets: 273 K/uL (ref 150–400)
RBC: 4.25 MIL/uL (ref 4.22–5.81)
RDW: 15.7 % — ABNORMAL HIGH (ref 11.5–15.5)
WBC: 10.7 K/uL — ABNORMAL HIGH (ref 4.0–10.5)
nRBC: 0 % (ref 0.0–0.2)

## 2023-09-08 LAB — RESP PANEL BY RT-PCR (RSV, FLU A&B, COVID)  RVPGX2
Influenza A by PCR: NEGATIVE
Influenza B by PCR: NEGATIVE
Resp Syncytial Virus by PCR: NEGATIVE
SARS Coronavirus 2 by RT PCR: NEGATIVE

## 2023-09-08 LAB — URINALYSIS, ROUTINE W REFLEX MICROSCOPIC
Bilirubin Urine: NEGATIVE
Glucose, UA: NEGATIVE mg/dL
Hgb urine dipstick: NEGATIVE
Ketones, ur: NEGATIVE mg/dL
Nitrite: POSITIVE — AB
Protein, ur: 30 mg/dL — AB
Specific Gravity, Urine: 1.016 (ref 1.005–1.030)
pH: 7 (ref 5.0–8.0)

## 2023-09-08 LAB — COMPREHENSIVE METABOLIC PANEL WITH GFR
ALT: 9 U/L (ref 0–44)
AST: 13 U/L — ABNORMAL LOW (ref 15–41)
Albumin: 2.4 g/dL — ABNORMAL LOW (ref 3.5–5.0)
Alkaline Phosphatase: 55 U/L (ref 38–126)
Anion gap: 9 (ref 5–15)
BUN: 29 mg/dL — ABNORMAL HIGH (ref 8–23)
CO2: 32 mmol/L (ref 22–32)
Calcium: 9.4 mg/dL (ref 8.9–10.3)
Chloride: 98 mmol/L (ref 98–111)
Creatinine, Ser: 1.43 mg/dL — ABNORMAL HIGH (ref 0.61–1.24)
GFR, Estimated: 54 mL/min — ABNORMAL LOW (ref >60)
Glucose, Bld: 98 mg/dL (ref 70–99)
Potassium: 4.2 mmol/L (ref 3.5–5.1)
Sodium: 139 mmol/L (ref 135–145)
Total Bilirubin: 0.8 mg/dL (ref 0.0–1.2)
Total Protein: 6.6 g/dL (ref 6.5–8.1)

## 2023-09-08 LAB — VITAMIN B12: Vitamin B-12: 760 pg/mL (ref 180–914)

## 2023-09-08 LAB — TYPE AND SCREEN
ABO/RH(D): B POS
Antibody Screen: NEGATIVE

## 2023-09-08 LAB — TSH: TSH: 2.714 u[IU]/mL (ref 0.350–4.500)

## 2023-09-08 LAB — LACTIC ACID, PLASMA: Lactic Acid, Venous: 1.2 mmol/L (ref 0.5–1.9)

## 2023-09-08 LAB — T4, FREE: Free T4: 1.16 ng/dL — ABNORMAL HIGH (ref 0.61–1.12)

## 2023-09-08 LAB — PROTIME-INR
INR: 1.9 — ABNORMAL HIGH (ref 0.8–1.2)
Prothrombin Time: 22.9 s — ABNORMAL HIGH (ref 11.4–15.2)

## 2023-09-08 LAB — CK: Total CK: 76 U/L (ref 49–397)

## 2023-09-08 LAB — TROPONIN I (HIGH SENSITIVITY): Troponin I (High Sensitivity): 13 ng/L (ref <18)

## 2023-09-08 LAB — FOLATE: Folate: 9 ng/mL (ref >5.9)

## 2023-09-08 MED ORDER — ENSURE ENLIVE PO LIQD
237.0000 mL | Freq: Two times a day (BID) | ORAL | Status: DC
  Administered 2023-09-08 – 2023-09-14 (×13): 237 mL via ORAL
  Filled 2023-09-08 (×17): qty 237

## 2023-09-08 MED ORDER — WARFARIN - PHARMACIST DOSING INPATIENT: Freq: Every day | Status: DC

## 2023-09-08 MED ORDER — CARVEDILOL 3.125 MG PO TABS
3.1250 mg | ORAL_TABLET | Freq: Two times a day (BID) | ORAL | Status: DC
  Administered 2023-09-08 – 2023-09-14 (×11): 3.125 mg via ORAL
  Filled 2023-09-08 (×12): qty 1

## 2023-09-08 MED ORDER — ROSUVASTATIN CALCIUM 20 MG PO TABS
20.0000 mg | ORAL_TABLET | Freq: Every day | ORAL | Status: DC
  Administered 2023-09-08 – 2023-09-14 (×7): 20 mg via ORAL
  Filled 2023-09-08 (×7): qty 1

## 2023-09-08 MED ORDER — ONDANSETRON HCL 4 MG PO TABS: 4.0000 mg | ORAL_TABLET | Freq: Four times a day (QID) | ORAL | Status: DC | PRN

## 2023-09-08 MED ORDER — SODIUM CHLORIDE 0.9 % IV SOLN
1.0000 g | INTRAVENOUS | Status: DC
  Administered 2023-09-09 – 2023-09-11 (×3): 1 g via INTRAVENOUS
  Filled 2023-09-08 (×3): qty 10

## 2023-09-08 MED ORDER — POLYETHYLENE GLYCOL 3350 17 G PO PACK
17.0000 g | PACK | ORAL | Status: DC
  Administered 2023-09-08 – 2023-09-13 (×3): 17 g via ORAL
  Filled 2023-09-08 (×4): qty 1

## 2023-09-08 MED ORDER — SENNOSIDES-DOCUSATE SODIUM 8.6-50 MG PO TABS
2.0000 | ORAL_TABLET | Freq: Every day | ORAL | Status: DC
  Administered 2023-09-08 – 2023-09-14 (×7): 2 via ORAL
  Filled 2023-09-08 (×7): qty 2

## 2023-09-08 MED ORDER — FLUTICASONE FUROATE-VILANTEROL 200-25 MCG/ACT IN AEPB
1.0000 | INHALATION_SPRAY | Freq: Every day | RESPIRATORY_TRACT | Status: DC
  Administered 2023-09-09 – 2023-09-15 (×7): 1 via RESPIRATORY_TRACT
  Filled 2023-09-08: qty 28

## 2023-09-08 MED ORDER — ACETAMINOPHEN 650 MG RE SUPP: 650.0000 mg | Freq: Four times a day (QID) | RECTAL | Status: DC | PRN

## 2023-09-08 MED ORDER — ACETAMINOPHEN 325 MG PO TABS
650.0000 mg | ORAL_TABLET | Freq: Four times a day (QID) | ORAL | Status: DC | PRN
Start: 2023-09-08 — End: 2023-09-15
  Administered 2023-09-10 – 2023-09-13 (×3): 650 mg via ORAL
  Filled 2023-09-08 (×5): qty 2

## 2023-09-08 MED ORDER — SODIUM CHLORIDE 0.9 % IV BOLUS
500.0000 mL | Freq: Once | INTRAVENOUS | Status: AC
  Administered 2023-09-08: 500 mL via INTRAVENOUS

## 2023-09-08 MED ORDER — SODIUM CHLORIDE 0.9 % IV SOLN
1.0000 g | Freq: Once | INTRAVENOUS | Status: AC
  Administered 2023-09-08: 1 g via INTRAVENOUS
  Filled 2023-09-08: qty 10

## 2023-09-08 MED ORDER — WARFARIN SODIUM 5 MG PO TABS
10.0000 mg | ORAL_TABLET | Freq: Once | ORAL | Status: AC
  Administered 2023-09-08: 10 mg via ORAL
  Filled 2023-09-08: qty 2

## 2023-09-08 MED ORDER — LACTATED RINGERS IV SOLN: INTRAVENOUS | Status: AC

## 2023-09-08 MED ORDER — DONEPEZIL HCL 5 MG PO TABS
5.0000 mg | ORAL_TABLET | Freq: Every day | ORAL | Status: DC
  Administered 2023-09-08 – 2023-09-14 (×7): 5 mg via ORAL
  Filled 2023-09-08 (×7): qty 1

## 2023-09-08 MED ORDER — ONDANSETRON HCL 4 MG/2ML IJ SOLN: 4.0000 mg | Freq: Four times a day (QID) | INTRAMUSCULAR | Status: DC | PRN

## 2023-09-08 MED ORDER — DIVALPROEX SODIUM 250 MG PO DR TAB
750.0000 mg | DELAYED_RELEASE_TABLET | Freq: Two times a day (BID) | ORAL | Status: AC
  Administered 2023-09-08 – 2023-09-13 (×11): 750 mg via ORAL
  Filled 2023-09-08 (×11): qty 3

## 2023-09-08 MED ORDER — LEVETIRACETAM 500 MG PO TABS
1000.0000 mg | ORAL_TABLET | Freq: Two times a day (BID) | ORAL | Status: DC
Start: 2023-09-08 — End: 2023-09-15
  Administered 2023-09-08 – 2023-09-14 (×13): 1000 mg via ORAL
  Filled 2023-09-08 (×13): qty 2

## 2023-09-08 MED ORDER — VITAMIN D (ERGOCALCIFEROL) 1.25 MG (50000 UNIT) PO CAPS
50000.0000 [IU] | ORAL_CAPSULE | ORAL | Status: DC
  Administered 2023-09-11: 50000 [IU] via ORAL
  Filled 2023-09-08: qty 1

## 2023-09-08 MED ORDER — PANTOPRAZOLE SODIUM 40 MG PO TBEC
40.0000 mg | DELAYED_RELEASE_TABLET | Freq: Every day | ORAL | Status: DC
  Administered 2023-09-09 – 2023-09-14 (×6): 40 mg via ORAL
  Filled 2023-09-08 (×6): qty 1

## 2023-09-08 MED ORDER — DIPHENHYDRAMINE HCL 25 MG PO TABS: 25.0000 mg | ORAL_TABLET | Freq: Four times a day (QID) | ORAL | Status: DC | PRN

## 2023-09-08 NOTE — Evaluation (Signed)
 Physical Therapy Evaluation Patient Details Name: Nathan Shepherd MRN: [id number] DOB: Jun 20, 1955 Today's Date: 09/08/2023  History of Present Illness  Nathan Shepherd is a 62 history of seizure disorder, chronic atrial fibrillation on warfarin, stroke, polysubstance abuse, hypertension, CAD, hyperlipidemia, CKD stage III presenting with generalized weakness.  The patient was recently discharged from the hospital after a stay from 09/04/2023 to 09/07/2023 secondary to rectal bleeding with severe hemorrhoidal.  The patient had a supratherapeutic INR and was given vitamin K  for reversal.  He was subsequently taken to the OR on 09/05/2023 for hemorrhoidectomy by Dr. Morna Pander.  Postoperatively, the patient remained stable and his hemoglobin remained stable.  He was discharged back to his group home.  However, in the morning of 09/08/2023, the patient began difficulty ambulating.  Apparently he was unable to walk to their medicine cabinet to go to get his medications.  The patient states that he has difficulty getting out of bed by himself because of generalized weakness.  He denies any dizziness or syncope.  He states that once he was able to stand up he was so weak he would just fall back onto the bed.  He denies hitting his head.  Because of his generalized weakness and difficulty ambulating, admission was requested.  The patient does endorse dyspnea complaint.  He denies pain.  He denies any fevers, chills, cough, shortness breath, nausea, vomiting, abdominal pain.   Clinical Impression  Patient demonstrates slow labored movement for sitting up at bedside requiring use of bed rail, very unsteady on feet having to lean on armrest of chair during transfers without AD, limited to a few steps at bedside using RW due to c/o fatigue and wanting to eat. Patient tolerated sitting up in chair after therapy - nurse aware. Patient will benefit from continued skilled physical therapy in hospital and recommended  venue below to increase strength, balance, endurance for safe ADLs and gait.           If plan is discharge home, recommend the following: A little help with bathing/dressing/bathroom;Help with stairs or ramp for entrance;Assistance with cooking/housework;A lot of help with walking and/or transfers   Can travel by private vehicle   Yes    Equipment Recommendations Rolling walker (2 wheels)  Recommendations for Other Services       Functional Status Assessment Patient has had a recent decline in their functional status and demonstrates the ability to make significant improvements in function in a reasonable and predictable amount of time.     Precautions / Restrictions Precautions Precautions: Fall Recall of Precautions/Restrictions: Impaired Restrictions Weight Bearing Restrictions Per Provider Order: No      Mobility  Bed Mobility Overal bed mobility: Needs Assistance Bed Mobility: Supine to Sit     Supine to sit: Contact guard, Min assist     General bed mobility comments: slow labored movement having to use bed rail during supine to sitting    Transfers Overall transfer level: Needs assistance Equipment used: None, Rolling walker (2 wheels) Transfers: Sit to/from Stand, Bed to chair/wheelchair/BSC Sit to Stand: Min assist   Step pivot transfers: Min assist       General transfer comment: slow labord movement with most diffiuclty completing sit to stands without AD, required use of RW for safety    Ambulation/Gait Ambulation/Gait assistance: Min assist, Mod assist Gait Distance (Feet): 5 Feet Assistive device: Rolling walker (2 wheels) Gait Pattern/deviations: Trunk flexed, Decreased step length - right, Decreased stride length, Decreased step length -  left Gait velocity: slow     General Gait Details: limited to a few slow labored steps at bedside before having to sit due to c/o fatigue and wanting to eat due to hunger  Stairs             Wheelchair Mobility     Tilt Bed    Modified Rankin (Stroke Patients Only)       Balance Overall balance assessment: Needs assistance Sitting-balance support: Feet supported, No upper extremity supported Sitting balance-Leahy Scale: Fair Sitting balance - Comments: fair/good seated at EOB   Standing balance support: During functional activity, No upper extremity supported Standing balance-Leahy Scale: Poor Standing balance comment: fair/poor using RW                             Pertinent Vitals/Pain Pain Assessment Pain Assessment: No/denies pain    Home Living Family/patient expects to be discharged to:: Assisted living                 Home Equipment: None      Prior Function Prior Level of Function : Needs assist       Physical Assist : ADLs (physical);Mobility (physical) Mobility (physical): Bed mobility;Transfers;Gait;Stairs   Mobility Comments: Short distance ambulator without AD; stays in the ALF facility. ADLs Comments: Independent ADL; assist IADL     Extremity/Trunk Assessment   Upper Extremity Assessment Upper Extremity Assessment: Generalized weakness    Lower Extremity Assessment Lower Extremity Assessment: Generalized weakness    Cervical / Trunk Assessment Cervical / Trunk Assessment: Kyphotic  Communication   Communication Communication: No apparent difficulties    Cognition Arousal: Alert Behavior During Therapy: WFL for tasks assessed/performed, Impulsive                           PT - Cognition Comments: slightly impulsive requiring verbal/tactile cueing for safety Following commands: Intact       Cueing Cueing Techniques: Verbal cues, Tactile cues     General Comments      Exercises     Assessment/Plan    PT Assessment Patient needs continued PT services  PT Problem List Decreased strength;Decreased activity tolerance;Decreased balance;Decreased mobility       PT Treatment  Interventions DME instruction;Therapeutic activities;Therapeutic exercise;Patient/family education;Balance training;Functional mobility training;Stair training;Gait training    PT Goals (Current goals can be found in the Care Plan section)  Acute Rehab PT Goals Patient Stated Goal: return home with ALF staff to assist PT Goal Formulation: With patient Time For Goal Achievement: 09/22/23 Potential to Achieve Goals: Good    Frequency Min 3X/week     Co-evaluation               AM-PAC PT 6 Clicks Mobility  Outcome Measure Help needed turning from your back to your side while in a flat bed without using bedrails?: A Little Help needed moving from lying on your back to sitting on the side of a flat bed without using bedrails?: A Little Help needed moving to and from a bed to a chair (including a wheelchair)?: A Little Help needed standing up from a chair using your arms (e.g., wheelchair or bedside chair)?: A Lot Help needed to walk in hospital room?: A Lot Help needed climbing 3-5 steps with a railing? : A Lot 6 Click Score: 15    End of Session   Activity Tolerance: Patient tolerated treatment well;Patient limited  by fatigue Patient left: in chair;with call bell/phone within reach;with chair alarm set Nurse Communication: Mobility status PT Visit Diagnosis: Unsteadiness on feet (R26.81);Muscle weakness (generalized) (M62.81);Other abnormalities of gait and mobility (R26.89)    Time: 8549-8486 PT Time Calculation (min) (ACUTE ONLY): 23 min   Charges:   PT Evaluation $PT Eval Moderate Complexity: 1 Mod PT Treatments $Therapeutic Activity: 23-37 mins PT General Charges $$ ACUTE PT VISIT: 1 Visit         3:38 PM, 09/08/23 Lynwood Music, MPT Physical Therapist with [facility] [phone] office [phone] mobile phone

## 2023-09-08 NOTE — Progress Notes (Addendum)
 [facility]  H&H stable, some bleeding after hemorrhoid surgery is normal. He reports no pain.  No surgical reason for admission. Continue sitz baths and keep stools soft.     Latest Ref Rng & Units 09/08/2023    9:46 AM 09/07/2023   10:43 AM 09/06/2023    4:40 AM  CBC  WBC 4.0 - 10.5 K/uL 10.7  10.4  9.1   Hemoglobin 13.0 - 17.0 g/dL 88.0  87.9  88.2   Hematocrit 39.0 - 52.0 % 37.6  37.8  36.8   Platelets 150 - 400 K/uL 273  263  232      Future Appointments  Date Time Provider Department Center  09/19/2023 11:15 AM [name] Manuelita BROCKS, MD [facility] None  10/13/2023  8:15 AM Gaynel Delon CROME, DPM [facility]    Manuelita [name], MD [facility] [address] [place], [place] [address] [phone] (office)

## 2023-09-08 NOTE — Plan of Care (Signed)
  Problem: Acute Rehab PT Goals(only PT should resolve) Goal: Pt Will Go Supine/Side To Sit Outcome: Progressing Flowsheets (Taken 09/08/2023 1539) Pt will go Supine/Side to Sit: with supervision Goal: Patient Will Transfer Sit To/From Stand Outcome: Progressing Flowsheets (Taken 09/08/2023 1539) Patient will transfer sit to/from stand: with supervision Goal: Pt Will Transfer Bed To Chair/Chair To Bed Outcome: Progressing Flowsheets (Taken 09/08/2023 1539) Pt will Transfer Bed to Chair/Chair to Bed: with supervision Goal: Pt Will Ambulate Outcome: Progressing Flowsheets (Taken 09/08/2023 1539) Pt will Ambulate:  75 feet  with minimal assist  with rolling walker   3:40 PM, 09/08/23 Lynwood Music, MPT Physical Therapist with [facility] [phone] office [phone] mobile phone

## 2023-09-08 NOTE — ED Provider Notes (Signed)
 [place] EMERGENCY DEPARTMENT AT [facility] Provider Note   CSN: [id number] Arrival date & time: 09/08/23  9089     Patient presents with: Weakness   Nathan Shepherd is a 68 y.o. male.   Patient is a 68 year old male who presents emergency department secondary to generalized weakness and rectal bleeding.  Patient was recently discharged in the hospital secondary to rectal bleeding and is status post hemorrhoidectomy.  His long-term care facility noted that he has had worsening weakness over the past few days and has been unable to ambulate to the area to get his medications daily.  Patient denies any chest pain, shortness of breath, abdominal pain.  He has had no nausea, vomiting, diarrhea.  He denies any dizziness, lightheadedness or syncope.   Weakness      Prior to Admission medications   Medication Sig Start Date End Date Taking? Authorizing Provider  acetaminophen  (TYLENOL ) 325 MG tablet Take 650 mg by mouth every 4 (four) hours as needed for mild pain (pain score 1-3).    [provider]  Calcium  Carbonate Antacid (ANTACID PO) Take 30 mLs by mouth 4 (four) times daily as needed (heartburn, indigestion). Antacid Suspension    [provider]  carvedilol  (COREG ) 3.125 MG tablet Take 1 tablet (3.125 mg total) by mouth 2 (two) times daily. 09/07/23 09/06/24  Pearlean Manus, MD  diphenhydrAMINE  (BENADRYL ) 25 MG tablet Take 25 mg by mouth every 6 (six) hours as needed for allergies or itching.    [provider]  divalproex  (DEPAKOTE ) 250 MG DR tablet Take 3 tablets (750 mg total) by mouth every 12 (twelve) hours. 10/24/22 03/23/23  Jhonny Calvin NOVAK, MD  donepezil  (ARICEPT ) 5 MG tablet Take 5 mg by mouth at bedtime.    [provider]  Emollient (EUCERIN) lotion Apply 1 Application topically daily. (Apply to legs and feet)    [provider]  enoxaparin  (LOVENOX ) 80 MG/0.8ML injection Inject 0.8 mLs (80 mg total) into the  skin 2 (two) times daily for 5 days. 09/07/23 09/12/23  Pearlean Manus, MD  feeding supplement (ENSURE ENLIVE / ENSURE PLUS) LIQD Take 237 mLs by mouth 2 (two) times daily between meals. 03/25/23   Maree, Pratik D, DO  fluocinolone 0.01 % cream Apply 1 application  topically 3 (three) times daily as needed (bug bites/rash).    [provider]  levETIRAcetam  (KEPPRA ) 1000 MG tablet Take 1,000 mg by mouth 2 (two) times daily.    [provider]  loperamide  (IMODIUM ) 2 MG capsule Take 1 capsule (2 mg total) by mouth as needed for diarrhea or loose stools. 05/17/22   Danton Reyes DASEN, MD  omeprazole  (PRILOSEC) 20 MG capsule Take 1 capsule (20 mg total) by mouth 2 (two) times daily before a meal. Take 30 min before breakfast and 30 min before dinner 09/07/23   Pearlean Manus, MD  ondansetron  (ZOFRAN ) 4 MG tablet Take 1 tablet (4 mg total) by mouth every 6 (six) hours as needed for nausea. 09/07/23   Pearlean Manus, MD  polyethylene glycol (MIRALAX ) 17 g packet Take 17 g by mouth every Monday, Wednesday, and Friday. 09/08/23   Pearlean Manus, MD  rosuvastatin  (CRESTOR ) 20 MG tablet Take 20 mg by mouth at bedtime.     [provider]  senna-docusate (SENOKOT-S) 8.6-50 MG tablet Take 2 tablets by mouth at bedtime. 09/07/23 09/06/24  Pearlean Manus, MD  SYMBICORT  160-4.5 MCG/ACT inhaler Inhale 2 puffs into the lungs in the morning and at bedtime.  09/07/23   Pearlean Manus, MD  torsemide  (DEMADEX ) 20 MG tablet Take 20 mg by mouth daily.    [provider]  Vitamin D , Ergocalciferol , (DRISDOL ) 1.25 MG (50000 UNIT) CAPS capsule Take 50,000 Units by mouth every Monday.    [provider]  warfarin (COUMADIN ) 7.5 MG tablet Take 1 tablet (7.5 mg total) by mouth See admin instructions. Warfarin 11.25 mg (1.5 tabs) x 2 days (09/07/23 and 09/08/23)-- then back to coumadin  7.5 mg daily starting 09/09/23 09/07/23   Pearlean Manus, MD    Allergies: Penicillins    Review of  Systems  Neurological:  Positive for weakness.  All other systems reviewed and are negative.   Updated Vital Signs BP (!) 164/119   Pulse 81   Temp (!) 97.5 F (36.4 C) (Oral)   Resp 19   Ht 6' 4 (1.93 m)   Wt 80 kg   SpO2 95%   BMI 21.47 kg/m   Physical Exam Vitals and nursing note reviewed. Exam conducted with a chaperone present.  Constitutional:      General: He is not in acute distress.    Appearance: Normal appearance. He is not ill-appearing.  HENT:     Head: Normocephalic and atraumatic.     Nose: Nose normal.     Mouth/Throat:     Mouth: Mucous membranes are moist.  Eyes:     Extraocular Movements: Extraocular movements intact.     Conjunctiva/sclera: Conjunctivae normal.     Pupils: Pupils are equal, round, and reactive to light.  Cardiovascular:     Rate and Rhythm: Normal rate and regular rhythm.     Pulses: Normal pulses.     Heart sounds: Normal heart sounds. No murmur heard.    No gallop.  Pulmonary:     Effort: Pulmonary effort is normal. No respiratory distress.     Breath sounds: Normal breath sounds. No stridor. No wheezing, rhonchi or rales.  Abdominal:     General: Abdomen is flat. Bowel sounds are normal. There is no distension.     Palpations: Abdomen is soft.     Tenderness: There is no abdominal tenderness. There is no guarding.  Genitourinary:    Rectum: Normal. Guaiac result positive.  Musculoskeletal:        General: Normal range of motion.     Cervical back: Normal range of motion and neck supple.  Skin:    General: Skin is warm and dry.     Findings: No bruising or rash.  Neurological:     General: No focal deficit present.     Mental Status: He is alert and oriented to person, place, and time. Mental status is at baseline.     Cranial Nerves: No cranial nerve deficit.     Sensory: No sensory deficit.     Motor: No weakness.     Coordination: Coordination normal.     Gait: Gait normal.  Psychiatric:        Mood and Affect:  Mood normal.        Behavior: Behavior normal.        Thought Content: Thought content normal.        Judgment: Judgment normal.     (all labs ordered are listed, but only abnormal results are displayed) Labs Reviewed  COMPREHENSIVE METABOLIC PANEL WITH GFR - Abnormal; Notable for the following components:      Result Value   BUN 29 (*)    Creatinine, Ser 1.43 (*)    Albumin  2.4 (*)    AST 13 (*)    GFR, Estimated 54 (*)    All other components within normal limits  CBC WITH DIFFERENTIAL/PLATELET - Abnormal; Notable for the following components:   WBC 10.7 (*)    Hemoglobin 11.9 (*)    HCT 37.6 (*)    RDW 15.7 (*)    Neutro Abs 8.7 (*)    All other components within normal limits  URINALYSIS, ROUTINE W REFLEX MICROSCOPIC - Abnormal; Notable for the following components:   Protein, ur 30 (*)    Nitrite POSITIVE (*)    Leukocytes,Ua MODERATE (*)    Bacteria, UA RARE (*)    All other components within normal limits  PROTIME-INR - Abnormal; Notable for the following components:   Prothrombin Time 22.9 (*)    INR 1.9 (*)    All other components within normal limits  RESP PANEL BY RT-PCR (RSV, FLU A&B, COVID)  [id number]  URINE CULTURE  URINE CULTURE  LACTIC ACID, PLASMA  VITAMIN B12  FOLATE  TSH  T4, FREE  CK  TYPE AND SCREEN  TROPONIN I (HIGH SENSITIVITY)    EKG: None  Radiology: DG Chest Port 1 View Result Date: 09/08/2023 CLINICAL DATA:  weakness EXAM: PORTABLE CHEST - 1 VIEW COMPARISON:  February 22, 2023 FINDINGS: No focal airspace consolidation, pleural effusion, or pneumothorax. Moderate cardiomegaly. Aortic atherosclerosis. No acute fracture or destructive lesions. Multilevel thoracic osteophytosis. IMPRESSION: Redemonstrated cardiomegaly. Otherwise, no acute cardiopulmonary abnormality. Electronically Signed   By: Rogelia Myers M.D.   On: 09/08/2023 11:20     Procedures   Medications Ordered in the ED  carvedilol  (COREG ) tablet 3.125 mg (has no  administration in time range)  rosuvastatin  (CRESTOR ) tablet 20 mg (has no administration in time range)  donepezil  (ARICEPT ) tablet 5 mg (has no administration in time range)  pantoprazole  (PROTONIX ) EC tablet 40 mg (has no administration in time range)  polyethylene glycol (MIRALAX  / GLYCOLAX ) packet 17 g (has no administration in time range)  senna-docusate (Senokot-S) tablet 2 tablet (has no administration in time range)  divalproex  (DEPAKOTE ) DR tablet 750 mg (has no administration in time range)  levETIRAcetam  (KEPPRA ) tablet 1,000 mg (has no administration in time range)  feeding supplement (ENSURE ENLIVE / ENSURE PLUS) liquid 237 mL (has no administration in time range)  Vitamin D  (Ergocalciferol ) (DRISDOL ) 1.25 MG (50000 UNIT) capsule 50,000 Units (has no administration in time range)  fluticasone  furoate-vilanterol (BREO ELLIPTA ) 200-25 MCG/ACT 1 puff (has no administration in time range)  diphenhydrAMINE  (BENADRYL ) tablet 25 mg (has no administration in time range)  acetaminophen  (TYLENOL ) tablet 650 mg (has no administration in time range)    Or  acetaminophen  (TYLENOL ) suppository 650 mg (has no administration in time range)  ondansetron  (ZOFRAN ) tablet 4 mg (has no administration in time range)    Or  ondansetron  (ZOFRAN ) injection 4 mg (has no administration in time range)  cefTRIAXone  (ROCEPHIN ) 1 g in sodium chloride  0.9 % 100 mL IVPB (has no administration in time range)  lactated ringers  infusion (has no administration in time range)  warfarin (COUMADIN ) tablet 10 mg (has no administration in time range)  Warfarin - Pharmacist Dosing Inpatient (has no administration in time range)  sodium chloride  0.9 % bolus 500 mL (500 mLs Intravenous Bolus from Bag 09/08/23 1301)  cefTRIAXone  (ROCEPHIN ) 1 g in sodium chloride  0.9 % 100 mL IVPB (0 g Intravenous Stopped 09/08/23 1348)  Medical Decision Making Amount and/or Complexity of Data  Reviewed Labs: ordered. Radiology: ordered.  Risk Decision regarding hospitalization.   This patient presents to the ED for concern of weakness, this involves an extensive number of treatment options, and is a complaint that carries with it a high risk of complications and morbidity.  The differential diagnosis includes sepsis, pneumonia, urinary tract infection, acute kidney injury, electrolyte derangement, GI bleed, anemia   Co morbidities that complicate the patient evaluation  Recent hemorrhoidectomy   Additional history obtained:  Additional history obtained from medical records External records from outside source obtained and reviewed including records   Lab Tests:  I Ordered, and personally interpreted labs.  The pertinent results include: Mild leukocytosis, anemia at baseline, creatinine at baseline, normal liver function, normal electrolytes, negative troponin, negative lactic acid, urinalysis with positive nitrite and moderate leukocytes, INR of 1.9, negative viral swab   Imaging Studies ordered:  I ordered imaging studies including chest x-ray I independently visualized and interpreted imaging which showed no acute cardiopulmonary process I agree with the radiologist interpretation   Cardiac Monitoring: / EKG:  The patient was maintained on a cardiac monitor.  I personally viewed and interpreted the cardiac monitored which showed an underlying rhythm of: Atrial fibrillation, right bundle branch block, no ST/T wave changes, no ischemic changes, no STEMI   Consultations Obtained:  I requested consultation with the hospitalist,  and discussed lab and imaging findings as well as pertinent plan - they recommend: [place]   Problem List / ED Course / Critical interventions / Medication management  Patient is doing well at this time and does remain stable.  He has no worsening anemia at this point.  Rectal exam demonstrated no active bleeding but he was Hemoccult  positive.  Did discuss patient case with Dr. [name] with general surgery who notes that she does suspect some mild bleeding post hemorrhoidectomy.  Patient does have urinary tract infection and has been started on IV antibiotics.  Given his worsening weakness and inability to ambulate to the area to obtain his medicines daily we will plan for admission to the hospital service.  He does not actively meet sepsis criteria at this point.  Blood work is otherwise unremarkable.  Did discuss patient case with Dr. Evonnie with the hospitalist service who has excepted for admission. I ordered medication including Rocephin , IV fluids for weakness and urinary tract infection Reevaluation of the patient after these medicines showed that the patient improved I have reviewed the patients home medicines and have made adjustments as needed   Social Determinants of Health:  None   Test / Admission - Considered:  Admission     Final diagnoses:  Weakness  Lower urinary tract infectious disease  Gastrointestinal hemorrhage, unspecified gastrointestinal hemorrhage type    ED Discharge Orders     None          Daralene Lonni BIRCH, PA-C 09/08/23 1424    Suzette Pac, MD 09/09/23 1642

## 2023-09-08 NOTE — ED Triage Notes (Signed)
 [facility] bib [facility]. Was d/c home a few days ago from our facility and has had increasing weakness since. Staff reports pt has been unable to walk to their medicine cabinet so he has had no medications x2 days. EMS also reports GI bleed smell

## 2023-09-08 NOTE — Hospital Course (Addendum)
 68y/o history of seizure disorder, chronic atrial fibrillation on warfarin, stroke, polysubstance abuse, hypertension, CAD, hyperlipidemia, CKD stage III presenting with generalized weakness.  The patient was recently discharged from the hospital after a stay from 09/04/2023 to 09/07/2023 secondary to rectal bleeding with severe hemorrhoidal.  The patient had a supratherapeutic INR and was given vitamin K  for reversal.  He was subsequently taken to the OR on 09/05/2023 for hemorrhoidectomy by Dr. Morna Pander.  Postoperatively, the patient remained stable and his hemoglobin remained stable.  He was discharged back to his group home. However, in the morning of 09/08/2023, the patient began difficulty ambulating.  Apparently he was unable to walk to their medicine cabinet to go to get his medications.  The patient states that he has difficulty getting out of bed by himself because of generalized weakness.  He denies any dizziness or syncope.  He states that once he was able to stand up he was so weak he would just fall back onto the bed.  He denies hitting his head.  Because of his generalized weakness and difficulty ambulating, admission was requested. The patient does endorse dyspnea complaint.  He denies pain.  He denies any fevers, chills, cough, shortness breath, nausea, vomiting, abdominal pain. In the ED, the patient was afebrile and hemodynamically stable with oxygen saturation 98% on room air.  WBC 10.7, hemoglobin 9.9, platelet 273.  Sodium 139, potassium 4.2, bicarbonate 32, serum creatinine 1.43.  LFTs unremarkable.  EKG showed atrial fibrillation rate controlled with right bundle branch block.  Troponin 13.  Chest x-ray was negative for infiltrates or edema.  UA showed 21-50 WBC with nitrites.  The patient was empirically started on ceftriaxone . The patient had intermittent periods of somnolence.  Workup was unremarkable.  Overall, the patient mental status gradually improved.

## 2023-09-08 NOTE — Progress Notes (Signed)
 PHARMACY - ANTICOAGULATION CONSULT NOTE  Pharmacy Consult for warfarin  Indication: atrial fibrillation  Allergies  Allergen Reactions   Penicillins Hives    Tolerated Zosyn     Patient Measurements: Height: 6' 4 (193 cm) Weight: 80 kg (176 lb 5.9 oz) IBW/kg (Calculated) : 86.8 HEPARIN  DW (KG): 80  Vital Signs: Temp: 97.5 F (36.4 C) (09/05 0920) Temp Source: Oral (09/05 0920) BP: 164/119 (09/05 1215) Pulse Rate: 81 (09/05 1215)  Labs: Recent Labs    09/06/23 0440 09/07/23 1043 09/08/23 0946  HGB 11.7* 12.0* 11.9*  HCT 36.8* 37.8* 37.6*  PLT 232 263 273  LABPROT  --  18.6* 22.9*  INR  --  1.5* 1.9*  CREATININE  --   --  1.43*  TROPONINIHS  --   --  13    Estimated Creatinine Clearance: 57.5 mL/min (A) (by C-G formula based on SCr of 1.43 mg/dL (H)).   Medical History: Past Medical History:  Diagnosis Date   CHF (congestive heart failure) (HCC)    CKD (chronic kidney disease) stage 3, GFR 30-59 ml/min (HCC)    Edema    Hyperlipemia    Hypertension    Seizures (HCC)    Assessment: 68 year old male with history of afib on chronic warfarin. INR 1.9 on admit.  9 Pharmacy asked to dose warfarin.   INR 1.9, just below goal  Goal of Therapy:  INR 2-3 Monitor platelets by anticoagulation protocol: Yes   Plan:  Warfarin 10mg  po x 1 dose today PT-INR daily Monitor for S/S of bleeding  Cherlyn Boers, BS Pharm D, BCPS Clinical Pharmacist 09/08/2023 2:03 PM

## 2023-09-08 NOTE — H&P (Signed)
 History and Physical    [name] FMW:[id number] DOB: April 01, 1955 DOA: 09/08/2023 DOS: the patient was seen and examined on 09/08/2023 PCP: Wilmon Penton, NP  Patient coming from: ALF/ILF--Group Home  Chief Complaint:  Chief Complaint  Patient presents with   Weakness   HPI: Nathan Shepherd is a 62 history of seizure disorder, chronic atrial fibrillation on warfarin, stroke, polysubstance abuse, hypertension, CAD, hyperlipidemia, CKD stage III presenting with generalized weakness.  The patient was recently discharged from the hospital after a stay from 09/04/2023 to 09/07/2023 secondary to rectal bleeding with severe hemorrhoidal.  The patient had a supratherapeutic INR and was given vitamin K  for reversal.  He was subsequently taken to the OR on 09/05/2023 for hemorrhoidectomy by Dr. Morna Pander.  Postoperatively, the patient remained stable and his hemoglobin remained stable.  He was discharged back to his group home. However, in the morning of 09/08/2023, the patient began difficulty ambulating.  Apparently he was unable to walk to their medicine cabinet to go to get his medications.  The patient states that he has difficulty getting out of bed by himself because of generalized weakness.  He denies any dizziness or syncope.  He states that once he was able to stand up he was so weak he would just fall back onto the bed.  He denies hitting his head.  Because of his generalized weakness and difficulty ambulating, admission was requested. The patient does endorse dyspnea complaint.  He denies pain.  He denies any fevers, chills, cough, shortness breath, nausea, vomiting, abdominal pain. In the ED, the patient was afebrile and hemodynamically stable with oxygen saturation 98% on room air.  WBC 10.7, hemoglobin 9.9, platelet 273.  Sodium 139, potassium 4.2, bicarbonate 32, serum creatinine 1.43.  LFTs unremarkable.  EKG showed atrial fibrillation rate controlled with right bundle  branch block.  Troponin 13.  Chest x-ray was negative for infiltrates or edema.  UA showed 21-50 WBC with nitrites.  Review of Systems: As mentioned in the history of present illness. All other systems reviewed and are negative. Past Medical History:  Diagnosis Date   CHF (congestive heart failure) (HCC)    CKD (chronic kidney disease) stage 3, GFR 30-59 ml/min (HCC)    Edema    Hyperlipemia    Hypertension    Seizures (HCC)    Past Surgical History:  Procedure Laterality Date   COLONOSCOPY N/A 03/24/2023   Procedure: COLONOSCOPY;  Surgeon: Cinderella [name], MD;  Location: [facility];  Service: Endoscopy;  Laterality: N/A;   ESOPHAGOGASTRODUODENOSCOPY N/A 03/24/2023   Procedure: EGD (ESOPHAGOGASTRODUODENOSCOPY);  Surgeon: Cinderella [name], MD;  Location: [facility];  Service: Endoscopy;  Laterality: N/A;   HEMORRHOID SURGERY N/A 09/05/2023   Procedure: EXTENSIVE HEMORRHOIDECTOMY;  Surgeon: Pander Manuelita BROCKS, MD;  Location: [facility];  Service: General;  Laterality: N/A;   NO PAST SURGERIES     Social History:  reports that he has never smoked. He has never used smokeless tobacco. He reports that he does not currently use alcohol. He reports that he does not use drugs.  Allergies  Allergen Reactions   Penicillins Hives    Tolerated Zosyn     Family History  Problem Relation Age of Onset   Colon cancer Neg Hx    Liver disease Neg Hx     Prior to Admission medications   Medication Sig Start Date End Date Taking? Authorizing Provider  acetaminophen  (TYLENOL ) 325 MG tablet Take 650 mg by mouth every 4 (  four) hours as needed for mild pain (pain score 1-3).    [provider]  Calcium  Carbonate Antacid (ANTACID PO) Take 30 mLs by mouth 4 (four) times daily as needed (heartburn, indigestion). Antacid Suspension    [provider]  carvedilol  (COREG ) 3.125 MG tablet Take 1 tablet (3.125 mg total) by mouth 2 (two) times daily. 09/07/23 09/06/24  Pearlean Manus, MD   diphenhydrAMINE  (BENADRYL ) 25 MG tablet Take 25 mg by mouth every 6 (six) hours as needed for allergies or itching.    [provider]  divalproex  (DEPAKOTE ) 250 MG DR tablet Take 3 tablets (750 mg total) by mouth every 12 (twelve) hours. 10/24/22 03/23/23  Jhonny Calvin NOVAK, MD  donepezil  (ARICEPT ) 5 MG tablet Take 5 mg by mouth at bedtime.    [provider]  Emollient (EUCERIN) lotion Apply 1 Application topically daily. (Apply to legs and feet)    [provider]  enoxaparin  (LOVENOX ) 80 MG/0.8ML injection Inject 0.8 mLs (80 mg total) into the skin 2 (two) times daily for 5 days. 09/07/23 09/12/23  Pearlean Manus, MD  feeding supplement (ENSURE ENLIVE / ENSURE PLUS) LIQD Take 237 mLs by mouth 2 (two) times daily between meals. 03/25/23   Maree, Pratik D, DO  fluocinolone 0.01 % cream Apply 1 application  topically 3 (three) times daily as needed (bug bites/rash).    [provider]  levETIRAcetam  (KEPPRA ) 1000 MG tablet Take 1,000 mg by mouth 2 (two) times daily.    [provider]  loperamide  (IMODIUM ) 2 MG capsule Take 1 capsule (2 mg total) by mouth as needed for diarrhea or loose stools. 05/17/22   Danton Reyes DASEN, MD  omeprazole  (PRILOSEC) 20 MG capsule Take 1 capsule (20 mg total) by mouth 2 (two) times daily before a meal. Take 30 min before breakfast and 30 min before dinner 09/07/23   Pearlean Manus, MD  ondansetron  (ZOFRAN ) 4 MG tablet Take 1 tablet (4 mg total) by mouth every 6 (six) hours as needed for nausea. 09/07/23   Pearlean Manus, MD  polyethylene glycol (MIRALAX ) 17 g packet Take 17 g by mouth every Monday, Wednesday, and Friday. 09/08/23   Pearlean Manus, MD  rosuvastatin  (CRESTOR ) 20 MG tablet Take 20 mg by mouth at bedtime.     [provider]  senna-docusate (SENOKOT-S) 8.6-50 MG tablet Take 2 tablets by mouth at bedtime. 09/07/23 09/06/24  Pearlean Manus, MD  SYMBICORT  160-4.5 MCG/ACT inhaler Inhale 2 puffs into the lungs  in the morning and at bedtime. 09/07/23   Pearlean Manus, MD  torsemide  (DEMADEX ) 20 MG tablet Take 20 mg by mouth daily.    [provider]  Vitamin D , Ergocalciferol , (DRISDOL ) 1.25 MG (50000 UNIT) CAPS capsule Take 50,000 Units by mouth every Monday.    [provider]  warfarin (COUMADIN ) 7.5 MG tablet Take 1 tablet (7.5 mg total) by mouth See admin instructions. Warfarin 11.25 mg (1.5 tabs) x 2 days (09/07/23 and 09/08/23)-- then back to coumadin  7.5 mg daily starting 09/09/23 09/07/23   Pearlean Manus, MD    Physical Exam: Vitals:   09/08/23 0930 09/08/23 1145 09/08/23 1200 09/08/23 1215  BP: (!) 165/117 (!) 170/87 (!) 197/131 (!) 164/119  Pulse: 76  82 81  Resp: (!) 23 16 (!) 22 19  Temp:      TempSrc:      SpO2: 93%  98% 95%  Weight:      Height:       GENERAL:  A&O x  3, NAD, well developed, cooperative, follows commands HEENT: [place]/AT, No thrush, No icterus, No oral ulcers Neck:  No neck mass, No meningismus, soft, supple CV: RRR, no S3, no S4, no rub, no JVD Lungs:  CTA, no wheeze, no rhonchi, good air movement Abd: soft/NT +BS, nondistended Ext: No edema, no lymphangitis, no cyanosis, no rashes Neuro:  CN II-XII intact, strength 4/5 in RUE, RLE, strength 4/5 LUE, LLE; sensation intact bilateral; no dysmetria; babinski equivocal  Data Reviewed: Data reviewed above in the history  Assessment and Plan: Generalized weakness -Secondary to UTI - PT evaluation - B12 - Folic acid - TSH - Hemoglobin stable  Rectal bleeding/melena related - Status post hemorrhoidectomy 09/05/2023 - Hemoglobin stable - Remains hemodynamically stable  UTI - UA 21-50WBC - Continue ceftriaxone  pending culture data  Chronic atrial fibrillation - Rate controlled - Continue warfarin  Essential hypertension - Continue carvedilol  - Previously on losartan  and amlodipine   Mixed hyperlipidemia - Continue statin  Seizure disorder - Continue Keppra  and depakote   CKD  stage IIIa - Baseline creatinine 1.2-1.4   Advance Care Planning: FULL  Consults: general surgery  Family Communication: none  Severity of Illness: The appropriate patient status for this patient is OBSERVATION. Observation status is judged to be reasonable and necessary in order to provide the required intensity of service to ensure the patient's safety. The patient's presenting symptoms, physical exam findings, and initial radiographic and laboratory data in the context of their medical condition is felt to place them at decreased risk for further clinical deterioration. Furthermore, it is anticipated that the patient will be medically stable for discharge from the hospital within 2 midnights of admission.   Author: Alm Schneider, MD 09/08/2023 1:40 PM  For on call review [email or web address].

## 2023-09-08 NOTE — TOC Initial Note (Signed)
 Transition of Care [facility]) - Initial/Assessment Note    Patient Details  Name: Nathan Shepherd MRN: [id number] Date of Birth: March 07, 1955  Transition of [facility] CM/SW Contact:    Noreen KATHEE Pinal, LCSWA Phone Number: 09/08/2023, 2:26 PM  Clinical Narrative:                  Patient was recently DC on yesterday back to [facility]. HHPT/HHOT was arranged for patient through [facility]. Usually HH takes up to 48-72 before calling to get start of care scheduled. ICM spoke with LG and updated her that patient is back and under Observation and the chances for him to go to a SNF is less likely to occur because he would need a 3-midnight stay INPATIENT. She expressed understanding medicare guidelines. If patient needs SNF , he would need to go under his Medicaid plan , but only for room and board would be covered. CSW was unable to reach [facility] or leave a confidential HIPAA VM, mail box was full. Mr. [place] home phone goes straight to VM.     Barriers to Discharge: Continued Medical Work up   Patient Goals and CMS Choice Patient states their goals for this hospitalization and ongoing recovery are:: return back to ALF - CMS Medicare.gov Compare Post Acute Care list provided to:: Legal Guardian        Expected Discharge Plan and Services     Post Acute Care Choice: Resumption of Svcs/PTA Provider Living arrangements for the past 2 months: Assisted Living Facility, Independent Living Facility                           HH Arranged: PT, OT [facility] Agency: [facility]        Prior Living Arrangements/Services Living arrangements for the past 2 months: Assisted Living Facility, Independent Living Facility Lives with:: Facility Resident Patient language and need for interpreter reviewed:: Yes Do you feel safe going back to the place where you live?: Yes      Need for Family Participation in Patient Care: Yes (Comment) Care giver support system  in place?: Yes (comment) Current home services: DME Criminal Activity/Legal Involvement Pertinent to Current Situation/Hospitalization: No - Comment as needed  Activities of Daily Living      Permission Sought/Granted      Share Information with NAME: Symphanie     Permission granted to share info w Relationship: LG     Emotional Assessment Appearance:: Appears stated age     Orientation: : Oriented to Self, Oriented to Place, Oriented to  Time, Oriented to Situation Alcohol / Substance Use: Not Applicable Psych Involvement: No (comment)  Admission diagnosis:  Generalized weakness [R53.1] Patient Active Problem List   Diagnosis Date Noted   Generalized weakness 09/08/2023   Lower urinary tract infectious disease 09/08/2023   Hemorrhoid prolapse 09/05/2023   Grade IV hemorrhoids 09/05/2023   Rectal bleeding 09/04/2023   Anemia 03/24/2023   Elevated INR 03/24/2023   Rectal prolapse 03/24/2023   Supratherapeutic INR 03/23/2023   Hypoalbuminemia due to protein-calorie malnutrition (HCC) 03/23/2023   Essential hypertension 03/23/2023   Mixed hyperlipidemia 03/23/2023   History of CVA (cerebrovascular accident) - not on DOAC due to interaction with AEDs 03/23/2023   GI bleed 03/22/2023   Seizure (HCC) - cannot be on DOAC due to concurrent treatment with Depakote . 10/22/2022   Atrial fibrillation, chronic (HCC) - cannot be on DOAC due to concurrent treatment  with Depakote . 08/25/2021   Acute kidney injury superimposed on chronic kidney disease (HCC) - Baseline scr 1.2-1.6 08/25/2021   PCP:  Gammon, Chrystal, NP Pharmacy:   [facility] - [place], [place] - [address] [place] [address] Phone: [phone] Fax: [phone]  [facility] [address] [place] [address] Phone: [phone] Fax: [phone]     Social Drivers of Health (SDOH) Social History: SDOH Screenings   Food Insecurity:  No Food Insecurity (09/04/2023)  Housing: Low Risk  (09/04/2023)  Transportation Needs: No Transportation Needs (09/04/2023)  Utilities: Not At Risk (09/04/2023)  Social Connections: Moderately Isolated (09/04/2023)  Tobacco Use: Low Risk  (09/08/2023)   SDOH Interventions:     Readmission Risk Interventions     No data to display

## 2023-09-08 NOTE — Plan of Care (Signed)

## 2023-09-09 DIAGNOSIS — R531 Weakness: Secondary | ICD-10-CM | POA: Diagnosis not present

## 2023-09-09 DIAGNOSIS — I10 Essential (primary) hypertension: Secondary | ICD-10-CM

## 2023-09-09 DIAGNOSIS — I482 Chronic atrial fibrillation, unspecified: Secondary | ICD-10-CM | POA: Diagnosis not present

## 2023-09-09 DIAGNOSIS — N3000 Acute cystitis without hematuria: Secondary | ICD-10-CM

## 2023-09-09 DIAGNOSIS — R569 Unspecified convulsions: Secondary | ICD-10-CM | POA: Diagnosis not present

## 2023-09-09 LAB — PROTIME-INR
INR: 2.8 — ABNORMAL HIGH (ref 0.8–1.2)
Prothrombin Time: 31.1 s — ABNORMAL HIGH (ref 11.4–15.2)

## 2023-09-09 LAB — BASIC METABOLIC PANEL WITH GFR
Anion gap: 8 (ref 5–15)
BUN: 29 mg/dL — ABNORMAL HIGH (ref 8–23)
CO2: 30 mmol/L (ref 22–32)
Calcium: 9.1 mg/dL (ref 8.9–10.3)
Chloride: 102 mmol/L (ref 98–111)
Creatinine, Ser: 1.26 mg/dL — ABNORMAL HIGH (ref 0.61–1.24)
GFR, Estimated: >60 mL/min (ref >60)
Glucose, Bld: 105 mg/dL — ABNORMAL HIGH (ref 70–99)
Potassium: 4.3 mmol/L (ref 3.5–5.1)
Sodium: 140 mmol/L (ref 135–145)

## 2023-09-09 LAB — CBC
HCT: 34.2 % — ABNORMAL LOW (ref 39.0–52.0)
Hemoglobin: 11.1 g/dL — ABNORMAL LOW (ref 13.0–17.0)
MCH: 28.1 pg (ref 26.0–34.0)
MCHC: 32.5 g/dL (ref 30.0–36.0)
MCV: 86.6 fL (ref 80.0–100.0)
Platelets: 242 K/uL (ref 150–400)
RBC: 3.95 MIL/uL — ABNORMAL LOW (ref 4.22–5.81)
RDW: 15.7 % — ABNORMAL HIGH (ref 11.5–15.5)
WBC: 9.9 K/uL (ref 4.0–10.5)
nRBC: 0 % (ref 0.0–0.2)

## 2023-09-09 LAB — MAGNESIUM: Magnesium: 2 mg/dL (ref 1.7–2.4)

## 2023-09-09 MED ORDER — LACTATED RINGERS IV SOLN: INTRAVENOUS | Status: AC

## 2023-09-09 MED ORDER — WARFARIN SODIUM 5 MG PO TABS
5.0000 mg | ORAL_TABLET | Freq: Once | ORAL | Status: AC
  Administered 2023-09-09: 5 mg via ORAL
  Filled 2023-09-09: qty 1

## 2023-09-09 MED ORDER — INFLUENZA VAC SPLIT HIGH-DOSE 0.5 ML IM SUSY
0.5000 mL | PREFILLED_SYRINGE | INTRAMUSCULAR | Status: AC
  Administered 2023-09-10: 0.5 mL via INTRAMUSCULAR
  Filled 2023-09-09: qty 0.5

## 2023-09-09 NOTE — Plan of Care (Signed)
  Problem: Education: Goal: Knowledge of General Education information will improve Description: Including pain rating scale, medication(s)/side effects and non-pharmacologic comfort measures 09/09/2023 0454 by Olene Corean CROME, RN Outcome: Progressing 09/09/2023 0040 by Olene Corean CROME, RN Outcome: Progressing   Problem: Health Behavior/Discharge Planning: Goal: Ability to manage health-related needs will improve 09/09/2023 0454 by Olene Corean CROME, RN Outcome: Progressing 09/09/2023 0040 by Olene Corean CROME, RN Outcome: Progressing   Problem: Clinical Measurements: Goal: Ability to maintain clinical measurements within normal limits will improve 09/09/2023 0454 by Olene Corean CROME, RN Outcome: Progressing 09/09/2023 0040 by Olene Corean CROME, RN Outcome: Progressing Goal: Will remain free from infection 09/09/2023 0454 by Olene Corean CROME, RN Outcome: Progressing 09/09/2023 0040 by Olene Corean CROME, RN Outcome: Progressing Goal: Diagnostic test results will improve 09/09/2023 0454 by Olene Corean CROME, RN Outcome: Progressing 09/09/2023 0040 by Olene Corean CROME, RN Outcome: Progressing Goal: Respiratory complications will improve 09/09/2023 0454 by Olene Corean CROME, RN Outcome: Progressing 09/09/2023 0040 by Olene Corean CROME, RN Outcome: Progressing Goal: Cardiovascular complication will be avoided 09/09/2023 0454 by Olene Corean CROME, RN Outcome: Progressing 09/09/2023 0040 by Olene Corean CROME, RN Outcome: Progressing   Problem: Activity: Goal: Risk for activity intolerance will decrease 09/09/2023 0454 by Olene Corean CROME, RN Outcome: Progressing 09/09/2023 0040 by Olene Corean CROME, RN Outcome: Progressing   Problem: Nutrition: Goal: Adequate nutrition will be maintained 09/09/2023 0454 by Olene Corean CROME, RN Outcome: Progressing 09/09/2023 0040 by Olene Corean CROME, RN Outcome:  Progressing   Problem: Coping: Goal: Level of anxiety will decrease 09/09/2023 0454 by Olene Corean CROME, RN Outcome: Progressing 09/09/2023 0040 by Olene Corean CROME, RN Outcome: Progressing   Problem: Elimination: Goal: Will not experience complications related to bowel motility 09/09/2023 0454 by Olene Corean CROME, RN Outcome: Progressing 09/09/2023 0040 by Olene Corean CROME, RN Outcome: Progressing Goal: Will not experience complications related to urinary retention 09/09/2023 0454 by Olene Corean CROME, RN Outcome: Progressing 09/09/2023 0040 by Olene Corean CROME, RN Outcome: Progressing   Problem: Pain Managment: Goal: General experience of comfort will improve and/or be controlled 09/09/2023 0454 by Olene Corean CROME, RN Outcome: Progressing 09/09/2023 0040 by Olene Corean CROME, RN Outcome: Progressing   Problem: Safety: Goal: Ability to remain free from injury will improve 09/09/2023 0454 by Olene Corean CROME, RN Outcome: Progressing 09/09/2023 0040 by Olene Corean CROME, RN Outcome: Progressing   Problem: Skin Integrity: Goal: Risk for impaired skin integrity will decrease 09/09/2023 0454 by Olene Corean CROME, RN Outcome: Progressing 09/09/2023 0040 by Olene Corean CROME, RN Outcome: Progressing

## 2023-09-09 NOTE — Plan of Care (Signed)

## 2023-09-09 NOTE — Progress Notes (Signed)
 PHARMACY - ANTICOAGULATION CONSULT NOTE  Pharmacy Consult for warfarin  Indication: atrial fibrillation  Allergies  Allergen Reactions   Penicillins Hives    Tolerated Zosyn     Patient Measurements: Height: 6' 4 (193 cm) Weight: 80 kg (176 lb 5.9 oz) IBW/kg (Calculated) : 86.8 HEPARIN  DW (KG): 80  Vital Signs: Temp: 98.8 F (37.1 C) (09/06 0411) Temp Source: Oral (09/06 0411) BP: 165/95 (09/06 0411) Pulse Rate: 74 (09/06 0411)  Labs: Recent Labs    09/07/23 1043 09/08/23 0929 09/08/23 0946 09/09/23 0517 09/09/23 1137  HGB 12.0*  --  11.9* 11.1*  --   HCT 37.8*  --  37.6* 34.2*  --   PLT 263  --  273 242  --   LABPROT 18.6*  --  22.9*  --  31.1*  INR 1.5*  --  1.9*  --  2.8*  CREATININE  --   --  1.43* 1.26*  --   CKTOTAL  --  76  --   --   --   TROPONINIHS  --   --  13  --   --     Estimated Creatinine Clearance: 65.3 mL/min (A) (by C-G formula based on SCr of 1.26 mg/dL (H)).   Medical History: Past Medical History:  Diagnosis Date   CHF (congestive heart failure) (HCC)    CKD (chronic kidney disease) stage 3, GFR 30-59 ml/min (HCC)    Edema    Hyperlipemia    Hypertension    Seizures (HCC)    Assessment: 68 year old male with history of afib on chronic warfarin. INR 1.9 on admit.  Pharmacy asked to dose warfarin.  PTA home dose 7.5mg  daily  INR 1.9> 2.8, therapeutic but big jump.  Goal of Therapy:  INR 2-3 Monitor platelets by anticoagulation protocol: Yes   Plan:  Warfarin 5mg  po x 1 dose today PT-INR daily Monitor for S/S of bleeding  Cherlyn Boers, BS Pharm D, BCPS Clinical Pharmacist 09/09/2023 12:54 PM

## 2023-09-09 NOTE — Progress Notes (Signed)
 PROGRESS NOTE  Nathan Shepherd FMW:[id number] DOB: 24-Aug-1955 DOA: 09/08/2023 PCP: Wilmon Penton, NP  Brief History:  62 history of seizure disorder, chronic atrial fibrillation on warfarin, stroke, polysubstance abuse, hypertension, CAD, hyperlipidemia, CKD stage III presenting with generalized weakness.  The patient was recently discharged from the hospital after a stay from 09/04/2023 to 09/07/2023 secondary to rectal bleeding with severe hemorrhoidal.  The patient had a supratherapeutic INR and was given vitamin K  for reversal.  He was subsequently taken to the OR on 09/05/2023 for hemorrhoidectomy by Dr. Morna Pander.  Postoperatively, the patient remained stable and his hemoglobin remained stable.  He was discharged back to his group home. However, in the morning of 09/08/2023, the patient began difficulty ambulating.  Apparently he was unable to walk to their medicine cabinet to go to get his medications.  The patient states that he has difficulty getting out of bed by himself because of generalized weakness.  He denies any dizziness or syncope.  He states that once he was able to stand up he was so weak he would just fall back onto the bed.  He denies hitting his head.  Because of his generalized weakness and difficulty ambulating, admission was requested. The patient does endorse dyspnea complaint.  He denies pain.  He denies any fevers, chills, cough, shortness breath, nausea, vomiting, abdominal pain. In the ED, the patient was afebrile and hemodynamically stable with oxygen saturation 98% on room air.  WBC 10.7, hemoglobin 9.9, platelet 273.  Sodium 139, potassium 4.2, bicarbonate 32, serum creatinine 1.43.  LFTs unremarkable.  EKG showed atrial fibrillation rate controlled with right bundle branch block.  Troponin 13.  Chest x-ray was negative for infiltrates or edema.  UA showed 21-50 WBC with nitrites.   Assessment/Plan: Generalized weakness -Secondary to UTI - PT  evaluation>>SNF - B12--760 - Folic acid--9.0 - TSH 2.714 - Hemoglobin stable   Rectal bleeding/melena related - Status post hemorrhoidectomy 09/05/2023 - Hemoglobin stable - Remains hemodynamically stable   UTI - UA 21-50WBC - Continue ceftriaxone  pending culture data   Chronic atrial fibrillation - Rate controlled - Continue warfarin   Essential hypertension - Continue carvedilol  - Previously on losartan  and amlodipine    Mixed hyperlipidemia - Continue statin   Seizure disorder - Continue Keppra  and depakote    CKD stage IIIa - Baseline creatinine 1.2-1.4 - restart IVF as po intake remains poor       Family Communication:   no Family at bedside  Consultants:  none  Code Status:  FULL   DVT Prophylaxis: warfarin   Procedures: As Listed in Progress Note Above  Antibiotics: Ceftriaxone  9/5>>     Subjective: Patient denies fevers, chills, headache, chest pain, dyspnea, nausea, vomiting, diarrhea, abdominal pain, dysuria, hematuria, hematochezia, and melena. Scant blood from rectum today  Objective: Vitals:   09/08/23 2030 09/09/23 0411 09/09/23 0813 09/09/23 1300  BP: (!) 128/98 (!) 165/95  (!) 144/89  Pulse:  74  79  Resp:  18  18  Temp:  98.8 F (37.1 C)  98.2 F (36.8 C)  TempSrc:  Oral  Oral  SpO2:  95% 99% 97%  Weight:      Height:        Intake/Output Summary (Last 24 hours) at 09/09/2023 1822 Last data filed at 09/09/2023 1712 Gross per 24 hour  Intake 760 ml  Output 1700 ml  Net -940 ml   Weight change:  Exam:  General:  Pt  is alert, follows commands appropriately, not in acute distress HEENT: No icterus, No thrush, No neck mass, [place]/AT Cardiovascular: RRR, S1/S2, no rubs, no gallops Respiratory: CTA bilaterally, no wheezing, no crackles, no rhonchi Abdomen: Soft/+BS, non tender, non distended, no guarding Extremities: No edema, No lymphangitis, No petechiae, No rashes, no synovitis   Data Reviewed: I have personally reviewed  following labs and imaging studies Basic Metabolic Panel: Recent Labs  Lab 09/04/23 0910 09/05/23 0437 09/08/23 0946 09/09/23 0517  NA 142 141 139 140  K 3.2* 3.6 4.2 4.3  CL 102 102 98 102  CO2 28 32 32 30  GLUCOSE 84 95 98 105*  BUN 17 20 29* 29*  CREATININE 1.30* 1.34* 1.43* 1.26*  CALCIUM  8.5* 9.0 9.4 9.1  MG  --  1.7  --  2.0   Liver Function Tests: Recent Labs  Lab 09/08/23 0946  AST 13*  ALT 9  ALKPHOS 55  BILITOT 0.8  PROT 6.6  ALBUMIN 2.4*   No results for input(s): LIPASE, AMYLASE in the last 168 hours. No results for input(s): AMMONIA in the last 168 hours. Coagulation Profile: Recent Labs  Lab 09/04/23 0910 09/05/23 0437 09/07/23 1043 09/08/23 0946 09/09/23 1137  INR 8.0* 1.8* 1.5* 1.9* 2.8*   CBC: Recent Labs  Lab 09/04/23 0910 09/05/23 0437 09/06/23 0440 09/07/23 1043 09/08/23 0946 09/09/23 0517  WBC 5.3 6.8 9.1 10.4 10.7* 9.9  NEUTROABS 3.5  --  6.9  --  8.7*  --   HGB 10.6* 10.1* 11.7* 12.0* 11.9* 11.1*  HCT 33.4* 32.0* 36.8* 37.8* 37.6* 34.2*  MCV 88.4 88.2 89.1 89.4 88.5 86.6  PLT 177 191 232 263 273 242   Cardiac Enzymes: Recent Labs  Lab 09/08/23 0929  CKTOTAL 76   BNP: Invalid input(s): POCBNP CBG: No results for input(s): GLUCAP in the last 168 hours. HbA1C: No results for input(s): HGBA1C in the last 72 hours. Urine analysis:    Component Value Date/Time   COLORURINE YELLOW 09/08/2023 1229   APPEARANCEUR CLEAR 09/08/2023 1229   LABSPEC 1.016 09/08/2023 1229   PHURINE 7.0 09/08/2023 1229   GLUCOSEU NEGATIVE 09/08/2023 1229   HGBUR NEGATIVE 09/08/2023 1229   BILIRUBINUR NEGATIVE 09/08/2023 1229   KETONESUR NEGATIVE 09/08/2023 1229   PROTEINUR 30 (A) 09/08/2023 1229   NITRITE POSITIVE (A) 09/08/2023 1229   LEUKOCYTESUR MODERATE (A) 09/08/2023 1229   Sepsis Labs: @LABRCNTIP (procalcitonin:4,lacticidven:4) ) Recent Results (from the past 240 hours)  Resp panel by RT-PCR (RSV, Flu A&B, Covid) Anterior  Nasal Swab     Status: None   Collection Time: 09/08/23  9:29 AM   Specimen: Anterior Nasal Swab  Result Value Ref Range Status   SARS Coronavirus 2 by RT PCR NEGATIVE NEGATIVE Final    Comment: (NOTE) SARS-CoV-2 target nucleic acids are NOT DETECTED.  The SARS-CoV-2 RNA is generally detectable in upper respiratory specimens during the acute phase of infection. The lowest concentration of SARS-CoV-2 viral copies this assay can detect is 138 copies/mL. A negative result does not preclude SARS-Cov-2 infection and should not be used as the sole basis for treatment or other patient management decisions. A negative result may occur with  improper specimen collection/handling, submission of specimen other than nasopharyngeal swab, presence of viral mutation(s) within the areas targeted by this assay, and inadequate number of viral copies(<138 copies/mL). A negative result must be combined with clinical observations, patient history, and epidemiological information. The expected result is Negative.  Fact Sheet for Patients:  BloggerCourse.com  Fact Sheet  for Healthcare Providers:  SeriousBroker.it  This test is no t yet approved or cleared by the [place]  FDA and  has been authorized for detection and/or diagnosis of SARS-CoV-2 by FDA under an Emergency Use Authorization (EUA). This EUA will remain  in effect (meaning this test can be used) for the duration of the COVID-19 declaration under Section 564(b)(1) of the Act, 21 U.S.C.section 360bbb-3(b)(1), unless the authorization is terminated  or revoked sooner.       Influenza A by PCR NEGATIVE NEGATIVE Final   Influenza B by PCR NEGATIVE NEGATIVE Final    Comment: (NOTE) The Xpert Xpress SARS-CoV-2/FLU/RSV plus assay is intended as an aid in the diagnosis of influenza from Nasopharyngeal swab specimens and should not be used as a sole basis for treatment. Nasal washings  and aspirates are unacceptable for Xpert Xpress SARS-CoV-2/FLU/RSV testing.  Fact Sheet for Patients: BloggerCourse.com  Fact Sheet for Healthcare Providers: SeriousBroker.it  This test is not yet approved or cleared by the [place]  FDA and has been authorized for detection and/or diagnosis of SARS-CoV-2 by FDA under an Emergency Use Authorization (EUA). This EUA will remain in effect (meaning this test can be used) for the duration of the COVID-19 declaration under Section 564(b)(1) of the Act, 21 U.S.C. section 360bbb-3(b)(1), unless the authorization is terminated or revoked.     Resp Syncytial Virus by PCR NEGATIVE NEGATIVE Final    Comment: (NOTE) Fact Sheet for Patients: BloggerCourse.com  Fact Sheet for Healthcare Providers: SeriousBroker.it  This test is not yet approved or cleared by the [place]  FDA and has been authorized for detection and/or diagnosis of SARS-CoV-2 by FDA under an Emergency Use Authorization (EUA). This EUA will remain in effect (meaning this test can be used) for the duration of the COVID-19 declaration under Section 564(b)(1) of the Act, 21 U.S.C. section 360bbb-3(b)(1), unless the authorization is terminated or revoked.  Performed at [facility], [address], [place],  [address]      Scheduled Meds:  carvedilol   3.125 mg Oral BID WC   divalproex   750 mg Oral Q12H   donepezil   5 mg Oral QHS   feeding supplement  237 mL Oral BID BM   fluticasone  furoate-vilanterol  1 puff Inhalation Daily   [START ON 09/10/2023] Influenza vac split trivalent PF  0.5 mL Intramuscular Tomorrow-1000   levETIRAcetam   1,000 mg Oral BID   pantoprazole   40 mg Oral Daily   polyethylene glycol  17 g Oral Q M,W,F   rosuvastatin   20 mg Oral QHS   senna-docusate  2 tablet Oral QHS   [START ON 09/11/2023] Vitamin D  (Ergocalciferol )  50,000 Units Oral Q  Mon   Warfarin - Pharmacist Dosing Inpatient   Does not apply q1600   Continuous Infusions:  cefTRIAXone  (ROCEPHIN )  IV 1 g (09/09/23 1150)    Procedures/Studies: DG Chest Port 1 View Result Date: 09/08/2023 CLINICAL DATA:  weakness EXAM: PORTABLE CHEST - 1 VIEW COMPARISON:  February 22, 2023 FINDINGS: No focal airspace consolidation, pleural effusion, or pneumothorax. Moderate cardiomegaly. Aortic atherosclerosis. No acute fracture or destructive lesions. Multilevel thoracic osteophytosis. IMPRESSION: Redemonstrated cardiomegaly. Otherwise, no acute cardiopulmonary abnormality. Electronically Signed   By: Rogelia Myers M.D.   On: 09/08/2023 11:20    Alm Schneider, DO  [facility]  If 7PM-7AM, please contact night-coverage [email or web address] Password [email or web address] 09/09/2023, 6:22 PM   LOS: 0 days

## 2023-09-09 NOTE — Progress Notes (Signed)
   09/09/23 1118  TOC Brief Assessment  Insurance and Status Reviewed  Patient has primary care physician Yes  Home environment has been reviewed From ALF  Prior level of function: Needs Assistance/Has a Legal GuardianLegal Guardian: Boyce,Symphanie  Prior/Current Home Services Current home services  Social Drivers of Health Review SDOH reviewed no interventions necessary  Readmission risk has been reviewed Yes  Transition of care needs transition of care needs identified, TOC will continue to follow   ICM following for DC needs.

## 2023-09-10 ENCOUNTER — Observation Stay (HOSPITAL_COMMUNITY)

## 2023-09-10 ENCOUNTER — Encounter (HOSPITAL_COMMUNITY): Payer: Self-pay | Admitting: Radiology

## 2023-09-10 DIAGNOSIS — I482 Chronic atrial fibrillation, unspecified: Secondary | ICD-10-CM | POA: Diagnosis not present

## 2023-09-10 DIAGNOSIS — N3000 Acute cystitis without hematuria: Secondary | ICD-10-CM | POA: Diagnosis not present

## 2023-09-10 DIAGNOSIS — R531 Weakness: Secondary | ICD-10-CM | POA: Diagnosis not present

## 2023-09-10 LAB — CBC
HCT: 34.6 % — ABNORMAL LOW (ref 39.0–52.0)
Hemoglobin: 11.1 g/dL — ABNORMAL LOW (ref 13.0–17.0)
MCH: 28 pg (ref 26.0–34.0)
MCHC: 32.1 g/dL (ref 30.0–36.0)
MCV: 87.2 fL (ref 80.0–100.0)
Platelets: 259 K/uL (ref 150–400)
RBC: 3.97 MIL/uL — ABNORMAL LOW (ref 4.22–5.81)
RDW: 15.7 % — ABNORMAL HIGH (ref 11.5–15.5)
WBC: 14.4 K/uL — ABNORMAL HIGH (ref 4.0–10.5)
nRBC: 0 % (ref 0.0–0.2)

## 2023-09-10 LAB — PROTIME-INR
INR: 2.5 — ABNORMAL HIGH (ref 0.8–1.2)
Prothrombin Time: 28.2 s — ABNORMAL HIGH (ref 11.4–15.2)

## 2023-09-10 LAB — BASIC METABOLIC PANEL WITH GFR
Anion gap: 10 (ref 5–15)
BUN: 25 mg/dL — ABNORMAL HIGH (ref 8–23)
CO2: 28 mmol/L (ref 22–32)
Calcium: 9 mg/dL (ref 8.9–10.3)
Chloride: 100 mmol/L (ref 98–111)
Creatinine, Ser: 1.2 mg/dL (ref 0.61–1.24)
GFR, Estimated: >60 mL/min (ref >60)
Glucose, Bld: 109 mg/dL — ABNORMAL HIGH (ref 70–99)
Potassium: 4.1 mmol/L (ref 3.5–5.1)
Sodium: 138 mmol/L (ref 135–145)

## 2023-09-10 LAB — MAGNESIUM: Magnesium: 1.8 mg/dL (ref 1.7–2.4)

## 2023-09-10 MED ORDER — WARFARIN SODIUM 7.5 MG PO TABS
7.5000 mg | ORAL_TABLET | Freq: Once | ORAL | Status: AC
  Administered 2023-09-10: 7.5 mg via ORAL
  Filled 2023-09-10: qty 1

## 2023-09-10 NOTE — TOC CM/SW Note (Signed)
 FL2 completed. SNF referrals sent. Pt's status is currently observation.

## 2023-09-10 NOTE — Progress Notes (Signed)
 PHARMACY - ANTICOAGULATION CONSULT NOTE  Pharmacy Consult for warfarin  Indication: atrial fibrillation  Allergies  Allergen Reactions   Penicillins Hives    Tolerated Zosyn     Patient Measurements: Height: 6' 4 (193 cm) Weight: 80 kg (176 lb 5.9 oz) IBW/kg (Calculated) : 86.8 HEPARIN  DW (KG): 80  Vital Signs: Temp: 100.1 F (37.8 C) (09/07 0401) Temp Source: Oral (09/07 0401) BP: 134/94 (09/07 0401) Pulse Rate: 93 (09/07 0401)  Labs: Recent Labs    09/08/23 0929 09/08/23 0946 09/09/23 0517 09/09/23 1137 09/10/23 0431  HGB  --  11.9* 11.1*  --  11.1*  HCT  --  37.6* 34.2*  --  34.6*  PLT  --  273 242  --  259  LABPROT  --  22.9*  --  31.1* 28.2*  INR  --  1.9*  --  2.8* 2.5*  CREATININE  --  1.43* 1.26*  --  1.20  CKTOTAL 76  --   --   --   --   TROPONINIHS  --  13  --   --   --     Estimated Creatinine Clearance: 68.5 mL/min (by C-G formula based on SCr of 1.2 mg/dL).   Medical History: Past Medical History:  Diagnosis Date   CHF (congestive heart failure) (HCC)    CKD (chronic kidney disease) stage 3, GFR 30-59 ml/min (HCC)    Edema    Hyperlipemia    Hypertension    Seizures (HCC)    Assessment: 68 year old male with history of afib on chronic warfarin. INR 1.9 on admit.  Pharmacy asked to dose warfarin.  PTA home dose 7.5mg  daily  INR 1.9> 2.8> 2.5, therapeutic   Goal of Therapy:  INR 2-3 Monitor platelets by anticoagulation protocol: Yes   Plan:  Warfarin 7.5mg  po x 1 dose today PT-INR daily Monitor for S/S of bleeding  Cherlyn Boers, BS Pharm D, BCPS Clinical Pharmacist 09/10/2023 7:43 AM

## 2023-09-10 NOTE — Plan of Care (Signed)

## 2023-09-10 NOTE — Progress Notes (Addendum)
 PROGRESS NOTE  Nathan Shepherd FMW:[id number] DOB: 06-21-1955 DOA: 09/08/2023 PCP: Wilmon Penton, NP  Brief History:  62 history of seizure disorder, chronic atrial fibrillation on warfarin, stroke, polysubstance abuse, hypertension, CAD, hyperlipidemia, CKD stage III presenting with generalized weakness.  The patient was recently discharged from the hospital after a stay from 09/04/2023 to 09/07/2023 secondary to rectal bleeding with severe hemorrhoidal.  The patient had a supratherapeutic INR and was given vitamin K  for reversal.  He was subsequently taken to the OR on 09/05/2023 for hemorrhoidectomy by Dr. Morna Pander.  Postoperatively, the patient remained stable and his hemoglobin remained stable.  He was discharged back to his group home. However, in the morning of 09/08/2023, the patient began difficulty ambulating.  Apparently he was unable to walk to their medicine cabinet to go to get his medications.  The patient states that he has difficulty getting out of bed by himself because of generalized weakness.  He denies any dizziness or syncope.  He states that once he was able to stand up he was so weak he would just fall back onto the bed.  He denies hitting his head.  Because of his generalized weakness and difficulty ambulating, admission was requested. The patient does endorse dyspnea complaint.  He denies pain.  He denies any fevers, chills, cough, shortness breath, nausea, vomiting, abdominal pain. In the ED, the patient was afebrile and hemodynamically stable with oxygen saturation 98% on room air.  WBC 10.7, hemoglobin 9.9, platelet 273.  Sodium 139, potassium 4.2, bicarbonate 32, serum creatinine 1.43.  LFTs unremarkable.  EKG showed atrial fibrillation rate controlled with right bundle branch block.  Troponin 13.  Chest x-ray was negative for infiltrates or edema.  UA showed 21-50 WBC with nitrites.   Assessment/Plan:  Generalized weakness -Secondary to UTI - PT  evaluation>>SNF - B12--760 - Folic acid--9.0 - TSH 2.714 - Hemoglobin stable - CK 76   Rectal bleeding/melena related - Status post hemorrhoidectomy 09/05/2023 - Hemoglobin stable - Remains hemodynamically stable   UTI--E coli - UA 21-50WBC - Continue ceftriaxone  pending culture data  left leg weakness -pt unable to clarify how long he's had left leg weaknss -MR brain -MR lumbar spine   Chronic atrial fibrillation - Rate controlled - Continue warfarin   Essential hypertension - Continue carvedilol  - Previously on losartan  and amlodipine    Mixed hyperlipidemia - Continue statin   Seizure disorder - Continue Keppra  and depakote    CKD stage IIIa - Baseline creatinine 1.2-1.4 - restart IVF as po intake remains poor           Family Communication:   no Family at bedside   Consultants:  none   Code Status:  FULL    DVT Prophylaxis: warfarin     Procedures: As Listed in Progress Note Above   Antibiotics: Ceftriaxone  9/5>>          Subjective: Patient denies fevers, chills, headache, chest pain, dyspnea, nausea, vomiting, diarrhea, abdominal pain, dysuria, hematuria, hematochezia, and melena. Denies back pain or radicular pain   Objective: Vitals:   09/10/23 0401 09/10/23 0826 09/10/23 0841 09/10/23 1321  BP: (!) 134/94  (!) 150/102 (!) 125/98  Pulse: 93  82 80  Resp: (!) 24     Temp: 100.1 F (37.8 C)   98.6 F (37 C)  TempSrc: Oral   Axillary  SpO2: 96% 97%  98%  Weight:      Height:  Intake/Output Summary (Last 24 hours) at 09/10/2023 1702 Last data filed at 09/10/2023 1500 Gross per 24 hour  Intake 1080 ml  Output 1600 ml  Net -520 ml   Weight change:  Exam:  General:  Pt is alert, follows commands appropriately, not in acute distress HEENT: No icterus, No thrush, No neck mass, [place]/AT Cardiovascular: IRRR, S1/S2, no rubs, no gallops Respiratory: bibasilar rales.  No wheeze Abdomen: Soft/+BS, non tender, non distended, no  guarding Extremities: No edema, No lymphangitis, No petechiae, No rashes, no synovitis Neuro:  CN II-XII intact, strength 4/5 in RUE, 3-RLE, strength 4/5 LUE, LLE; sensation intact bilateral; no dysmetria; babinski equivocal    Data Reviewed: I have personally reviewed following labs and imaging studies Basic Metabolic Panel: Recent Labs  Lab 09/04/23 0910 09/05/23 0437 09/08/23 0946 09/09/23 0517 09/10/23 0431  NA 142 141 139 140 138  K 3.2* 3.6 4.2 4.3 4.1  CL 102 102 98 102 100  CO2 28 32 32 30 28  GLUCOSE 84 95 98 105* 109*  BUN 17 20 29* 29* 25*  CREATININE 1.30* 1.34* 1.43* 1.26* 1.20  CALCIUM  8.5* 9.0 9.4 9.1 9.0  MG  --  1.7  --  2.0 1.8   Liver Function Tests: Recent Labs  Lab 09/08/23 0946  AST 13*  ALT 9  ALKPHOS 55  BILITOT 0.8  PROT 6.6  ALBUMIN 2.4*   No results for input(s): LIPASE, AMYLASE in the last 168 hours. No results for input(s): AMMONIA in the last 168 hours. Coagulation Profile: Recent Labs  Lab 09/05/23 0437 09/07/23 1043 09/08/23 0946 09/09/23 1137 09/10/23 0431  INR 1.8* 1.5* 1.9* 2.8* 2.5*   CBC: Recent Labs  Lab 09/04/23 0910 09/05/23 0437 09/06/23 0440 09/07/23 1043 09/08/23 0946 09/09/23 0517 09/10/23 0431  WBC 5.3   < > 9.1 10.4 10.7* 9.9 14.4*  NEUTROABS 3.5  --  6.9  --  8.7*  --   --   HGB 10.6*   < > 11.7* 12.0* 11.9* 11.1* 11.1*  HCT 33.4*   < > 36.8* 37.8* 37.6* 34.2* 34.6*  MCV 88.4   < > 89.1 89.4 88.5 86.6 87.2  PLT 177   < > 232 263 273 242 259   < > = values in this interval not displayed.   Cardiac Enzymes: Recent Labs  Lab 09/08/23 0929  CKTOTAL 76   BNP: Invalid input(s): POCBNP CBG: No results for input(s): GLUCAP in the last 168 hours. HbA1C: No results for input(s): HGBA1C in the last 72 hours. Urine analysis:    Component Value Date/Time   COLORURINE YELLOW 09/08/2023 1229   APPEARANCEUR CLEAR 09/08/2023 1229   LABSPEC 1.016 09/08/2023 1229   PHURINE 7.0 09/08/2023 1229    GLUCOSEU NEGATIVE 09/08/2023 1229   HGBUR NEGATIVE 09/08/2023 1229   BILIRUBINUR NEGATIVE 09/08/2023 1229   KETONESUR NEGATIVE 09/08/2023 1229   PROTEINUR 30 (A) 09/08/2023 1229   NITRITE POSITIVE (A) 09/08/2023 1229   LEUKOCYTESUR MODERATE (A) 09/08/2023 1229   Sepsis Labs: @LABRCNTIP (procalcitonin:4,lacticidven:4) ) Recent Results (from the past 240 hours)  Resp panel by RT-PCR (RSV, Flu A&B, Covid) Anterior Nasal Swab     Status: None   Collection Time: 09/08/23  9:29 AM   Specimen: Anterior Nasal Swab  Result Value Ref Range Status   SARS Coronavirus 2 by RT PCR NEGATIVE NEGATIVE Final    Comment: (NOTE) SARS-CoV-2 target nucleic acids are NOT DETECTED.  The SARS-CoV-2 RNA is generally detectable in upper respiratory specimens during the  acute phase of infection. The lowest concentration of SARS-CoV-2 viral copies this assay can detect is 138 copies/mL. A negative result does not preclude SARS-Cov-2 infection and should not be used as the sole basis for treatment or other patient management decisions. A negative result may occur with  improper specimen collection/handling, submission of specimen other than nasopharyngeal swab, presence of viral mutation(s) within the areas targeted by this assay, and inadequate number of viral copies(<138 copies/mL). A negative result must be combined with clinical observations, patient history, and epidemiological information. The expected result is Negative.  Fact Sheet for Patients:  BloggerCourse.com  Fact Sheet for Healthcare Providers:  SeriousBroker.it  This test is no t yet approved or cleared by the [place]  FDA and  has been authorized for detection and/or diagnosis of SARS-CoV-2 by FDA under an Emergency Use Authorization (EUA). This EUA will remain  in effect (meaning this test can be used) for the duration of the COVID-19 declaration under Section 564(b)(1) of the  Act, 21 U.S.C.section 360bbb-3(b)(1), unless the authorization is terminated  or revoked sooner.       Influenza A by PCR NEGATIVE NEGATIVE Final   Influenza B by PCR NEGATIVE NEGATIVE Final    Comment: (NOTE) The Xpert Xpress SARS-CoV-2/FLU/RSV plus assay is intended as an aid in the diagnosis of influenza from Nasopharyngeal swab specimens and should not be used as a sole basis for treatment. Nasal washings and aspirates are unacceptable for Xpert Xpress SARS-CoV-2/FLU/RSV testing.  Fact Sheet for Patients: BloggerCourse.com  Fact Sheet for Healthcare Providers: SeriousBroker.it  This test is not yet approved or cleared by the [place]  FDA and has been authorized for detection and/or diagnosis of SARS-CoV-2 by FDA under an Emergency Use Authorization (EUA). This EUA will remain in effect (meaning this test can be used) for the duration of the COVID-19 declaration under Section 564(b)(1) of the Act, 21 U.S.C. section 360bbb-3(b)(1), unless the authorization is terminated or revoked.     Resp Syncytial Virus by PCR NEGATIVE NEGATIVE Final    Comment: (NOTE) Fact Sheet for Patients: BloggerCourse.com  Fact Sheet for Healthcare Providers: SeriousBroker.it  This test is not yet approved or cleared by the [place]  FDA and has been authorized for detection and/or diagnosis of SARS-CoV-2 by FDA under an Emergency Use Authorization (EUA). This EUA will remain in effect (meaning this test can be used) for the duration of the COVID-19 declaration under Section 564(b)(1) of the Act, 21 U.S.C. section 360bbb-3(b)(1), unless the authorization is terminated or revoked.  Performed at [facility], [address] [place], [place] [address]   Urine Culture (for pregnant, neutropenic or urologic patients or patients with an indwelling urinary catheter)     Status: Abnormal  (Preliminary result)   Collection Time: 09/08/23 12:33 PM   Specimen: Urine, Clean Catch  Result Value Ref Range Status   Specimen Description   Final    URINE, CLEAN CATCH Performed at [facility], [address] [place], [place] [address]    Special Requests   Final    NONE Performed at [facility], [address], [place], [place] [address]    Culture (A)  Final    >=100,000 COLONIES/mL ESCHERICHIA COLI SUSCEPTIBILITIES TO FOLLOW Performed at [facility], [address], [place], [place] [address]    Report Status PENDING  Incomplete     Scheduled Meds:  carvedilol   3.125 mg Oral BID WC   divalproex   750 mg Oral Q12H   donepezil   5  mg Oral QHS   feeding supplement  237 mL Oral BID BM   fluticasone  furoate-vilanterol  1 puff Inhalation Daily   levETIRAcetam   1,000 mg Oral BID   pantoprazole   40 mg Oral Daily   polyethylene glycol  17 g Oral Q M,W,F   rosuvastatin   20 mg Oral QHS   senna-docusate  2 tablet Oral QHS   [START ON 09/11/2023] Vitamin D  (Ergocalciferol )  50,000 Units Oral Q Mon   warfarin  7.5 mg Oral ONCE-1600   Warfarin - Pharmacist Dosing Inpatient   Does not apply q1600   Continuous Infusions:  cefTRIAXone  (ROCEPHIN )  IV 1 g (09/10/23 1044)    Procedures/Studies: DG Chest Port 1 View Result Date: 09/08/2023 CLINICAL DATA:  weakness EXAM: PORTABLE CHEST - 1 VIEW COMPARISON:  February 22, 2023 FINDINGS: No focal airspace consolidation, pleural effusion, or pneumothorax. Moderate cardiomegaly. Aortic atherosclerosis. No acute fracture or destructive lesions. Multilevel thoracic osteophytosis. IMPRESSION: Redemonstrated cardiomegaly. Otherwise, no acute cardiopulmonary abnormality. Electronically Signed   By: Rogelia Myers M.D.   On: 09/08/2023 11:20    Alm Schneider, DO  [facility]  If 7PM-7AM, please contact night-coverage [email or web address] Password [email or web address] 09/10/2023, 5:02 PM   LOS: 0 days

## 2023-09-10 NOTE — [place] FL2 (Signed)
 [place]  MEDICAID FL2 LEVEL OF CARE FORM     IDENTIFICATION  Patient Name: Nathan Shepherd Birthdate: 03-12-1955 Sex: male Admission Date (Current Location): 09/08/2023  [place] and [place] Number:  [place] [id number] Facility and Address:  [facility],  [address], [place] [address]      Provider Number: [id number]  Attending Physician Name and Address:  Evonnie Lenis, MD  Relative Name and Phone Number:  Ula Bare  (legal guardian) [phone]    Current Level of Care: Hospital Recommended Level of Care: Skilled Nursing Facility Prior Approval Number:    Date Approved/Denied:   PASRR Number: [id number]  Discharge Plan: SNF    Current Diagnoses: Patient Active Problem List   Diagnosis Date Noted   Acute cystitis without hematuria 09/09/2023   Generalized weakness 09/08/2023   Lower urinary tract infectious disease 09/08/2023   Hemorrhoid prolapse 09/05/2023   Grade IV hemorrhoids 09/05/2023   Rectal bleeding 09/04/2023   Anemia 03/24/2023   Elevated INR 03/24/2023   Rectal prolapse 03/24/2023   Supratherapeutic INR 03/23/2023   Hypoalbuminemia due to protein-calorie malnutrition (HCC) 03/23/2023   Essential hypertension 03/23/2023   Mixed hyperlipidemia 03/23/2023   History of CVA (cerebrovascular accident) - not on DOAC due to interaction with AEDs 03/23/2023   GI bleed 03/22/2023   Seizure (HCC) - cannot be on DOAC due to concurrent treatment with Depakote . 10/22/2022   Atrial fibrillation, chronic (HCC) - cannot be on DOAC due to concurrent treatment with Depakote . 08/25/2021   Acute kidney injury superimposed on chronic kidney disease (HCC) - Baseline scr 1.2-1.6 08/25/2021    Orientation RESPIRATION BLADDER Height & Weight     Self, Time, Situation, Place  Normal Incontinent Weight: 80 kg Height:  6' 4 (193 cm)  BEHAVIORAL SYMPTOMS/MOOD NEUROLOGICAL BOWEL NUTRITION STATUS      Incontinent Diet (see dc summary)   AMBULATORY STATUS COMMUNICATION OF NEEDS Skin   Extensive Assist   Surgical wounds (hemorrhoidectomy)                       Personal Care Assistance Level of Assistance  Bathing, Feeding, Dressing Bathing Assistance: Maximum assistance Feeding assistance: Independent Dressing Assistance: Maximum assistance     Functional Limitations Info  Sight, Hearing, Speech Sight Info: Adequate Hearing Info: Adequate Speech Info: Adequate    SPECIAL CARE FACTORS FREQUENCY  PT (By licensed PT), OT (By licensed OT)     PT Frequency: 5x weekly OT Frequency: 5x weekly            Contractures Contractures Info: Not present    Additional Factors Info  Code Status, Allergies Code Status Info: Full Allergies Info: Penicillins           Current Medications (09/10/2023):  This is the current hospital active medication list Current Facility-Administered Medications  Medication Dose Route Frequency Provider Last Rate Last Admin   acetaminophen  (TYLENOL ) tablet 650 mg  650 mg Oral Q6H PRN Tat, Lenis, MD       Or   acetaminophen  (TYLENOL ) suppository 650 mg  650 mg Rectal Q6H PRN Tat, Lenis, MD       carvedilol  (COREG ) tablet 3.125 mg  3.125 mg Oral BID WC Tat, David, MD   3.125 mg at 09/10/23 0841   cefTRIAXone  (ROCEPHIN ) 1 g in sodium chloride  0.9 % 100 mL IVPB  1 g Intravenous Q24H Tat, Lenis, MD 200 mL/hr at 09/10/23 1044 1 g at 09/10/23 1044   diphenhydrAMINE  (BENADRYL ) tablet 25 mg  25 mg Oral Q6H PRN Tat, David, MD       divalproex  (DEPAKOTE ) DR tablet 750 mg  750 mg Oral Q12H Tat, David, MD   750 mg at 09/10/23 9162   donepezil  (ARICEPT ) tablet 5 mg  5 mg Oral QHS Evonnie Lenis, MD   5 mg at 09/09/23 2337   feeding supplement (ENSURE ENLIVE / ENSURE PLUS) liquid 237 mL  237 mL Oral BID BM Tat, Lenis, MD   237 mL at 09/10/23 1406   fluticasone  furoate-vilanterol (BREO ELLIPTA ) 200-25 MCG/ACT 1 puff  1 puff Inhalation Daily Tat, David, MD   1 puff at 09/10/23 0826   levETIRAcetam   (KEPPRA ) tablet 1,000 mg  1,000 mg Oral BID Tat, Lenis, MD   1,000 mg at 09/10/23 9161   ondansetron  (ZOFRAN ) tablet 4 mg  4 mg Oral Q6H PRN Tat, Lenis, MD       Or   ondansetron  (ZOFRAN ) injection 4 mg  4 mg Intravenous Q6H PRN Tat, Lenis, MD       pantoprazole  (PROTONIX ) EC tablet 40 mg  40 mg Oral Daily Tat, David, MD   40 mg at 09/10/23 9162   polyethylene glycol (MIRALAX  / GLYCOLAX ) packet 17 g  17 g Oral Q M,W,F Tat, Lenis, MD   17 g at 09/08/23 1518   rosuvastatin  (CRESTOR ) tablet 20 mg  20 mg Oral QHS Tat, David, MD   20 mg at 09/09/23 2337   senna-docusate (Senokot-S) tablet 2 tablet  2 tablet Oral QHS Tat, David, MD   2 tablet at 09/09/23 2337   [START ON 09/11/2023] Vitamin D  (Ergocalciferol ) (DRISDOL ) 1.25 MG (50000 UNIT) capsule 50,000 Units  50,000 Units Oral Q Pablo Evonnie Lenis, MD       warfarin (COUMADIN ) tablet 7.5 mg  7.5 mg Oral LEOBARDO Evonnie Lenis, MD       Warfarin - Pharmacist Dosing Inpatient   Does not apply [id number] Evonnie Lenis, MD   Given at 09/09/23 1721     Discharge Medications: Please see discharge summary for a list of discharge medications.  Relevant Imaging Results:  Relevant Lab Results:   Additional Information SSN: [id number]  Nena LITTIE Coffee, RN

## 2023-09-11 DIAGNOSIS — R400 Somnolence: Secondary | ICD-10-CM

## 2023-09-11 DIAGNOSIS — Z23 Encounter for immunization: Secondary | ICD-10-CM | POA: Diagnosis present

## 2023-09-11 DIAGNOSIS — N390 Urinary tract infection, site not specified: Secondary | ICD-10-CM | POA: Diagnosis present

## 2023-09-11 DIAGNOSIS — A4151 Sepsis due to Escherichia coli [E. coli]: Secondary | ICD-10-CM | POA: Diagnosis present

## 2023-09-11 DIAGNOSIS — N3000 Acute cystitis without hematuria: Secondary | ICD-10-CM | POA: Diagnosis not present

## 2023-09-11 DIAGNOSIS — E782 Mixed hyperlipidemia: Secondary | ICD-10-CM | POA: Diagnosis present

## 2023-09-11 DIAGNOSIS — I2510 Atherosclerotic heart disease of native coronary artery without angina pectoris: Secondary | ICD-10-CM | POA: Diagnosis present

## 2023-09-11 DIAGNOSIS — I482 Chronic atrial fibrillation, unspecified: Secondary | ICD-10-CM | POA: Diagnosis present

## 2023-09-11 DIAGNOSIS — I130 Hypertensive heart and chronic kidney disease with heart failure and stage 1 through stage 4 chronic kidney disease, or unspecified chronic kidney disease: Secondary | ICD-10-CM | POA: Diagnosis present

## 2023-09-11 DIAGNOSIS — Z9889 Other specified postprocedural states: Secondary | ICD-10-CM | POA: Diagnosis not present

## 2023-09-11 DIAGNOSIS — Z7951 Long term (current) use of inhaled steroids: Secondary | ICD-10-CM | POA: Diagnosis not present

## 2023-09-11 DIAGNOSIS — R509 Fever, unspecified: Secondary | ICD-10-CM

## 2023-09-11 DIAGNOSIS — R531 Weakness: Secondary | ICD-10-CM | POA: Diagnosis not present

## 2023-09-11 DIAGNOSIS — M47896 Other spondylosis, lumbar region: Secondary | ICD-10-CM | POA: Diagnosis present

## 2023-09-11 DIAGNOSIS — Z7901 Long term (current) use of anticoagulants: Secondary | ICD-10-CM | POA: Diagnosis not present

## 2023-09-11 DIAGNOSIS — Z8673 Personal history of transient ischemic attack (TIA), and cerebral infarction without residual deficits: Secondary | ICD-10-CM | POA: Diagnosis not present

## 2023-09-11 DIAGNOSIS — E875 Hyperkalemia: Secondary | ICD-10-CM | POA: Diagnosis present

## 2023-09-11 DIAGNOSIS — K921 Melena: Secondary | ICD-10-CM | POA: Diagnosis present

## 2023-09-11 DIAGNOSIS — Z79899 Other long term (current) drug therapy: Secondary | ICD-10-CM | POA: Diagnosis not present

## 2023-09-11 DIAGNOSIS — N1831 Chronic kidney disease, stage 3a: Secondary | ICD-10-CM | POA: Diagnosis present

## 2023-09-11 DIAGNOSIS — R791 Abnormal coagulation profile: Secondary | ICD-10-CM | POA: Diagnosis present

## 2023-09-11 DIAGNOSIS — R4189 Other symptoms and signs involving cognitive functions and awareness: Secondary | ICD-10-CM | POA: Diagnosis present

## 2023-09-11 DIAGNOSIS — Z880 Allergy status to penicillin: Secondary | ICD-10-CM | POA: Diagnosis not present

## 2023-09-11 DIAGNOSIS — I4510 Unspecified right bundle-branch block: Secondary | ICD-10-CM | POA: Diagnosis present

## 2023-09-11 DIAGNOSIS — B9620 Unspecified Escherichia coli [E. coli] as the cause of diseases classified elsewhere: Secondary | ICD-10-CM | POA: Diagnosis present

## 2023-09-11 DIAGNOSIS — D500 Iron deficiency anemia secondary to blood loss (chronic): Secondary | ICD-10-CM | POA: Diagnosis present

## 2023-09-11 DIAGNOSIS — G40909 Epilepsy, unspecified, not intractable, without status epilepticus: Secondary | ICD-10-CM | POA: Diagnosis present

## 2023-09-11 LAB — BASIC METABOLIC PANEL WITH GFR
Anion gap: 4 — ABNORMAL LOW (ref 5–15)
BUN: 28 mg/dL — ABNORMAL HIGH (ref 8–23)
CO2: 31 mmol/L (ref 22–32)
Calcium: 8.9 mg/dL (ref 8.9–10.3)
Chloride: 104 mmol/L (ref 98–111)
Creatinine, Ser: 1.37 mg/dL — ABNORMAL HIGH (ref 0.61–1.24)
GFR, Estimated: 57 mL/min — ABNORMAL LOW (ref >60)
Glucose, Bld: 109 mg/dL — ABNORMAL HIGH (ref 70–99)
Potassium: 4.3 mmol/L (ref 3.5–5.1)
Sodium: 139 mmol/L (ref 135–145)

## 2023-09-11 LAB — URINE CULTURE: Culture: >=100000 — AB

## 2023-09-11 LAB — PROTIME-INR
INR: 3 — ABNORMAL HIGH (ref 0.8–1.2)
Prothrombin Time: 32.4 s — ABNORMAL HIGH (ref 11.4–15.2)

## 2023-09-11 LAB — CBC
HCT: 32.2 % — ABNORMAL LOW (ref 39.0–52.0)
Hemoglobin: 10.2 g/dL — ABNORMAL LOW (ref 13.0–17.0)
MCH: 28.4 pg (ref 26.0–34.0)
MCHC: 31.7 g/dL (ref 30.0–36.0)
MCV: 89.7 fL (ref 80.0–100.0)
Platelets: 219 K/uL (ref 150–400)
RBC: 3.59 MIL/uL — ABNORMAL LOW (ref 4.22–5.81)
RDW: 15.9 % — ABNORMAL HIGH (ref 11.5–15.5)
WBC: 11.2 K/uL — ABNORMAL HIGH (ref 4.0–10.5)
nRBC: 0 % (ref 0.0–0.2)

## 2023-09-11 LAB — MAGNESIUM: Magnesium: 1.8 mg/dL (ref 1.7–2.4)

## 2023-09-11 LAB — VALPROIC ACID LEVEL: Valproic Acid Lvl: 64 ug/mL (ref 50–100)

## 2023-09-11 LAB — URIC ACID: Uric Acid, Serum: 5.2 mg/dL (ref 3.7–8.6)

## 2023-09-11 LAB — AMMONIA: Ammonia: 25 umol/L (ref 9–35)

## 2023-09-11 MED ORDER — MAGNESIUM SULFATE 2 GM/50ML IV SOLN
2.0000 g | Freq: Once | INTRAVENOUS | Status: AC
  Administered 2023-09-11: 2 g via INTRAVENOUS
  Filled 2023-09-11: qty 50

## 2023-09-11 MED ORDER — LACTATED RINGERS IV SOLN: INTRAVENOUS | Status: AC

## 2023-09-11 MED ORDER — CEPHALEXIN 500 MG PO CAPS
500.0000 mg | ORAL_CAPSULE | Freq: Three times a day (TID) | ORAL | Status: DC
  Administered 2023-09-12 – 2023-09-15 (×10): 500 mg via ORAL
  Filled 2023-09-11 (×10): qty 1

## 2023-09-11 MED ORDER — WARFARIN SODIUM 2.5 MG PO TABS
2.5000 mg | ORAL_TABLET | Freq: Once | ORAL | Status: AC
  Administered 2023-09-11: 2.5 mg via ORAL
  Filled 2023-09-11: qty 1

## 2023-09-11 NOTE — Plan of Care (Signed)

## 2023-09-11 NOTE — TOC Progression Note (Addendum)
 Transition of Care [facility]) - Progression Note    Patient Details  Name: Nathan Shepherd MRN: [id number] Date of Birth: 1955-05-24  Transition of [facility]) CM/SW Contact  Hoy DELENA Bigness, [name] Phone Number: 09/11/2023, 11:08 AM  Clinical Narrative:    VM left w/ pt's Legal Guardian to discuss discharge plans and review bed offers for SNF.  [place] currently running pt's MCD to determine benefit eligibility for [facility] SNF.  CSW will continue to follow.   ADDENDUM: Spoke with pt's LG, [name], who is agreeable to SNF recommendation. She accepts offer for placement at [facility]. Pt will need 3 MN stay in order for insurance to cover rehab and pt could discharge to SNF on 9/11.     Barriers to Discharge: Continued Medical Work up               Expected Discharge Plan and Services     Post Acute Care Choice: Resumption of Svcs/PTA Provider Living arrangements for the past 2 months: Assisted Living Facility, Independent Living Facility                           HH Arranged: PT, OT [facility] Agency: [facility]         Social Drivers of Health (SDOH) Interventions SDOH Screenings   Food Insecurity: No Food Insecurity (09/08/2023)  Housing: Low Risk  (09/08/2023)  Transportation Needs: No Transportation Needs (09/08/2023)  Utilities: Not At Risk (09/08/2023)  Social Connections: Moderately Isolated (09/08/2023)  Tobacco Use: Low Risk  (09/08/2023)    Readmission Risk Interventions     No data to display

## 2023-09-11 NOTE — Progress Notes (Signed)
 PHARMACY - ANTICOAGULATION CONSULT NOTE  Pharmacy Consult for warfarin  Indication: atrial fibrillation  Allergies  Allergen Reactions   Penicillins Hives    Tolerated Zosyn     Patient Measurements: Height: 6' 4 (193 cm) Weight: 80 kg (176 lb 5.9 oz) IBW/kg (Calculated) : 86.8 HEPARIN  DW (KG): 80  Vital Signs: Temp: 98.5 F (36.9 C) (09/08 0430) Temp Source: Oral (09/08 0430) BP: 111/92 (09/08 0430) Pulse Rate: 74 (09/08 0430)  Labs: Recent Labs    09/08/23 0929 09/08/23 0946 09/08/23 0946 09/09/23 0517 09/09/23 1137 09/10/23 0431 09/11/23 0442  HGB  --  11.9*   < > 11.1*  --  11.1* 10.2*  HCT  --  37.6*   < > 34.2*  --  34.6* 32.2*  PLT  --  273   < > 242  --  259 219  LABPROT  --  22.9*   < >  --  31.1* 28.2* 32.4*  INR  --  1.9*   < >  --  2.8* 2.5* 3.0*  CREATININE  --  1.43*   < > 1.26*  --  1.20 1.37*  CKTOTAL 76  --   --   --   --   --   --   TROPONINIHS  --  13  --   --   --   --   --    < > = values in this interval not displayed.    Estimated Creatinine Clearance: 60 mL/min (A) (by C-G formula based on SCr of 1.37 mg/dL (H)).   Medical History: Past Medical History:  Diagnosis Date   CHF (congestive heart failure) (HCC)    CKD (chronic kidney disease) stage 3, GFR 30-59 ml/min (HCC)    Edema    Hyperlipemia    Hypertension    Seizures (HCC)    Assessment: 68 year old male with history of afib on chronic warfarin. INR 1.9 on admit.  Pharmacy asked to dose warfarin.  PTA home dose 7.5mg  daily  INR 1.9> 2.8> 2.5> 3 , therapeutic but upper end. Will give low dose today  Goal of Therapy:  INR 2-3 Monitor platelets by anticoagulation protocol: Yes   Plan:  Warfarin 2.5mg  po x 1 dose today PT-INR daily Monitor for S/S of bleeding  Cherlyn Boers, BS Pharm D, BCPS Clinical Pharmacist 09/11/2023 8:34 AM

## 2023-09-11 NOTE — Progress Notes (Signed)
 Pt noted to pull his IV out last night for no apparent reason. Pt denied pulling his IV out even though he had the IV catheter in his hand along with a small amount of blood from the IV that was located inside the palm of his hand and on his fingertips. This writer waited until this am to place a new IV as pt did not have anything scheduled overnight to be given via IV route and to prevent patient from removing a new IV if placed as the next shift will need IV access. This writer placed a 22G IV to pts R hand with x1 attempt. Good blood return. Tegaderm applied covering IV site for patency along with tape to secure placement. This Clinical research associate also wrapped IV site to hand with kerlex to prevent IV from being removed by patient in all hopes and to protect and keep IV safe from being snagged on his bed linens. This Clinical research associate educated pt as to why he has the IV in place and not to remove it or pull it out as he requires IV access for medication administration. Pt stated that he understood and would not remove IV. Assigned nurse at this time made aware of this writer placing new IV. Documentation has been completed in [employer].

## 2023-09-11 NOTE — Progress Notes (Addendum)
 PROGRESS NOTE  Nathan Shepherd FMW:[id number] DOB: 1955/06/08 DOA: 09/08/2023 PCP: Wilmon Penton, NP  Brief History:  62 history of seizure disorder, chronic atrial fibrillation on warfarin, stroke, polysubstance abuse, hypertension, CAD, hyperlipidemia, CKD stage III presenting with generalized weakness.  The patient was recently discharged from the hospital after a stay from 09/04/2023 to 09/07/2023 secondary to rectal bleeding with severe hemorrhoidal.  The patient had a supratherapeutic INR and was given vitamin K  for reversal.  He was subsequently taken to the OR on 09/05/2023 for hemorrhoidectomy by Dr. Morna Pander.  Postoperatively, the patient remained stable and his hemoglobin remained stable.  He was discharged back to his group home. However, in the morning of 09/08/2023, the patient began difficulty ambulating.  Apparently he was unable to walk to their medicine cabinet to go to get his medications.  The patient states that he has difficulty getting out of bed by himself because of generalized weakness.  He denies any dizziness or syncope.  He states that once he was able to stand up he was so weak he would just fall back onto the bed.  He denies hitting his head.  Because of his generalized weakness and difficulty ambulating, admission was requested. The patient does endorse dyspnea complaint.  He denies pain.  He denies any fevers, chills, cough, shortness breath, nausea, vomiting, abdominal pain. In the ED, the patient was afebrile and hemodynamically stable with oxygen saturation 98% on room air.  WBC 10.7, hemoglobin 9.9, platelet 273.  Sodium 139, potassium 4.2, bicarbonate 32, serum creatinine 1.43.  LFTs unremarkable.  EKG showed atrial fibrillation rate controlled with right bundle branch block.  Troponin 13.  Chest x-ray was negative for infiltrates or edema.  UA showed 21-50 WBC with nitrites.   Assessment/Plan:  Generalized weakness -Secondary to UTI - PT  evaluation>>SNF - B12--760 - Folic acid--9.0 - TSH 2.714 - Hemoglobin stable - CK 76 - MR brain negative -am cortisol  Fever -Tmax 101.3 on 9/7, 99.7 on 9/8 -blood cultures x 2 -urine acid 5.2 -9/8 vitals 99.7--HR94-RR18--106/68--96%RA -MR Lumbar spine--neg for acute findings, only DJD  Somnolence -conversant and follows commands when awake -MR brain neg -check ammonia (on depakote ) -depakote  level   Rectal bleeding/melena related - Status post hemorrhoidectomy 09/05/2023 - Hemoglobin stable - Remains hemodynamically stable   UTI--E coli - UA 21-50WBC - Continue ceftriaxone  pending culture data   left leg weakness -pt unable to clarify how long he's had left leg weaknss -MR brain neg -MR lumbar spine--neg   Chronic atrial fibrillation - Rate controlled - Continue warfarin   Essential hypertension - Continue carvedilol  - Previously on losartan  and amlodipine    Mixed hyperlipidemia - Continue statin - CK 76   Seizure disorder - Continue Keppra  and depakote    CKD stage IIIa - Baseline creatinine 1.2-1.4 - restart IVF as po intake remains poor           Family Communication:   no Family at bedside   Consultants:  none   Code Status:  FULL    DVT Prophylaxis: warfarin     Procedures: As Listed in Progress Note Above   Antibiotics: Ceftriaxone  9/5>>9/8           Subjective: Patient denies fevers, chills, headache, chest pain, dyspnea, nausea, vomiting, diarrhea, abdominal pain, dysuria, hematuria, hematochezia, and melena.   Objective: Vitals:   09/11/23 0430 09/11/23 0759 09/11/23 1226 09/11/23 1539  BP: (!) 111/92  111/68 (!) 128/92  Pulse: 74  84 (!) 108  Resp: 18     Temp: 98.5 F (36.9 C)  99 F (37.2 C)   TempSrc: Oral  Oral   SpO2: 96% 96% 97%   Weight:      Height:        Intake/Output Summary (Last 24 hours) at 09/11/2023 1638 Last data filed at 09/11/2023 1226 Gross per 24 hour  Intake 460 ml  Output 900 ml  Net  -440 ml   Weight change:  Exam:  General:  Pt is alert, follows commands appropriately, not in acute distress HEENT: No icterus, No thrush, No neck mass, [place]/AT Cardiovascular: RRR, S1/S2, no rubs, no gallops Respiratory: bibasilar rales.  No wheeze Abdomen: Soft/+BS, non tender, non distended, no guarding Extremities: No edema, No lymphangitis, No petechiae, No rashes, no synovitis Neuro:  CN II-XII intact, strength 4/5 in RUE, RLE, strength 4/5 LUE, 3-/5 LLE; sensation intact bilateral; no dysmetria; babinski equivocal    Data Reviewed: I have personally reviewed following labs and imaging studies Basic Metabolic Panel: Recent Labs  Lab 09/05/23 0437 09/08/23 0946 09/09/23 0517 09/10/23 0431 09/11/23 0442  NA 141 139 140 138 139  K 3.6 4.2 4.3 4.1 4.3  CL 102 98 102 100 104  CO2 32 32 30 28 31   GLUCOSE 95 98 105* 109* 109*  BUN 20 29* 29* 25* 28*  CREATININE 1.34* 1.43* 1.26* 1.20 1.37*  CALCIUM  9.0 9.4 9.1 9.0 8.9  MG 1.7  --  2.0 1.8 1.8   Liver Function Tests: Recent Labs  Lab 09/08/23 0946  AST 13*  ALT 9  ALKPHOS 55  BILITOT 0.8  PROT 6.6  ALBUMIN 2.4*   No results for input(s): LIPASE, AMYLASE in the last 168 hours. No results for input(s): AMMONIA in the last 168 hours. Coagulation Profile: Recent Labs  Lab 09/07/23 1043 09/08/23 0946 09/09/23 1137 09/10/23 0431 09/11/23 0442  INR 1.5* 1.9* 2.8* 2.5* 3.0*   CBC: Recent Labs  Lab 09/06/23 0440 09/07/23 1043 09/08/23 0946 09/09/23 0517 09/10/23 0431 09/11/23 0442  WBC 9.1 10.4 10.7* 9.9 14.4* 11.2*  NEUTROABS 6.9  --  8.7*  --   --   --   HGB 11.7* 12.0* 11.9* 11.1* 11.1* 10.2*  HCT 36.8* 37.8* 37.6* 34.2* 34.6* 32.2*  MCV 89.1 89.4 88.5 86.6 87.2 89.7  PLT 232 263 273 242 259 219   Cardiac Enzymes: Recent Labs  Lab 09/08/23 0929  CKTOTAL 76   BNP: Invalid input(s): POCBNP CBG: No results for input(s): GLUCAP in the last 168 hours. HbA1C: No results for input(s):  HGBA1C in the last 72 hours. Urine analysis:    Component Value Date/Time   COLORURINE YELLOW 09/08/2023 1229   APPEARANCEUR CLEAR 09/08/2023 1229   LABSPEC 1.016 09/08/2023 1229   PHURINE 7.0 09/08/2023 1229   GLUCOSEU NEGATIVE 09/08/2023 1229   HGBUR NEGATIVE 09/08/2023 1229   BILIRUBINUR NEGATIVE 09/08/2023 1229   KETONESUR NEGATIVE 09/08/2023 1229   PROTEINUR 30 (A) 09/08/2023 1229   NITRITE POSITIVE (A) 09/08/2023 1229   LEUKOCYTESUR MODERATE (A) 09/08/2023 1229   Sepsis Labs: @LABRCNTIP (procalcitonin:4,lacticidven:4) ) Recent Results (from the past 240 hours)  Resp panel by RT-PCR (RSV, Flu A&B, Covid) Anterior Nasal Swab     Status: None   Collection Time: 09/08/23  9:29 AM   Specimen: Anterior Nasal Swab  Result Value Ref Range Status   SARS Coronavirus 2 by RT PCR NEGATIVE NEGATIVE Final    Comment: (NOTE) SARS-CoV-2 target nucleic acids  are NOT DETECTED.  The SARS-CoV-2 RNA is generally detectable in upper respiratory specimens during the acute phase of infection. The lowest concentration of SARS-CoV-2 viral copies this assay can detect is 138 copies/mL. A negative result does not preclude SARS-Cov-2 infection and should not be used as the sole basis for treatment or other patient management decisions. A negative result may occur with  improper specimen collection/handling, submission of specimen other than nasopharyngeal swab, presence of viral mutation(s) within the areas targeted by this assay, and inadequate number of viral copies(<138 copies/mL). A negative result must be combined with clinical observations, patient history, and epidemiological information. The expected result is Negative.  Fact Sheet for Patients:  BloggerCourse.com  Fact Sheet for Healthcare Providers:  SeriousBroker.it  This test is no t yet approved or cleared by the [place]  FDA and  has been authorized for detection and/or  diagnosis of SARS-CoV-2 by FDA under an Emergency Use Authorization (EUA). This EUA will remain  in effect (meaning this test can be used) for the duration of the COVID-19 declaration under Section 564(b)(1) of the Act, 21 U.S.C.section 360bbb-3(b)(1), unless the authorization is terminated  or revoked sooner.       Influenza A by PCR NEGATIVE NEGATIVE Final   Influenza B by PCR NEGATIVE NEGATIVE Final    Comment: (NOTE) The Xpert Xpress SARS-CoV-2/FLU/RSV plus assay is intended as an aid in the diagnosis of influenza from Nasopharyngeal swab specimens and should not be used as a sole basis for treatment. Nasal washings and aspirates are unacceptable for Xpert Xpress SARS-CoV-2/FLU/RSV testing.  Fact Sheet for Patients: BloggerCourse.com  Fact Sheet for Healthcare Providers: SeriousBroker.it  This test is not yet approved or cleared by the [place]  FDA and has been authorized for detection and/or diagnosis of SARS-CoV-2 by FDA under an Emergency Use Authorization (EUA). This EUA will remain in effect (meaning this test can be used) for the duration of the COVID-19 declaration under Section 564(b)(1) of the Act, 21 U.S.C. section 360bbb-3(b)(1), unless the authorization is terminated or revoked.     Resp Syncytial Virus by PCR NEGATIVE NEGATIVE Final    Comment: (NOTE) Fact Sheet for Patients: BloggerCourse.com  Fact Sheet for Healthcare Providers: SeriousBroker.it  This test is not yet approved or cleared by the [place]  FDA and has been authorized for detection and/or diagnosis of SARS-CoV-2 by FDA under an Emergency Use Authorization (EUA). This EUA will remain in effect (meaning this test can be used) for the duration of the COVID-19 declaration under Section 564(b)(1) of the Act, 21 U.S.C. section 360bbb-3(b)(1), unless the authorization is terminated  or revoked.  Performed at [facility], [address], [place], [place] [address]   Urine Culture (for pregnant, neutropenic or urologic patients or patients with an indwelling urinary catheter)     Status: Abnormal   Collection Time: 09/08/23 12:33 PM   Specimen: Urine, Clean Catch  Result Value Ref Range Status   Specimen Description   Final    URINE, CLEAN CATCH Performed at [facility], [address], [place], [place] [address]    Special Requests   Final    NONE Performed at [facility], [address], [place], [place] [address]    Culture >=100,000 COLONIES/mL ESCHERICHIA COLI (A)  Final   Report Status 09/11/2023 FINAL  Final   Organism ID, Bacteria ESCHERICHIA COLI (A)  Final      Susceptibility   Escherichia coli - MIC*    AMPICILLIN >=32 RESISTANT Resistant  CEFAZOLIN (URINE) Value in next row Sensitive      2 SENSITIVEThis is a modified FDA-approved test that has been validated and its performance characteristics determined by the reporting laboratory.  This laboratory is certified under the Clinical Laboratory Improvement Amendments CLIA as qualified to perform high complexity clinical laboratory testing.    CEFEPIME  Value in next row Sensitive      2 SENSITIVEThis is a modified FDA-approved test that has been validated and its performance characteristics determined by the reporting laboratory.  This laboratory is certified under the Clinical Laboratory Improvement Amendments CLIA as qualified to perform high complexity clinical laboratory testing.    ERTAPENEM Value in next row Sensitive      2 SENSITIVEThis is a modified FDA-approved test that has been validated and its performance characteristics determined by the reporting laboratory.  This laboratory is certified under the Clinical Laboratory Improvement Amendments CLIA as qualified to perform high complexity clinical laboratory testing.    CEFTRIAXONE  Value in next row Sensitive      2 SENSITIVEThis is a modified  FDA-approved test that has been validated and its performance characteristics determined by the reporting laboratory.  This laboratory is certified under the Clinical Laboratory Improvement Amendments CLIA as qualified to perform high complexity clinical laboratory testing.    CIPROFLOXACIN Value in next row Sensitive      2 SENSITIVEThis is a modified FDA-approved test that has been validated and its performance characteristics determined by the reporting laboratory.  This laboratory is certified under the Clinical Laboratory Improvement Amendments CLIA as qualified to perform high complexity clinical laboratory testing.    GENTAMICIN Value in next row Sensitive      2 SENSITIVEThis is a modified FDA-approved test that has been validated and its performance characteristics determined by the reporting laboratory.  This laboratory is certified under the Clinical Laboratory Improvement Amendments CLIA as qualified to perform high complexity clinical laboratory testing.    NITROFURANTOIN Value in next row Sensitive      2 SENSITIVEThis is a modified FDA-approved test that has been validated and its performance characteristics determined by the reporting laboratory.  This laboratory is certified under the Clinical Laboratory Improvement Amendments CLIA as qualified to perform high complexity clinical laboratory testing.    TRIMETH/SULFA Value in next row Sensitive      2 SENSITIVEThis is a modified FDA-approved test that has been validated and its performance characteristics determined by the reporting laboratory.  This laboratory is certified under the Clinical Laboratory Improvement Amendments CLIA as qualified to perform high complexity clinical laboratory testing.    AMPICILLIN/SULBACTAM Value in next row Resistant      2 SENSITIVEThis is a modified FDA-approved test that has been validated and its performance characteristics determined by the reporting laboratory.  This laboratory is certified under the  Clinical Laboratory Improvement Amendments CLIA as qualified to perform high complexity clinical laboratory testing.    PIP/TAZO Value in next row Sensitive ug/mL     <=4 SENSITIVEThis is a modified FDA-approved test that has been validated and its performance characteristics determined by the reporting laboratory.  This laboratory is certified under the Clinical Laboratory Improvement Amendments CLIA as qualified to perform high complexity clinical laboratory testing.    MEROPENEM Value in next row Sensitive      <=4 SENSITIVEThis is a modified FDA-approved test that has been validated and its performance characteristics determined by the reporting laboratory.  This laboratory is certified under the Clinical Laboratory Improvement Amendments CLIA as qualified to perform  high complexity clinical laboratory testing.    * >=100,000 COLONIES/mL ESCHERICHIA COLI     Scheduled Meds:  carvedilol   3.125 mg Oral BID WC   divalproex   750 mg Oral Q12H   donepezil   5 mg Oral QHS   feeding supplement  237 mL Oral BID BM   fluticasone  furoate-vilanterol  1 puff Inhalation Daily   levETIRAcetam   1,000 mg Oral BID   pantoprazole   40 mg Oral Daily   polyethylene glycol  17 g Oral Q M,W,F   rosuvastatin   20 mg Oral QHS   senna-docusate  2 tablet Oral QHS   Vitamin D  (Ergocalciferol )  50,000 Units Oral Q Mon   Warfarin - Pharmacist Dosing Inpatient   Does not apply q1600   Continuous Infusions:  cefTRIAXone  (ROCEPHIN )  IV 1 g (09/11/23 1001)    Procedures/Studies: MR BRAIN WO CONTRAST Result Date: 09/10/2023 CLINICAL DATA:  Neuro deficit, acute, stroke suspected right leg weakness EXAM: MRI HEAD WITHOUT CONTRAST TECHNIQUE: Multiplanar, multiecho pulse sequences of the brain and surrounding structures were obtained without intravenous contrast. COMPARISON:  CT head February 19, 25. FINDINGS: Brain: No acute infarction, hemorrhage, hydrocephalus, extra-axial collection or mass lesion. Remote left PCA  territory infarct. Moderate patchy T2/FLAIR hyperintensities in the white matter compatible with chronic microvascular ischemic disease. Vascular: Normal flow voids. Skull and upper cervical spine: Normal marrow signal. Sinuses/Orbits: Paranasal sinus mucosal thickening with opacified frontal sinuses, right maxillary sinus, and anterior ethmoid air cells. Other: No mastoid effusions. IMPRESSION: 1. No evidence of acute intracranial abnormality. 2. Remote left PCA territory infarct and chronic microvascular ischemic disease. 3. Severe paranasal sinus disease. Electronically Signed   By: Gilmore GORMAN Molt M.D.   On: 09/10/2023 18:38   MR LUMBAR SPINE WO CONTRAST Result Date: 09/10/2023 CLINICAL DATA:  Left leg weakness. EXAM: MRI LUMBAR SPINE WITHOUT CONTRAST TECHNIQUE: Multiplanar, multisequence MR imaging of the lumbar spine was performed. No intravenous contrast was administered. COMPARISON:  None Available. FINDINGS: Segmentation:  Standard. Alignment:  Normal. Vertebrae: No fracture, suspicious marrow lesion, or significant marrow edema. Conus medullaris and cauda equina: Conus extends to the T12-L1 level. Conus and cauda equina appear normal. Paraspinal and other soft tissues: Unremarkable. Disc levels: T12-L1: Negative. L1-2: Disc desiccation and mild disc space narrowing. Mild disc bulging and mild facet hypertrophy without significant stenosis. L2-3: Minimal disc bulging without significant stenosis. L3-4: Disc desiccation and mild disc space narrowing. Disc bulging and moderate facet hypertrophy result in mild bilateral lateral recess stenosis and mild right neural foraminal stenosis without spinal stenosis. L4-5: Disc desiccation and mild disc space narrowing. Disc bulging and moderate facet hypertrophy result in moderate right greater than left lateral recess stenosis and mild-to-moderate bilateral neural foraminal stenosis with some potential to affect the L5 nerve roots bilaterally. No spinal stenosis.  L5-S1: Normal disc.  Mild facet hypertrophy without stenosis. IMPRESSION: Multilevel lumbar disc and facet degeneration, most notable at L4-5 where there is moderate lateral recess and mild-to-moderate neural foraminal stenosis. Electronically Signed   By: Dasie Hamburg M.D.   On: 09/10/2023 18:35   DG Chest Port 1 View Result Date: 09/08/2023 CLINICAL DATA:  weakness EXAM: PORTABLE CHEST - 1 VIEW COMPARISON:  February 22, 2023 FINDINGS: No focal airspace consolidation, pleural effusion, or pneumothorax. Moderate cardiomegaly. Aortic atherosclerosis. No acute fracture or destructive lesions. Multilevel thoracic osteophytosis. IMPRESSION: Redemonstrated cardiomegaly. Otherwise, no acute cardiopulmonary abnormality. Electronically Signed   By: Rogelia Myers M.D.   On: 09/08/2023 11:20    Alm Schneider, DO  [facility]  If 7PM-7AM, please contact night-coverage [email or web address] Password [facility] 09/11/2023, 4:38 PM   LOS: 0 days

## 2023-09-11 NOTE — Evaluation (Signed)
 Occupational Therapy Evaluation Patient Details Name: Nathan Shepherd MRN: [id number] DOB: [birth year] Today's Date: 09/11/2023   History of Present Illness   Linda Biehn is a 62 history of seizure disorder, chronic atrial fibrillation on warfarin, stroke, polysubstance abuse, hypertension, CAD, hyperlipidemia, CKD stage III presenting with generalized weakness.  The patient was recently discharged from the hospital after a stay from 09/04/2023 to 09/07/2023 secondary to rectal bleeding with severe hemorrhoidal.  The patient had a supratherapeutic INR and was given vitamin K  for reversal.  He was subsequently taken to the OR on 09/05/2023 for hemorrhoidectomy by Dr. Morna Pander.  Postoperatively, the patient remained stable and his hemoglobin remained stable.  He was discharged back to his group home.  However, in the morning of 09/08/2023, the patient began difficulty ambulating.  Apparently he was unable to walk to their medicine cabinet to go to get his medications.  The patient states that he has difficulty getting out of bed by himself because of generalized weakness.  He denies any dizziness or syncope.  He states that once he was able to stand up he was so weak he would just fall back onto the bed.  He denies hitting his head.  Because of his generalized weakness and difficulty ambulating, admission was requested. (per MD)     Clinical Impressions Pt agreeable to OT evaluation. Pt oriented but struggled to follow simple commands consistently. Pt demonstrates max A for bed mobility. Pt unable to stand despite 4 attempts. Pt achieved partial squat high enough to scoot to head of bed, but this was with max to total assist from this therapist. Difficult to assess B UE fully due to direction following issues. Pt required mod to max A for upper body tasks and max to total for lower body tasks based on observation and clinical judgement today. Pt left in the bed with call bell within reach and  bed alarm set. Pt will benefit from continued OT in the hospital and recommended venue below to increase strength, balance, and endurance for safe ADL's.        If plan is discharge home, recommend the following:   Two people to help with walking and/or transfers;A lot of help with bathing/dressing/bathroom;Assistance with cooking/housework;Help with stairs or ramp for entrance;Assist for transportation;Direct supervision/assist for medications management     Functional Status Assessment   Patient has had a recent decline in their functional status and demonstrates the ability to make significant improvements in function in a reasonable and predictable amount of time.     Equipment Recommendations   None recommended by OT             Precautions/Restrictions   Precautions Precautions: Fall Recall of Precautions/Restrictions: Impaired Restrictions Weight Bearing Restrictions Per Provider Order: No     Mobility Bed Mobility Overal bed mobility: Needs Assistance Bed Mobility: Supine to Sit, Sit to Supine     Supine to sit: Max assist, HOB elevated Sit to supine: Max assist   General bed mobility comments: Much assist for supine to sit; posterior lean with very tight lower back. Assist to bring B LE into bed as well.    Transfers Overall transfer level: Needs assistance Equipment used: Rolling walker (2 wheels) (per chart)               General transfer comment: Attemted sit to stand twice with RW without success. Very poor ability for pt to lean forward for sit to stand. Two attempts of sit to stand  without AD leading to partially lifting off the bed enought to scoot closer to the head of bed. Max/total assist for those to partial squats.      Balance Overall balance assessment: Needs assistance Sitting-balance support: Feet supported, No upper extremity supported Sitting balance-Leahy Scale: Poor Sitting balance - Comments: seated at EOB; posterior  lean Postural control: Posterior lean                                 ADL either performed or assessed with clinical judgement   ADL Overall ADL's : Needs assistance/impaired     Grooming: Minimal assistance;Moderate assistance;Sitting   Upper Body Bathing: Minimal assistance;Moderate assistance;Sitting   Lower Body Bathing: Maximal assistance;Total assistance;Sitting/lateral leans   Upper Body Dressing : Minimal assistance;Moderate assistance;Sitting   Lower Body Dressing: Maximal assistance;Total assistance;Sitting/lateral leans   Toilet Transfer: Total assistance;Maximal assistance Toilet Transfer Details (indicate cue type and reason): Unable to fully stand today. Only able to paritally lift bottom off the bed twice with max/total assist to scoot to head of bed. Toileting- Clothing Manipulation and Hygiene: Maximal assistance;Total assistance;Bed level               Vision Baseline Vision/History: 1 Wears glasses Ability to See in Adequate Light: 0 Adequate Patient Visual Report: No change from baseline Vision Assessment?: Wears glasses for reading;No apparent visual deficits     Perception Perception: Not tested       Praxis Praxis: Not tested       Pertinent Vitals/Pain Pain Assessment Pain Assessment: Faces Faces Pain Scale: Hurts little more Pain Location: L LE with assisted movement Pain Descriptors / Indicators: Grimacing, Discomfort Pain Intervention(s): Limited activity within patient's tolerance, Monitored during session, Repositioned     Extremity/Trunk Assessment Upper Extremity Assessment Upper Extremity Assessment: Difficult to assess due to impaired cognition;Generalized weakness (3-/5 B UE A/ROM while supine. 4/5 grip bilaterally. B UE tremors throughout the session.)   Lower Extremity Assessment Lower Extremity Assessment: Defer to PT evaluation   Cervical / Trunk Assessment Cervical / Trunk Assessment: Kyphotic    Communication Communication Communication: No apparent difficulties   Cognition Arousal: Lethargic Behavior During Therapy: Anxious Cognition: Cognition impaired             OT - Cognition Comments: Pt oriented but struggled to follow commands at times. Unsure what is patient preference versus direction following issues.                 Following commands: Impaired Following commands impaired: Follows one step commands inconsistently     Cueing  General Comments   Cueing Techniques: Verbal cues;Tactile cues                 Home Living Family/patient expects to be discharged to:: Assisted living                             Home Equipment: None   Additional Comments: Pt from ALF but stated many times he does not want to go back there.      Prior Functioning/Environment Prior Level of Function : Needs assist       Physical Assist : ADLs (physical);Mobility (physical) Mobility (physical): Bed mobility;Transfers;Gait;Stairs ADLs (physical): IADLs Mobility Comments: Short distance ambulator without AD; stays in the ALF facility. ADLs Comments: Independent ADL; assist IADL    OT Problem List: Decreased strength;Decreased range of motion;Decreased activity  tolerance;Impaired balance (sitting and/or standing);Decreased coordination;Decreased cognition;Pain   OT Treatment/Interventions: Self-care/ADL training;Therapeutic exercise;DME and/or AE instruction;Therapeutic activities;Cognitive remediation/compensation;Patient/family education;Balance training      OT Goals(Current goals can be found in the care plan section)   Acute Rehab OT Goals Patient Stated Goal: Go to a new facility OT Goal Formulation: With patient Time For Goal Achievement: 09/25/23 Potential to Achieve Goals: Good   OT Frequency:  Min 3X/week                                   End of Session Equipment Utilized During Treatment: Gait belt;Rolling walker (2  wheels)  Activity Tolerance: Patient tolerated treatment well Patient left: in bed;with bed alarm set;with call bell/phone within reach  OT Visit Diagnosis: Unsteadiness on feet (R26.81);Other abnormalities of gait and mobility (R26.89);Muscle weakness (generalized) (M62.81);History of falling (Z91.81);Other symptoms and signs involving cognitive function                Time: 1004-1031 OT Time Calculation (min): 27 min Charges:  OT General Charges $OT Visit: 1 Visit OT Evaluation $OT Eval Moderate Complexity: 1 Mod  Cattleya Dobratz OT, MOT   Jayson Person 09/11/2023, 1:45 PM

## 2023-09-11 NOTE — Plan of Care (Signed)
  Problem: Acute Rehab OT Goals (only OT should resolve) Goal: Pt. Will Perform Grooming Flowsheets (Taken 09/11/2023 1347) Pt Will Perform Grooming:  with set-up  with contact guard assist  sitting Goal: Pt. Will Perform Upper Body Dressing Flowsheets (Taken 09/11/2023 1347) Pt Will Perform Upper Body Dressing:  with contact guard assist  sitting Goal: Pt. Will Perform Lower Body Dressing Flowsheets (Taken 09/11/2023 1347) Pt Will Perform Lower Body Dressing:  with min assist  with contact guard assist  sitting/lateral leans Goal: Pt. Will Transfer To Toilet Flowsheets (Taken 09/11/2023 1347) Pt Will Transfer to Toilet:  with min assist  stand pivot transfer Goal: Pt. Will Perform Toileting-Clothing Manipulation Flowsheets (Taken 09/11/2023 1347) Pt Will Perform Toileting - Clothing Manipulation and hygiene:  with min assist  bed level Goal: Pt/Caregiver Will Perform Home Exercise Program Flowsheets (Taken 09/11/2023 1347) Pt/caregiver will Perform Home Exercise Program:  Increased strength  Increased ROM  Both right and left upper extremity  With minimal assist  Jazlynne Milliner OT, MOT

## 2023-09-11 NOTE — Progress Notes (Signed)
 Physical Therapy Treatment Patient Details Name: Nathan Shepherd MRN: [id number] DOB: 08/10/1955 Today's Date: 09/11/2023   History of Present Illness Nathan Shepherd is a 62 history of seizure disorder, chronic atrial fibrillation on warfarin, stroke, polysubstance abuse, hypertension, CAD, hyperlipidemia, CKD stage III presenting with generalized weakness.  The patient was recently discharged from the hospital after a stay from 09/04/2023 to 09/07/2023 secondary to rectal bleeding with severe hemorrhoidal.  The patient had a supratherapeutic INR and was given vitamin K  for reversal.  He was subsequently taken to the OR on 09/05/2023 for hemorrhoidectomy by Dr. Morna Shepherd.  Postoperatively, the patient remained stable and his hemoglobin remained stable.  He was discharged back to his group home.  However, in the morning of 09/08/2023, the patient began difficulty ambulating.  Apparently he was unable to walk to their medicine cabinet to go to get his medications.  The patient states that he has difficulty getting out of bed by himself because of generalized weakness.  He denies any dizziness or syncope.  He states that once he was able to stand up he was so weak he would just fall back onto the bed.  He denies hitting his head.  Because of his generalized weakness and difficulty ambulating, admission was requested.    PT Comments  Pt functioning level has decreased from evaluation completed on Friday.  PT now needs max assist to roll and is unable to sit on the edge of the bed without assist.  Recommend SNF    If plan is discharge home, recommend the following: A little help with bathing/dressing/bathroom;Help with stairs or ramp for entrance;Assistance with cooking/housework;A lot of help with walking and/or transfers   Can travel by private vehicle     No  Equipment Recommendations  None recommended by PT    Recommendations for Other Services       Precautions / Restrictions  Precautions Precautions: Fall Recall of Precautions/Restrictions: Impaired Restrictions Weight Bearing Restrictions Per Provider Order: No     Mobility  Bed Mobility Overal bed mobility: Needs Assistance Bed Mobility: Rolling Rolling: Max assist   Supine to sit:  (total)                       Communication Communication Communication: No apparent difficulties  Cognition Arousal: Lethargic Behavior During Therapy: WFL for tasks assessed/performed   PT - Cognitive impairments: No apparent impairments                         Following commands: Impaired Following commands impaired: Follows one step commands inconsistently    Cueing Cueing Techniques: Verbal cues, Tactile cues  Exercises General Exercises - Lower Extremity Ankle Circles/Pumps: AAROM, 10 reps Quad Sets: AROM, 5 reps Heel Slides: AAROM, 10 reps, Both Hip ABduction/ADduction: AAROM, 5 reps, Both    General Comments        Pertinent Vitals/Pain Pain Assessment Pain Assessment: Faces Faces Pain Scale: Hurts even more Pain Location: L LE with assisted movement Pain Descriptors / Indicators: Grimacing, Discomfort    Home Living Family/patient expects to be discharged to:: Assisted living                 Home Equipment: None Additional Comments: Pt from ALF but stated many times he does not want to go back there.    Prior Function            PT Goals (current goals can now be found  in the care plan section) Acute Rehab PT Goals PT Goal Formulation: Patient unable to participate in goal setting Time For Goal Achievement: 09/22/23 Potential to Achieve Goals: Fair Progress towards PT goals: Not progressing toward goals - comment    Frequency    Min 3X/week      PT Plan  PT will do better with co treat.         AM-PAC PT 6 Clicks Mobility   Outcome Measure  Help needed turning from your back to your side while in a flat bed without using bedrails?: A Lot Help  needed moving from lying on your back to sitting on the side of a flat bed without using bedrails?: Total   Help needed standing up from a chair using your arms (e.g., wheelchair or bedside chair)?: Total Help needed to walk in hospital room?: Total Help needed climbing 3-5 steps with a railing? : Total 6 Click Score: 6    End of Session   Activity Tolerance: Patient limited by lethargy;Patient limited by fatigue;Patient limited by pain Patient left: in bed;with call bell/phone within reach;with bed alarm set   PT Visit Diagnosis: Unsteadiness on feet (R26.81);Muscle weakness (generalized) (M62.81);Other abnormalities of gait and mobility (R26.89)     Time: 1458-1510 PT Time Calculation (min) (ACUTE ONLY): 12 min  Charges:    $Therapeutic Exercise: 8-22 mins                         Nathan Shepherd, PT CLT [phone]  09/11/2023, 3:16 PM

## 2023-09-12 DIAGNOSIS — N3000 Acute cystitis without hematuria: Secondary | ICD-10-CM | POA: Diagnosis not present

## 2023-09-12 DIAGNOSIS — R531 Weakness: Secondary | ICD-10-CM | POA: Diagnosis not present

## 2023-09-12 DIAGNOSIS — I482 Chronic atrial fibrillation, unspecified: Secondary | ICD-10-CM | POA: Diagnosis not present

## 2023-09-12 DIAGNOSIS — R509 Fever, unspecified: Secondary | ICD-10-CM | POA: Diagnosis not present

## 2023-09-12 LAB — COMPREHENSIVE METABOLIC PANEL WITH GFR
ALT: 13 U/L (ref 0–44)
AST: 24 U/L (ref 15–41)
Albumin: 2.2 g/dL — ABNORMAL LOW (ref 3.5–5.0)
Alkaline Phosphatase: 70 U/L (ref 38–126)
Anion gap: 9 (ref 5–15)
BUN: 34 mg/dL — ABNORMAL HIGH (ref 8–23)
CO2: 29 mmol/L (ref 22–32)
Calcium: 9.3 mg/dL (ref 8.9–10.3)
Chloride: 101 mmol/L (ref 98–111)
Creatinine, Ser: 1.39 mg/dL — ABNORMAL HIGH (ref 0.61–1.24)
GFR, Estimated: 56 mL/min — ABNORMAL LOW (ref >60)
Glucose, Bld: 115 mg/dL — ABNORMAL HIGH (ref 70–99)
Potassium: 4.5 mmol/L (ref 3.5–5.1)
Sodium: 139 mmol/L (ref 135–145)
Total Bilirubin: 0.7 mg/dL (ref 0.0–1.2)
Total Protein: 6.4 g/dL — ABNORMAL LOW (ref 6.5–8.1)

## 2023-09-12 LAB — CBC
HCT: 36 % — ABNORMAL LOW (ref 39.0–52.0)
Hemoglobin: 11.2 g/dL — ABNORMAL LOW (ref 13.0–17.0)
MCH: 27.9 pg (ref 26.0–34.0)
MCHC: 31.1 g/dL (ref 30.0–36.0)
MCV: 89.6 fL (ref 80.0–100.0)
Platelets: 197 K/uL (ref 150–400)
RBC: 4.02 MIL/uL — ABNORMAL LOW (ref 4.22–5.81)
RDW: 15.9 % — ABNORMAL HIGH (ref 11.5–15.5)
WBC: 7.5 K/uL (ref 4.0–10.5)
nRBC: 0 % (ref 0.0–0.2)

## 2023-09-12 LAB — PROTIME-INR
INR: 2.3 — ABNORMAL HIGH (ref 0.8–1.2)
Prothrombin Time: 26 s — ABNORMAL HIGH (ref 11.4–15.2)

## 2023-09-12 LAB — CORTISOL-AM, BLOOD: Cortisol - AM: 28.6 ug/dL — ABNORMAL HIGH (ref 6.7–22.6)

## 2023-09-12 LAB — AMMONIA: Ammonia: 24 umol/L (ref 9–35)

## 2023-09-12 LAB — CORTISOL: Cortisol, Plasma: 27.1 ug/dL

## 2023-09-12 LAB — MAGNESIUM: Magnesium: 2.3 mg/dL (ref 1.7–2.4)

## 2023-09-12 MED ORDER — LACTATED RINGERS IV SOLN: INTRAVENOUS | Status: AC

## 2023-09-12 MED ORDER — WARFARIN SODIUM 5 MG PO TABS
5.0000 mg | ORAL_TABLET | Freq: Once | ORAL | Status: AC
  Administered 2023-09-12: 5 mg via ORAL
  Filled 2023-09-12: qty 1

## 2023-09-12 NOTE — Progress Notes (Signed)
 Physical Therapy Treatment Patient Details Name: Nathan Shepherd MRN: [id number] DOB: 1955-04-25 Today's Date: 09/12/2023   History of Present Illness Nathan Shepherd is a 62 history of seizure disorder, chronic atrial fibrillation on warfarin, stroke, polysubstance abuse, hypertension, CAD, hyperlipidemia, CKD stage III presenting with generalized weakness.  The patient was recently discharged from the hospital after a stay from 09/04/2023 to 09/07/2023 secondary to rectal bleeding with severe hemorrhoidal.  The patient had a supratherapeutic INR and was given vitamin K  for reversal.  He was subsequently taken to the OR on 09/05/2023 for hemorrhoidectomy by Dr. Morna Pander.  Postoperatively, the patient remained stable and his hemoglobin remained stable.  He was discharged back to his group home.  However, in the morning of 09/08/2023, the patient began difficulty ambulating.  Apparently he was unable to walk to their medicine cabinet to go to get his medications.  The patient states that he has difficulty getting out of bed by himself because of generalized weakness.  He denies any dizziness or syncope.  He states that once he was able to stand up he was so weak he would just fall back onto the bed.  He denies hitting his head.  Because of his generalized weakness and difficulty ambulating, admission was requested.    PT Comments  Patient demonstrates slow labored movement for sitting up at bedside, once seated had frequent posterior leaning, unable come to complete standing using RW due to BLE weakness and required Max stand pivot for transferring to/from chair. Patient tolerated sitting up in chair after therapy - nursing staff aware. Patient will benefit from continued skilled physical therapy in hospital and recommended venue below to increase strength, balance, endurance for safe ADLs and gait.       If plan is discharge home, recommend the following: Help with stairs or ramp for  entrance;Assistance with cooking/housework;A lot of help with walking and/or transfers;A lot of help with bathing/dressing/bathroom   Can travel by private vehicle     No  Equipment Recommendations  None recommended by PT    Recommendations for Other Services       Precautions / Restrictions Precautions Precautions: Fall Recall of Precautions/Restrictions: Impaired Restrictions Weight Bearing Restrictions Per Provider Order: No     Mobility  Bed Mobility Overal bed mobility: Needs Assistance Bed Mobility: Rolling, Sidelying to Sit Rolling: Mod assist, Used rails, Max assist Sidelying to sit: Max assist, Total assist, HOB elevated       General bed mobility comments: slow labored movement requiring repeated verbal/tactile cueing    Transfers Overall transfer level: Needs assistance Equipment used: Rolling walker (2 wheels) Transfers: Sit to/from Stand, Bed to chair/wheelchair/BSC Sit to Stand: Max assist Stand pivot transfers: Max assist         General transfer comment: patient very weak with poor carryover for using RW, required stand pivot with knees block during transfers    Ambulation/Gait                   Stairs             Wheelchair Mobility     Tilt Bed    Modified Rankin (Stroke Patients Only)       Balance Overall balance assessment: Needs assistance Sitting-balance support: Feet supported, No upper extremity supported Sitting balance-Leahy Scale: Poor Sitting balance - Comments: seated at EOB; posterior lean   Standing balance support: Reliant on assistive device for balance, During functional activity, Bilateral upper extremity supported Standing balance-Leahy Scale:  Poor Standing balance comment: using RW                            Communication Communication Communication: No apparent difficulties  Cognition Arousal: Lethargic Behavior During Therapy: WFL for tasks assessed/performed   PT - Cognitive  impairments: No apparent impairments                         Following commands: Impaired Following commands impaired: Follows one step commands inconsistently, Follows one step commands with increased time    Cueing Cueing Techniques: Verbal cues, Tactile cues  Exercises      General Comments        Pertinent Vitals/Pain Pain Assessment Pain Assessment: Faces Faces Pain Scale: Hurts little more Pain Location: B LE with assisted movement Pain Descriptors / Indicators: Grimacing, Discomfort Pain Intervention(s): Limited activity within patient's tolerance, Monitored during session, Repositioned    Home Living Family/patient expects to be discharged to:: Assisted living                 Home Equipment: None      Prior Function            PT Goals (current goals can now be found in the care plan section) Acute Rehab PT Goals Patient Stated Goal: return home after rehab PT Goal Formulation: With patient Time For Goal Achievement: 09/22/23 Potential to Achieve Goals: Fair Progress towards PT goals: Progressing toward goals    Frequency    Min 3X/week      PT Plan      Co-evaluation PT/OT/SLP Co-Evaluation/Treatment: Yes Reason for Co-Treatment: To address functional/ADL transfers PT goals addressed during session: Mobility/safety with mobility;Balance;Proper use of DME OT goals addressed during session: ADL's and self-care      AM-PAC PT 6 Clicks Mobility   Outcome Measure  Help needed turning from your back to your side while in a flat bed without using bedrails?: A Lot Help needed moving from lying on your back to sitting on the side of a flat bed without using bedrails?: A Lot Help needed moving to and from a bed to a chair (including a wheelchair)?: A Lot Help needed standing up from a chair using your arms (e.g., wheelchair or bedside chair)?: A Lot Help needed to walk in hospital room?: Total Help needed climbing 3-5 steps with a  railing? : Total 6 Click Score: 10    End of Session Equipment Utilized During Treatment: Gait belt Activity Tolerance: Patient tolerated treatment well;Patient limited by fatigue;Patient limited by lethargy Patient left: in chair;with call bell/phone within reach;with chair alarm set Nurse Communication: Mobility status PT Visit Diagnosis: Unsteadiness on feet (R26.81);Muscle weakness (generalized) (M62.81);Other abnormalities of gait and mobility (R26.89)     Time: 9159-9082 PT Time Calculation (min) (ACUTE ONLY): 37 min  Charges:    $Therapeutic Activity: 23-37 mins PT General Charges $$ ACUTE PT VISIT: 1 Visit                     1:47 PM, 09/12/23 Lynwood Music, MPT Physical Therapist with [facility] [phone] office [phone] mobile phone

## 2023-09-12 NOTE — Progress Notes (Signed)
 PHARMACY - ANTICOAGULATION CONSULT NOTE  Pharmacy Consult for warfarin  Indication: atrial fibrillation  Allergies  Allergen Reactions   Penicillins Hives    Tolerated Zosyn     Patient Measurements: Height: 6' 4 (193 cm) Weight: 80 kg (176 lb 5.9 oz) IBW/kg (Calculated) : 86.8 HEPARIN  DW (KG): 80  Vital Signs: Temp: 97.5 F (36.4 C) (09/09 0618) Temp Source: Oral (09/09 0618) BP: 160/105 (09/09 0618) Pulse Rate: 109 (09/09 0618)  Labs: Recent Labs    09/10/23 0431 09/11/23 0442 09/12/23 0507  HGB 11.1* 10.2* 11.2*  HCT 34.6* 32.2* 36.0*  PLT 259 219 197  LABPROT 28.2* 32.4* 26.0*  INR 2.5* 3.0* 2.3*  CREATININE 1.20 1.37* 1.39*    Estimated Creatinine Clearance: 59.2 mL/min (A) (by C-G formula based on SCr of 1.39 mg/dL (H)).   Medical History: Past Medical History:  Diagnosis Date   CHF (congestive heart failure) (HCC)    CKD (chronic kidney disease) stage 3, GFR 30-59 ml/min (HCC)    Edema    Hyperlipemia    Hypertension    Seizures (HCC)    Assessment: 68 year old male with history of afib on chronic warfarin. INR 1.9 on admit.  Pharmacy asked to dose warfarin.  PTA home dose 7.5mg  daily  INR 1.9> 2.8> 2.5> 3>2.3 ,   Goal of Therapy:  INR 2-3 Monitor platelets by anticoagulation protocol: Yes   Plan:  Warfarin 5mg  po x 1 dose today PT-INR daily Monitor for S/S of bleeding  Elspeth Sour, PharmD Clinical Pharmacist 09/12/2023 8:28 AM

## 2023-09-12 NOTE — Progress Notes (Addendum)
 PROGRESS NOTE  Nathan Shepherd FMW:[id number] DOB: [birth year] DOA: 09/08/2023 PCP: Wilmon Penton, NP  Brief History:  68y/o history of seizure disorder, chronic atrial fibrillation on warfarin, stroke, polysubstance abuse, hypertension, CAD, hyperlipidemia, CKD stage III presenting with generalized weakness.  The patient was recently discharged from the hospital after a stay from 09/04/2023 to 09/07/2023 secondary to rectal bleeding with severe hemorrhoidal.  The patient had a supratherapeutic INR and was given vitamin K  for reversal.  He was subsequently taken to the OR on 09/05/2023 for hemorrhoidectomy by Dr. Morna Pander.  Postoperatively, the patient remained stable and his hemoglobin remained stable.  He was discharged back to his group home. However, in the morning of 09/08/2023, the patient began difficulty ambulating.  Apparently he was unable to walk to their medicine cabinet to go to get his medications.  The patient states that he has difficulty getting out of bed by himself because of generalized weakness.  He denies any dizziness or syncope.  He states that once he was able to stand up he was so weak he would just fall back onto the bed.  He denies hitting his head.  Because of his generalized weakness and difficulty ambulating, admission was requested. The patient does endorse dyspnea complaint.  He denies pain.  He denies any fevers, chills, cough, shortness breath, nausea, vomiting, abdominal pain. In the ED, the patient was afebrile and hemodynamically stable with oxygen saturation 98% on room air.  WBC 10.7, hemoglobin 9.9, platelet 273.  Sodium 139, potassium 4.2, bicarbonate 32, serum creatinine 1.43.  LFTs unremarkable.  EKG showed atrial fibrillation rate controlled with right bundle branch block.  Troponin 13.  Chest x-ray was negative for infiltrates or edema.  UA showed 21-50 WBC with nitrites.  The patient was empirically started on ceftriaxone . The patient had  intermittent periods of somnolence.  Workup was unremarkable.  Overall, the patient mental status gradually improved.   Assessment/Plan: Generalized weakness -Secondary to UTI - PT evaluation>>SNF - B12--760 - Folic acid--9.0 - TSH 2.714 - Hemoglobin stable - CK 76 - MR brain negative -am cortisol--28.6   Fever -Tmax 101.3 on 9/7, 102.2 on 9/8 -blood cultures x 2--neg to date -urine acid 5.2 -MR Lumbar spine--neg for acute findings, only DJD -transitioned to cephalexin  for UTI   Somnolence -conversant and follows commands when awake -MR brain neg -check ammonia (on depakote )--24 -depakote  level 64 -9/9 more awake, A&O x 2 which is likely his baseline conversant and follows commands   Rectal bleeding/melena related - Status post hemorrhoidectomy 09/05/2023 - Hemoglobin stable - Remains hemodynamically stable   UTI--E coli - UA 21-50WBC - Continue ceftriaxone >>cephalexin  - last dose cephalexin  09/13/23@1400    left leg weakness -pt unable to clarify how long he's had left leg weaknss -MR brain neg -MR lumbar spine--neg -suspect this is baseline;  no pain at hip or knee with hip or knee flexion   Chronic atrial fibrillation - Rate controlled - Continue warfarin - PharmD recommends d/c on lower dose (5 mg daily)   Essential hypertension - Continue carvedilol  - Previously on losartan  and amlodipine --not on it due to soft BPs   Mixed hyperlipidemia - Continue statin - CK 76   Seizure disorder - Continue Keppra  and depakote    CKD stage IIIa - Baseline creatinine 1.2-1.4 - restart IVF as po intake remains poor           Family Communication:   no Family at bedside  Consultants:  none   Code Status:  FULL    DVT Prophylaxis: warfarin     Procedures: As Listed in Progress Note Above   Antibiotics: Ceftriaxone  9/5>>9/8      Subjective: Patient denies fevers, chills, headache, chest pain, dyspnea, nausea, vomiting, diarrhea, abdominal pain,  dysuria, hematuria, hematochezia, and melena.   Objective: Vitals:   09/11/23 2047 09/12/23 0618 09/12/23 0749 09/12/23 1329  BP: 108/66 (!) 160/105  111/77  Pulse: 92 (!) 109  85  Resp: 19 18  20   Temp: 99.1 F (37.3 C) (!) 97.5 F (36.4 C)  99.4 F (37.4 C)  TempSrc: Oral Oral  Oral  SpO2: 96% 98% 94% 95%  Weight:      Height:        Intake/Output Summary (Last 24 hours) at 09/12/2023 1819 Last data filed at 09/12/2023 0433 Gross per 24 hour  Intake 1052.37 ml  Output 700 ml  Net 352.37 ml   Weight change:  Exam:  General:  Pt is alert, follows commands appropriately, not in acute distress HEENT: No icterus, No thrush, No neck mass, [place]/AT Cardiovascular: RRR, S1/S2, no rubs, no gallops Respiratory: bibasilar rales. No wheeze Abdomen: Soft/+BS, non tender, non distended, no guarding Extremities: No edema, No lymphangitis, No petechiae, No rashes, no synovitis   Data Reviewed: I have personally reviewed following labs and imaging studies Basic Metabolic Panel: Recent Labs  Lab 09/08/23 0946 09/09/23 0517 09/10/23 0431 09/11/23 0442 09/12/23 0507  NA 139 140 138 139 139  K 4.2 4.3 4.1 4.3 4.5  CL 98 102 100 104 101  CO2 32 30 28 31 29   GLUCOSE 98 105* 109* 109* 115*  BUN 29* 29* 25* 28* 34*  CREATININE 1.43* 1.26* 1.20 1.37* 1.39*  CALCIUM  9.4 9.1 9.0 8.9 9.3  MG  --  2.0 1.8 1.8 2.3   Liver Function Tests: Recent Labs  Lab 09/08/23 0946 09/12/23 0507  AST 13* 24  ALT 9 13  ALKPHOS 55 70  BILITOT 0.8 0.7  PROT 6.6 6.4*  ALBUMIN 2.4* 2.2*   No results for input(s): LIPASE, AMYLASE in the last 168 hours. Recent Labs  Lab 09/11/23 1803 09/12/23 0507  AMMONIA 25 24   Coagulation Profile: Recent Labs  Lab 09/08/23 0946 09/09/23 1137 09/10/23 0431 09/11/23 0442 09/12/23 0507  INR 1.9* 2.8* 2.5* 3.0* 2.3*   CBC: Recent Labs  Lab 09/06/23 0440 09/07/23 1043 09/08/23 0946 09/09/23 0517 09/10/23 0431 09/11/23 0442 09/12/23 0507  WBC  9.1   < > 10.7* 9.9 14.4* 11.2* 7.5  NEUTROABS 6.9  --  8.7*  --   --   --   --   HGB 11.7*   < > 11.9* 11.1* 11.1* 10.2* 11.2*  HCT 36.8*   < > 37.6* 34.2* 34.6* 32.2* 36.0*  MCV 89.1   < > 88.5 86.6 87.2 89.7 89.6  PLT 232   < > 273 242 259 219 197   < > = values in this interval not displayed.   Cardiac Enzymes: Recent Labs  Lab 09/08/23 0929  CKTOTAL 76   BNP: Invalid input(s): POCBNP CBG: No results for input(s): GLUCAP in the last 168 hours. HbA1C: No results for input(s): HGBA1C in the last 72 hours. Urine analysis:    Component Value Date/Time   COLORURINE YELLOW 09/08/2023 1229   APPEARANCEUR CLEAR 09/08/2023 1229   LABSPEC 1.016 09/08/2023 1229   PHURINE 7.0 09/08/2023 1229   GLUCOSEU NEGATIVE 09/08/2023 1229   HGBUR NEGATIVE 09/08/2023  1229   BILIRUBINUR NEGATIVE 09/08/2023 1229   KETONESUR NEGATIVE 09/08/2023 1229   PROTEINUR 30 (A) 09/08/2023 1229   NITRITE POSITIVE (A) 09/08/2023 1229   LEUKOCYTESUR MODERATE (A) 09/08/2023 1229   Sepsis Labs: @LABRCNTIP (procalcitonin:4,lacticidven:4) ) Recent Results (from the past 240 hours)  Resp panel by RT-PCR (RSV, Flu A&B, Covid) Anterior Nasal Swab     Status: None   Collection Time: 09/08/23  9:29 AM   Specimen: Anterior Nasal Swab  Result Value Ref Range Status   SARS Coronavirus 2 by RT PCR NEGATIVE NEGATIVE Final    Comment: (NOTE) SARS-CoV-2 target nucleic acids are NOT DETECTED.  The SARS-CoV-2 RNA is generally detectable in upper respiratory specimens during the acute phase of infection. The lowest concentration of SARS-CoV-2 viral copies this assay can detect is 138 copies/mL. A negative result does not preclude SARS-Cov-2 infection and should not be used as the sole basis for treatment or other patient management decisions. A negative result may occur with  improper specimen collection/handling, submission of specimen other than nasopharyngeal swab, presence of viral mutation(s) within  the areas targeted by this assay, and inadequate number of viral copies(<138 copies/mL). A negative result must be combined with clinical observations, patient history, and epidemiological information. The expected result is Negative.  Fact Sheet for Patients:  BloggerCourse.com  Fact Sheet for Healthcare Providers:  SeriousBroker.it  This test is no t yet approved or cleared by the [place]  FDA and  has been authorized for detection and/or diagnosis of SARS-CoV-2 by FDA under an Emergency Use Authorization (EUA). This EUA will remain  in effect (meaning this test can be used) for the duration of the COVID-19 declaration under Section 564(b)(1) of the Act, 21 U.S.C.section 360bbb-3(b)(1), unless the authorization is terminated  or revoked sooner.       Influenza A by PCR NEGATIVE NEGATIVE Final   Influenza B by PCR NEGATIVE NEGATIVE Final    Comment: (NOTE) The Xpert Xpress SARS-CoV-2/FLU/RSV plus assay is intended as an aid in the diagnosis of influenza from Nasopharyngeal swab specimens and should not be used as a sole basis for treatment. Nasal washings and aspirates are unacceptable for Xpert Xpress SARS-CoV-2/FLU/RSV testing.  Fact Sheet for Patients: BloggerCourse.com  Fact Sheet for Healthcare Providers: SeriousBroker.it  This test is not yet approved or cleared by the [place]  FDA and has been authorized for detection and/or diagnosis of SARS-CoV-2 by FDA under an Emergency Use Authorization (EUA). This EUA will remain in effect (meaning this test can be used) for the duration of the COVID-19 declaration under Section 564(b)(1) of the Act, 21 U.S.C. section 360bbb-3(b)(1), unless the authorization is terminated or revoked.     Resp Syncytial Virus by PCR NEGATIVE NEGATIVE Final    Comment: (NOTE) Fact Sheet for  Patients: BloggerCourse.com  Fact Sheet for Healthcare Providers: SeriousBroker.it  This test is not yet approved or cleared by the [place]  FDA and has been authorized for detection and/or diagnosis of SARS-CoV-2 by FDA under an Emergency Use Authorization (EUA). This EUA will remain in effect (meaning this test can be used) for the duration of the COVID-19 declaration under Section 564(b)(1) of the Act, 21 U.S.C. section 360bbb-3(b)(1), unless the authorization is terminated or revoked.  Performed at [facility], [address], [place], [place] [address]   Urine Culture (for pregnant, neutropenic or urologic patients or patients with an indwelling urinary catheter)     Status: Abnormal   Collection Time: 09/08/23 12:33 PM   Specimen: Urine, Clean  Catch  Result Value Ref Range Status   Specimen Description   Final    URINE, CLEAN CATCH Performed at [facility], [address] [place], [place] [address]    Special Requests   Final    NONE Performed at [facility], [address], [place], [place] [address]    Culture >=100,000 COLONIES/mL ESCHERICHIA COLI (A)  Final   Report Status 09/11/2023 FINAL  Final   Organism ID, Bacteria ESCHERICHIA COLI (A)  Final      Susceptibility   Escherichia coli - MIC*    AMPICILLIN >=32 RESISTANT Resistant     CEFAZOLIN (URINE) Value in next row Sensitive      2 SENSITIVEThis is a modified FDA-approved test that has been validated and its performance characteristics determined by the reporting laboratory.  This laboratory is certified under the Clinical Laboratory Improvement Amendments CLIA as qualified to perform high complexity clinical laboratory testing.    CEFEPIME  Value in next row Sensitive      2 SENSITIVEThis is a modified FDA-approved test that has been validated and its performance characteristics determined by the reporting laboratory.  This laboratory is certified under the  Clinical Laboratory Improvement Amendments CLIA as qualified to perform high complexity clinical laboratory testing.    ERTAPENEM Value in next row Sensitive      2 SENSITIVEThis is a modified FDA-approved test that has been validated and its performance characteristics determined by the reporting laboratory.  This laboratory is certified under the Clinical Laboratory Improvement Amendments CLIA as qualified to perform high complexity clinical laboratory testing.    CEFTRIAXONE  Value in next row Sensitive      2 SENSITIVEThis is a modified FDA-approved test that has been validated and its performance characteristics determined by the reporting laboratory.  This laboratory is certified under the Clinical Laboratory Improvement Amendments CLIA as qualified to perform high complexity clinical laboratory testing.    CIPROFLOXACIN Value in next row Sensitive      2 SENSITIVEThis is a modified FDA-approved test that has been validated and its performance characteristics determined by the reporting laboratory.  This laboratory is certified under the Clinical Laboratory Improvement Amendments CLIA as qualified to perform high complexity clinical laboratory testing.    GENTAMICIN Value in next row Sensitive      2 SENSITIVEThis is a modified FDA-approved test that has been validated and its performance characteristics determined by the reporting laboratory.  This laboratory is certified under the Clinical Laboratory Improvement Amendments CLIA as qualified to perform high complexity clinical laboratory testing.    NITROFURANTOIN Value in next row Sensitive      2 SENSITIVEThis is a modified FDA-approved test that has been validated and its performance characteristics determined by the reporting laboratory.  This laboratory is certified under the Clinical Laboratory Improvement Amendments CLIA as qualified to perform high complexity clinical laboratory testing.    TRIMETH/SULFA Value in next row Sensitive      2  SENSITIVEThis is a modified FDA-approved test that has been validated and its performance characteristics determined by the reporting laboratory.  This laboratory is certified under the Clinical Laboratory Improvement Amendments CLIA as qualified to perform high complexity clinical laboratory testing.    AMPICILLIN/SULBACTAM Value in next row Resistant      2 SENSITIVEThis is a modified FDA-approved test that has been validated and its performance characteristics determined by the reporting laboratory.  This laboratory is certified under the Clinical Laboratory Improvement Amendments CLIA as qualified to perform high complexity clinical laboratory  testing.    PIP/TAZO Value in next row Sensitive ug/mL     <=4 SENSITIVEThis is a modified FDA-approved test that has been validated and its performance characteristics determined by the reporting laboratory.  This laboratory is certified under the Clinical Laboratory Improvement Amendments CLIA as qualified to perform high complexity clinical laboratory testing.    MEROPENEM Value in next row Sensitive      <=4 SENSITIVEThis is a modified FDA-approved test that has been validated and its performance characteristics determined by the reporting laboratory.  This laboratory is certified under the Clinical Laboratory Improvement Amendments CLIA as qualified to perform high complexity clinical laboratory testing.    * >=100,000 COLONIES/mL ESCHERICHIA COLI  Culture, blood (Routine X 2) w Reflex to ID Panel     Status: None (Preliminary result)   Collection Time: 09/11/23  6:03 PM   Specimen: BLOOD  Result Value Ref Range Status   Specimen Description BLOOD LEFT ANTECUBITAL  Final   Special Requests AEROBIC BOTTLE ONLY Blood Culture adequate volume  Final   Culture   Final    NO GROWTH < 24 HOURS Performed at [facility], [address], [place], [place] [address]    Report Status PENDING  Incomplete  Culture, blood (Routine X 2) w Reflex to ID Panel      Status: None (Preliminary result)   Collection Time: 09/11/23  6:03 PM   Specimen: BLOOD  Result Value Ref Range Status   Specimen Description BLOOD BLOOD LEFT WRIST  Final   Special Requests AEROBIC BOTTLE ONLY Blood Culture adequate volume  Final   Culture   Final    NO GROWTH < 24 HOURS Performed at [facility], [address] [place], [place] [address]    Report Status PENDING  Incomplete     Scheduled Meds:  carvedilol   3.125 mg Oral BID WC   cephALEXin   500 mg Oral Q8H   divalproex   750 mg Oral Q12H   donepezil   5 mg Oral QHS   feeding supplement  237 mL Oral BID BM   fluticasone  furoate-vilanterol  1 puff Inhalation Daily   levETIRAcetam   1,000 mg Oral BID   pantoprazole   40 mg Oral Daily   polyethylene glycol  17 g Oral Q M,W,F   rosuvastatin   20 mg Oral QHS   senna-docusate  2 tablet Oral QHS   Vitamin D  (Ergocalciferol )  50,000 Units Oral Q Mon   Warfarin - Pharmacist Dosing Inpatient   Does not apply q1600   Continuous Infusions:  lactated ringers  75 mL/hr at 09/12/23 1724    Procedures/Studies: MR BRAIN WO CONTRAST Result Date: 09/10/2023 CLINICAL DATA:  Neuro deficit, acute, stroke suspected right leg weakness EXAM: MRI HEAD WITHOUT CONTRAST TECHNIQUE: Multiplanar, multiecho pulse sequences of the brain and surrounding structures were obtained without intravenous contrast. COMPARISON:  CT head February 19, 25. FINDINGS: Brain: No acute infarction, hemorrhage, hydrocephalus, extra-axial collection or mass lesion. Remote left PCA territory infarct. Moderate patchy T2/FLAIR hyperintensities in the white matter compatible with chronic microvascular ischemic disease. Vascular: Normal flow voids. Skull and upper cervical spine: Normal marrow signal. Sinuses/Orbits: Paranasal sinus mucosal thickening with opacified frontal sinuses, right maxillary sinus, and anterior ethmoid air cells. Other: No mastoid effusions. IMPRESSION: 1. No evidence of acute intracranial abnormality.  2. Remote left PCA territory infarct and chronic microvascular ischemic disease. 3. Severe paranasal sinus disease. Electronically Signed   By: Gilmore GORMAN Molt M.D.   On: 09/10/2023 18:38   MR LUMBAR SPINE WO CONTRAST  Result Date: 09/10/2023 CLINICAL DATA:  Left leg weakness. EXAM: MRI LUMBAR SPINE WITHOUT CONTRAST TECHNIQUE: Multiplanar, multisequence MR imaging of the lumbar spine was performed. No intravenous contrast was administered. COMPARISON:  None Available. FINDINGS: Segmentation:  Standard. Alignment:  Normal. Vertebrae: No fracture, suspicious marrow lesion, or significant marrow edema. Conus medullaris and cauda equina: Conus extends to the T12-L1 level. Conus and cauda equina appear normal. Paraspinal and other soft tissues: Unremarkable. Disc levels: T12-L1: Negative. L1-2: Disc desiccation and mild disc space narrowing. Mild disc bulging and mild facet hypertrophy without significant stenosis. L2-3: Minimal disc bulging without significant stenosis. L3-4: Disc desiccation and mild disc space narrowing. Disc bulging and moderate facet hypertrophy result in mild bilateral lateral recess stenosis and mild right neural foraminal stenosis without spinal stenosis. L4-5: Disc desiccation and mild disc space narrowing. Disc bulging and moderate facet hypertrophy result in moderate right greater than left lateral recess stenosis and mild-to-moderate bilateral neural foraminal stenosis with some potential to affect the L5 nerve roots bilaterally. No spinal stenosis. L5-S1: Normal disc.  Mild facet hypertrophy without stenosis. IMPRESSION: Multilevel lumbar disc and facet degeneration, most notable at L4-5 where there is moderate lateral recess and mild-to-moderate neural foraminal stenosis. Electronically Signed   By: Dasie Hamburg M.D.   On: 09/10/2023 18:35   DG Chest Port 1 View Result Date: 09/08/2023 CLINICAL DATA:  weakness EXAM: PORTABLE CHEST - 1 VIEW COMPARISON:  February 22, 2023 FINDINGS: No  focal airspace consolidation, pleural effusion, or pneumothorax. Moderate cardiomegaly. Aortic atherosclerosis. No acute fracture or destructive lesions. Multilevel thoracic osteophytosis. IMPRESSION: Redemonstrated cardiomegaly. Otherwise, no acute cardiopulmonary abnormality. Electronically Signed   By: Rogelia Myers M.D.   On: 09/08/2023 11:20    Alm Schneider, DO  [facility]  If 7PM-7AM, please contact night-coverage [email or web address] Password [email or web address] 09/12/2023, 6:19 PM   LOS: 1 day

## 2023-09-12 NOTE — Progress Notes (Signed)
 Occupational Therapy Treatment Patient Details Name: Nathan Shepherd MRN: [id number] DOB: December 13, 1955 Today's Date: 09/12/2023   History of present illness Nathan Shepherd is a 62 history of seizure disorder, chronic atrial fibrillation on warfarin, stroke, polysubstance abuse, hypertension, CAD, hyperlipidemia, CKD stage III presenting with generalized weakness.  The patient was recently discharged from the hospital after a stay from 09/04/2023 to 09/07/2023 secondary to rectal bleeding with severe hemorrhoidal.  The patient had a supratherapeutic INR and was given vitamin K  for reversal.  He was subsequently taken to the OR on 09/05/2023 for hemorrhoidectomy by Dr. Morna Pander.  Postoperatively, the patient remained stable and his hemoglobin remained stable.  He was discharged back to his group home.  However, in the morning of 09/08/2023, the patient began difficulty ambulating.  Apparently he was unable to walk to their medicine cabinet to go to get his medications.  The patient states that he has difficulty getting out of bed by himself because of generalized weakness.  He denies any dizziness or syncope.  He states that once he was able to stand up he was so weak he would just fall back onto the bed.  He denies hitting his head.  Because of his generalized weakness and difficulty ambulating, admission was requested.   OT comments  Pt agreeable to OT and PT co-treatment. Pt demonstrates continued poor sitting balance and need for max to total assist for bed mobility. Pt was able to transfer to the chair with max to total assist without AD for safety to block B LE. Pt was able to tolerate some AA/ROM of B UE for 2x5 reps each, but more P/ROM at times due to pt's fatigue, lethargy, and extreme weakness. Pt required total assist for peri-care at bed level as well today, but was able to roll from L to R in bed with mod A to max A. Pt left in the chair with call bell within reach and chair alarm set. Pt  will benefit from continued OT in the hospital and recommended venue below to increase strength, balance, and endurance for safe ADL's.         If plan is discharge home, recommend the following:  Two people to help with walking and/or transfers;A lot of help with bathing/dressing/bathroom;Assistance with cooking/housework;Help with stairs or ramp for entrance;Assist for transportation;Direct supervision/assist for medications management   Equipment Recommendations  None recommended by OT          Precautions / Restrictions Precautions Precautions: Fall Recall of Precautions/Restrictions: Impaired Restrictions Weight Bearing Restrictions Per Provider Order: No       Mobility Bed Mobility Overal bed mobility: Needs Assistance Bed Mobility: Rolling, Sidelying to Sit Rolling: Mod  to max assist, Used rails Sidelying to sit: Max assist, Total assist, HOB elevated       General bed mobility comments: Pt very weak needing much assist for sidelying to sit. Able to reach to railing to assist with rolling to L and R side in bed during peri-care.    Transfers Overall transfer level: Needs assistance Equipment used: Rolling walker (2 wheels) Transfers: Sit to/from Stand, Bed to chair/wheelchair/BSC Sit to Stand: Max assist, Total assist   Squat pivot transfers: Max assist, Total assist       General transfer comment: Attempted sit to stand with RW +2 without success. Graded to trasnfer to chair without RW +2 assist with max to total assist.     Balance Overall balance assessment: Needs assistance Sitting-balance support: Bilateral upper extremity  supported, Feet supported Sitting balance-Leahy Scale: Poor Sitting balance - Comments: seated at EOB; posterior lean Postural control: Posterior lean, Left lateral lean Standing balance support: During functional activity, Bilateral upper extremity supported Standing balance-Leahy Scale: Poor Standing balance comment: partial  stand only during squat pivot with poor balance                           ADL either performed or assessed with clinical judgement   ADL Overall ADL's : Needs assistance/impaired                             Toileting- Clothing Manipulation and Hygiene: Total assistance;Bed level Toileting - Clothing Manipulation Details (indicate cue type and reason): Assited in rolling by this OT while NT completed peri-care for the pt while supine.            Extremity/Trunk Assessment Upper Extremity Assessment Upper Extremity Assessment: Generalized weakness;RUE deficits/detail (2+/5 shoulder flexion today bilaterally.) RUE Deficits / Details: Slightly less than full P/ROM for shoulder abduction today.   Lower Extremity Assessment Lower Extremity Assessment: Defer to PT evaluation                         Communication Communication Communication: No apparent difficulties   Cognition Arousal: Lethargic Behavior During Therapy: WFL for tasks assessed/performed Cognition: Cognition impaired           Executive functioning impairment (select all impairments): Sequencing OT - Cognition Comments: Some difficulty following commands at times.                 Following commands: Impaired Following commands impaired: Follows one step commands inconsistently      Cueing   Cueing Techniques: Verbal cues, Tactile cues  Exercises Exercises: General Upper Extremity General Exercises - Upper Extremity Shoulder Flexion: AAROM, 10 reps, Both, Seated Shoulder ABduction: AAROM, 10 reps, Both, Seated                 Pertinent Vitals/ Pain       Pain Assessment Pain Assessment: Faces Faces Pain Scale: Hurts little more Pain Location: B LE with assisted movement Pain Descriptors / Indicators: Grimacing, Discomfort Pain Intervention(s): Limited activity within patient's tolerance, Monitored during session, Repositioned  Home Living Family/patient  expects to be discharged to:: Assisted living                             Home Equipment: None          Prior Functioning/Environment              Frequency  Min 3X/week        Progress Toward Goals  OT Goals(current goals can now be found in the care plan section)  Progress towards OT goals: Progressing toward goals  Acute Rehab OT Goals Patient Stated Goal: Go to a new facility OT Goal Formulation: With patient Time For Goal Achievement: 09/25/23 Potential to Achieve Goals: Good ADL Goals Pt Will Perform Grooming: with set-up;with contact guard assist;sitting Pt Will Perform Lower Body Bathing: Independently Pt Will Perform Upper Body Dressing: with contact guard assist;sitting Pt Will Perform Lower Body Dressing: with min assist;with contact guard assist;sitting/lateral leans Pt Will Transfer to Toilet: with min assist;stand pivot transfer Pt Will Perform Toileting - Clothing Manipulation and hygiene: with min assist;bed level Pt/caregiver will Perform  Home Exercise Program: Increased strength;Increased ROM;Both right and left upper extremity;With minimal assist  Plan      Co-evaluation    PT/OT/SLP Co-Evaluation/Treatment: Yes Reason for Co-Treatment: To address functional/ADL transfers   OT goals addressed during session: ADL's and self-care                          End of Session Equipment Utilized During Treatment: Gait belt;Rolling walker (2 wheels)  OT Visit Diagnosis: Unsteadiness on feet (R26.81);Other abnormalities of gait and mobility (R26.89);Muscle weakness (generalized) (M62.81);History of falling (Z91.81);Other symptoms and signs involving cognitive function   Activity Tolerance Patient limited by lethargy   Patient Left in chair;with call bell/phone within reach;with chair alarm set   Nurse Communication          Time: [phone] OT Time Calculation (min): 43 min  Charges: OT General Charges $OT Visit: 1  Visit OT Treatments $Therapeutic Exercise: 8-22 mins (One charge only due to much of the session being shared with PT.)  JAYSON PERSON OT, MOT   JAYSON PERSON 09/12/2023, 1:11 PM

## 2023-09-12 NOTE — Plan of Care (Signed)

## 2023-09-13 ENCOUNTER — Inpatient Hospital Stay (HOSPITAL_COMMUNITY)

## 2023-09-13 DIAGNOSIS — R531 Weakness: Secondary | ICD-10-CM | POA: Diagnosis not present

## 2023-09-13 LAB — BASIC METABOLIC PANEL WITH GFR
Anion gap: 5 (ref 5–15)
BUN: 40 mg/dL — ABNORMAL HIGH (ref 8–23)
CO2: 32 mmol/L (ref 22–32)
Calcium: 9.8 mg/dL (ref 8.9–10.3)
Chloride: 104 mmol/L (ref 98–111)
Creatinine, Ser: 1.44 mg/dL — ABNORMAL HIGH (ref 0.61–1.24)
GFR, Estimated: 54 mL/min — ABNORMAL LOW (ref >60)
Glucose, Bld: 105 mg/dL — ABNORMAL HIGH (ref 70–99)
Potassium: 5.3 mmol/L — ABNORMAL HIGH (ref 3.5–5.1)
Sodium: 141 mmol/L (ref 135–145)

## 2023-09-13 LAB — PROTIME-INR
INR: 2.3 — ABNORMAL HIGH (ref 0.8–1.2)
Prothrombin Time: 26.3 s — ABNORMAL HIGH (ref 11.4–15.2)

## 2023-09-13 LAB — MAGNESIUM: Magnesium: 2.3 mg/dL (ref 1.7–2.4)

## 2023-09-13 MED ORDER — WARFARIN SODIUM 5 MG PO TABS
5.0000 mg | ORAL_TABLET | Freq: Once | ORAL | Status: AC
  Administered 2023-09-13: 5 mg via ORAL
  Filled 2023-09-13: qty 1

## 2023-09-13 MED ORDER — SODIUM ZIRCONIUM CYCLOSILICATE 10 G PO PACK
10.0000 g | PACK | Freq: Once | ORAL | Status: AC
  Administered 2023-09-13: 10 g via ORAL
  Filled 2023-09-13: qty 1

## 2023-09-13 NOTE — Evaluation (Addendum)
 Clinical/Bedside Swallow Evaluation Patient Details  Name: Nathan Shepherd MRN: [id number] Date of Birth: 07-19-1955  Today's Date: 09/13/2023 Time: SLP Start Time (ACUTE ONLY): 1530 SLP Stop Time (ACUTE ONLY): 1547 SLP Time Calculation (min) (ACUTE ONLY): 17 min  Past Medical History:  Past Medical History:  Diagnosis Date   CHF (congestive heart failure) (HCC)    CKD (chronic kidney disease) stage 3, GFR 30-59 ml/min (HCC)    Edema    Hyperlipemia    Hypertension    Seizures (HCC)    Past Surgical History:  Past Surgical History:  Procedure Laterality Date   COLONOSCOPY N/A 03/24/2023   Procedure: COLONOSCOPY;  Surgeon: Cinderella [name], MD;  Location: [facility];  Service: Endoscopy;  Laterality: N/A;   ESOPHAGOGASTRODUODENOSCOPY N/A 03/24/2023   Procedure: EGD (ESOPHAGOGASTRODUODENOSCOPY);  Surgeon: Cinderella [name], MD;  Location: [facility];  Service: Endoscopy;  Laterality: N/A;   HEMORRHOID SURGERY N/A 09/05/2023   Procedure: EXTENSIVE HEMORRHOIDECTOMY;  Surgeon: [name] Manuelita BROCKS, MD;  Location: [facility];  Service: General;  Laterality: N/A;   NO PAST SURGERIES     HPI:  Nathan Shepherd is a 68 y.o. male with a history of seizure disorder, chronic atrial fibrillation on warfarin, stroke, polysubstance abuse, CAD, HTN, HLD, CKD stage III who presented to the ED 9/5 with generalized weakness from assisted living facility.  The patient was recently discharged from the hospital after a stay from 09/04/2023 to 09/07/2023 secondary to rectal bleeding with severe hemorrhoidal. The patient had a supratherapeutic INR and was given vitamin K  for reversal.  He was subsequently taken to the OR on 09/05/2023 for hemorrhoidectomy by Dr. Morna [name].  Postoperatively, the patient remained stable and his hemoglobin remained stable. He was discharged back to his group home. BSE requested    Assessment / Plan / Recommendation  Clinical Impression  Clinical swallowing  evaluation completed while Pt was sitting upright in bed. Note wet congested cough and wet vocal quality prior to PO trials. Note slightly increased wet vocal quality after trials of thin liquids; With trials of NTL Pt with possible slightly improved wet vocal quality however, Pt still has wet vocal quality with NTL. Subjectively difficult to assess if wet vocal quality is related to swallowing or baseline congestion. With solid textures note prolonged and non-productive chewing. ST will consider MBSS to objectively assess swallowing function. For now, recommend conservative diet downgrade diet to D2/fine chop and downgrade to NECTAR thick liquids. Meds are ok whole with liquids. ST will continue to follow, thank you, SLP Visit Diagnosis: Dysphagia, unspecified (R13.10)    Aspiration Risk  Mild aspiration risk;Moderate aspiration risk    Diet Recommendation D2/fine chop ; NECTAR thick liquids    Liquid Administration via: Cup;Straw Medication Administration: Whole meds with liquid Supervision: Patient able to self feed Compensations: Minimize environmental distractions;Slow rate;Small sips/bites Postural Changes: Seated upright at 90 degrees    Other  Recommendations Oral Care Recommendations: Oral care BID Caregiver Recommendations: Avoid jello, ice cream, thin soups, popsicles;Remove water pitcher           Frequency and Duration min 1 x/week  1 week       Prognosis Prognosis for improved oropharyngeal function: Fair      Swallow Study   General Date of Onset: 09/08/23 HPI: Nathan Shepherd is a 68 y.o. male with a history of seizure disorder, chronic atrial fibrillation on warfarin, stroke, polysubstance abuse, CAD, HTN, HLD, CKD stage III who presented to the ED  9/5 with generalized weakness from assisted living facility.  The patient was recently discharged from the hospital after a stay from 09/04/2023 to 09/07/2023 secondary to rectal bleeding with severe hemorrhoidal. The  patient had a supratherapeutic INR and was given vitamin K  for reversal.  He was subsequently taken to the OR on 09/05/2023 for hemorrhoidectomy by Dr. Morna Pander.  Postoperatively, the patient remained stable and his hemoglobin remained stable. He was discharged back to his group home. BSE requested Type of Study: Bedside Swallow Evaluation Previous Swallow Assessment: Most Recent BSE 10/24 - D2/thin Diet Prior to this Study: Regular;Thin liquids (Level 0) Respiratory Status: Room air History of Recent Intubation: No Behavior/Cognition: Alert;Cooperative;Pleasant mood Oral Cavity Assessment: Within Functional Limits Oral Care Completed by SLP: Recent completion by staff Oral Cavity - Dentition: Adequate natural dentition;Missing dentition Vision: Functional for self-feeding Self-Feeding Abilities: Able to feed self;Needs assist;Needs set up Patient Positioning: Upright in bed Baseline Vocal Quality: Normal Volitional Cough: Strong Volitional Swallow: Able to elicit    Oral/Motor/Sensory Function Overall Oral Motor/Sensory Function: Within functional limits   Ice Chips Ice chips: Within functional limits   Thin Liquid Thin Liquid: Impaired Presentation: Cup;Straw Pharyngeal  Phase Impairments: Wet Vocal Quality    Nectar Thick Nectar Thick Liquid: Impaired Presentation: Cup;Straw Pharyngeal Phase Impairments: Wet Vocal Quality   Honey Thick Honey Thick Liquid: Not tested   Puree Puree: Within functional limits   Solid     Solid: Within functional limits     Rawn Quiroa H. Clois KILLIAN, CCC-SLP Speech Language Pathologist  Raguel VEAR Clois 09/13/2023,3:47 PM

## 2023-09-13 NOTE — Plan of Care (Signed)

## 2023-09-13 NOTE — Progress Notes (Signed)
 [facility] PROGRESS NOTE  Nathan Shepherd (DOB: Feb 18, 1955) FMW:[id number] PCP: Nathan Penton, NP  Brief Narrative: Nathan Shepherd is a 68 y.o. male with a history of seizure disorder, chronic atrial fibrillation on warfarin, stroke, polysubstance abuse, CAD, HTN, HLD, CKD stage III who presented to the ED 9/5 with generalized weakness from assisted living facility.  The patient was recently discharged from the hospital after a stay from 09/04/2023 to 09/07/2023 secondary to rectal bleeding with severe hemorrhoidal. The patient had a supratherapeutic INR and was given vitamin K  for reversal.  He was subsequently taken to the OR on 09/05/2023 for hemorrhoidectomy by Dr. Morna Shepherd.  Postoperatively, the patient remained stable and his hemoglobin remained stable. He was discharged back to his group home.  However, in the morning of 09/08/2023, the patient developed difficulty ambulating, generalized weakness.  He denies any dizziness or syncope. In the ED, the patient was afebrile and hemodynamically stable with oxygen saturation 98% on room air.  WBC 10.7, hemoglobin 9.9, platelet 273.  Sodium 139, potassium 4.2, bicarbonate 32, serum creatinine 1.43.  LFTs unremarkable.  EKG showed atrial fibrillation rate controlled with right bundle branch block.  Troponin 13.  Chest x-ray was negative for infiltrates or edema.  UA showed nitrite-positive pyuria and subsequent urine culture revealed E. coli for which antibiotics have been continued.   He is having recurrent fevers with vital signs and work up otherwise reassuring. There is some concern for silent aspiration, so SLP formal evaluation is sought.   Subjective: Has no complaints, says he's ready to leave the hospital. didn't feel a fever this morning, doesn't have a cough or shortness of breath or any choking that he remembers. No urinary changes, no neck stiffness or pain.   Objective: BP 104/72 (BP Location: Left Arm)   Pulse 97    Temp 99 F (37.2 C) (Oral)   Resp 18   Ht 6' 4 (1.93 m)   Wt 80 kg   SpO2 93%   BMI 21.47 kg/m   Gen: Chronically ill-appearing male in no distress eating breakfast Pulm: Diminished, some crackles at L > R bases, no wheezes, nonlabored  CV: Irreg, no MRG.  GI: Soft, NT, ND, +BS  Neuro: Alert and interactive. No new focal deficits, though exam shows significant diffuse weakness x4 extremities, symmetric. Ext: Warm, no deformities. Negative homan's, no significant edema.  Skin: No open ulcers on visualized skin   Assessment & Plan: Generalized weakness suspected to be secondary to UTI - Will pursue SNF level rehabilitation once medically stable/afebrile.  - B12--760 - Folic acid--9.0 - TSH 2.714 - Hemoglobin stable, though recent blood loss anemia may have predisposed to symptoms.  - CK 76 - MR brain negative - AM cortisol sufficient.    Fever: Recurrent. 102.80F this AM, though asymptomatic. Overnight RN reported concern for aspiration. MR Lumbar spine--neg for acute findings, only DJD - Blood cultures negative, and has been on culture-proven abx.  - Repeat CXR - Would appreciate SLP evaluation/formal swallow study.    Somnolence: MR brain neg, ammonia wnl x2 (on depakote ). Depakote  level within range.   - Conversant and follows commands when awake   Rectal bleeding/melena: No active bleeding. S/p hemorrhoidectomy 09/05/2023 - Hemoglobin stable - Remains hemodynamically stable   Left leg weakness: Pt unable to clarify how long he's had left leg weakness. MR brain and MR lumbar spine without explanative finding.  - Continue PT  Chronic atrial fibrillation - Rate controlled - Continue warfarin, INR therapeutic.  Essential hypertension - Continue carvedilol  - Previously on losartan  and amlodipine    Mixed hyperlipidemia - Continue statin - CK 76   Seizure disorder - Continue keppra  and depakote    CKD stage IIIa: Cr near baseline in 1.2-1.4 range.  - Avoid  nephrotoxins.   Hyperkalemia:  - Mild, give lokelma .  - Check PVR.  - Recheck  Nathan KATHEE Come, MD [facility] [email or web address] 09/13/2023, 11:47 AM

## 2023-09-13 NOTE — Progress Notes (Signed)
 PHARMACY - ANTICOAGULATION CONSULT NOTE  Pharmacy Consult for warfarin  Indication: atrial fibrillation  Allergies  Allergen Reactions   Penicillins Hives    Tolerated Zosyn     Patient Measurements: Height: 6' 4 (193 cm) Weight: 80 kg (176 lb 5.9 oz) IBW/kg (Calculated) : 86.8 HEPARIN  DW (KG): 80  Vital Signs: Temp: 102.8 F (39.3 C) (09/10 0633) Temp Source: Oral (09/10 0633) BP: 161/97 (09/10 9366) Pulse Rate: 74 (09/10 0633)  Labs: Recent Labs    09/11/23 0442 09/12/23 0507 09/13/23 0442  HGB 10.2* 11.2*  --   HCT 32.2* 36.0*  --   PLT 219 197  --   LABPROT 32.4* 26.0* 26.3*  INR 3.0* 2.3* 2.3*  CREATININE 1.37* 1.39* 1.44*    Estimated Creatinine Clearance: 57.1 mL/min (A) (by C-G formula based on SCr of 1.44 mg/dL (H)).   Medical History: Past Medical History:  Diagnosis Date   CHF (congestive heart failure) (HCC)    CKD (chronic kidney disease) stage 3, GFR 30-59 ml/min (HCC)    Edema    Hyperlipemia    Hypertension    Seizures (HCC)    Assessment: 68 year old male with history of afib on chronic warfarin. INR 1.9 on admit.  Pharmacy asked to dose warfarin.  PTA home dose 7.5mg  daily  INR 1.9> 2.8> 2.5> 3>2.3 , 2.3  Goal of Therapy:  INR 2-3 Monitor platelets by anticoagulation protocol: Yes   Plan:  Warfarin 5mg  po x 1 dose today PT-INR daily Monitor for S/S of bleeding  Cherlyn Boers, BS Pharm D, BCPS Clinical Pharmacist 09/13/2023 7:45 AM

## 2023-09-14 DIAGNOSIS — R531 Weakness: Secondary | ICD-10-CM | POA: Diagnosis not present

## 2023-09-14 LAB — PROTIME-INR
INR: 2.8 — ABNORMAL HIGH (ref 0.8–1.2)
Prothrombin Time: 31.2 s — ABNORMAL HIGH (ref 11.4–15.2)

## 2023-09-14 MED ORDER — WARFARIN SODIUM 5 MG PO TABS: 5.0000 mg | ORAL_TABLET | Freq: Every day | ORAL | Status: DC

## 2023-09-14 MED ORDER — CEPHALEXIN 500 MG PO CAPS: 500.0000 mg | ORAL_CAPSULE | Freq: Three times a day (TID) | ORAL | Status: AC

## 2023-09-14 MED ORDER — WARFARIN SODIUM 2 MG PO TABS
4.0000 mg | ORAL_TABLET | Freq: Once | ORAL | Status: AC
  Administered 2023-09-14: 4 mg via ORAL
  Filled 2023-09-14: qty 2

## 2023-09-14 NOTE — Progress Notes (Signed)
 PHARMACY - ANTICOAGULATION CONSULT NOTE  Pharmacy Consult for warfarin  Indication: atrial fibrillation  Allergies  Allergen Reactions   Penicillins Hives    Tolerated Zosyn     Patient Measurements: Height: 6' 4 (193 cm) Weight: 80 kg (176 lb 5.9 oz) IBW/kg (Calculated) : 86.8 HEPARIN  DW (KG): 80  Vital Signs: Temp: 97.7 F (36.5 C) (09/11 0421) Temp Source: Oral (09/11 0421) BP: 123/85 (09/11 0421) Pulse Rate: 69 (09/11 0421)  Labs: Recent Labs    09/12/23 0507 09/13/23 0442 09/14/23 0522  HGB 11.2*  --   --   HCT 36.0*  --   --   PLT 197  --   --   LABPROT 26.0* 26.3* 31.2*  INR 2.3* 2.3* 2.8*  CREATININE 1.39* 1.44*  --     Estimated Creatinine Clearance: 57.1 mL/min (A) (by C-G formula based on SCr of 1.44 mg/dL (H)).   Medical History: Past Medical History:  Diagnosis Date   CHF (congestive heart failure) (HCC)    CKD (chronic kidney disease) stage 3, GFR 30-59 ml/min (HCC)    Edema    Hyperlipemia    Hypertension    Seizures (HCC)    Assessment: 68 year old male with history of afib on chronic warfarin. INR 1.9 on admit.  Pharmacy asked to dose warfarin.  PTA home dose 7.5mg  daily  INR 1.9> 2.8> 2.5> 3>2.3 , 2.3>2.8  Goal of Therapy:  INR 2-3 Monitor platelets by anticoagulation protocol: Yes   Plan:  Warfarin 4 mg po x 1 dose today PT-INR daily Monitor for S/S of bleeding  Elspeth Sour, PharmD Clinical Pharmacist 09/14/2023 8:27 AM

## 2023-09-14 NOTE — Discharge Summary (Addendum)
 Physician Discharge Summary   [name] MRN: [id number] DOB: 1955/04/09  Admit date:     09/08/2023  Discharge date: 09/14/23  Discharge Physician: [name]   PCP: Gammon, Chrystal, NP   Recommendations at discharge:  Continue BMP, CBC, and INR monitoring at [facility]. Note dose decrease of coumadin  to 5mg  daily.  Consider ongoing speech therapy evaluations, dysphagia 2 diet as below.  Follow up with general surgery as scheduled 9/18.   Discharge Diagnoses: Principal Problem:   Generalized weakness Active Problems:   Atrial fibrillation, chronic (HCC) - cannot be on DOAC due to concurrent treatment with Depakote .   Essential hypertension   Seizure (HCC) - cannot be on DOAC due to concurrent treatment with Depakote .   Mixed hyperlipidemia   Lower urinary tract infectious disease   Acute cystitis without hematuria   Somnolence   FUO (fever of unknown origin)  Hospital Course: Nathan Shepherd is a 68 y.o. male with a history of seizure disorder, chronic atrial fibrillation on warfarin, stroke, polysubstance abuse, CAD, HTN, HLD, CKD stage III who presented to the ED 9/5 with generalized weakness from assisted living facility.  The patient was recently discharged from the hospital after a stay from 09/04/2023 to 09/07/2023 secondary to rectal bleeding with severe hemorrhoidal. The patient had a supratherapeutic INR and was given vitamin K  for reversal.  He was subsequently taken to the OR on 09/05/2023 for hemorrhoidectomy by Dr. Morna Pander.  Postoperatively, the patient remained stable and his hemoglobin remained stable. He was discharged back to his group home.   However, in the morning of 09/08/2023, the patient developed difficulty ambulating, generalized weakness.  He denies any dizziness or syncope. In the ED, the patient was afebrile and hemodynamically stable with oxygen saturation 98% on room air.  WBC 10.7, hemoglobin 9.9, platelet 273.  Sodium 139, potassium  4.2, bicarbonate 32, serum creatinine 1.43.  LFTs unremarkable.  EKG showed atrial fibrillation rate controlled with right bundle branch block.  Troponin 13.  Chest x-ray was negative for infiltrates or edema.  UA showed nitrite-positive pyuria and subsequent urine culture revealed E. coli for which antibiotics have been continued.     He experienced recurrent fevers, though vital signs and work up otherwise reassuring. There is some concern for silent aspiration, so SLP evaluated and diet changed to dysphagia 2, nectar-thickened liquids with tolerance. He's remained afebrile > 24 hours, last WBC was normal, will complete antibiotic course at SNF.   Assessment and Plan: Generalized weakness suspected to be secondary to UTI - Will pursue SNF level rehabilitation once medically stable/afebrile.  - B12--760 - Folic acid--9.0 - TSH 2.714 - Hemoglobin stable, though recent blood loss anemia may have predisposed to symptoms.  - CK 76 - MR brain negative - AM cortisol sufficient.    Sepsis due to E. coli UTI:  - Sepsis physiology has resolved. Complete the course of 7 days with keflex  (final dose in PM of 9/11). tolerating this and Ceftriaxone  despite documented PCN allergy.   Fever: Last febrile ~36 hours prior to discharge and has been asymptomatic otherwise. Silent aspiration is a possibility. MR Lumbar spine--neg for acute findings, only DJD - Continue incentive spirometry and fever monitoring.   - Blood cultures negative, and has been on culture-proven abx.  - Repeat CXR with 2 views is negative, exam normalized. Would recommend continuing dysphagia diet as it's well tolerated. Consider ongoing SLP evaluations.     Somnolence: MR brain neg, ammonia wnl x2 (on depakote ).  Depakote  level within range.   - Conversant and follows commands when awake   Rectal bleeding/melena: No active bleeding. S/p hemorrhoidectomy 09/05/2023 - Follow up with Dr. [name] (surgery) 9/18.  - Hemoglobin stable -  Remains hemodynamically stable   Left leg weakness: Pt unable to clarify how long he's had left leg weakness. MR brain and MR lumbar spine without explanative finding.  - Continue PT at SNF   Chronic atrial fibrillation - Rate controlled - Continue warfarin, INR therapeutic.    Essential hypertension - Continue carvedilol  - Previously on losartan  and amlodipine    Mixed hyperlipidemia - Continue statin - CK 76   Seizure disorder - Continue keppra  and depakote   Cognitive impairment:  - Continue aricept .   CKD stage IIIa: Cr near baseline in 1.2-1.4 range.  - Avoid nephrotoxins.    Hyperkalemia:  - Mild, given lokelma . Cr stable. Suggest low potassium diet going forward. Will hold losartan  for now and restart torsemide   Consultants: None Procedures performed: None  Disposition: Skilled nursing facility Diet recommendation:  Dysphagia type 2 Nectar-thickened Liquid DISCHARGE MEDICATION: Allergies as of 09/14/2023       Reactions   Penicillins Hives   Tolerated Zosyn         Medication List     STOP taking these medications    enoxaparin  80 MG/0.8ML injection Commonly known as: LOVENOX        TAKE these medications    acetaminophen  325 MG tablet Commonly known as: TYLENOL  Take 650 mg by mouth every 4 (four) hours as needed for mild pain (pain score 1-3).   ANTACID PO Take 30 mLs by mouth 4 (four) times daily as needed (heartburn, indigestion). Antacid Suspension   carvedilol  3.125 MG tablet Commonly known as: Coreg  Take 1 tablet (3.125 mg total) by mouth 2 (two) times daily.   cephALEXin  500 MG capsule Commonly known as: KEFLEX  Take 1 capsule (500 mg total) by mouth every 8 (eight) hours for 1 day.   diphenhydrAMINE  25 MG tablet Commonly known as: BENADRYL  Take 25 mg by mouth every 6 (six) hours as needed for allergies or itching.   divalproex  250 MG DR tablet Commonly known as: DEPAKOTE  Take 3 tablets (750 mg total) by mouth every 12 (twelve)  hours.   donepezil  5 MG tablet Commonly known as: ARICEPT  Take 5 mg by mouth at bedtime.   eucerin lotion Apply 1 Application topically daily. (Apply to legs and feet)   feeding supplement Liqd Take 237 mLs by mouth 2 (two) times daily between meals.   fluocinolone 0.01 % cream Apply 1 application  topically 3 (three) times daily as needed (bug bites/rash).   levETIRAcetam  1000 MG tablet Commonly known as: KEPPRA  Take 1,000 mg by mouth 2 (two) times daily.   loperamide  2 MG capsule Commonly known as: IMODIUM  Take 1 capsule (2 mg total) by mouth as needed for diarrhea or loose stools.   omeprazole  20 MG capsule Commonly known as: PRILOSEC Take 1 capsule (20 mg total) by mouth 2 (two) times daily before a meal. Take 30 min before breakfast and 30 min before dinner   ondansetron  4 MG tablet Commonly known as: ZOFRAN  Take 1 tablet (4 mg total) by mouth every 6 (six) hours as needed for nausea.   polyethylene glycol 17 g packet Commonly known as: MiraLax  Take 17 g by mouth every Monday, Wednesday, and Friday.   rosuvastatin  20 MG tablet Commonly known as: CRESTOR  Take 20 mg by mouth at bedtime.   senna-docusate 8.6-50 MG tablet Commonly  known as: Senokot-S Take 2 tablets by mouth at bedtime.   Symbicort  160-4.5 MCG/ACT inhaler Generic drug: budesonide -formoterol  Inhale 2 puffs into the lungs in the morning and at bedtime.   torsemide  20 MG tablet Commonly known as: DEMADEX  Take 20 mg by mouth every morning.   Vitamin D  (Ergocalciferol ) 1.25 MG (50000 UNIT) Caps capsule Commonly known as: DRISDOL  Take 50,000 Units by mouth every Monday.   warfarin 5 MG tablet Commonly known as: COUMADIN  Take 1 tablet (5 mg total) by mouth daily. What changed:  medication strength how much to take when to take this additional instructions        Contact information for follow-up providers     Gammon, Chrystal, NP Follow up.   Specialty: Nurse Practitioner Contact  information: [address] [place] [address] [phone]         [name] Manuelita BROCKS, MD Follow up on 09/21/2023.   Specialty: General Surgery Contact information: [address] [place] [facility] [address] [phone]              Contact information for after-discharge care     Destination     [facility] .   Service: Skilled Nursing Contact information: [address] [place]  [address] [phone]                    Discharge Exam: Fredricka Weights   09/08/23 0921  Weight: 80 kg  BP 123/85 (BP Location: Left Arm)   Pulse 69   Temp 97.7 F (36.5 C) (Oral)   Resp 16   Ht 6' 4 (1.93 m)   Wt 80 kg   SpO2 97%   BMI 21.47 kg/m   Gen: Chronically ill-appearing male in no distress ate all of breakfast Pulm: Diminished, crackles resolved, no wheezes, nonlabored  CV: Irreg, no MRG.  GI: Soft, NT, ND, +BS  Neuro: Alert and interactive. No new focal deficits, though exam shows significant diffuse weakness x4 extremities, symmetric. Ext: Warm, no deformities. Negative homan's, no significant edema.  Skin: No open ulcers on visualized skin   Condition at discharge: stable  The results of significant diagnostics from this hospitalization (including imaging, microbiology, ancillary and laboratory) are listed below for reference.   Imaging Studies: DG Chest 2 View Result Date: 09/13/2023 CLINICAL DATA:  Fever of unknown origin. EXAM: CHEST - 2 VIEW COMPARISON:  September 08, 2023. FINDINGS: Stable cardiomegaly. Both lungs are clear. The visualized skeletal structures are unremarkable. IMPRESSION: No active cardiopulmonary disease. Electronically Signed   By: Lynwood Landy Raddle M.D.   On: 09/13/2023 15:37   MR BRAIN WO CONTRAST Result Date: 09/10/2023 CLINICAL DATA:  Neuro deficit, acute, stroke suspected right leg weakness EXAM: MRI HEAD WITHOUT CONTRAST TECHNIQUE: Multiplanar, multiecho pulse sequences of the brain and surrounding  structures were obtained without intravenous contrast. COMPARISON:  CT head February 19, 25. FINDINGS: Brain: No acute infarction, hemorrhage, hydrocephalus, extra-axial collection or mass lesion. Remote left PCA territory infarct. Moderate patchy T2/FLAIR hyperintensities in the white matter compatible with chronic microvascular ischemic disease. Vascular: Normal flow voids. Skull and upper cervical spine: Normal marrow signal. Sinuses/Orbits: Paranasal sinus mucosal thickening with opacified frontal sinuses, right maxillary sinus, and anterior ethmoid air cells. Other: No mastoid effusions. IMPRESSION: 1. No evidence of acute intracranial abnormality. 2. Remote left PCA territory infarct and chronic microvascular ischemic disease. 3. Severe paranasal sinus disease. Electronically Signed   By: Gilmore GORMAN Molt M.D.   On: 09/10/2023 18:38   MR LUMBAR SPINE  WO CONTRAST Result Date: 09/10/2023 CLINICAL DATA:  Left leg weakness. EXAM: MRI LUMBAR SPINE WITHOUT CONTRAST TECHNIQUE: Multiplanar, multisequence MR imaging of the lumbar spine was performed. No intravenous contrast was administered. COMPARISON:  None Available. FINDINGS: Segmentation:  Standard. Alignment:  Normal. Vertebrae: No fracture, suspicious marrow lesion, or significant marrow edema. Conus medullaris and cauda equina: Conus extends to the T12-L1 level. Conus and cauda equina appear normal. Paraspinal and other soft tissues: Unremarkable. Disc levels: T12-L1: Negative. L1-2: Disc desiccation and mild disc space narrowing. Mild disc bulging and mild facet hypertrophy without significant stenosis. L2-3: Minimal disc bulging without significant stenosis. L3-4: Disc desiccation and mild disc space narrowing. Disc bulging and moderate facet hypertrophy result in mild bilateral lateral recess stenosis and mild right neural foraminal stenosis without spinal stenosis. L4-5: Disc desiccation and mild disc space narrowing. Disc bulging and moderate facet  hypertrophy result in moderate right greater than left lateral recess stenosis and mild-to-moderate bilateral neural foraminal stenosis with some potential to affect the L5 nerve roots bilaterally. No spinal stenosis. L5-S1: Normal disc.  Mild facet hypertrophy without stenosis. IMPRESSION: Multilevel lumbar disc and facet degeneration, most notable at L4-5 where there is moderate lateral recess and mild-to-moderate neural foraminal stenosis. Electronically Signed   By: Dasie Hamburg M.D.   On: 09/10/2023 18:35   DG Chest Port 1 View Result Date: 09/08/2023 CLINICAL DATA:  weakness EXAM: PORTABLE CHEST - 1 VIEW COMPARISON:  February 22, 2023 FINDINGS: No focal airspace consolidation, pleural effusion, or pneumothorax. Moderate cardiomegaly. Aortic atherosclerosis. No acute fracture or destructive lesions. Multilevel thoracic osteophytosis. IMPRESSION: Redemonstrated cardiomegaly. Otherwise, no acute cardiopulmonary abnormality. Electronically Signed   By: Rogelia Myers M.D.   On: 09/08/2023 11:20    Microbiology: Results for orders placed or performed during the hospital encounter of 09/08/23  Resp panel by RT-PCR (RSV, Flu A&B, Covid) Anterior Nasal Swab     Status: None   Collection Time: 09/08/23  9:29 AM   Specimen: Anterior Nasal Swab  Result Value Ref Range Status   SARS Coronavirus 2 by RT PCR NEGATIVE NEGATIVE Final    Comment: (NOTE) SARS-CoV-2 target nucleic acids are NOT DETECTED.  The SARS-CoV-2 RNA is generally detectable in upper respiratory specimens during the acute phase of infection. The lowest concentration of SARS-CoV-2 viral copies this assay can detect is 138 copies/mL. A negative result does not preclude SARS-Cov-2 infection and should not be used as the sole basis for treatment or other patient management decisions. A negative result may occur with  improper specimen collection/handling, submission of specimen other than nasopharyngeal swab, presence of viral  mutation(s) within the areas targeted by this assay, and inadequate number of viral copies(<138 copies/mL). A negative result must be combined with clinical observations, patient history, and epidemiological information. The expected result is Negative.  Fact Sheet for Patients:  BloggerCourse.com  Fact Sheet for Healthcare Providers:  SeriousBroker.it  This test is no t yet approved or cleared by the [place]  FDA and  has been authorized for detection and/or diagnosis of SARS-CoV-2 by FDA under an Emergency Use Authorization (EUA). This EUA will remain  in effect (meaning this test can be used) for the duration of the COVID-19 declaration under Section 564(b)(1) of the Act, 21 U.S.C.section 360bbb-3(b)(1), unless the authorization is terminated  or revoked sooner.       Influenza A by PCR NEGATIVE NEGATIVE Final   Influenza B by PCR NEGATIVE NEGATIVE Final    Comment: (NOTE) The Xpert Xpress SARS-CoV-2/FLU/RSV plus  assay is intended as an aid in the diagnosis of influenza from Nasopharyngeal swab specimens and should not be used as a sole basis for treatment. Nasal washings and aspirates are unacceptable for Xpert Xpress SARS-CoV-2/FLU/RSV testing.  Fact Sheet for Patients: BloggerCourse.com  Fact Sheet for Healthcare Providers: SeriousBroker.it  This test is not yet approved or cleared by the [place]  FDA and has been authorized for detection and/or diagnosis of SARS-CoV-2 by FDA under an Emergency Use Authorization (EUA). This EUA will remain in effect (meaning this test can be used) for the duration of the COVID-19 declaration under Section 564(b)(1) of the Act, 21 U.S.C. section 360bbb-3(b)(1), unless the authorization is terminated or revoked.     Resp Syncytial Virus by PCR NEGATIVE NEGATIVE Final    Comment: (NOTE) Fact Sheet for  Patients: BloggerCourse.com  Fact Sheet for Healthcare Providers: SeriousBroker.it  This test is not yet approved or cleared by the [place]  FDA and has been authorized for detection and/or diagnosis of SARS-CoV-2 by FDA under an Emergency Use Authorization (EUA). This EUA will remain in effect (meaning this test can be used) for the duration of the COVID-19 declaration under Section 564(b)(1) of the Act, 21 U.S.C. section 360bbb-3(b)(1), unless the authorization is terminated or revoked.  Performed at [facility], [address], [place], [place] [address]   Urine Culture (for pregnant, neutropenic or urologic patients or patients with an indwelling urinary catheter)     Status: Abnormal   Collection Time: 09/08/23 12:33 PM   Specimen: Urine, Clean Catch  Result Value Ref Range Status   Specimen Description   Final    URINE, CLEAN CATCH Performed at [facility], [address] [place], [place] [address]    Special Requests   Final    NONE Performed at [facility], [address], [place], [place] [address]    Culture >=100,000 COLONIES/mL ESCHERICHIA COLI (A)  Final   Report Status 09/11/2023 FINAL  Final   Organism ID, Bacteria ESCHERICHIA COLI (A)  Final      Susceptibility   Escherichia coli - MIC*    AMPICILLIN >=32 RESISTANT Resistant     CEFAZOLIN (URINE) Value in next row Sensitive      2 SENSITIVEThis is a modified FDA-approved test that has been validated and its performance characteristics determined by the reporting laboratory.  This laboratory is certified under the Clinical Laboratory Improvement Amendments CLIA as qualified to perform high complexity clinical laboratory testing.    CEFEPIME  Value in next row Sensitive      2 SENSITIVEThis is a modified FDA-approved test that has been validated and its performance characteristics determined by the reporting laboratory.  This laboratory is certified under the  Clinical Laboratory Improvement Amendments CLIA as qualified to perform high complexity clinical laboratory testing.    ERTAPENEM Value in next row Sensitive      2 SENSITIVEThis is a modified FDA-approved test that has been validated and its performance characteristics determined by the reporting laboratory.  This laboratory is certified under the Clinical Laboratory Improvement Amendments CLIA as qualified to perform high complexity clinical laboratory testing.    CEFTRIAXONE  Value in next row Sensitive      2 SENSITIVEThis is a modified FDA-approved test that has been validated and its performance characteristics determined by the reporting laboratory.  This laboratory is certified under the Clinical Laboratory Improvement Amendments CLIA as qualified to perform high complexity clinical laboratory testing.    CIPROFLOXACIN Value in next row Sensitive  2 SENSITIVEThis is a modified FDA-approved test that has been validated and its performance characteristics determined by the reporting laboratory.  This laboratory is certified under the Clinical Laboratory Improvement Amendments CLIA as qualified to perform high complexity clinical laboratory testing.    GENTAMICIN Value in next row Sensitive      2 SENSITIVEThis is a modified FDA-approved test that has been validated and its performance characteristics determined by the reporting laboratory.  This laboratory is certified under the Clinical Laboratory Improvement Amendments CLIA as qualified to perform high complexity clinical laboratory testing.    NITROFURANTOIN Value in next row Sensitive      2 SENSITIVEThis is a modified FDA-approved test that has been validated and its performance characteristics determined by the reporting laboratory.  This laboratory is certified under the Clinical Laboratory Improvement Amendments CLIA as qualified to perform high complexity clinical laboratory testing.    TRIMETH/SULFA Value in next row Sensitive      2  SENSITIVEThis is a modified FDA-approved test that has been validated and its performance characteristics determined by the reporting laboratory.  This laboratory is certified under the Clinical Laboratory Improvement Amendments CLIA as qualified to perform high complexity clinical laboratory testing.    AMPICILLIN/SULBACTAM Value in next row Resistant      2 SENSITIVEThis is a modified FDA-approved test that has been validated and its performance characteristics determined by the reporting laboratory.  This laboratory is certified under the Clinical Laboratory Improvement Amendments CLIA as qualified to perform high complexity clinical laboratory testing.    PIP/TAZO Value in next row Sensitive ug/mL     <=4 SENSITIVEThis is a modified FDA-approved test that has been validated and its performance characteristics determined by the reporting laboratory.  This laboratory is certified under the Clinical Laboratory Improvement Amendments CLIA as qualified to perform high complexity clinical laboratory testing.    MEROPENEM Value in next row Sensitive      <=4 SENSITIVEThis is a modified FDA-approved test that has been validated and its performance characteristics determined by the reporting laboratory.  This laboratory is certified under the Clinical Laboratory Improvement Amendments CLIA as qualified to perform high complexity clinical laboratory testing.    * >=100,000 COLONIES/mL ESCHERICHIA COLI  Culture, blood (Routine X 2) w Reflex to ID Panel     Status: None (Preliminary result)   Collection Time: 09/11/23  6:03 PM   Specimen: BLOOD  Result Value Ref Range Status   Specimen Description BLOOD LEFT ANTECUBITAL  Final   Special Requests AEROBIC BOTTLE ONLY Blood Culture adequate volume  Final   Culture   Final    NO GROWTH 3 DAYS Performed at [facility], [address], [place], [place] [address]    Report Status PENDING  Incomplete  Culture, blood (Routine X 2) w Reflex to ID Panel     Status:  None (Preliminary result)   Collection Time: 09/11/23  6:03 PM   Specimen: BLOOD  Result Value Ref Range Status   Specimen Description BLOOD BLOOD LEFT WRIST  Final   Special Requests AEROBIC BOTTLE ONLY Blood Culture adequate volume  Final   Culture   Final    NO GROWTH 3 DAYS Performed at [facility], [address], [place], [place] [address]    Report Status PENDING  Incomplete    Labs: CBC: Recent Labs  Lab 09/08/23 0946 09/09/23 0517 09/10/23 0431 09/11/23 0442 09/12/23 0507  WBC 10.7* 9.9 14.4* 11.2* 7.5  NEUTROABS 8.7*  --   --   --   --  HGB 11.9* 11.1* 11.1* 10.2* 11.2*  HCT 37.6* 34.2* 34.6* 32.2* 36.0*  MCV 88.5 86.6 87.2 89.7 89.6  PLT 273 242 259 219 197   Basic Metabolic Panel: Recent Labs  Lab 09/09/23 0517 09/10/23 0431 09/11/23 0442 09/12/23 0507 09/13/23 0442  NA 140 138 139 139 141  K 4.3 4.1 4.3 4.5 5.3*  CL 102 100 104 101 104  CO2 30 28 31 29  32  GLUCOSE 105* 109* 109* 115* 105*  BUN 29* 25* 28* 34* 40*  CREATININE 1.26* 1.20 1.37* 1.39* 1.44*  CALCIUM  9.1 9.0 8.9 9.3 9.8  MG 2.0 1.8 1.8 2.3 2.3   Liver Function Tests: Recent Labs  Lab 09/08/23 0946 09/12/23 0507  AST 13* 24  ALT 9 13  ALKPHOS 55 70  BILITOT 0.8 0.7  PROT 6.6 6.4*  ALBUMIN 2.4* 2.2*   CBG: No results for input(s): GLUCAP in the last 168 hours.  Discharge time spent: greater than 30 minutes.  Signed: [name], MD [facility] 09/14/2023

## 2023-09-14 NOTE — Progress Notes (Signed)
 Verbal report given to Anisa accepting nurse at accepting facility.

## 2023-09-14 NOTE — Plan of Care (Signed)
   Problem: Health Behavior/Discharge Planning: Goal: Ability to manage health-related needs will improve Outcome: Progressing

## 2023-09-14 NOTE — Care Management Important Message (Signed)
 Important Message  Patient Details  Name: Nathan Shepherd MRN: [id number] Date of Birth: Aug 24, 1955   Important Message Given:  Yes - Medicare IM (reviewed letter telephonically with legal guardian Ula Bare at [phone])     Duwaine LITTIE Ada 09/14/2023, 11:27 AM

## 2023-09-14 NOTE — Plan of Care (Signed)

## 2023-09-14 NOTE — TOC Transition Note (Signed)
 Transition of Care [facility]) - Discharge Note   Patient Details  Name: Nathan Shepherd MRN: [id number] Date of Birth: [birth year]  Transition [facility]) CM/SW Contact:  Mcarthur Saddie Kim, [name] Phone Number: 09/14/2023, 10:42 AM   Clinical Narrative: Pt d/c today to [facility]. Pt's guardian and facility aware and agreeable. Will transport via [employer]. D/C summary sent to SNF. RN given number to call report.       Final next level of care: Skilled Nursing Facility Barriers to Discharge: Barriers Resolved   Patient Goals and CMS Choice Patient states their goals for this hospitalization and ongoing recovery are:: return back to ALF - CMS Medicare.gov Compare Post Acute Care list provided to:: Legal Guardian        Discharge Placement              Patient chooses bed at: Other - please specify in the comment section below: [facility]) Patient to be transferred to facility by: [facility] Name of family member notified: legal guardian Patient and family notified of of transfer: 09/14/23  Discharge Plan and Services Additional resources added to the After Visit Summary for       Post Acute Care Choice: Resumption of Svcs/PTA Provider                    HH Arranged: PT, OT HH Agency: [facility]        Social Drivers of Health (SDOH) Interventions SDOH Screenings   Food Insecurity: No Food Insecurity (09/08/2023)  Housing: Low Risk  (09/08/2023)  Transportation Needs: No Transportation Needs (09/08/2023)  Utilities: Not At Risk (09/08/2023)  Social Connections: Moderately Isolated (09/08/2023)  Tobacco Use: Low Risk  (09/08/2023)     Readmission Risk Interventions    09/12/2023    7:53 AM  Readmission Risk Prevention Plan  Transportation Screening Complete  HRI or Home Care Consult Complete  Social Work Consult for Recovery Care Planning/Counseling Complete  Palliative Care Screening Not Applicable  Medication  Review Oceanographer) Complete

## 2023-09-15 DIAGNOSIS — R531 Weakness: Secondary | ICD-10-CM | POA: Diagnosis not present

## 2023-09-15 LAB — PROTIME-INR
INR: 2 — ABNORMAL HIGH (ref 0.8–1.2)
Prothrombin Time: 23.5 s — ABNORMAL HIGH (ref 11.4–15.2)

## 2023-09-15 MED ORDER — ACETAMINOPHEN 325 MG PO TABS: 650.0000 mg | ORAL_TABLET | Freq: Four times a day (QID) | ORAL | Status: DC

## 2023-09-15 MED ORDER — ACETAMINOPHEN 650 MG RE SUPP
650.0000 mg | Freq: Four times a day (QID) | RECTAL | Status: DC
Start: 2023-09-15 — End: 2023-09-15

## 2023-09-15 MED ORDER — WARFARIN SODIUM 7.5 MG PO TABS: 7.5000 mg | ORAL_TABLET | Freq: Once | ORAL | Status: DC

## 2023-09-15 NOTE — Progress Notes (Signed)
 Patient discharged from hospital to SNF yesterday, though remains in room 329. Checked on him this morning. He's hungry as breakfast hasn't arrived yet, otherwise feels well. Hasn't had any further fevers. Remains stable for transfer to SNF with eventual plan to return to group home. He is eager to go. The discharge summary from yesterday remains accurate regarding medical assessment and plans.   Bernardino Come, MD 09/15/2023 8:51 AM

## 2023-09-15 NOTE — Plan of Care (Signed)
  Problem: Clinical Measurements: Goal: Ability to maintain clinical measurements within normal limits will improve Outcome: Progressing Goal: Diagnostic test results will improve Outcome: Progressing   

## 2023-09-15 NOTE — Progress Notes (Signed)
 PHARMACY - ANTICOAGULATION CONSULT NOTE  Pharmacy Consult for warfarin  Indication: atrial fibrillation  Allergies  Allergen Reactions   Penicillins Hives    Tolerated Zosyn     Patient Measurements: Height: 6' 4 (193 cm) Weight: 80 kg (176 lb 5.9 oz) IBW/kg (Calculated) : 86.8 HEPARIN  DW (KG): 80  Vital Signs: Temp: 99.6 F (37.6 C) (09/12 0553) Temp Source: Oral (09/12 0553) BP: 138/98 (09/12 0553) Pulse Rate: 96 (09/12 0553)  Labs: Recent Labs    09/13/23 0442 09/14/23 0522 09/15/23 0552  LABPROT 26.3* 31.2* 23.5*  INR 2.3* 2.8* 2.0*  CREATININE 1.44*  --   --     Estimated Creatinine Clearance: 57.1 mL/min (A) (by C-G formula based on SCr of 1.44 mg/dL (H)).   Medical History: Past Medical History:  Diagnosis Date   CHF (congestive heart failure) (HCC)    CKD (chronic kidney disease) stage 3, GFR 30-59 ml/min (HCC)    Edema    Hyperlipemia    Hypertension    Seizures (HCC)    Assessment: 68 year old male with history of afib on chronic warfarin. INR 1.9 on admit.  Pharmacy asked to dose warfarin. INR at goal but down today.  PTA home dose 7.5mg  daily  INR 1.9> 2.8> 2.5> 3>2.3 , 2.3>2.8>2.0  Goal of Therapy:  INR 2-3 Monitor platelets by anticoagulation protocol: Yes   Plan:  Warfarin 7.5 mg po x 1 dose today if still here PT-INR daily Monitor for S/S of bleeding  Dempsey Blush PharmD., BCPS Clinical Pharmacist 09/15/2023 10:19 AM

## 2023-09-15 NOTE — TOC Transition Note (Signed)
 Transition of Care [facility]) - Discharge Note   Patient Details  Name: Nathan Shepherd MRN: [id number] Date of Birth: [birth year]  Transition of Care [facility]) CM/SW Contact:  Hoy DELENA Bigness, [name] Phone Number: 09/15/2023, 9:56 AM   Clinical Narrative:    EMS unable to transport pt to SNF yesterday. Pt on EMS list for pick up today.   Final next level of care: Skilled Nursing Facility Barriers to Discharge: Barriers Resolved   Patient Goals and CMS Choice Patient states their goals for this hospitalization and ongoing recovery are:: return back to ALF - CMS Medicare.gov Compare Post Acute Care list provided to:: Legal Guardian        Discharge Placement              Patient chooses bed at: Other - please specify in the comment section below: [facility]) Patient to be transferred to facility by: [facility] Name of family member notified: legal guardian Patient and family notified of of transfer: 09/14/23  Discharge Plan and Services Additional resources added to the After Visit Summary for       Post Acute Care Choice: Resumption of Svcs/PTA Provider                    HH Arranged: PT, OT HH Agency: [facility]        Social Drivers of Health (SDOH) Interventions SDOH Screenings   Food Insecurity: No Food Insecurity (09/08/2023)  Housing: Low Risk  (09/08/2023)  Transportation Needs: No Transportation Needs (09/08/2023)  Utilities: Not At Risk (09/08/2023)  Social Connections: Moderately Isolated (09/08/2023)  Tobacco Use: Low Risk  (09/08/2023)     Readmission Risk Interventions    09/12/2023    7:53 AM  Readmission Risk Prevention Plan  Transportation Screening Complete  HRI or Home Care Consult Complete  Social Work Consult for Recovery Care Planning/Counseling Complete  Palliative Care Screening Not Applicable  Medication Review Oceanographer) Complete

## 2023-09-16 LAB — CULTURE, BLOOD (ROUTINE X 2)
Culture: NO GROWTH
Culture: NO GROWTH
Special Requests: ADEQUATE
Special Requests: ADEQUATE

## 2023-09-17 ENCOUNTER — Encounter (HOSPITAL_COMMUNITY): Payer: Self-pay

## 2023-09-17 NOTE — ED Provider Notes (Addendum)
 1810 hrs. Assumed care shift change gentleman 68 year old hemorrhoidectomy recently at [facility] with significant blood loss.  He is a resident of a local rehab facility [place] hemoglobin was low General Was hypotensive was given blood and fluids.  Initial CT angiography is negative for any acute blood loss we will go ahead plan for hospital admission here at this facility  Meds reviewed.  Patient taking Coumadin .  We have not reverse the Coumadin  at this point..  Meds Most recent echo during May 2024 showing intact ejection fraction of 60 to 65%, with previous history of stroke atrial fibrillation anticoagulation as above hemoglobin was 10.2 approximate 6 days ago now 9.6  1840 hrs.  more information on this case apparently now radiology is calling no blush in the rectal area with possible perirectal abscess development.  Alois has poor IV access due to previous substance abuse he is on the Coumadin  I believe for atrial fibrillation not for any mechanical heart valve go ahead with a reversal with vitamin K  as well as Kcentra and plan to transfuse and transferred to [facility] for further manage  1900 hrs. Right IJ central line placed emergently tolerated well patient appears to be significantly volume depleted have ordered additional IV fluids bolus of fluids plus the reversal agents and the vitamin K  has been administered all discussed case with the ICU attending I also ordered broad-spectrum antibiotics for possible sepsis main issue I think is active bleeding in the rectal area shown on CT  2045 hrs. Case reviewed with ICU attending at [facility] they have agreed to accept the patient consult general surgery for further management and possibly urology  2220 hours. Patient did receive 2 units of blood repeat hemoglobin down to 6.8 we will going give him 2 more units he has had his reversal of anticoagulation completed he has good IV access.  Blood pressure has been good.   Awaiting transfer to suitable facility  Critical care: 90 min  2350 hrs. Late addendum case reviewed with the on-call critical care team at time of arrival no other change in status patient is not had hypotension continues to maintain his airway no other gross active bleeding at the current time third unit of blood started on routine

## 2023-09-17 NOTE — ED Notes (Signed)
 Baseline vital signs before start of blood transfusion.  Temp: 97.22f axillary BP: 96/67 HR: 72 bpm RR: 26 O2: 100% on room air  Vital signs 5 minutes after transfusion start: Temp: 97.25f axillary BP: 95/63 HR: 93 bpm RR: 26 O2: 100% on room air  Vital signs 10 minutes after transfusion start: Temp: 97.22f axillary BP: 86/63 HR: 80bpm RR: 18 O2: 100%  Vital signs 15 minutes after transfusion start: Temp: 97.21f axillary BP: 90/60 HR: 90 bpm RR: 18 O2: 97% on room air

## 2023-09-17 NOTE — ED Triage Notes (Signed)
 Patient BIB EMS from [facility] for evaluation of rectal bleeding. Per EMS, patient was released from [facility] on Friday after having a hemorrhoidectomy. Per EMS, staff was changing patient today and noticed profuse rectal bleeding. EMS tried to bring patient back to [facility] but was told they are at capacity and would be waiting.

## 2023-09-17 NOTE — ED Provider Notes (Signed)
 Emergency Department Provider Note    ED Clinical Impression   Final diagnoses:  Rectal bleeding (Primary)  Coagulopathy (HHS-HCC)    ED Assessment/Plan    Condition: Stable Disposition: Pending  This chart has been completed using Engineer, civil (consulting) software, and while attempts have been made to ensure accuracy, certain words and phrases may not be transcribed as intended.   History   Chief Complaint  Patient presents with  . Rectal Bleeding   HPI  Nathan Shepherd is a 68 y.o. male  who presents today to the  emergency department complaining of rectal bleeding.  Patient had a hemorrhoidectomy done at [facility] on September 2.  He was discharged on Friday back to nursing facility.  Today, nursing home staff noticed a significant amount of blood in his diaper as he was changing him.  Patient otherwise has no complaints.  Patient is on warfarin for atrial fibrillation.    Allergies: is allergic to penicillins. Medications: is not on any long-term medications. PMHx:  has no past medical history on file. PSHx:  has no past surgical history on file. SocHx:   Allergies, Medications, Medical, Surgical, and Social History were reviewed as documented above.   Social Drivers of Health with Concerns   Alcohol Use: Not on file  Housing: Not on file  Physical Activity: Not on file  Stress: Not on file  Interpersonal Safety: Not on file  Substance Use: Not on file (09/17/2023)  Social Connections: Moderately Isolated (09/08/2023)   Received from [facility]   Social Connection and Isolation Panel   . In a typical week, how many times do you talk on the phone with family, friends, or neighbors?: Three times a week   . How often do you get together with friends or relatives?: Three times a week   . How often do you attend church or religious services?: Never   . Do you belong to  any clubs or organizations such as church groups, unions, fraternal or athletic groups, or school groups?: Yes   . How often do you attend meetings of the clubs or organizations you belong to?: Never   . Are you married, widowed, divorced, separated, never married, or living with a partner?: Never married  Programmer, applications: Not on file  Health Literacy: Not on file  Internet Connectivity: Not on file     Review Of Systems  Review of Systems  Constitutional:  Negative for fever.  HENT:  Negative for congestion.   Respiratory:  Negative for chest tightness and shortness of breath.   Cardiovascular:  Negative for chest pain.  Gastrointestinal:  Positive for blood in stool. Negative for abdominal pain.  Skin:  Negative for color change.  Psychiatric/Behavioral:  Negative for behavioral problems.   All other systems reviewed and are negative.   Physical Exam   BP 81/64   Pulse 77   Temp 36.6 C (97.9 F) (Oral)   Resp 29   SpO2 100%   Physical Exam Vitals and nursing note reviewed.  Constitutional:      General: He is not in acute distress. HENT:     Head: Normocephalic.  Eyes:     Conjunctiva/sclera: Conjunctivae normal.  Cardiovascular:     Rate and Rhythm: Regular rhythm.     Pulses: Normal  pulses.     Heart sounds: Normal heart sounds.  Pulmonary:     Effort: No respiratory distress.     Breath sounds: Normal breath sounds.  Abdominal:     General: There is no distension.     Tenderness: There is no abdominal tenderness. There is no guarding or rebound.  Genitourinary:    Comments: There was a significant amount of blood in his diaper.  Once we cleaned the patient up, the surgery site actually looks pretty good.  I did not see any active bleeding from the surgery site. Musculoskeletal:        General: No deformity.  Skin:    General: Skin is warm.     Capillary Refill: Capillary refill takes 2 to 3 seconds.     Comments: Normal cap refill.   Neurological:     General: No focal deficit present.  Psychiatric:        Mood and Affect: Mood normal.     ED Course  Medical Decision Making Differential diagnosis includes postoperative bleeding versus coagulopathy versus less likely polyps or other source of lower GI bleed.  5:24 PM Labs reviewed.  Hemoglobin is 9.6.  Patient is supratherapeutic and INR.  Will go ahead and hold his warfarin.  Since he is not taking it is a life-threatening bleed, there is no indication for Kcentra or IV vitamin K  at this time.  I think we can hold Coumadin  and watch patient closely.  Patient's blood pressure is soft so we will going and transfuse 1 unit of packed red blood cells given the amount of blood that he saw earlier.  Will get CTA to rule out any evidence of active bleeding.  6:15 PM I have signed out patient to Dr. Dallara. We have reviewed patient's presentation, history, physical, PMH, testing so far, treatment so far, and pending testing plus disposition plan.      Procedures   No results found for this visit on 09/17/23 (from the past 4464 hours).   ED Results Results for orders placed or performed during the hospital encounter of 09/17/23  PTT  Result Value Ref Range   APTT 54.5 (H) 24.8 - 38.4 sec  PT-INR  Result Value Ref Range   PT 52.4 (H) 9.9 - 12.6 sec   INR 4.87 Undefined  Basic Metabolic Panel  Result Value Ref Range   Sodium 146 (H) 135 - 145 mmol/L   Potassium 4.4 3.5 - 5.0 mmol/L   Chloride 107 98 - 107 mmol/L   CO2 34.7 (H) 21.0 - 32.0 mmol/L   Anion Gap 4 3 - 11 mmol/L   BUN 37 (H) 8 - 20 mg/dL   Creatinine 8.52 (H) 9.19 - 1.30 mg/dL   BUN/Creatinine Ratio 25    eGFR CKD-EPI (2021) Male 52 (L) >=60 mL/min/1.73m2   Glucose 108 70 - 179 mg/dL   Calcium  9.2 8.5 - 10.1 mg/dL  Type and Screen with Confirmation ABORh  Result Value Ref Range   Blood Type B POS    Antibody Screen NEG   ABO/RH  Result Value Ref Range   Blood Type B POS   Prepare RBC   Result Value Ref Range   PRODUCT CODE [id number]    Product ID Red Blood Cells    Specimen Expiration Date [id number]    Status Transfused    Unit # [id number]    Unit Blood Type O Neg    ISBT Number 9500    Crossmatch Compatible   CBC w/  Differential  Result Value Ref Range   WBC 8.4 4.0 - 10.5 10*9/L   RBC 3.45 (L) 4.10 - 5.60 10*12/L   HGB 9.6 (L) 12.5 - 17.0 g/dL   HCT 70.4 (L) 63.9 - 49.9 %   MCV 85.5 80.0 - 98.0 fL   MCH 27.8 27.0 - 34.0 pg   MCHC 32.5 32.0 - 36.0 g/dL   RDW 84.4 (H) 88.4 - 85.4 %   MPV 12.3 (H) 7.4 - 10.4 fL   Platelet 228 140 - 415 10*9/L   Neutrophils % 72.8 %   Lymphocytes % 16.0 %   Monocytes % 9.9 %   Eosinophils % 0.1 %   Basophils % 0.2 %   Absolute Neutrophils 6.1 1.8 - 7.8 10*9/L   Absolute Lymphocytes 1.3 0.7 - 4.5 10*9/L   Absolute Monocytes 0.8 0.1 - 1.0 10*9/L   Absolute Eosinophils 0.0 0.0 - 0.4 10*9/L   Absolute Basophils 0.0 0.0 - 0.2 10*9/L   No results found.  Medications Administered:  Medications  sodium chloride  (NS) 0.9 % infusion (has no administration in time range)  sodium chloride  0.9% (NS) bolus 1,000 mL (1,000 mL Intravenous New Bag 09/17/23 1653)  iohexol  (OMNIPAQUE ) 350 mg iodine/mL solution 100 mL (100 mL Intravenous Given 09/17/23 1802)    Discharge Medications (Medications Prescribed during this  ED visit and Patient's Home Medications) :    Your Medication List    You have not been prescribed any medications.       Cherie Ardeen Hanger, MD 09/17/23 1816

## 2023-09-17 NOTE — ED Notes (Signed)
 Physician Order and Certification Statement for Non-Emergency Ambulance Services  SECTION I - GENERAL INFORMATION AND PHYSICIAN ORDER  Origin: [facility] Room: 001-TR/001-TR [address] [place] [address] Phone: [phone]  Destination: [facility]  Payer is not Medicare   SECTION II - MEDICAL NECESSITY QUESTIONNAIRE  Ambulance Transportation is medically necessary only if other means of transport are contraindicated or would be potentially harmful to the patient. To meet this requirement, the patient must be either bed confined or suffer from a condition such that transport by means other than ambulance is contraindicated by the patient's condition. The following questions must be answered by the medical professional signing below for this form to be valid: Describe the MEDICAL CONDITION (physical and/or mental) of this patient AT THE TIME OF AMBULANCE TRANSPORT that requires the patient to be transported in an ambulance and why transport by other means is contraindicated by the patient's condition:   Pt being transferred for higher level of care of GI Bleed post hemorrhoidectomy  Is this patient bed confined as defined below? No      To be bed confined the patient must satisfy all three of the following conditions: (1) unable to get up from bed without Assistance; AND (2) unable to ambulate; AND (3) unable to sit in a chair or wheelchair.  Can this patient safely be transported by car or wheelchair van (i.e., seated during transport, without a medical attendant or monitoring?)  No   In addition to completing questions 1-3 above, please check any of the following conditions that apply*: (*Note: supporting documentation for any boxes checked must be maintained in the patient's medical record.)  Cardiac/Hemodynamic monitoring during transport, IV meds/fluids required, and Medical attendant required to apply, administer or regulate or adjust oxygen (unable to self -administer)  SECTION  III - SIGNATURE OF PHYSICIAN OR  HEALTHCARE PROFESSIONAL  I certify that the above information is true and correct based on my current valuation of this patient, and represent that the patient requires transport by ambulance and that other form of transport are contraindicated. I understand that this information will be used by the Centers for Medicare and Medicaid Services (CMS) to support the determination of medical necessity for ambulance services, and I represent that I have personal knowledge of the patient's condition at the time of transport.  Patient is able to sign documents.  Financial risk analyst* - Registered Nurse: Jon LOISE Deal, RN 09/17/2023 10:40 PM Electronically Signed  Jon LOISE Deal, RN Printed Name and Credentials of Physician or Healthcare Professional (MD, DO, RN, etc.) *Form must be signed only by patient's attending physician for scheduled, repetitive transports. For non-repetitive, unscheduled ambulance transports, if unable to obtain the signature of the attending physician, any of the following may sign (please check appropriate box below. A physician order is required if form is signed by the registered nurse or discharge planner.  Note:  User bears all responsibility for compliance with all applicable laws and regulations.

## 2023-09-17 NOTE — ED Notes (Signed)
 ED Procedure Note  Central Line  Date/Time: 09/17/2023 7:01 PM  Performed by: [place] Norleen Agent, MD Authorized by: [place] Norleen Agent, MD   Consent:    Consent obtained:  Verbal   Consent given by:  Patient   Risks discussed:  Arterial puncture   Alternatives discussed:  No treatment Universal protocol:    Patient identity confirmed:  Verbally with patient Pre-procedure details:    Indication(s): central venous access     Skin preparation:  Alcohol and chlorhexidine  Sedation:    Sedation type:  None Anesthesia:    Anesthesia method:  None Procedure details:    Location:  R internal jugular   Patient position:  Trendelenburg   Procedural supplies:  Triple lumen   Catheter size:  7 Fr   Landmarks identified: yes     Ultrasound guidance: yes     Ultrasound guidance timing: prior to insertion     Number of attempts:  1   Successful placement: yes   Post-procedure details:    Post-procedure:  Dressing applied   Assessment:  Blood return through all ports   Procedure completion:  Tolerated well, no immediate complications Comments:     Right IJ central line tolerated well placed under emergent condition due to active bleeding and coagulopathy and hypotension

## 2023-09-18 ENCOUNTER — Inpatient Hospital Stay (HOSPITAL_COMMUNITY)
Admission: EM | Admit: 2023-09-18 | Discharge: 2023-09-24 | DRG: 987 | Disposition: A | Discharge status: Skilled Nursing Facility | Source: Skilled Nursing Facility | Attending: Internal Medicine | Admitting: Internal Medicine

## 2023-09-18 ENCOUNTER — Inpatient Hospital Stay (HOSPITAL_COMMUNITY)

## 2023-09-18 DIAGNOSIS — F0390 Unspecified dementia without behavioral disturbance: Secondary | ICD-10-CM | POA: Diagnosis present

## 2023-09-18 DIAGNOSIS — R791 Abnormal coagulation profile: Secondary | ICD-10-CM | POA: Diagnosis present

## 2023-09-18 DIAGNOSIS — N412 Abscess of prostate: Secondary | ICD-10-CM | POA: Diagnosis present

## 2023-09-18 DIAGNOSIS — I130 Hypertensive heart and chronic kidney disease with heart failure and stage 1 through stage 4 chronic kidney disease, or unspecified chronic kidney disease: Secondary | ICD-10-CM | POA: Diagnosis present

## 2023-09-18 DIAGNOSIS — R578 Other shock: Secondary | ICD-10-CM | POA: Diagnosis present

## 2023-09-18 DIAGNOSIS — Z79899 Other long term (current) drug therapy: Secondary | ICD-10-CM | POA: Diagnosis not present

## 2023-09-18 DIAGNOSIS — D72829 Elevated white blood cell count, unspecified: Secondary | ICD-10-CM | POA: Diagnosis present

## 2023-09-18 DIAGNOSIS — E43 Unspecified severe protein-calorie malnutrition: Secondary | ICD-10-CM | POA: Diagnosis present

## 2023-09-18 DIAGNOSIS — Z880 Allergy status to penicillin: Secondary | ICD-10-CM | POA: Diagnosis not present

## 2023-09-18 DIAGNOSIS — Z6820 Body mass index (BMI) 20.0-20.9, adult: Secondary | ICD-10-CM

## 2023-09-18 DIAGNOSIS — Z8673 Personal history of transient ischemic attack (TIA), and cerebral infarction without residual deficits: Secondary | ICD-10-CM

## 2023-09-18 DIAGNOSIS — I4891 Unspecified atrial fibrillation: Secondary | ICD-10-CM

## 2023-09-18 DIAGNOSIS — G40909 Epilepsy, unspecified, not intractable, without status epilepticus: Secondary | ICD-10-CM | POA: Diagnosis present

## 2023-09-18 DIAGNOSIS — I482 Chronic atrial fibrillation, unspecified: Secondary | ICD-10-CM | POA: Diagnosis present

## 2023-09-18 DIAGNOSIS — I5032 Chronic diastolic (congestive) heart failure: Secondary | ICD-10-CM | POA: Diagnosis present

## 2023-09-18 DIAGNOSIS — E870 Hyperosmolality and hypernatremia: Secondary | ICD-10-CM | POA: Diagnosis present

## 2023-09-18 DIAGNOSIS — N1831 Chronic kidney disease, stage 3a: Secondary | ICD-10-CM | POA: Diagnosis present

## 2023-09-18 DIAGNOSIS — E875 Hyperkalemia: Secondary | ICD-10-CM | POA: Diagnosis present

## 2023-09-18 DIAGNOSIS — I493 Ventricular premature depolarization: Secondary | ICD-10-CM | POA: Diagnosis present

## 2023-09-18 DIAGNOSIS — E785 Hyperlipidemia, unspecified: Secondary | ICD-10-CM | POA: Diagnosis present

## 2023-09-18 DIAGNOSIS — Z7951 Long term (current) use of inhaled steroids: Secondary | ICD-10-CM | POA: Diagnosis not present

## 2023-09-18 DIAGNOSIS — Z7901 Long term (current) use of anticoagulants: Secondary | ICD-10-CM

## 2023-09-18 DIAGNOSIS — D631 Anemia in chronic kidney disease: Secondary | ICD-10-CM | POA: Diagnosis present

## 2023-09-18 DIAGNOSIS — I2510 Atherosclerotic heart disease of native coronary artery without angina pectoris: Secondary | ICD-10-CM | POA: Diagnosis present

## 2023-09-18 DIAGNOSIS — K625 Hemorrhage of anus and rectum: Secondary | ICD-10-CM | POA: Diagnosis not present

## 2023-09-18 DIAGNOSIS — R0682 Tachypnea, not elsewhere classified: Secondary | ICD-10-CM | POA: Diagnosis present

## 2023-09-18 DIAGNOSIS — I10 Essential (primary) hypertension: Secondary | ICD-10-CM | POA: Diagnosis not present

## 2023-09-18 DIAGNOSIS — R569 Unspecified convulsions: Secondary | ICD-10-CM | POA: Diagnosis not present

## 2023-09-18 DIAGNOSIS — Z743 Need for continuous supervision: Secondary | ICD-10-CM | POA: Diagnosis not present

## 2023-09-18 DIAGNOSIS — D62 Acute posthemorrhagic anemia: Secondary | ICD-10-CM | POA: Diagnosis present

## 2023-09-18 DIAGNOSIS — R000 Tachycardia, unspecified: Secondary | ICD-10-CM | POA: Diagnosis present

## 2023-09-18 DIAGNOSIS — K922 Gastrointestinal hemorrhage, unspecified: Secondary | ICD-10-CM | POA: Diagnosis present

## 2023-09-18 DIAGNOSIS — L0291 Cutaneous abscess, unspecified: Principal | ICD-10-CM | POA: Diagnosis present

## 2023-09-18 LAB — ECHOCARDIOGRAM COMPLETE
Calc EF: 47.3 %
S' Lateral: 2.7 cm
Single Plane A2C EF: 40.8 %
Single Plane A4C EF: 55.7 %
Weight: 2747.81 [oz_av]

## 2023-09-18 LAB — GLUCOSE, CAPILLARY: Glucose-Capillary: 108 mg/dL — ABNORMAL HIGH (ref 70–99)

## 2023-09-18 LAB — LACTIC ACID, PLASMA: Lactic Acid, Venous: 1 mmol/L (ref 0.5–1.9)

## 2023-09-18 LAB — COMPREHENSIVE METABOLIC PANEL WITH GFR
ALT: 13 U/L (ref 0–44)
AST: 17 U/L (ref 15–41)
Albumin: <1.5 g/dL — ABNORMAL LOW (ref 3.5–5.0)
Alkaline Phosphatase: 40 U/L (ref 38–126)
Anion gap: 6 (ref 5–15)
BUN: 36 mg/dL — ABNORMAL HIGH (ref 8–23)
CO2: 28 mmol/L (ref 22–32)
Calcium: 7.9 mg/dL — ABNORMAL LOW (ref 8.9–10.3)
Chloride: 109 mmol/L (ref 98–111)
Creatinine, Ser: 1.39 mg/dL — ABNORMAL HIGH (ref 0.61–1.24)
GFR, Estimated: 56 mL/min — ABNORMAL LOW (ref >60)
Glucose, Bld: 117 mg/dL — ABNORMAL HIGH (ref 70–99)
Potassium: 4.1 mmol/L (ref 3.5–5.1)
Sodium: 143 mmol/L (ref 135–145)
Total Bilirubin: 1.1 mg/dL (ref 0.0–1.2)
Total Protein: 4.6 g/dL — ABNORMAL LOW (ref 6.5–8.1)

## 2023-09-18 LAB — CBC
HCT: 23.2 % — ABNORMAL LOW (ref 39.0–52.0)
HCT: 21.9 % — ABNORMAL LOW (ref 39.0–52.0)
Hemoglobin: 7.5 g/dL — ABNORMAL LOW (ref 13.0–17.0)
Hemoglobin: 7 g/dL — ABNORMAL LOW (ref 13.0–17.0)
MCH: 27.5 pg (ref 26.0–34.0)
MCH: 27 pg (ref 26.0–34.0)
MCHC: 32.3 g/dL (ref 30.0–36.0)
MCHC: 32 g/dL (ref 30.0–36.0)
MCV: 85 fL (ref 80.0–100.0)
MCV: 84.6 fL (ref 80.0–100.0)
Platelets: 144 K/uL — ABNORMAL LOW (ref 150–400)
Platelets: 169 K/uL (ref 150–400)
RBC: 2.73 MIL/uL — ABNORMAL LOW (ref 4.22–5.81)
RBC: 2.59 MIL/uL — ABNORMAL LOW (ref 4.22–5.81)
RDW: 18.2 % — ABNORMAL HIGH (ref 11.5–15.5)
RDW: 18.5 % — ABNORMAL HIGH (ref 11.5–15.5)
WBC: 14.2 K/uL — ABNORMAL HIGH (ref 4.0–10.5)
WBC: 13.6 K/uL — ABNORMAL HIGH (ref 4.0–10.5)
nRBC: 0 % (ref 0.0–0.2)
nRBC: 0 % (ref 0.0–0.2)

## 2023-09-18 LAB — BASIC METABOLIC PANEL WITH GFR
Anion gap: 4 — ABNORMAL LOW (ref 5–15)
BUN: 37 mg/dL — ABNORMAL HIGH (ref 8–23)
CO2: 29 mmol/L (ref 22–32)
Calcium: 8 mg/dL — ABNORMAL LOW (ref 8.9–10.3)
Chloride: 109 mmol/L (ref 98–111)
Creatinine, Ser: 1.41 mg/dL — ABNORMAL HIGH (ref 0.61–1.24)
GFR, Estimated: 55 mL/min — ABNORMAL LOW (ref >60)
Glucose, Bld: 116 mg/dL — ABNORMAL HIGH (ref 70–99)
Potassium: 4.1 mmol/L (ref 3.5–5.1)
Sodium: 142 mmol/L (ref 135–145)

## 2023-09-18 LAB — PHOSPHORUS: Phosphorus: 2.9 mg/dL (ref 2.5–4.6)

## 2023-09-18 LAB — MRSA NEXT GEN BY PCR, NASAL: MRSA by PCR Next Gen: NOT DETECTED

## 2023-09-18 LAB — APTT: aPTT: 40 s — ABNORMAL HIGH (ref 24–36)

## 2023-09-18 LAB — PROTIME-INR
INR: 1.8 — ABNORMAL HIGH (ref 0.8–1.2)
Prothrombin Time: 21.7 s — ABNORMAL HIGH (ref 11.4–15.2)

## 2023-09-18 LAB — MAGNESIUM: Magnesium: 1.6 mg/dL — ABNORMAL LOW (ref 1.7–2.4)

## 2023-09-18 MED ORDER — LEVETIRACETAM 500 MG PO TABS
1000.0000 mg | ORAL_TABLET | Freq: Two times a day (BID) | ORAL | Status: DC
  Administered 2023-09-18 – 2023-09-24 (×14): 1000 mg via ORAL
  Filled 2023-09-18 (×14): qty 2

## 2023-09-18 MED ORDER — PIPERACILLIN-TAZOBACTAM 3.375 G IVPB 30 MIN
3.3750 g | Freq: Once | INTRAVENOUS | Status: AC
  Administered 2023-09-18: 3.375 g via INTRAVENOUS
  Filled 2023-09-18 (×2): qty 50

## 2023-09-18 MED ORDER — CARMEX CLASSIC LIP BALM EX OINT
TOPICAL_OINTMENT | CUTANEOUS | Status: DC | PRN
  Filled 2023-09-18: qty 10

## 2023-09-18 MED ORDER — PANTOPRAZOLE SODIUM 40 MG PO TBEC
40.0000 mg | DELAYED_RELEASE_TABLET | Freq: Every day | ORAL | Status: DC
  Administered 2023-09-18 – 2023-09-24 (×7): 40 mg via ORAL
  Filled 2023-09-18 (×7): qty 1

## 2023-09-18 MED ORDER — CIPROFLOXACIN IN D5W 400 MG/200ML IV SOLN
400.0000 mg | Freq: Two times a day (BID) | INTRAVENOUS | Status: DC
  Administered 2023-09-18 – 2023-09-24 (×13): 400 mg via INTRAVENOUS
  Filled 2023-09-18 (×15): qty 200

## 2023-09-18 MED ORDER — MAGNESIUM SULFATE 2 GM/50ML IV SOLN
2.0000 g | Freq: Once | INTRAVENOUS | Status: AC
  Administered 2023-09-18: 2 g via INTRAVENOUS
  Filled 2023-09-18: qty 50

## 2023-09-18 MED ORDER — DOCUSATE SODIUM 100 MG PO CAPS
100.0000 mg | ORAL_CAPSULE | Freq: Two times a day (BID) | ORAL | Status: DC | PRN
  Administered 2023-09-23: 100 mg via ORAL
  Filled 2023-09-18: qty 1

## 2023-09-18 MED ORDER — CHLORHEXIDINE GLUCONATE CLOTH 2 % EX PADS
6.0000 | MEDICATED_PAD | Freq: Every day | CUTANEOUS | Status: DC
  Administered 2023-09-18 – 2023-09-24 (×7): 6 via TOPICAL

## 2023-09-18 MED ORDER — DEXTROSE 50 % IV SOLN: 25.0000 g | Freq: Once | INTRAVENOUS | Status: DC

## 2023-09-18 MED ORDER — POLYETHYLENE GLYCOL 3350 17 G PO PACK
17.0000 g | PACK | Freq: Every day | ORAL | Status: DC | PRN
  Administered 2023-09-23: 17 g via ORAL
  Filled 2023-09-18: qty 1

## 2023-09-18 MED ORDER — ADULT MULTIVITAMIN W/MINERALS CH
1.0000 | ORAL_TABLET | Freq: Every day | ORAL | Status: DC
  Administered 2023-09-18 – 2023-09-24 (×7): 1 via ORAL
  Filled 2023-09-18 (×7): qty 1

## 2023-09-18 MED ORDER — DIVALPROEX SODIUM 250 MG PO DR TAB
250.0000 mg | DELAYED_RELEASE_TABLET | Freq: Three times a day (TID) | ORAL | Status: DC
  Administered 2023-09-18 – 2023-09-19 (×4): 250 mg via ORAL
  Filled 2023-09-18 (×6): qty 1

## 2023-09-18 MED ORDER — METRONIDAZOLE 500 MG/100ML IV SOLN
500.0000 mg | Freq: Two times a day (BID) | INTRAVENOUS | Status: DC
  Administered 2023-09-18 – 2023-09-24 (×13): 500 mg via INTRAVENOUS
  Filled 2023-09-18 (×13): qty 100

## 2023-09-18 MED ORDER — PIPERACILLIN-TAZOBACTAM 3.375 G IVPB: 3.3750 g | Freq: Three times a day (TID) | INTRAVENOUS | Status: DC

## 2023-09-18 MED ORDER — DONEPEZIL HCL 5 MG PO TABS
5.0000 mg | ORAL_TABLET | Freq: Every day | ORAL | Status: DC
  Administered 2023-09-18 – 2023-09-23 (×6): 5 mg via ORAL
  Filled 2023-09-18 (×7): qty 1

## 2023-09-18 MED ORDER — CALCIUM CARBONATE ANTACID 500 MG PO CHEW: 1.0000 | CHEWABLE_TABLET | Freq: Two times a day (BID) | ORAL | Status: DC | PRN

## 2023-09-18 MED ORDER — ENSURE PLUS HIGH PROTEIN PO LIQD
237.0000 mL | Freq: Three times a day (TID) | ORAL | Status: DC
  Administered 2023-09-18 – 2023-09-24 (×15): 237 mL via ORAL

## 2023-09-18 NOTE — H&P (Signed)
 NAME:  Nathan Shepherd, MRN:  [id number], DOB:  1955/12/10, LOS: 0 ADMISSION DATE:  09/18/2023, CONSULTATION DATE:  09/18/23 REFERRING MD:  UNK rouse, CHIEF COMPLAINT:  rectal bleeding   History of Present Illness:  Nathan Shepherd is a 68 y.o. M with PMH significant for HFpEF, CKD stage 3, HL, HTN, atrial fibrillation on warfarin, CVA and prior polysubstance abuse who presented to [facility] [place] after a significant amount of blood was found in his diaper at his assisted living facility.  He had recently experienced significant bleeding from a rectal hemorrhoid and was admitted for a hemorrhoidectomy on 9/2.  While at [facility] his Hgb dropped from 9.6 to 6.7 and he was transfused 2.5 units PRBC's, given vitamin K  and Kcentra and IVF.  He was hypotensive in the ED, therefore ICU admission requested, though BP improved with IVF and transfusion and he did not require pressor support. CTA showed Active GI bleeding in the rectum and extensive inflammatory change surrounding the rectum with rim-enhancing fluid collection involving the right aspect of the prostate that may reflect abscess   Pt currently denies any abdominal or rectal discomfort.  Pertinent  Medical History   has a past medical history of CHF (congestive heart failure) (HCC), CKD (chronic kidney disease) stage 3, GFR 30-59 ml/min (HCC), Edema, Hyperlipemia, Hypertension, and Seizures (HCC).   Significant Hospital Events: Including procedures, antibiotic start and stop dates in addition to other pertinent events   9/15 transfer from [facility], no further bleeding  Interim History / Subjective:  Arrived hemodynamically stable   Objective    There were no vitals taken for this visit.       No intake or output data in the 24 hours ending 09/18/23 0059 There were no vitals filed for this visit.  General:  thin, chronically ill-appearing M resting in bed in NAD HEENT: MM pink/moist, poor dentition  Neuro: alert and oriented to  person, slightly confused to situation, moving all extremities to command CV: s1s2 rrr, no m/r/g PULM:  clear bilaterally  GI: soft, bsx4 active  Extremities: warm/dry, no edema     Resolved problem list   Assessment and Plan   ABLA secondary to LGIB Possible peri-prostatic abscess and proctitis Active bleed on CTA in the rectum, no further episodes of bleeding since transferring -repeat labs are pending after 2.5u PRBC's -trend h/h, type and screen, check coags, received Kcentra and vit K -Surgery and urology consults in the AM, IR consult to review CTA in the event pt re-bleeds -lactic acid -NPO   CKD III -creatinine near baseline at 1.47 -monitor renal indices, electrolytes and UOP and avoid nephrotoxins    History of seizures -continue Keppra  and depakote    History of Afib HFpEF -sinus rhythm, hold coumadin  in the setting of bleeding, hold coreg  and torsemide  due to borderline low BP   History of polysubstance abuse and possible dementia -no recent substance  use per patient -continue aricept    Labs   CBC: Recent Labs  Lab 09/11/23 0442 09/12/23 0507  WBC 11.2* 7.5  HGB 10.2* 11.2*  HCT 32.2* 36.0*  MCV 89.7 89.6  PLT 219 197    Basic Metabolic Panel: Recent Labs  Lab 09/11/23 0442 09/12/23 0507 09/13/23 0442  NA 139 139 141  K 4.3 4.5 5.3*  CL 104 101 104  CO2 31 29 32  GLUCOSE 109* 115* 105*  BUN 28* 34* 40*  CREATININE 1.37* 1.39* 1.44*  CALCIUM  8.9 9.3 9.8  MG 1.8 2.3 2.3  GFR: Estimated Creatinine Clearance: 57.1 mL/min (A) (by C-G formula based on SCr of 1.44 mg/dL (H)). Recent Labs  Lab 09/11/23 0442 09/12/23 0507  WBC 11.2* 7.5    Liver Function Tests: Recent Labs  Lab 09/12/23 0507  AST 24  ALT 13  ALKPHOS 70  BILITOT 0.7  PROT 6.4*  ALBUMIN 2.2*   No results for input(s): LIPASE, AMYLASE in the last 168 hours. Recent Labs  Lab 09/11/23 1803 09/12/23 0507  AMMONIA 25 24    ABG    Component Value  Date/Time   PHART 7.47 (H) 05/12/2022 1130   PCO2ART 43 05/12/2022 1130   PO2ART 165 (H) 05/12/2022 1130   HCO3 31.3 (H) 05/12/2022 1130   TCO2 22 08/21/2021 1821   ACIDBASEDEF 1.0 08/21/2021 1821   O2SAT 99.2 05/12/2022 1130     Coagulation Profile: Recent Labs  Lab 09/11/23 0442 09/12/23 0507 09/13/23 0442 09/14/23 0522 09/15/23 0552  INR 3.0* 2.3* 2.3* 2.8* 2.0*    Cardiac Enzymes: No results for input(s): CKTOTAL, CKMB, CKMBINDEX, TROPONINI in the last 168 hours.  HbA1C: Hgb A1c MFr Bld  Date/Time Value Ref Range Status  05/12/2022 03:09 PM 6.1 (H) 4.8 - 5.6 % Final    Comment:    (NOTE) Pre diabetes:          5.7%-6.4%  Diabetes:              >6.4%  Glycemic control for   <7.0% adults with diabetes   08/18/2016 02:35 AM 6.4 (H) 4.8 - 5.6 % Final    Comment:    (NOTE) Pre diabetes:          5.7%-6.4% Diabetes:              >6.4% Glycemic control for   <7.0% adults with diabetes     CBG: Recent Labs  Lab 09/18/23 0057  GLUCAP 108*    Review of Systems:   Please see the history of present illness. All other systems reviewed and are negative    Past Medical History:  He,  has a past medical history of CHF (congestive heart failure) (HCC), CKD (chronic kidney disease) stage 3, GFR 30-59 ml/min (HCC), Edema, Hyperlipemia, Hypertension, and Seizures (HCC).   Surgical History:   Past Surgical History:  Procedure Laterality Date   COLONOSCOPY N/A 03/24/2023   Procedure: COLONOSCOPY;  Surgeon: Cinderella [name], MD;  Location: [facility];  Service: Endoscopy;  Laterality: N/A;   ESOPHAGOGASTRODUODENOSCOPY N/A 03/24/2023   Procedure: EGD (ESOPHAGOGASTRODUODENOSCOPY);  Surgeon: Cinderella [name], MD;  Location: [facility];  Service: Endoscopy;  Laterality: N/A;   HEMORRHOID SURGERY N/A 09/05/2023   Procedure: EXTENSIVE HEMORRHOIDECTOMY;  Surgeon: [name] Manuelita BROCKS, MD;  Location: [facility] ORS;  Service: General;  Laterality: N/A;   NO PAST  SURGERIES       Social History:   reports that he has never smoked. He has never used smokeless tobacco. He reports that he does not currently use alcohol. He reports that he does not use drugs.   Family History:  His family history is negative for Colon cancer and Liver disease.   Allergies Allergies  Allergen Reactions   Penicillins Hives    Tolerated Zosyn      Home Medications  Prior to Admission medications   Medication Sig Start Date End Date Taking? Authorizing Provider  acetaminophen  (TYLENOL ) 325 MG tablet Take 650 mg by mouth every 4 (four) hours as needed for mild pain (pain score 1-3).  [provider]  Calcium  Carbonate Antacid (ANTACID PO) Take 30 mLs by mouth 4 (four) times daily as needed (heartburn, indigestion). Antacid Suspension    [provider]  carvedilol  (COREG ) 3.125 MG tablet Take 1 tablet (3.125 mg total) by mouth 2 (two) times daily. 09/07/23 09/06/24  Pearlean Manus, MD  diphenhydrAMINE  (BENADRYL ) 25 MG tablet Take 25 mg by mouth every 6 (six) hours as needed for allergies or itching.    [provider]  divalproex  (DEPAKOTE ) 250 MG DR tablet Take 3 tablets (750 mg total) by mouth every 12 (twelve) hours. 10/24/22 09/11/23  Jhonny Calvin NOVAK, MD  donepezil  (ARICEPT ) 5 MG tablet Take 5 mg by mouth at bedtime.    [provider]  Emollient (EUCERIN) lotion Apply 1 Application topically daily. (Apply to legs and feet)    [provider]  feeding supplement (ENSURE ENLIVE / ENSURE PLUS) LIQD Take 237 mLs by mouth 2 (two) times daily between meals. 03/25/23   Maree, Pratik D, DO  fluocinolone 0.01 % cream Apply 1 application  topically 3 (three) times daily as needed (bug bites/rash).    [provider]  levETIRAcetam  (KEPPRA ) 1000 MG tablet Take 1,000 mg by mouth 2 (two) times daily.    [provider]  loperamide  (IMODIUM ) 2 MG capsule Take 1 capsule (2 mg total) by mouth as needed for diarrhea or  loose stools. 05/17/22   Danton Reyes DASEN, MD  omeprazole  (PRILOSEC) 20 MG capsule Take 1 capsule (20 mg total) by mouth 2 (two) times daily before a meal. Take 30 min before breakfast and 30 min before dinner 09/07/23   Pearlean Manus, MD  ondansetron  (ZOFRAN ) 4 MG tablet Take 1 tablet (4 mg total) by mouth every 6 (six) hours as needed for nausea. 09/07/23   Pearlean Manus, MD  polyethylene glycol (MIRALAX ) 17 g packet Take 17 g by mouth every Monday, Wednesday, and Friday. 09/08/23   Pearlean Manus, MD  rosuvastatin  (CRESTOR ) 20 MG tablet Take 20 mg by mouth at bedtime.     [provider]  senna-docusate (SENOKOT-S) 8.6-50 MG tablet Take 2 tablets by mouth at bedtime. 09/07/23 09/06/24  Pearlean Manus, MD  SYMBICORT  160-4.5 MCG/ACT inhaler Inhale 2 puffs into the lungs in the morning and at bedtime. 09/07/23   Pearlean Manus, MD  torsemide  (DEMADEX ) 20 MG tablet Take 20 mg by mouth every morning.    [provider]  Vitamin D , Ergocalciferol , (DRISDOL ) 1.25 MG (50000 UNIT) CAPS capsule Take 50,000 Units by mouth every Monday.    [provider]  warfarin (COUMADIN ) 5 MG tablet Take 1 tablet (5 mg total) by mouth daily. 09/14/23   Bryn Bernardino NOVAK, MD     Critical care time:        Leita SAUNDERS Florencia Zaccaro, PA-C [facility] See Amion for pager If no response to pager , please call [phone] until 7pm After 7:00 pm call Elink  [phone]

## 2023-09-18 NOTE — Plan of Care (Signed)
  Problem: Clinical Measurements: Goal: Ability to maintain clinical measurements within normal limits will improve Outcome: Progressing Goal: Will remain free from infection Outcome: Progressing Goal: Diagnostic test results will improve Outcome: Progressing Goal: Cardiovascular complication will be avoided Outcome: Progressing   Problem: Nutrition: Goal: Adequate nutrition will be maintained Outcome: Progressing   Problem: Safety: Goal: Ability to remain free from injury will improve Outcome: Progressing   Problem: Skin Integrity: Goal: Risk for impaired skin integrity will decrease Outcome: Progressing

## 2023-09-18 NOTE — Progress Notes (Addendum)
 eLink Physician-Brief Progress Note Patient Name: Jabar Krysiak DOB: 1955/10/18 MRN: [id number]   Date of Service  09/18/2023  HPI/Events of Note  68 year old male with a history of a prostate abscess, seizure disorder, and metabolic syndrome with CAD and CKD who was recently admitted for rectal bleeding with postoperative bleeding from hemorrhoidectomy as well who presented with acute blood loss and a prostate abscess.  His coagulopathy was reversed and he was transferred to the hospital for management of hemorrhagic shock.  Vital signs consistent with tachypnea, tachycardia, and hypotension.  Saturating 98% on room air.  Results show stable metabolic panel, 4 g drop in hemoglobin, leukocytosis, and outside imaging consistent with prostate abscess.  eICU Interventions  Add ciprofloxacin  for better prostatic penetration.  Maintain Zosyn .  Maintain AEDs  Transfuse for hemoglobin less than 7  Vasopressors as needed to maintain MAP greater than 65  DVT prophylaxis with SCDs, consider heparin  GI prophylaxis not indicated, home PPI   0349 -  incontinent and ordered strict I&O,add male purewick. D/w pharmacy - switch to cipro +flagyl  without zosyn   Intervention Category Evaluation Type: New Patient Evaluation  Shawnte Demarest 09/18/2023, 1:28 AM
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
## 2023-09-18 NOTE — Progress Notes (Signed)
 NAME:  Nathan Shepherd, MRN:  [id number], DOB:  11/25/1955, LOS: 0 ADMISSION DATE:  09/18/2023, CONSULTATION DATE:  09/18/23 REFERRING MD:  UNK rouse, CHIEF COMPLAINT:  rectal bleeding   History of Present Illness:  [name] is a 68 y.o. M with PMH significant for HFpEF, CKD stage 3, HL, HTN, atrial fibrillation on warfarin, CVA and prior polysubstance abuse who presented to [facility] [place] after a significant amount of blood was found in his diaper at his assisted living facility.  He had recently experienced significant bleeding from a rectal hemorrhoid and was admitted for a hemorrhoidectomy on 9/2.  While at [facility] his Hgb dropped from 9.6 to 6.7 and he was transfused 2.5 units PRBC's, given vitamin K  and Kcentra and IVF.  He was hypotensive in the ED, therefore ICU admission requested, though BP improved with IVF and transfusion and he did not require pressor support. CTA showed Active GI bleeding in the rectum and extensive inflammatory change surrounding the rectum with rim-enhancing fluid collection involving the right aspect of the prostate that may reflect abscess   Pt currently denies any abdominal or rectal discomfort.  Pertinent  Medical History   has a past medical history of CHF (congestive heart failure) (HCC), CKD (chronic kidney disease) stage 3, GFR 30-59 ml/min (HCC), Edema, Hyperlipemia, Hypertension, and Seizures (HCC).   Significant Hospital Events: Including procedures, antibiotic start and stop dates in addition to other pertinent events   9/15 transfer from [facility], no further bleeding  Interim History / Subjective:  No further bleeding overnight. Desires diet. Not on vasopressors.   Objective    Blood pressure 90/65, pulse (!) 149, temperature 98.1 F (36.7 C), temperature source Oral, resp. rate (!) 29, weight 77.9 kg, SpO2 97%.        Intake/Output Summary (Last 24 hours) at 09/18/2023 1028 Last data filed at 09/18/2023 0900 Gross per 24 hour   Intake 149.97 ml  Output 400 ml  Net -250.03 ml   Filed Weights   09/18/23 0500  Weight: 77.9 kg    Gen:      Intubated, sedated, acutely ill appearing HEENT:  poor dentition Lungs:   breathing non labored, ctab CV:         RRR Abd:      Soft, nontender Ext:    No edema Skin:      Warm and dry; no rashes Neuro:   awake, follows commands, waxing waning GU: no obvious rectal bleeding s/p hemorrhoidectomy     Resolved problem list   Assessment and Plan   ABLA secondary to LGIB Likely in the setting of supratherapeutic INR 5.76 ---->1.8 with gershon Possible peri-prostatic abscess and proctitis - general surgery consulted, likely nothing to do if hemorrhoidal bleeding is self limited. Consider prn anusol suppositories.  - consult to IR if rebleeds - Hemoglobin 11 --->6.7 ---> after blood7.2 here.  - will treat for 14 days for proctitis  -NPO for now.    CKD III -creatinine 1.39 at baseline -monitor renal indices, electrolytes and UOP and avoid nephrotoxins    History of seizures -continue Keppra  and depakote    History of Afib HFpEF -sinus rhythm, hold coumadin  in the setting of bleeding, hold coreg  and torsemide  due to borderline low BP   History of polysubstance abuse and possible dementia -no recent substance  use per patient -continue aricept    I spent 35 minutes in total visit time for this patient, with more than 50% spent counseling/coordinating care.  [name], MD Pulmonary  and Critical Care Medicine [facility] 09/18/2023 10:38 AM Pager: see AMION  If no response to pager, please call critical care on call (see AMION) until 7pm After 7:00 pm call Elink     Labs   CBC: Recent Labs  Lab 09/12/23 0507 09/18/23 0140  WBC 7.5 14.2*  HGB 11.2* 7.5*  HCT 36.0* 23.2*  MCV 89.6 85.0  PLT 197 144*    Basic Metabolic Panel: Recent Labs  Lab 09/12/23 0507 09/13/23 0442 09/18/23 0140  NA 139 141 143  142  K 4.5 5.3* 4.1  4.1   CL 101 104 109  109  CO2 29 32 28  29  GLUCOSE 115* 105* 117*  116*  BUN 34* 40* 36*  37*  CREATININE 1.39* 1.44* 1.39*  1.41*  CALCIUM  9.3 9.8 7.9*  8.0*  MG 2.3 2.3 1.6*  PHOS  --   --  2.9   GFR: Estimated Creatinine Clearance: 57.6 mL/min (A) (by C-G formula based on SCr of 1.39 mg/dL (H)). Recent Labs  Lab 09/12/23 0507 09/18/23 0140  WBC 7.5 14.2*  LATICACIDVEN  --  1.0    Liver Function Tests: Recent Labs  Lab 09/12/23 0507 09/18/23 0140  AST 24 17  ALT 13 13  ALKPHOS 70 40  BILITOT 0.7 1.1  PROT 6.4* 4.6*  ALBUMIN 2.2* <1.5*   No results for input(s): LIPASE, AMYLASE in the last 168 hours. Recent Labs  Lab 09/11/23 1803 09/12/23 0507  AMMONIA 25 24    ABG    Component Value Date/Time   PHART 7.47 (H) 05/12/2022 1130   PCO2ART 43 05/12/2022 1130   PO2ART 165 (H) 05/12/2022 1130   HCO3 31.3 (H) 05/12/2022 1130   TCO2 22 08/21/2021 1821   ACIDBASEDEF 1.0 08/21/2021 1821   O2SAT 99.2 05/12/2022 1130     Coagulation Profile: Recent Labs  Lab 09/12/23 0507 09/13/23 0442 09/14/23 0522 09/15/23 0552 09/18/23 0140  INR 2.3* 2.3* 2.8* 2.0* 1.8*    Cardiac Enzymes: No results for input(s): CKTOTAL, CKMB, CKMBINDEX, TROPONINI in the last 168 hours.  HbA1C: Hgb A1c MFr Bld  Date/Time Value Ref Range Status  05/12/2022 03:09 PM 6.1 (H) 4.8 - 5.6 % Final    Comment:    (NOTE) Pre diabetes:          5.7%-6.4%  Diabetes:              >6.4%  Glycemic control for   <7.0% adults with diabetes   08/18/2016 02:35 AM 6.4 (H) 4.8 - 5.6 % Final    Comment:    (NOTE) Pre diabetes:          5.7%-6.4% Diabetes:              >6.4% Glycemic control for   <7.0% adults with diabetes     CBG: Recent Labs  Lab 09/18/23 0057  GLUCAP 108*

## 2023-09-18 NOTE — Progress Notes (Signed)
 Initial Nutrition Assessment  DOCUMENTATION CODES:  Severe malnutrition in context of chronic illness  INTERVENTION:  When able, recommend advancing to most liberal diet possible to allow for maximum food selection Ensure Plus High Protein po TID, each supplement provides 350 kcal and 20 grams of protein Magic cup TID with meals, each supplement provides 290 kcal and 9 grams of protein MVI with minerals daily  NUTRITION DIAGNOSIS:  Severe Malnutrition related to chronic illness (CHF) as evidenced by severe muscle depletion, severe fat depletion.  GOAL:  Patient will meet greater than or equal to 90% of their needs  MONITOR:  Diet advancement, Supplement acceptance, Labs  REASON FOR ASSESSMENT:  Consult Assessment of nutrition requirement/status  ASSESSMENT:  Pt with hx of CHF, CKD3, HTN, HLD, seizure disorder, atrial fibrillation, prior CVA, and hx of polysubstance abuse presented to [facility] as a transfer from [facility] for rectal bleeding at his SNF.  Recent Admissions: [facility] (9/1-9/3 and 9/5-9/12), [facility] (ED 9/14)  Pt resting in bed at the time of assessment sleepy and doesn't wake much to talk but will answer a few questions. Currently NPO and awaiting surgery consult. RN hopeful to be able to advance diet this afternoon as she said pt has been asking for ensure this AM. Prefers vanilla.   Chart shows severe wt loss over the last 6 months and on exam, severe muscle and fat deficits present. Inquired about wt loss, pt states that he has been told he's lost weight but he hasn't noticed. Has been in and out of the hospital several times this month.   Will monitor for diet advancement and add nutrition supplements when able.   Admit / Current weight: 77.9 kg  11.3% weight loss noted in the last 6 months (3/21-9/15), severe for timeframe   Intake/Output Summary (Last 24 hours) at 09/18/2023 1202 Last data filed at 09/18/2023 0900 Gross per 24 hour  Intake  149.97 ml  Output 400 ml  Net -250.03 ml  Net IO Since Admission: -250.03 mL [09/18/23 1202]  Drains/Lines: CVC triple lumen, right IJ UOP so far this AM  Nutritionally Relevant Medications: Scheduled Meds:  donepezil   5 mg Oral QHS   pantoprazole   40 mg Oral Daily   Continuous Infusions:  ciprofloxacin  400 mg (09/18/23 0910)   metronidazole  500 mg (09/18/23 0911)   PRN Meds: calcium  carbonate, docusate sodium , polyethylene glycol  Labs Reviewed: BUN 36, creatinine 1.39 Magnesium  1.6 CBG ranges from 108-117 mg/dL over the last 24 hours HgbA1c 6.1% (5/9)  NUTRITION - FOCUSED PHYSICAL EXAM: Flowsheet Row Most Recent Value  Orbital Region Severe depletion  Upper Arm Region Severe depletion  Thoracic and Lumbar Region Moderate depletion  Buccal Region Severe depletion  Temple Region Severe depletion  Clavicle Bone Region Severe depletion  Clavicle and Acromion Bone Region Severe depletion  Scapular Bone Region Severe depletion  Dorsal Hand Moderate depletion  Patellar Region Severe depletion  Anterior Thigh Region Severe depletion  Posterior Calf Region Severe depletion  Edema (RD Assessment) None  Hair Reviewed  Eyes Reviewed  Mouth Reviewed  Skin Reviewed  Nails Reviewed    Diet Order:   Diet Order             Diet NPO time specified  Diet effective now                   EDUCATION NEEDS:  Education needs have been addressed  Skin:  Skin Assessment: Reviewed RN Assessment  Last BM:  9/15 - bloody stool  Height:  Ht Readings from Last 1 Encounters:  09/08/23 6' 4 (1.93 m)    Weight:  Wt Readings from Last 1 Encounters:  09/18/23 77.9 kg    Ideal Body Weight:  91.8 kg  BMI:  Body mass index is 20.9 kg/m.  Estimated Nutritional Needs:  Kcal:  2200-2400 kcal/d Protein:  110-125 g/d Fluid:  2.2-2.4L/d    Vernell Lukes, RD, LDN, CNSC Registered Dietitian II Please reach out via secure chat

## 2023-09-18 NOTE — Consult Note (Signed)
 Consult Note  Nathan Shepherd 03/08/1955  [id number].    Requesting MD: [name], MD Chief Complaint/Reason for Consult: BRBPR s/p hemorrhoidectomy 2 weeks ago   HPI:  Patient is a 68 year old male who presented to [facility] with BRBPR s/p hemorrhoidectomy 09/05/23 by Dr. [name] at [facility] 2 weeks ago. Patient was sent to ED from ALF with large amount of blood in diaper. Hgb dropped at [facility] from 9.6 to 6.7 and he was given 2.5 units PRBCs, and given vitamin K  and KCentra for supratherapeutic INR. CTA there showed active bleeding from the rectum and he was transferred to [facility] for IR evaluation for embolization. He is on warfarin for Atrial fibrillation. He is now hemodynamically improving and has not had any further BRBPR. Patient denies rectal pain or abdominal pain and would like something to eat. IR evaluated and did not recommend any acute intervention since patient seems to have improved with supportive management.   ROS: Negative other than HPI.   Family History  Problem Relation Age of Onset   Colon cancer Neg Hx    Liver disease Neg Hx     Past Medical History:  Diagnosis Date   CHF (congestive heart failure) (HCC)    CKD (chronic kidney disease) stage 3, GFR 30-59 ml/min (HCC)    Edema    Hyperlipemia    Hypertension    Seizures (HCC)     Past Surgical History:  Procedure Laterality Date   COLONOSCOPY N/A 03/24/2023   Procedure: COLONOSCOPY;  Surgeon: Cinderella [name], MD;  Location: [facility];  Service: Endoscopy;  Laterality: N/A;   ESOPHAGOGASTRODUODENOSCOPY N/A 03/24/2023   Procedure: EGD (ESOPHAGOGASTRODUODENOSCOPY);  Surgeon: Cinderella [name], MD;  Location: [facility];  Service: Endoscopy;  Laterality: N/A;   HEMORRHOID SURGERY N/A 09/05/2023   Procedure: EXTENSIVE HEMORRHOIDECTOMY;  Surgeon: [name] Manuelita BROCKS, MD;  Location: [facility] ORS;  Service: General;  Laterality: N/A;   NO PAST SURGERIES      Social History:  reports that he has  never smoked. He has never used smokeless tobacco. He reports that he does not currently use alcohol. He reports that he does not use drugs.  Allergies:  Allergies  Allergen Reactions   Penicillins Hives    Tolerated Zosyn     Medications Prior to Admission  Medication Sig Dispense Refill   acetaminophen  (TYLENOL ) 325 MG tablet Take 650 mg by mouth every 4 (four) hours as needed for mild pain (pain score 1-3).     Calcium  Carbonate Antacid (ANTACID PO) Take 30 mLs by mouth 4 (four) times daily as needed (heartburn, indigestion). Antacid Suspension     carvedilol  (COREG ) 3.125 MG tablet Take 1 tablet (3.125 mg total) by mouth 2 (two) times daily. 60 tablet 11   diphenhydrAMINE  (BENADRYL ) 25 MG tablet Take 25 mg by mouth every 6 (six) hours as needed for allergies or itching.     divalproex  (DEPAKOTE ) 250 MG DR tablet Take 3 tablets (750 mg total) by mouth every 12 (twelve) hours. 180 tablet 0   donepezil  (ARICEPT ) 5 MG tablet Take 5 mg by mouth at bedtime.     Emollient (EUCERIN) lotion Apply 1 Application topically daily. (Apply to legs and feet)     feeding supplement (ENSURE ENLIVE / ENSURE PLUS) LIQD Take 237 mLs by mouth 2 (two) times daily between meals. 237 mL 12   fluocinolone 0.01 % cream Apply 1 application  topically 3 (three) times daily as needed (bug  bites/rash).     levETIRAcetam  (KEPPRA ) 1000 MG tablet Take 1,000 mg by mouth 2 (two) times daily.     loperamide  (IMODIUM ) 2 MG capsule Take 1 capsule (2 mg total) by mouth as needed for diarrhea or loose stools. 30 capsule 0   omeprazole  (PRILOSEC) 20 MG capsule Take 1 capsule (20 mg total) by mouth 2 (two) times daily before a meal. Take 30 min before breakfast and 30 min before dinner 60 capsule 3   ondansetron  (ZOFRAN ) 4 MG tablet Take 1 tablet (4 mg total) by mouth every 6 (six) hours as needed for nausea. 20 tablet 0   polyethylene glycol (MIRALAX ) 17 g packet Take 17 g by mouth every Monday, Wednesday, and Friday. 14 each 5    rosuvastatin  (CRESTOR ) 20 MG tablet Take 20 mg by mouth at bedtime.      senna-docusate (SENOKOT-S) 8.6-50 MG tablet Take 2 tablets by mouth at bedtime. 60 tablet 1   SYMBICORT  160-4.5 MCG/ACT inhaler Inhale 2 puffs into the lungs in the morning and at bedtime. 30.6 each 5   torsemide  (DEMADEX ) 20 MG tablet Take 20 mg by mouth every morning.     Vitamin D , Ergocalciferol , (DRISDOL ) 1.25 MG (50000 UNIT) CAPS capsule Take 50,000 Units by mouth every Monday.     warfarin (COUMADIN ) 5 MG tablet Take 1 tablet (5 mg total) by mouth daily.      Blood pressure 92/74, pulse 90, temperature 98.2 F (36.8 C), temperature source Axillary, resp. rate 13, weight 77.9 kg, SpO2 100%. Physical Exam:  General: pleasant, WD, WN male who is laying in bed in NAD HEENT: head is normocephalic, atraumatic.  Sclera are noninjected.   Ears and nose without any masses or lesions.  Mouth is pink and moist Heart: regular, rate, and rhythm.  Normal s1,s2. No obvious murmurs, gallops, or rubs noted.  Palpable radial and pedal pulses bilaterally Lungs: No wheezes, rhonchi, or rales noted.  Respiratory effort nonlabored Abd: soft, NT, ND, no masses, hernias, or organomegaly MS: all 4 extremities are symmetrical with no cyanosis, clubbing, or edema. Skin: warm and dry with no masses, lesions, or rashes    Results for orders placed or performed during the hospital encounter of 09/18/23 (from the past 48 hours)  Glucose, capillary     Status: Abnormal   Collection Time: 09/18/23 12:57 AM  Result Value Ref Range   Glucose-Capillary 108 (H) 70 - 99 mg/dL    Comment: Glucose reference range applies only to samples taken after fasting for at least 8 hours.  Comprehensive metabolic panel     Status: Abnormal   Collection Time: 09/18/23  1:40 AM  Result Value Ref Range   Sodium 143 135 - 145 mmol/L   Potassium 4.1 3.5 - 5.1 mmol/L   Chloride 109 98 - 111 mmol/L   CO2 28 22 - 32 mmol/L   Glucose, Bld 117 (H) 70 - 99 mg/dL     Comment: Glucose reference range applies only to samples taken after fasting for at least 8 hours.   BUN 36 (H) 8 - 23 mg/dL   Creatinine, Ser 8.60 (H) 0.61 - 1.24 mg/dL   Calcium  7.9 (L) 8.9 - 10.3 mg/dL   Total Protein 4.6 (L) 6.5 - 8.1 g/dL   Albumin <8.4 (L) 3.5 - 5.0 g/dL    Comment: REPEATED TO VERIFY   AST 17 15 - 41 U/L   ALT 13 0 - 44 U/L   Alkaline Phosphatase 40 38 - 126 U/L  Total Bilirubin 1.1 0.0 - 1.2 mg/dL   GFR, Estimated 56 (L) >60 mL/min    Comment: (NOTE) Calculated using the CKD-EPI Creatinine Equation (2021)    Anion gap 6 5 - 15    Comment: Performed at [facility], [address], [place], [place] [address]  Magnesium      Status: Abnormal   Collection Time: 09/18/23  1:40 AM  Result Value Ref Range   Magnesium  1.6 (L) 1.7 - 2.4 mg/dL    Comment: Performed at [facility], [address], [place], [place] [address]  Phosphorus     Status: None   Collection Time: 09/18/23  1:40 AM  Result Value Ref Range   Phosphorus 2.9 2.5 - 4.6 mg/dL    Comment: Performed at [facility], [address], [place], [place] [address]  Lactic acid, plasma     Status: None   Collection Time: 09/18/23  1:40 AM  Result Value Ref Range   Lactic Acid, Venous 1.0 0.5 - 1.9 mmol/L    Comment: Performed at [facility], [address], [place], [place] [address]  CBC     Status: Abnormal   Collection Time: 09/18/23  1:40 AM  Result Value Ref Range   WBC 14.2 (H) 4.0 - 10.5 K/uL   RBC 2.73 (L) 4.22 - 5.81 MIL/uL   Hemoglobin 7.5 (L) 13.0 - 17.0 g/dL   HCT 76.7 (L) 60.9 - 47.9 %   MCV 85.0 80.0 - 100.0 fL   MCH 27.5 26.0 - 34.0 pg   MCHC 32.3 30.0 - 36.0 g/dL   RDW 81.7 (H) 88.4 - 84.4 %   Platelets 144 (L) 150 - 400 K/uL   nRBC 0.0 0.0 - 0.2 %    Comment: Performed at [facility], [address] [place], [place] [address]  Basic metabolic panel     Status: Abnormal   Collection Time: 09/18/23  1:40 AM  Result Value Ref Range   Sodium 142  135 - 145 mmol/L   Potassium 4.1 3.5 - 5.1 mmol/L   Chloride 109 98 - 111 mmol/L   CO2 29 22 - 32 mmol/L   Glucose, Bld 116 (H) 70 - 99 mg/dL    Comment: Glucose reference range applies only to samples taken after fasting for at least 8 hours.   BUN 37 (H) 8 - 23 mg/dL   Creatinine, Ser 8.58 (H) 0.61 - 1.24 mg/dL   Calcium  8.0 (L) 8.9 - 10.3 mg/dL   GFR, Estimated 55 (L) >60 mL/min    Comment: (NOTE) Calculated using the CKD-EPI Creatinine Equation (2021)    Anion gap 4 (L) 5 - 15    Comment: Performed at [facility], [address] [place], [place] [address]  Protime-INR     Status: Abnormal   Collection Time: 09/18/23  1:40 AM  Result Value Ref Range   Prothrombin Time 21.7 (H) 11.4 - 15.2 seconds   INR 1.8 (H) 0.8 - 1.2    Comment: (NOTE) INR goal varies based on device and disease states. Performed at [facility], [address], [place], [place] [address]   APTT     Status: Abnormal   Collection Time: 09/18/23  1:40 AM  Result Value Ref Range   aPTT 40 (H) 24 - 36 seconds    Comment:        IF BASELINE aPTT IS ELEVATED, SUGGEST PATIENT RISK ASSESSMENT BE USED TO  DETERMINE APPROPRIATE ANTICOAGULANT THERAPY. Performed at [facility], [address] [place], [place] [address]   MRSA Next Gen by PCR, Nasal     Status: None   Collection Time: 09/18/23  1:54 AM   Specimen: Nasal Mucosa; Nasal Swab  Result Value Ref Range   MRSA by PCR Next Gen NOT DETECTED NOT DETECTED    Comment: (NOTE) The GeneXpert MRSA Assay (FDA approved for NASAL specimens only), is one component of a comprehensive MRSA colonization surveillance program. It is not intended to diagnose MRSA infection nor to guide or monitor treatment for MRSA infections. Test performance is not FDA approved in patients less than 32 years old. Performed at [facility], [address] [place], [place] [address]   Type and screen [facility]     Status: None    Collection Time: 09/18/23  2:03 AM  Result Value Ref Range   ABO/RH(D) B POS    Antibody Screen NEG    Sample Expiration      09/21/2023,2359 Performed at [facility], [address] [place], [place] [address]   Culture, blood (Routine X 2) w Reflex to ID Panel     Status: None (Preliminary result)   Collection Time: 09/18/23  2:22 AM   Specimen: BLOOD LEFT HAND  Result Value Ref Range   Specimen Description BLOOD LEFT HAND    Special Requests      BOTTLES DRAWN AEROBIC ONLY Blood Culture results may not be optimal due to an inadequate volume of blood received in culture bottles   Culture      NO GROWTH < 12 HOURS Performed at [facility], [address], [place], [place] [address]    Report Status PENDING   Culture, blood (Routine X 2) w Reflex to ID Panel     Status: None (Preliminary result)   Collection Time: 09/18/23  2:22 AM   Specimen: BLOOD LEFT HAND  Result Value Ref Range   Specimen Description BLOOD LEFT HAND    Special Requests      BOTTLES DRAWN AEROBIC ONLY Blood Culture results may not be optimal due to an inadequate volume of blood received in culture bottles   Culture      NO GROWTH < 12 HOURS Performed at [facility], [address] [place], [place] [address]    Report Status PENDING    No results found.    Assessment/Plan Rectal bleeding s/p hemorrhoidectomy at [facility] on 9/2 - suspect secondary to supratherapeutic INR, s/p reversal - INR now 1.8 - hgb 7.0 today from 7.5 yesterday, would continue to monitor and may need further transfusion  - no clinical signs of active bleeding currently  - if patient bleeds again would recommend supportive measures and repeat CTA with consideration for embolization  - would recommend reaching out to Dr. [name] so she is aware of events and follow up as planned post-op - general surgery will sign off but are available as needed    I reviewed Consultant IR notes, last 24 h vitals  and pain scores, last 48 h intake and output, last 24 h labs and trends, last 24 h imaging results, and CCM notes.  This care required moderate level of medical decision making.   Burnard JONELLE Louder, [facility] Surgery 09/18/2023, 12:20 PM Please see Amion for pager number during day hours 7:00am-4:30pm

## 2023-09-18 NOTE — Progress Notes (Signed)
  Echocardiogram 2D Echocardiogram has been performed.  Norleen ORN [facility] 09/18/2023, 9:36 AM

## 2023-09-18 NOTE — TOC Initial Note (Addendum)
 Transition of Care [facility]) - Initial/Assessment Note    Patient Details  Name: Nathan Shepherd MRN: [id number] Date of Birth: 1955-12-11  Transition of Care [facility] CM/SW Contact:    Lauraine FORBES Saa, LCSWA Phone Number: 09/18/2023, 11:31 AM  Clinical Narrative:                  11:31 AM Per chart review, patient was transferred to [facility] where he was admitted from [facility]. SNF admissions confirmed patient was STR at SNF for two days and is able to return when medically ready. Patient was at [facility] ALF prior to SNF admission. Patient also has history with [facility] in [place]. Patient has a PCP and insurance. Patient has HH history with [facility] and [facility]. Patient has DME (rolling walker, tub bench) history with [facility]. Patient's preferred pharmacy's are [facility] and [facility]. CSW requested medical team to place PT/OT orders as new therapy evaluations are needed for [facility] for SNF return. CSW will continue to follow and be available to assist.  Expected Discharge Plan: Skilled Nursing Facility Barriers to Discharge: Continued Medical Work up, English as a second language teacher   Patient Goals and CMS Choice            Expected Discharge Plan and Services In-house Referral: Clinical Social Work     Living arrangements for the past 2 months: Assisted Living Facility, Marketing executive, Skilled Nursing Facility                                      Prior Living Arrangements/Services Living arrangements for the past 2 months: Assisted Living Facility, Marketing executive, Skilled Nursing Facility Lives with:: Facility Resident Patient language and need for interpreter reviewed:: Yes        Need for Family Participation in Patient Care: Yes (Comment)   Current home services: DME Criminal Activity/Legal Involvement Pertinent to Current  Situation/Hospitalization: No - Comment as needed  Activities of Daily Living      Permission Sought/Granted Permission sought to share information with : Facility Medical sales representative, Guardian Permission granted to share information with : No (Contact information on chart)  Share Information with NAME: Sypmhanie Lavina  Permission granted to share info w AGENCY: [facility] STR  Permission granted to share info w Relationship: Legal Guardian  Permission granted to share info w Contact Information: [phone]  Emotional Assessment   Attitude/Demeanor/Rapport: Unable to Assess Affect (typically observed): Unable to Assess Orientation: : Oriented to Self, Oriented to Situation Alcohol / Substance Use: Not Applicable Psych Involvement: No (comment)  Admission diagnosis:  Abscess [L02.91] Patient Active Problem List   Diagnosis Date Noted   Abscess 09/18/2023   ABLA (acute blood loss anemia) 09/18/2023   Somnolence 09/11/2023   FUO (fever of unknown origin) 09/11/2023   Acute cystitis without hematuria 09/09/2023   Generalized weakness 09/08/2023   Lower urinary tract infectious disease 09/08/2023   Hemorrhoid prolapse 09/05/2023   Grade IV hemorrhoids 09/05/2023   Rectal bleeding 09/04/2023   Anemia 03/24/2023   Elevated INR 03/24/2023   Rectal prolapse 03/24/2023   Supratherapeutic INR 03/23/2023   Hypoalbuminemia due to protein-calorie malnutrition (HCC) 03/23/2023   Essential hypertension 03/23/2023   Mixed hyperlipidemia 03/23/2023   History of CVA (cerebrovascular accident) - not on DOAC due to interaction with AEDs 03/23/2023   GI bleed 03/22/2023  Seizure (HCC) - cannot be on DOAC due to concurrent treatment with Depakote . 10/22/2022   Atrial fibrillation, chronic (HCC) - cannot be on DOAC due to concurrent treatment with Depakote . 08/25/2021   Acute kidney injury superimposed on chronic kidney disease (HCC) - Baseline scr 1.2-1.6 08/25/2021   PCP:   Gammon, Chrystal, NP Pharmacy:   [facility] - [place], [place] - [address] [place] [address] Phone: [phone] Fax: [phone]  [facility] [address] [place] [address] Phone: [phone] Fax: [phone]     Social Drivers of Health (SDOH) Social History: SDOH Screenings   Food Insecurity: No Food Insecurity (09/08/2023)  Housing: Low Risk  (09/08/2023)  Transportation Needs: No Transportation Needs (09/08/2023)  Utilities: Not At Risk (09/08/2023)  Social Connections: Moderately Isolated (09/08/2023)  Tobacco Use: Low Risk  (09/08/2023)   SDOH Interventions:     Readmission Risk Interventions    09/12/2023    7:53 AM  Readmission Risk Prevention Plan  Transportation Screening Complete  HRI or Home Care Consult Complete  Social Work Consult for Recovery Care Planning/Counseling Complete  Palliative Care Screening Not Applicable  Medication Review Oceanographer) Complete

## 2023-09-18 NOTE — Progress Notes (Signed)
 No further bleeding. Tolerating diet. Will transfer to med surg, trh to assume care 9/16.   [name], MD Pulmonary and Critical Care Medicine [facility] 09/18/2023 6:38 PM Pager: see AMION  If no response to pager, please call critical care on call (see AMION) until 7pm After 7:00 pm call Elink

## 2023-09-18 NOTE — Progress Notes (Signed)
 Interventional Radiology Brief Note  Nathan Shepherd is a 68 year old male with recent rectal bleeding requiring hemorrhoidectomy 09/05/23 by Dr. [name], now readmitted for the second time with recurrent bleeding.  Patient does have a history of afib for which he takes coumadin .  His INR was supratherapeutic on admission at 4.87.  IR consulted for assessment of his case, however no recent imaging to capture bleeding source although hemorrhoidal strongly suspected.  Reviewed case with Dr. Luverne who notes limited role for IR at this time.  Discussed with Dr. Meade.  Priority is to monitor clinical status, resuscitate as needed while trending INR to therapeutic level.  No interventions planned at this time.  CCM to reconsult if needed.   Summer Parthasarathy, MS RD PA-C

## 2023-09-18 NOTE — [place] FL2 (Signed)
 [place]  MEDICAID [id number] LEVEL OF CARE FORM     IDENTIFICATION  Patient Name: Nathan Shepherd Birthdate: 1955-09-29 Sex: male Admission Date (Current Location): 09/18/2023  [facility] and [place] Number:  [employer] and Address:  The . [facility], [address] [place], [place] [address]      Provider Number: [id number]  Attending Physician Name and Address:  Meade Verdon RAMAN, MD  Relative Name and Phone Number:  Ula Bare; Legal Guardian; [phone]    Current Level of Care: Hospital Recommended Level of Care: Skilled Nursing Facility Prior Approval Number:    Date Approved/Denied:   PASRR Number: [id number]  Discharge Plan: SNF    Current Diagnoses: Patient Active Problem List   Diagnosis Date Noted   Abscess 09/18/2023   ABLA (acute blood loss anemia) 09/18/2023   Protein-calorie malnutrition, severe 09/18/2023   Somnolence 09/11/2023   FUO (fever of unknown origin) 09/11/2023   Acute cystitis without hematuria 09/09/2023   Generalized weakness 09/08/2023   Lower urinary tract infectious disease 09/08/2023   Hemorrhoid prolapse 09/05/2023   Grade IV hemorrhoids 09/05/2023   Rectal bleeding 09/04/2023   Anemia 03/24/2023   Elevated INR 03/24/2023   Rectal prolapse 03/24/2023   Supratherapeutic INR 03/23/2023   Hypoalbuminemia due to protein-calorie malnutrition (HCC) 03/23/2023   Essential hypertension 03/23/2023   Mixed hyperlipidemia 03/23/2023   History of CVA (cerebrovascular accident) - not on DOAC due to interaction with AEDs 03/23/2023   GI bleed 03/22/2023   Seizure (HCC) - cannot be on DOAC due to concurrent treatment with Depakote . 10/22/2022   Atrial fibrillation, chronic (HCC) - cannot be on DOAC due to concurrent treatment with Depakote . 08/25/2021   Acute kidney injury superimposed on chronic kidney disease (HCC) - Baseline scr 1.2-1.6 08/25/2021    Orientation RESPIRATION BLADDER Height & Weight      Self, Situation  Normal (Room Air) Incontinent, External catheter Weight: 171 lb 11.8 oz (77.9 kg) Height:     BEHAVIORAL SYMPTOMS/MOOD NEUROLOGICAL BOWEL NUTRITION STATUS    Convulsions/Seizures (History of seizures) Continent Diet (Please see discharge summary)  AMBULATORY STATUS COMMUNICATION OF NEEDS Skin   Extensive Assist Verbally Normal                       Personal Care Assistance Level of Assistance  Bathing, Feeding, Dressing Bathing Assistance: Maximum assistance Feeding assistance: Maximum assistance Dressing Assistance: Maximum assistance     Functional Limitations Info  Sight Sight Info: Impaired (R and L)        SPECIAL CARE FACTORS FREQUENCY  PT (By licensed PT), OT (By licensed OT)     PT Frequency: 5x OT Frequency: 5x            Contractures Contractures Info: Not present    Additional Factors Info  Code Status, Allergies, Insulin  Sliding Scale Code Status Info: Full Code Allergies Info: Penicillins   Insulin  Sliding Scale Info: Please see discharge summary       Current Medications (09/18/2023):  This is the current hospital active medication list Current Facility-Administered Medications  Medication Dose Route Frequency Provider Last Rate Last Admin   calcium  carbonate (TUMS - dosed in mg elemental calcium ) chewable tablet 200 mg of elemental calcium   1 tablet Oral BID PRN Albustami, Omar M, MD       Chlorhexidine  Gluconate Cloth 2 % PADS 6 each  6 each Topical Daily Gleason, Laura R, PA-C   6 each at 09/18/23 1010  ciprofloxacin  (CIPRO ) IVPB 400 mg  400 mg Intravenous Q12H Paliwal, Aditya, MD   Stopped at 09/18/23 1010   divalproex  (DEPAKOTE ) DR tablet 250 mg  250 mg Oral Q8H Albustami, Omar M, MD   250 mg at 09/18/23 1324   docusate sodium  (COLACE) capsule 100 mg  100 mg Oral BID PRN Gleason, Laura R, PA-C       donepezil  (ARICEPT ) tablet 5 mg  5 mg Oral QHS Albustami, Omar M, MD       feeding supplement (ENSURE PLUS HIGH PROTEIN)  liquid 237 mL  237 mL Oral TID BM Desai, Nikita S, MD       levETIRAcetam  (KEPPRA ) tablet 1,000 mg  1,000 mg Oral BID Albustami, Omar M, MD   1,000 mg at 09/18/23 1010   lip balm (CARMEX) ointment   Topical PRN Desai, Nikita S, MD       metroNIDAZOLE  (FLAGYL ) IVPB 500 mg  500 mg Intravenous Q12H Haze Led, MD   Stopped at 09/18/23 1011   multivitamin with minerals tablet 1 tablet  1 tablet Oral Daily Desai, Nikita S, MD       pantoprazole  (PROTONIX ) EC tablet 40 mg  40 mg Oral Daily Albustami, Omar M, MD   40 mg at 09/18/23 1010   polyethylene glycol (MIRALAX  / GLYCOLAX ) packet 17 g  17 g Oral Daily PRN Gleason, Laura R, PA-C         Discharge Medications: Please see discharge summary for a list of discharge medications.  Relevant Imaging Results:  Relevant Lab Results:   Additional Information SSN: [id number]  Jalik Gellatly E Veronika Heard, LCSWA

## 2023-09-18 NOTE — TOC Progression Note (Signed)
 Transition of Care [facility]) - Progression Note    Patient Details  Name: Nathan Shepherd MRN: [id number] Date of Birth: 08/15/1955  [facility]) CM/SW Contact  Lauraine FORBES Saa, LCSWA Phone Number: 09/18/2023, 3:04 PM  Clinical Narrative:     3:04 PM Per chart review, physical therapy recommended patient discharge to SNF. CSW introduced self and role to patient's guardian, Ula Bare at [phone] (patient is not fully oriented). CSW informed Symphanie of physical therapy's recommendation. Symphanie was agreeable with recommendation and patient discharging to [facility]. CSW sent fl2 to SNF.  Expected Discharge Plan: Skilled Nursing Facility Barriers to Discharge: Continued Medical Work up, SNF Pending bed offer               Expected Discharge Plan and Services In-house Referral: Clinical Social Work     Living arrangements for the past 2 months: Assisted Living Facility, Marketing executive, Skilled Nursing Facility                                       Social Drivers of Health (SDOH) Interventions SDOH Screenings   Food Insecurity: No Food Insecurity (09/08/2023)  Housing: Low Risk  (09/08/2023)  Transportation Needs: No Transportation Needs (09/08/2023)  Utilities: Not At Risk (09/08/2023)  Social Connections: Moderately Isolated (09/08/2023)  Tobacco Use: Low Risk  (09/08/2023)    Readmission Risk Interventions    09/12/2023    7:53 AM  Readmission Risk Prevention Plan  Transportation Screening Complete  HRI or Home Care Consult Complete  Social Work Consult for Recovery Care Planning/Counseling Complete  Palliative Care Screening Not Applicable  Medication Review Oceanographer) Complete

## 2023-09-19 ENCOUNTER — Encounter (HOSPITAL_COMMUNITY): Payer: Self-pay | Admitting: Pulmonary Disease

## 2023-09-19 ENCOUNTER — Encounter (HOSPITAL_COMMUNITY): Payer: Self-pay

## 2023-09-19 ENCOUNTER — Other Ambulatory Visit: Payer: Self-pay

## 2023-09-19 ENCOUNTER — Other Ambulatory Visit: Payer: Self-pay | Admitting: Urology

## 2023-09-19 ENCOUNTER — Encounter: Admitting: General Surgery

## 2023-09-19 DIAGNOSIS — L0291 Cutaneous abscess, unspecified: Secondary | ICD-10-CM | POA: Diagnosis not present

## 2023-09-19 LAB — RETICULOCYTES
Immature Retic Fract: 38.2 % — ABNORMAL HIGH (ref 2.3–15.9)
RBC.: 2.46 MIL/uL — ABNORMAL LOW (ref 4.22–5.81)
Retic Count, Absolute: 35.7 K/uL (ref 19.0–186.0)
Retic Ct Pct: 1.5 % (ref 0.4–3.1)

## 2023-09-19 LAB — BASIC METABOLIC PANEL WITH GFR
Anion gap: 11 (ref 5–15)
BUN: 40 mg/dL — ABNORMAL HIGH (ref 8–23)
CO2: 26 mmol/L (ref 22–32)
Calcium: 8.9 mg/dL (ref 8.9–10.3)
Chloride: 109 mmol/L (ref 98–111)
Creatinine, Ser: 1.4 mg/dL — ABNORMAL HIGH (ref 0.61–1.24)
GFR, Estimated: 55 mL/min — ABNORMAL LOW (ref >60)
Glucose, Bld: 113 mg/dL — ABNORMAL HIGH (ref 70–99)
Potassium: 4.8 mmol/L (ref 3.5–5.1)
Sodium: 146 mmol/L — ABNORMAL HIGH (ref 135–145)

## 2023-09-19 LAB — CBC
HCT: 21.2 % — ABNORMAL LOW (ref 39.0–52.0)
Hemoglobin: 6.8 g/dL — CL (ref 13.0–17.0)
MCH: 27.4 pg (ref 26.0–34.0)
MCHC: 32.1 g/dL (ref 30.0–36.0)
MCV: 85.5 fL (ref 80.0–100.0)
Platelets: 234 K/uL (ref 150–400)
RBC: 2.48 MIL/uL — ABNORMAL LOW (ref 4.22–5.81)
RDW: 18 % — ABNORMAL HIGH (ref 11.5–15.5)
WBC: 14.8 K/uL — ABNORMAL HIGH (ref 4.0–10.5)
nRBC: 0 % (ref 0.0–0.2)

## 2023-09-19 LAB — IRON AND TIBC
Iron: 41 ug/dL — ABNORMAL LOW (ref 45–182)
Saturation Ratios: 23 % (ref 17.9–39.5)
TIBC: 175 ug/dL — ABNORMAL LOW (ref 250–450)
UIBC: 134 ug/dL

## 2023-09-19 LAB — FERRITIN: Ferritin: 373 ng/mL — ABNORMAL HIGH (ref 24–336)

## 2023-09-19 LAB — VITAMIN B12: Vitamin B-12: 1314 pg/mL — ABNORMAL HIGH (ref 180–914)

## 2023-09-19 LAB — PREPARE RBC (CROSSMATCH)

## 2023-09-19 LAB — FOLATE: Folate: 10.5 ng/mL (ref >5.9)

## 2023-09-19 MED ORDER — DIVALPROEX SODIUM 250 MG PO DR TAB
750.0000 mg | DELAYED_RELEASE_TABLET | Freq: Two times a day (BID) | ORAL | Status: DC
  Administered 2023-09-19 – 2023-09-24 (×10): 750 mg via ORAL
  Filled 2023-09-19 (×10): qty 3

## 2023-09-19 MED ORDER — SODIUM CHLORIDE 0.9% IV SOLUTION: Freq: Once | INTRAVENOUS | Status: DC

## 2023-09-19 MED ORDER — SODIUM CHLORIDE 0.9 % IV SOLN: INTRAVENOUS | Status: DC

## 2023-09-19 MED ORDER — DIVALPROEX SODIUM 250 MG PO DR TAB
500.0000 mg | DELAYED_RELEASE_TABLET | ORAL | Status: AC
  Administered 2023-09-19: 500 mg via ORAL
  Filled 2023-09-19: qty 2

## 2023-09-19 NOTE — Plan of Care (Signed)
  Problem: Nutrition: Goal: Adequate nutrition will be maintained Outcome: Progressing   Problem: Coping: Goal: Level of anxiety will decrease Outcome: Progressing   Problem: Elimination: Goal: Will not experience complications related to bowel motility Outcome: Progressing   Problem: Pain Managment: Goal: General experience of comfort will improve and/or be controlled Outcome: Progressing   Problem: Safety: Goal: Ability to remain free from injury will improve Outcome: Progressing

## 2023-09-19 NOTE — Progress Notes (Signed)
 PROGRESS NOTE  Nathan Shepherd  DOB: 12/06/1955  PCP: Gammon, Chrystal, NP FMW:[id number]  DOA: 09/18/2023  LOS: 1 day  Hospital Day: 2  Brief narrative: Nathan Shepherd is a 68 y.o. male with PMH significant for HTN, HLD, CHF, CKD, chronic A-fib on Coumadin , CVA, prior substance abuse. Recently hospitalized twice at [facility].   9/1-9/4 for severe hemorrhoidal bleeding s/p hemorrhoidectomy and discharged back to group home. 9/5 to 9/11 for E. coli UTI, generalized weakness and discharged to SNF. 9/14, presented to [facility] after significant amount of blood loss was noted in his diapers.   In the ED, he was hypotensive. Hemoglobin dropped from 9.6 to 6.7, INR 4.87  CTA showed active GI bleeding in the rectum and extensive inflammatory change surrounding the rectum with rim-enhancing fluid collection involving the right aspect of the prostate that may reflect abscess.   Right IJ central line was placed emergently  Patient was given 2 units PRBCs, Kcentra, vitamin K , IV fluid. Started on broad-spectrum IV antibiotics for possible sepsis Was transferred from [facility] to [facility] ICU  While in ICU, blood pressure improved, no further bleeding was noticed.  Patient did not require pressors. 9/16, transferred out to [facility]  Subjective: Patient was seen and examined this morning. Elderly African-American male.  Lying down in bed.  Looks weak.  Talks soft with eyes closed.  Family not at bedside. Urology consult appreciated.  Recommended transfer to [facility] for surgical intervention of prostate abscess.  Assessment and plan: Hemorrhagic shock Presented with significant low blood pressure due to rectal bleed. Blood pressure was significantly low and required aggressive IV hydration PRBC transfusion Did not require pressors Blood running soft for last 24 hours.  Start IV hydration.  Acute on chronic blood loss anemia  Recurrent rectal bleeding Severe  hemorrhoidal bleeding status post hemorrhoidectomy 09/05/2023 S/p 2.5 PRBC units, 3 L NS 0.9%, and reversal of warfarin with Kcentra and Vit K 10 mg IV. Initial CTA showed active bleeding in the rectum but after reversal of Coumadin , bleeding seems to have stopped.  No intervention was required by IR or general surgery Repeat CBC this morning showed hemoglobin low at 6.8.  1 more unit of PRBC transfusion ordered. Recent Labs    09/08/23 0929 09/08/23 0946 09/09/23 0517 09/11/23 0442 09/12/23 0507 09/18/23 0140 09/18/23 1220 09/19/23 0918  HGB  --  11.9*   < > 10.2* 11.2* 7.5* 7.0* 6.8*  MCV  --  88.5   < > 89.7 89.6 85.0 84.6 85.5  VITAMINB12  --  760  --   --   --   --   --  1,314*  FOLATE 9.0  --   --   --   --   --   --  10.5  FERRITIN  --   --   --   --   --   --   --  373*  TIBC  --   --   --   --   --   --   --  175*  IRON  --   --   --   --   --   --   --  41*  RETICCTPCT  --   --   --   --   --   --   --  1.5   < > = values in this interval not displayed.   Prostate abscess CT pelvis obtained at [facility] was reviewed by  urology.  Patient has 5 cm prostate abscess and needs to be drained surgically.  Plan is to transfer to [name] for surgical intervention tomorrow. Continue IV Cipro  and Flagyl  Discussed with Dr. Carolee Recent Labs  Lab 09/18/23 0140 09/18/23 1220 09/19/23 0918  WBC 14.2* 13.6* 14.8*  LATICACIDVEN 1.0  --   --    Chronic A-fib Chronically anticoagulated with Coumadin  Given significant bleeding, Coumadin  was reversed. INR 1.8 today. Recent Labs  Lab 09/13/23 0442 09/14/23 0522 09/15/23 0552 09/18/23 0140  INR 2.3* 2.8* 2.0* 1.8*   H/o chronic diastolic CHF HTN Echo 9/15 with EF 55 to 60%, no WMA, moderately dilated LA Blood pressure in the low normal range PTA meds- Coreg , torsemide  Currently both on hold  CAD, HLD Coumadin  and statin both on hold for now  CKD 3A Creatinine at baseline.  Recent Labs    09/04/23 0910  09/05/23 0437 09/08/23 0946 09/09/23 0517 09/10/23 0431 09/11/23 0442 09/12/23 0507 09/13/23 0442 09/18/23 0140 09/19/23 0918  BUN 17 20 29* 29* 25* 28* 34* 40* 36*  37* 40*  CREATININE 1.30* 1.34* 1.43* 1.26* 1.20 1.37* 1.39* 1.44* 1.39*  1.41* 1.40*  CO2 28 32 32 30 28 31 29 32 28   29 26   Seizure disorder PTA meds- Keppra , Depakote  Continue both.  I have asked pharmacy to review the dose of Depakote  outpatient and inpatient  H/o polysubstance abuse Reports no recent abuse  Dementia Continue Aricept     Mobility:  PT Orders: Active   PT Follow up Rec: Skilled Nursing-Short Term Rehab (<3 Hours/Day)09/18/2023 1356   Goals of care   Code Status: Full Code     DVT prophylaxis:  SCDs Start: 09/18/23 0102   Antimicrobials: IV Cipro  and Flagyl  Fluid: NS at 75 mL/h, 1 unit of PRBC Consultants: Urology Family Communication: None at bedside.  It seems he has a legal guardian Symphanie boyce.  I tried 2 different numbers without success.   Status: Inpatient Level of care:  Progressive   Patient is from: SNF Needs to continue in-hospital care: Needs prostate surgery tomorrow.  Need to monitor for further bleeding Anticipated d/c to: Pending clinical course      Diet:  Diet Order             Diet NPO time specified  Diet effective midnight           Diet regular Room service appropriate? Yes with Assist; Fluid consistency: Thin  Diet effective now                   Scheduled Meds:  sodium chloride    Intravenous Once   Chlorhexidine  Gluconate Cloth  6 each Topical Daily   dextrose   25 g Intravenous Once   divalproex   250 mg Oral Q8H   donepezil   5 mg Oral QHS   feeding supplement  237 mL Oral TID BM   levETIRAcetam   1,000 mg Oral BID   multivitamin with minerals  1 tablet Oral Daily   pantoprazole   40 mg Oral Daily    PRN meds: calcium  carbonate, docusate sodium , lip balm, polyethylene glycol   Infusions:   sodium chloride  75 mL/hr at  09/19/23 1155   ciprofloxacin  400 mg (09/19/23 1049)   metronidazole  500 mg (09/19/23 0911)    Antimicrobials: Anti-infectives (From admission, onward)    Start     Dose/Rate Route Frequency Ordered Stop   09/18/23 0800  piperacillin -tazobactam (ZOSYN ) IVPB 3.375 g  Status:  Discontinued  3.375 g 12.5 mL/hr over 240 Minutes Intravenous Every 8 hours 09/18/23 0136 09/18/23 0350   09/18/23 0800  ciprofloxacin  (CIPRO ) IVPB 400 mg        400 mg 200 mL/hr over 60 Minutes Intravenous Every 12 hours 09/18/23 0350     09/18/23 0800  metroNIDAZOLE  (FLAGYL ) IVPB 500 mg        500 mg 100 mL/hr over 60 Minutes Intravenous Every 12 hours 09/18/23 0350     09/18/23 0200  piperacillin -tazobactam (ZOSYN ) IVPB 3.375 g        3.375 g 100 mL/hr over 30 Minutes Intravenous  Once 09/18/23 0136 09/18/23 0223       Objective: Vitals:   09/19/23 0805 09/19/23 1138  BP: 104/68 100/67  Pulse: 89 84  Resp: 18   Temp: 98.3 F (36.8 C) 98.3 F (36.8 C)  SpO2: 98% 100%    Intake/Output Summary (Last 24 hours) at 09/19/2023 1158 Last data filed at 09/19/2023 1146 Gross per 24 hour  Intake 1167.75 ml  Output 1210 ml  Net -42.25 ml   Filed Weights   09/18/23 0500 09/19/23 0723  Weight: 77.9 kg 78 kg   Weight change:  Body mass index is 20.93 kg/m.   Physical Exam: General exam: Pleasant, elderly African-American male Skin: No rashes, lesions or ulcers. HEENT: Atraumatic, normocephalic, no obvious bleeding Lungs: Clear to auscultation bilaterally,  CVS: S1, S2, no murmur,   GI/Abd: Soft, nontender, nondistended, bowel sound present,   CNS: Somnolent, opens eyes on command but prefers to talk with eyes closed. Psychiatry: Sad affect Extremities: No pedal edema, no calf tenderness,   Data Review: I have personally reviewed the laboratory data and studies available.  F/u labs ordered Unresulted Labs (From admission, onward)     Start     Ordered   09/20/23 0500  CBC with  Differential/Platelet  Daily,   R     Question:  Specimen collection method  Answer:  Unit=Unit collect   09/19/23 0810   09/20/23 0500  Basic metabolic panel with GFR  Daily,   R     Question:  Specimen collection method  Answer:  Unit=Unit collect   09/19/23 0810            Signed, Chapman Rota, MD [facility] 09/19/2023

## 2023-09-19 NOTE — H&P (View-Only) (Signed)
 Urology Consult Note   Requesting Attending Physician:  [name], MD Service Providing Consult: Urology  Consulting Attending: Dr. Carolee   Reason for Consult:  prostate abscess  HPI: Nathan Shepherd is seen in consultation for reasons noted above at the request of [name], MD. Patient is a 68 y.o. male presenting from assisted living facility with a large amount of frank blood found in diaper following a hemorrhoidectomy on 09/05/2023 with Dr. [name] at [facility].  PMH significant for A-fib on Coumadin , Hx of CVA, seizure disorder, polysubstance abuse, CAD, HTN, HLD, CKD 3A, and question of dementia.  CT A/P also noted multiloculated rim-enhancing fluid collection involving the right aspect of the prostate measures 53 x 51 mm.  [facility] was consulted to speak to these findings.  On arrival patient was alert, oriented, and in no distress.  He denies any discomfort in the perineal area.  He denies hematuria or recent urinary tract infection.  Reviewed case and plan to the patient expressed understanding.  I spent a very large amount of time communicating with [facility] and canopy with an attempt to have his actual images released to us .  They were never made available. ------------------  Assessment:   68 y.o. male with BRBPR s/p hemorrhoidectomy with incidental finding of 5cm prostate abscess   Recommendations: #prostate abscess Due to the large size of the prostatic abscess, Dr. Carolee has elected to have patient transferred to [facility] for cystoscopy with TURP/prostate unroofing. Trend labs.  Hgb 6.8.  1 unit PRBC today per primary team. Urology will follow  Case and plan discussed with Dr. Carolee  Past Medical History: Past Medical History:  Diagnosis Date   CHF (congestive heart failure) (HCC)    CKD (chronic kidney disease) stage 3, GFR 30-59 ml/min (HCC)    Edema    Hyperlipemia    Hypertension    Seizures (HCC)     Past Surgical  History:  Past Surgical History:  Procedure Laterality Date   COLONOSCOPY N/A 03/24/2023   Procedure: COLONOSCOPY;  Surgeon: Cinderella [name], MD;  Location: [facility];  Service: Endoscopy;  Laterality: N/A;   ESOPHAGOGASTRODUODENOSCOPY N/A 03/24/2023   Procedure: EGD (ESOPHAGOGASTRODUODENOSCOPY);  Surgeon: Cinderella [name], MD;  Location: [facility];  Service: Endoscopy;  Laterality: N/A;   HEMORRHOID SURGERY N/A 09/05/2023   Procedure: EXTENSIVE HEMORRHOIDECTOMY;  Surgeon: [name] Manuelita BROCKS, MD;  Location: [facility] ORS;  Service: General;  Laterality: N/A;   NO PAST SURGERIES      Medication: Current Facility-Administered Medications  Medication Dose Route Frequency Provider Last Rate Last Admin   0.9 %  sodium chloride  infusion (Manually program via Guardrails IV Fluids)   Intravenous Once Dahal, Reichert, MD       0.9 %  sodium chloride  infusion   Intravenous Continuous Dahal, Reichert, MD 75 mL/hr at 09/19/23 1155 New Bag at 09/19/23 1155   calcium  carbonate (TUMS - dosed in mg elemental calcium ) chewable tablet 200 mg of elemental calcium   1 tablet Oral BID PRN Dub Mancel HERO, MD       Chlorhexidine  Gluconate Cloth 2 % PADS 6 each  6 each Topical Daily Gleason, Laura R, PA-C   6 each at 09/19/23 0912   ciprofloxacin  (CIPRO ) IVPB 400 mg  400 mg Intravenous Q12H Paliwal, Aditya, MD 200 mL/hr at 09/19/23 1049 400 mg at 09/19/23 1049   dextrose  50 % solution 25 g  25 g Intravenous Once Desai, Nikita S, MD  divalproex  (DEPAKOTE ) DR tablet 500 mg  500 mg Oral STAT Dahal, Binaya, MD       divalproex  (DEPAKOTE ) DR tablet 750 mg  750 mg Oral Q12H Dahal, Binaya, MD       docusate sodium  (COLACE) capsule 100 mg  100 mg Oral BID PRN Gleason, Laura R, PA-C       donepezil  (ARICEPT ) tablet 5 mg  5 mg Oral QHS Albustami, Omar M, MD   5 mg at 09/18/23 2300   feeding supplement (ENSURE PLUS HIGH PROTEIN) liquid 237 mL  237 mL Oral TID BM Desai, Nikita S, MD   237 mL at 09/19/23 0912    levETIRAcetam  (KEPPRA ) tablet 1,000 mg  1,000 mg Oral BID Albustami, Omar M, MD   1,000 mg at 09/19/23 0900   lip balm (CARMEX) ointment   Topical PRN Desai, Nikita S, MD   Given at 09/18/23 1625   metroNIDAZOLE  (FLAGYL ) IVPB 500 mg  500 mg Intravenous Q12H Paliwal, Aditya, MD 100 mL/hr at 09/19/23 0911 500 mg at 09/19/23 0911   multivitamin with minerals tablet 1 tablet  1 tablet Oral Daily Desai, Nikita S, MD   1 tablet at 09/19/23 0900   pantoprazole  (PROTONIX ) EC tablet 40 mg  40 mg Oral Daily Albustami, Omar M, MD   40 mg at 09/19/23 0900   polyethylene glycol (MIRALAX  / GLYCOLAX ) packet 17 g  17 g Oral Daily PRN Gleason, Laura R, PA-C        Allergies: Allergies  Allergen Reactions   Penicillins Hives    Tolerated Zosyn     Social History: Social History   Tobacco Use   Smoking status: Never   Smokeless tobacco: Never  Vaping Use   Vaping status: Never Used  Substance Use Topics   Alcohol use: Not Currently   Drug use: Never    Family History Family History  Problem Relation Age of Onset   Colon cancer Neg Hx    Liver disease Neg Hx     Review of Systems  Genitourinary:  Negative for dysuria, flank pain, frequency, hematuria and urgency.     Objective   Vital signs in last 24 hours: BP 103/83   Pulse 85   Temp 98.1 F (36.7 C) (Oral)   Resp (!) 26   Ht 6' 4 (1.93 m)   Wt 78 kg   SpO2 100%   BMI 20.93 kg/m   Physical Exam General: A&O, resting, appropriate HEENT: [place]/AT Pulmonary: Normal work of breathing Cardiovascular: no cyanosis Abdomen: Soft, NTTP, nondistended \   Most Recent Labs: Lab Results  Component Value Date   WBC 14.8 (H) 09/19/2023   HGB 6.8 (LL) 09/19/2023   HCT 21.2 (L) 09/19/2023   PLT 234 09/19/2023    Lab Results  Component Value Date   NA 146 (H) 09/19/2023   K 4.8 09/19/2023   CL 109 09/19/2023   CO2 26 09/19/2023   BUN 40 (H) 09/19/2023   CREATININE 1.40 (H) 09/19/2023   CALCIUM  8.9 09/19/2023   MG 1.6 (L)  09/18/2023   PHOS 2.9 09/18/2023    Lab Results  Component Value Date   INR 1.8 (H) 09/18/2023   APTT 40 (H) 09/18/2023     Urine Culture: @[id number] (laburin,org,[id number])@   IMAGING: ECHOCARDIOGRAM COMPLETE Result Date: 09/18/2023    ECHOCARDIOGRAM REPORT   Patient Name:   MOHIT ZIRBES [facility] Date of Exam: 09/18/2023 Medical Rec #:  [id number]             Height:  76.0 in Accession #:    [id number]            Weight:       171.7 lb Date of Birth:  April 10, 1955              BSA:          2.076 m Patient Age:    68 years              BP:           138/98 mmHg Patient Gender: M                     HR:           83 bpm. Exam Location:  Inpatient Procedure: 2D Echo (Both Spectral and Color Flow Doppler were utilized during            procedure). Indications:    Afib  History:        Patient has prior history of Echocardiogram examinations.                 Arrythmias:Atrial Fibrillation.  Sonographer:    Norleen Amour Referring Phys: [id number] OMAR M ALBUSTAMI IMPRESSIONS  1. Left ventricular ejection fraction, by estimation, is 55 to 60%. The left ventricle has normal function. The left ventricle has no regional wall motion abnormalities. There is mild left ventricular hypertrophy. Left ventricular diastolic function could not be evaluated.  2. Right ventricular systolic function is mildly reduced. The right ventricular size is normal.  3. Left atrial size was moderately dilated.  4. Right atrial size was mildly dilated.  5. The mitral valve is normal in structure. No evidence of mitral valve regurgitation. No evidence of mitral stenosis.  6. The aortic valve is normal in structure. There is mild calcification of the aortic valve. Aortic valve regurgitation is trivial. No aortic stenosis is present.  7. The inferior vena cava is normal in size with greater than 50% respiratory variability, suggesting right atrial pressure of 3 mmHg. FINDINGS  Left Ventricle: Left ventricular ejection fraction, by  estimation, is 55 to 60%. The left ventricle has normal function. The left ventricle has no regional wall motion abnormalities. The left ventricular internal cavity size was normal in size. There is  mild left ventricular hypertrophy. Left ventricular diastolic function could not be evaluated due to atrial fibrillation. Left ventricular diastolic function could not be evaluated. Right Ventricle: The right ventricular size is normal. No increase in right ventricular wall thickness. Right ventricular systolic function is mildly reduced. Left Atrium: Left atrial size was moderately dilated. Right Atrium: Right atrial size was mildly dilated. Pericardium: Trivial pericardial effusion is present. Mitral Valve: The mitral valve is normal in structure. No evidence of mitral valve regurgitation. No evidence of mitral valve stenosis. Tricuspid Valve: The tricuspid valve is normal in structure. Tricuspid valve regurgitation is trivial. No evidence of tricuspid stenosis. Aortic Valve: The aortic valve is normal in structure. There is mild calcification of the aortic valve. Aortic valve regurgitation is trivial. No aortic stenosis is present. Pulmonic Valve: The pulmonic valve was normal in structure. Pulmonic valve regurgitation is not visualized. No evidence of pulmonic stenosis. Aorta: The aortic root is normal in size and structure. Venous: The inferior vena cava is normal in size with greater than 50% respiratory variability, suggesting right atrial pressure of 3 mmHg. IAS/Shunts: No atrial level shunt detected by color flow Doppler.  LEFT VENTRICLE PLAX 2D LVIDd:  4.20 cm     Diastology LVIDs:         2.70 cm     LV e' medial:    9.32 cm/s LV PW:         1.30 cm     LV E/e' medial:  6.3 LV IVS:        1.20 cm     LV e' lateral:   12.30 cm/s LVOT diam:     2.10 cm     LV E/e' lateral: 4.8 LV SV:         38 LV SV Index:   19 LVOT Area:     3.46 cm  LV Volumes (MOD) LV vol d, MOD A2C: 52.9 ml LV vol d, MOD A4C: 64.5  ml LV vol s, MOD A2C: 31.3 ml LV vol s, MOD A4C: 28.6 ml LV SV MOD A2C:     21.6 ml LV SV MOD A4C:     64.5 ml LV SV MOD BP:      27.9 ml RIGHT VENTRICLE RV Basal diam:  2.00 cm RV S prime:     5.98 cm/s TAPSE (M-mode): 1.5 cm LEFT ATRIUM              Index        RIGHT ATRIUM           Index LA diam:        4.70 cm  2.26 cm/m   RA Area:     18.80 cm LA Vol (A2C):   119.0 ml 57.31 ml/m  RA Volume:   42.70 ml  20.57 ml/m LA Vol (A4C):   56.7 ml  27.31 ml/m LA Biplane Vol: 87.8 ml  42.29 ml/m  AORTIC VALVE LVOT Vmax:   76.83 cm/s LVOT Vmean:  48.067 cm/s LVOT VTI:    0.111 m  AORTA Ao Root diam: 2.90 cm Ao Asc diam:  3.10 cm MV E velocity: 59.17 cm/s  TRICUSPID VALVE                            TR Peak grad:   23.0 mmHg                            TR Vmax:        240.00 cm/s                             SHUNTS                            Systemic VTI:  0.11 m                            Systemic Diam: 2.10 cm Aditya Sabharwal Electronically signed by Ria Commander Signature Date/Time: 09/18/2023/1:00:14 PM    Final     ------  Ole Bourdon, NP Pager: [phone]   Please contact the urology consult pager with any further questions/concerns.

## 2023-09-19 NOTE — Progress Notes (Addendum)
 [facility]  No Charge. Chart Checking Patient who was suppose to see me in the office today for follow up.  He has been admitted to the hospital.  [facility] saw him in the hospital (much appreciated). He had repeat rectal bleeding despite hemorrhoidectomy in early September. I am concerned that with the supra-therapeutic INRs (4.87 this admission) that have been repeatedly a problem that he will continue to bleed. I have removed the hemorrhoid tissue and feel that that the anti-coagulation at this point is making him more prone to bleeding and is the main issue.  I have reached out to his hospitalist Dr. Arlice to see about the risk/benefit of the coumadin  for A fib given the repeat supra-therapeutic INRs and if another option for anticoagulation is possible.  Possibly cardiology could weigh in if appropriate.   I have scheduled follow up with me for the patient, 10/7 was the soonest date that his facility to bring him to the office.   Future Appointments  Date Time Provider Department Center  10/10/2023 10:00 AM [name] Manuelita BROCKS, MD [facility] None  10/13/2023  8:15 AM Gaynel Delon CROME, DPM [facility]    Manuelita [name], MD [facility] [address] [place], [place] [address] [phone] (office)

## 2023-09-19 NOTE — Plan of Care (Signed)
 Patient was transferred overnight for shock/rectal bleeding s/p hemorrhoidectomy.  There is reports which can be verified from care everywhere that there was a 53 x 51 mm loculated rim-enhancing fluid collection in the right aspect of the prostate.  I spent a notable part of my morning trying to obtain this through [employer] and [employer], unsuccessfully.  I have reordered a stat pelvic CT with contrast to further characterize this probable abscess and what if any surgery is indicated.  He was unfortunately provided breakfast which will keep us  from being able to act on any findings today. Urology will formally consult once I have some imaging to work off of.

## 2023-09-19 NOTE — Plan of Care (Signed)
   Problem: Education: Goal: Knowledge of General Education information will improve Description Including pain rating scale, medication(s)/side effects and non-pharmacologic comfort measures Outcome: Progressing   Problem: Health Behavior/Discharge Planning: Goal: Ability to manage health-related needs will improve Outcome: Progressing

## 2023-09-19 NOTE — Consult Note (Signed)
 Urology Consult Note   Requesting Attending Physician:  [name], MD Service Providing Consult: Urology  Consulting Attending: Dr. Carolee   Reason for Consult:  prostate abscess  HPI: Nathan Shepherd is seen in consultation for reasons noted above at the request of [name], MD. Patient is a 68 y.o. male presenting from assisted living facility with a large amount of frank blood found in diaper following a hemorrhoidectomy on 09/05/2023 with Dr. [name] at [facility].  PMH significant for A-fib on Coumadin , Hx of CVA, seizure disorder, polysubstance abuse, CAD, HTN, HLD, CKD 3A, and question of dementia.  CT A/P also noted multiloculated rim-enhancing fluid collection involving the right aspect of the prostate measures 53 x 51 mm.  [facility] was consulted to speak to these findings.  On arrival patient was alert, oriented, and in no distress.  He denies any discomfort in the perineal area.  He denies hematuria or recent urinary tract infection.  Reviewed case and plan to the patient expressed understanding.  I spent a very large amount of time communicating with [facility] and canopy with an attempt to have his actual images released to us .  They were never made available. ------------------  Assessment:   68 y.o. male with BRBPR s/p hemorrhoidectomy with incidental finding of 5cm prostate abscess   Recommendations: #prostate abscess Due to the large size of the prostatic abscess, Dr. Carolee has elected to have patient transferred to [facility] for cystoscopy with TURP/prostate unroofing. Trend labs.  Hgb 6.8.  1 unit PRBC today per primary team. Urology will follow  Case and plan discussed with Dr. Carolee  Past Medical History: Past Medical History:  Diagnosis Date   CHF (congestive heart failure) (HCC)    CKD (chronic kidney disease) stage 3, GFR 30-59 ml/min (HCC)    Edema    Hyperlipemia    Hypertension    Seizures (HCC)     Past Surgical  History:  Past Surgical History:  Procedure Laterality Date   COLONOSCOPY N/A 03/24/2023   Procedure: COLONOSCOPY;  Surgeon: Cinderella [name], MD;  Location: [facility];  Service: Endoscopy;  Laterality: N/A;   ESOPHAGOGASTRODUODENOSCOPY N/A 03/24/2023   Procedure: EGD (ESOPHAGOGASTRODUODENOSCOPY);  Surgeon: Cinderella [name], MD;  Location: [facility];  Service: Endoscopy;  Laterality: N/A;   HEMORRHOID SURGERY N/A 09/05/2023   Procedure: EXTENSIVE HEMORRHOIDECTOMY;  Surgeon: [name] Manuelita BROCKS, MD;  Location: [facility] ORS;  Service: General;  Laterality: N/A;   NO PAST SURGERIES      Medication: Current Facility-Administered Medications  Medication Dose Route Frequency Provider Last Rate Last Admin   0.9 %  sodium chloride  infusion (Manually program via Guardrails IV Fluids)   Intravenous Once Dahal, Reichert, MD       0.9 %  sodium chloride  infusion   Intravenous Continuous Dahal, Reichert, MD 75 mL/hr at 09/19/23 1155 New Bag at 09/19/23 1155   calcium  carbonate (TUMS - dosed in mg elemental calcium ) chewable tablet 200 mg of elemental calcium   1 tablet Oral BID PRN Albustami, Omar M, MD       Chlorhexidine  Gluconate Cloth 2 % PADS 6 each  6 each Topical Daily Gleason, Laura R, PA-C   6 each at 09/19/23 0912   ciprofloxacin  (CIPRO ) IVPB 400 mg  400 mg Intravenous Q12H Paliwal, Aditya, MD 200 mL/hr at 09/19/23 1049 400 mg at 09/19/23 1049   dextrose  50 % solution 25 g  25 g Intravenous Once Desai, Nikita S, MD  divalproex  (DEPAKOTE ) DR tablet 500 mg  500 mg Oral STAT Dahal, Binaya, MD       divalproex  (DEPAKOTE ) DR tablet 750 mg  750 mg Oral Q12H Dahal, Binaya, MD       docusate sodium  (COLACE) capsule 100 mg  100 mg Oral BID PRN Gleason, Laura R, PA-C       donepezil  (ARICEPT ) tablet 5 mg  5 mg Oral QHS Albustami, Omar M, MD   5 mg at 09/18/23 2300   feeding supplement (ENSURE PLUS HIGH PROTEIN) liquid 237 mL  237 mL Oral TID BM Desai, Nikita S, MD   237 mL at 09/19/23 0912    levETIRAcetam  (KEPPRA ) tablet 1,000 mg  1,000 mg Oral BID Albustami, Omar M, MD   1,000 mg at 09/19/23 0900   lip balm (CARMEX) ointment   Topical PRN Desai, Nikita S, MD   Given at 09/18/23 1625   metroNIDAZOLE  (FLAGYL ) IVPB 500 mg  500 mg Intravenous Q12H Paliwal, Aditya, MD 100 mL/hr at 09/19/23 0911 500 mg at 09/19/23 0911   multivitamin with minerals tablet 1 tablet  1 tablet Oral Daily Desai, Nikita S, MD   1 tablet at 09/19/23 0900   pantoprazole  (PROTONIX ) EC tablet 40 mg  40 mg Oral Daily Albustami, Omar M, MD   40 mg at 09/19/23 0900   polyethylene glycol (MIRALAX  / GLYCOLAX ) packet 17 g  17 g Oral Daily PRN Gleason, Laura R, PA-C        Allergies: Allergies  Allergen Reactions   Penicillins Hives    Tolerated Zosyn     Social History: Social History   Tobacco Use   Smoking status: Never   Smokeless tobacco: Never  Vaping Use   Vaping status: Never Used  Substance Use Topics   Alcohol use: Not Currently   Drug use: Never    Family History Family History  Problem Relation Age of Onset   Colon cancer Neg Hx    Liver disease Neg Hx     Review of Systems  Genitourinary:  Negative for dysuria, flank pain, frequency, hematuria and urgency.     Objective   Vital signs in last 24 hours: BP 103/83   Pulse 85   Temp 98.1 F (36.7 C) (Oral)   Resp (!) 26   Ht 6' 4 (1.93 m)   Wt 78 kg   SpO2 100%   BMI 20.93 kg/m   Physical Exam General: A&O, resting, appropriate HEENT: [place]/AT Pulmonary: Normal work of breathing Cardiovascular: no cyanosis Abdomen: Soft, NTTP, nondistended \   Most Recent Labs: Lab Results  Component Value Date   WBC 14.8 (H) 09/19/2023   HGB 6.8 (LL) 09/19/2023   HCT 21.2 (L) 09/19/2023   PLT 234 09/19/2023    Lab Results  Component Value Date   NA 146 (H) 09/19/2023   K 4.8 09/19/2023   CL 109 09/19/2023   CO2 26 09/19/2023   BUN 40 (H) 09/19/2023   CREATININE 1.40 (H) 09/19/2023   CALCIUM  8.9 09/19/2023   MG 1.6 (L)  09/18/2023   PHOS 2.9 09/18/2023    Lab Results  Component Value Date   INR 1.8 (H) 09/18/2023   APTT 40 (H) 09/18/2023     Urine Culture: @[id number] (laburin,org,[id number])@   IMAGING: ECHOCARDIOGRAM COMPLETE Result Date: 09/18/2023    ECHOCARDIOGRAM REPORT   Patient Name:   KWASI JOUNG [facility] Date of Exam: 09/18/2023 Medical Rec #:  [id number]             Height:  76.0 in Accession #:    [id number]            Weight:       171.7 lb Date of Birth:  [birth year]              BSA:          2.076 m Patient Age:    68 years              BP:           138/98 mmHg Patient Gender: M                     HR:           83 bpm. Exam Location:  Inpatient Procedure: 2D Echo (Both Spectral and Color Flow Doppler were utilized during            procedure). Indications:    Afib  History:        Patient has prior history of Echocardiogram examinations.                 Arrythmias:Atrial Fibrillation.  Sonographer:    Norleen Amour Referring Phys: [id number] OMAR M ALBUSTAMI IMPRESSIONS  1. Left ventricular ejection fraction, by estimation, is 55 to 60%. The left ventricle has normal function. The left ventricle has no regional wall motion abnormalities. There is mild left ventricular hypertrophy. Left ventricular diastolic function could not be evaluated.  2. Right ventricular systolic function is mildly reduced. The right ventricular size is normal.  3. Left atrial size was moderately dilated.  4. Right atrial size was mildly dilated.  5. The mitral valve is normal in structure. No evidence of mitral valve regurgitation. No evidence of mitral stenosis.  6. The aortic valve is normal in structure. There is mild calcification of the aortic valve. Aortic valve regurgitation is trivial. No aortic stenosis is present.  7. The inferior vena cava is normal in size with greater than 50% respiratory variability, suggesting right atrial pressure of 3 mmHg. FINDINGS  Left Ventricle: Left ventricular ejection fraction, by  estimation, is 55 to 60%. The left ventricle has normal function. The left ventricle has no regional wall motion abnormalities. The left ventricular internal cavity size was normal in size. There is  mild left ventricular hypertrophy. Left ventricular diastolic function could not be evaluated due to atrial fibrillation. Left ventricular diastolic function could not be evaluated. Right Ventricle: The right ventricular size is normal. No increase in right ventricular wall thickness. Right ventricular systolic function is mildly reduced. Left Atrium: Left atrial size was moderately dilated. Right Atrium: Right atrial size was mildly dilated. Pericardium: Trivial pericardial effusion is present. Mitral Valve: The mitral valve is normal in structure. No evidence of mitral valve regurgitation. No evidence of mitral valve stenosis. Tricuspid Valve: The tricuspid valve is normal in structure. Tricuspid valve regurgitation is trivial. No evidence of tricuspid stenosis. Aortic Valve: The aortic valve is normal in structure. There is mild calcification of the aortic valve. Aortic valve regurgitation is trivial. No aortic stenosis is present. Pulmonic Valve: The pulmonic valve was normal in structure. Pulmonic valve regurgitation is not visualized. No evidence of pulmonic stenosis. Aorta: The aortic root is normal in size and structure. Venous: The inferior vena cava is normal in size with greater than 50% respiratory variability, suggesting right atrial pressure of 3 mmHg. IAS/Shunts: No atrial level shunt detected by color flow Doppler.  LEFT VENTRICLE PLAX 2D LVIDd:  4.20 cm     Diastology LVIDs:         2.70 cm     LV e' medial:    9.32 cm/s LV PW:         1.30 cm     LV E/e' medial:  6.3 LV IVS:        1.20 cm     LV e' lateral:   12.30 cm/s LVOT diam:     2.10 cm     LV E/e' lateral: 4.8 LV SV:         38 LV SV Index:   19 LVOT Area:     3.46 cm  LV Volumes (MOD) LV vol d, MOD A2C: 52.9 ml LV vol d, MOD A4C: 64.5  ml LV vol s, MOD A2C: 31.3 ml LV vol s, MOD A4C: 28.6 ml LV SV MOD A2C:     21.6 ml LV SV MOD A4C:     64.5 ml LV SV MOD BP:      27.9 ml RIGHT VENTRICLE RV Basal diam:  2.00 cm RV S prime:     5.98 cm/s TAPSE (M-mode): 1.5 cm LEFT ATRIUM              Index        RIGHT ATRIUM           Index LA diam:        4.70 cm  2.26 cm/m   RA Area:     18.80 cm LA Vol (A2C):   119.0 ml 57.31 ml/m  RA Volume:   42.70 ml  20.57 ml/m LA Vol (A4C):   56.7 ml  27.31 ml/m LA Biplane Vol: 87.8 ml  42.29 ml/m  AORTIC VALVE LVOT Vmax:   76.83 cm/s LVOT Vmean:  48.067 cm/s LVOT VTI:    0.111 m  AORTA Ao Root diam: 2.90 cm Ao Asc diam:  3.10 cm MV E velocity: 59.17 cm/s  TRICUSPID VALVE                            TR Peak grad:   23.0 mmHg                            TR Vmax:        240.00 cm/s                             SHUNTS                            Systemic VTI:  0.11 m                            Systemic Diam: 2.10 cm Aditya Sabharwal Electronically signed by Ria Commander Signature Date/Time: 09/18/2023/1:00:14 PM    Final     ------  Ole Bourdon, NP Pager: [phone]   Please contact the urology consult pager with any further questions/concerns.

## 2023-09-19 NOTE — Progress Notes (Signed)
 OT Cancellation Note  Patient Details Name: Nathan Shepherd MRN: [id number] DOB: Mar 13, 1955   Cancelled Treatment:    Reason Eval/Treat Not Completed: Patient not medically ready. Per RN, pt tachypnic with low hgb with plans for transfer to [facility] this afternoon for procedure. Will follow up as medically ready.   [name] FREDERICK, OTR/L [facility] Office: [phone]   [name] 09/19/2023, 1:04 PM

## 2023-09-19 NOTE — Progress Notes (Signed)
 Date and time results received: 09/19/23 10:41 AM   Test: Hemoglobin Critical Value: 6.8  Name of Provider Notified: Chapman Rota, MD  Orders Received? Or Actions Taken?: Awaiting provider response.

## 2023-09-20 ENCOUNTER — Encounter (HOSPITAL_COMMUNITY)
Admission: EM | Disposition: A | Discharge status: Skilled Nursing Facility | Payer: Self-pay | Source: Skilled Nursing Facility | Attending: Internal Medicine

## 2023-09-20 ENCOUNTER — Inpatient Hospital Stay (HOSPITAL_COMMUNITY): Admitting: Anesthesiology

## 2023-09-20 ENCOUNTER — Inpatient Hospital Stay (HOSPITAL_COMMUNITY)

## 2023-09-20 ENCOUNTER — Other Ambulatory Visit: Payer: Self-pay

## 2023-09-20 DIAGNOSIS — D62 Acute posthemorrhagic anemia: Secondary | ICD-10-CM

## 2023-09-20 DIAGNOSIS — K625 Hemorrhage of anus and rectum: Secondary | ICD-10-CM | POA: Diagnosis not present

## 2023-09-20 DIAGNOSIS — I10 Essential (primary) hypertension: Secondary | ICD-10-CM

## 2023-09-20 DIAGNOSIS — E870 Hyperosmolality and hypernatremia: Secondary | ICD-10-CM | POA: Diagnosis not present

## 2023-09-20 DIAGNOSIS — R569 Unspecified convulsions: Secondary | ICD-10-CM | POA: Diagnosis not present

## 2023-09-20 DIAGNOSIS — N412 Abscess of prostate: Secondary | ICD-10-CM

## 2023-09-20 DIAGNOSIS — I482 Chronic atrial fibrillation, unspecified: Secondary | ICD-10-CM | POA: Diagnosis not present

## 2023-09-20 HISTORY — PX: TRANSURETHRAL RESECTION OF PROSTATE: SHX73

## 2023-09-20 LAB — CBC
HCT: 30.3 % — ABNORMAL LOW (ref 39.0–52.0)
HCT: 24.8 % — ABNORMAL LOW (ref 39.0–52.0)
Hemoglobin: 9.2 g/dL — ABNORMAL LOW (ref 13.0–17.0)
Hemoglobin: 7.6 g/dL — ABNORMAL LOW (ref 13.0–17.0)
MCH: 26.7 pg (ref 26.0–34.0)
MCH: 26.5 pg (ref 26.0–34.0)
MCHC: 30.4 g/dL (ref 30.0–36.0)
MCHC: 30.6 g/dL (ref 30.0–36.0)
MCV: 87.8 fL (ref 80.0–100.0)
MCV: 86.4 fL (ref 80.0–100.0)
Platelets: 310 K/uL (ref 150–400)
Platelets: 274 K/uL (ref 150–400)
RBC: 3.45 MIL/uL — ABNORMAL LOW (ref 4.22–5.81)
RBC: 2.87 MIL/uL — ABNORMAL LOW (ref 4.22–5.81)
RDW: 19.4 % — ABNORMAL HIGH (ref 11.5–15.5)
RDW: 19.1 % — ABNORMAL HIGH (ref 11.5–15.5)
WBC: 19.5 K/uL — ABNORMAL HIGH (ref 4.0–10.5)
WBC: 16 K/uL — ABNORMAL HIGH (ref 4.0–10.5)
nRBC: 0 % (ref 0.0–0.2)
nRBC: 0 % (ref 0.0–0.2)

## 2023-09-20 LAB — CBC WITH DIFFERENTIAL/PLATELET
Abs Immature Granulocytes: 0.77 K/uL — ABNORMAL HIGH (ref 0.00–0.07)
Basophils Absolute: 0.1 K/uL (ref 0.0–0.1)
Basophils Relative: 0 %
Eosinophils Absolute: 0.2 K/uL (ref 0.0–0.5)
Eosinophils Relative: 1 %
HCT: 22.1 % — ABNORMAL LOW (ref 39.0–52.0)
Hemoglobin: 6.6 g/dL — CL (ref 13.0–17.0)
Immature Granulocytes: 5 %
Lymphocytes Relative: 8 %
Lymphs Abs: 1.3 K/uL (ref 0.7–4.0)
MCH: 26.4 pg (ref 26.0–34.0)
MCHC: 29.9 g/dL — ABNORMAL LOW (ref 30.0–36.0)
MCV: 88.4 fL (ref 80.0–100.0)
Monocytes Absolute: 1.1 K/uL — ABNORMAL HIGH (ref 0.1–1.0)
Monocytes Relative: 7 %
Neutro Abs: 12.8 K/uL — ABNORMAL HIGH (ref 1.7–7.7)
Neutrophils Relative %: 79 %
Platelets: 253 K/uL (ref 150–400)
RBC: 2.5 MIL/uL — ABNORMAL LOW (ref 4.22–5.81)
RDW: 17.7 % — ABNORMAL HIGH (ref 11.5–15.5)
WBC: 16.2 K/uL — ABNORMAL HIGH (ref 4.0–10.5)
nRBC: 0.2 % (ref 0.0–0.2)

## 2023-09-20 LAB — BASIC METABOLIC PANEL WITH GFR
Anion gap: 7 (ref 5–15)
BUN: 34 mg/dL — ABNORMAL HIGH (ref 8–23)
CO2: 27 mmol/L (ref 22–32)
Calcium: 9 mg/dL (ref 8.9–10.3)
Chloride: 112 mmol/L — ABNORMAL HIGH (ref 98–111)
Creatinine, Ser: 1.18 mg/dL (ref 0.61–1.24)
GFR, Estimated: >60 mL/min (ref >60)
Glucose, Bld: 92 mg/dL (ref 70–99)
Potassium: 4.9 mmol/L (ref 3.5–5.1)
Sodium: 146 mmol/L — ABNORMAL HIGH (ref 135–145)

## 2023-09-20 LAB — BPAM RBC
Blood Product Expiration Date: 202509202359
ISSUE DATE / TIME: 202509161142
Unit Type and Rh: 1700

## 2023-09-20 LAB — TYPE AND SCREEN
ABO/RH(D): B POS
Antibody Screen: NEGATIVE
Unit division: 0

## 2023-09-20 LAB — PREPARE RBC (CROSSMATCH)

## 2023-09-20 SURGERY — TURP (TRANSURETHRAL RESECTION OF PROSTATE)
Anesthesia: General | Site: Prostate

## 2023-09-20 MED ORDER — SODIUM CHLORIDE 0.9 % IR SOLN
Status: DC | PRN
  Administered 2023-09-20 (×3): 3000 mL

## 2023-09-20 MED ORDER — LIDOCAINE HCL (PF) 2 % IJ SOLN
INTRAMUSCULAR | Status: DC | PRN
  Administered 2023-09-20: 100 mg via INTRADERMAL

## 2023-09-20 MED ORDER — STERILE WATER FOR INJECTION IJ SOLN
INTRAMUSCULAR | Status: AC
  Filled 2023-09-20: qty 30

## 2023-09-20 MED ORDER — SODIUM CHLORIDE 0.9% FLUSH: 10.0000 mL | INTRAVENOUS | Status: DC | PRN

## 2023-09-20 MED ORDER — FENTANYL CITRATE (PF) 100 MCG/2ML IJ SOLN
INTRAMUSCULAR | Status: AC
  Filled 2023-09-20: qty 2

## 2023-09-20 MED ORDER — ONDANSETRON HCL 4 MG/2ML IJ SOLN
INTRAMUSCULAR | Status: AC
  Filled 2023-09-20: qty 2

## 2023-09-20 MED ORDER — STERILE WATER FOR IRRIGATION IR SOLN
Status: DC | PRN
  Administered 2023-09-20: 30 mL

## 2023-09-20 MED ORDER — PROPOFOL 10 MG/ML IV BOLUS
INTRAVENOUS | Status: DC | PRN
  Administered 2023-09-20: 140 mg via INTRAVENOUS
  Administered 2023-09-20: 30 mg via INTRAVENOUS

## 2023-09-20 MED ORDER — PHENYLEPHRINE 80 MCG/ML (10ML) SYRINGE FOR IV PUSH (FOR BLOOD PRESSURE SUPPORT)
PREFILLED_SYRINGE | INTRAVENOUS | Status: DC | PRN
  Administered 2023-09-20 (×2): 160 ug via INTRAVENOUS
  Administered 2023-09-20 (×4): 120 ug via INTRAVENOUS

## 2023-09-20 MED ORDER — FENTANYL CITRATE (PF) 100 MCG/2ML IJ SOLN
INTRAMUSCULAR | Status: DC | PRN
  Administered 2023-09-20 (×3): 50 ug via INTRAVENOUS

## 2023-09-20 MED ORDER — DEXAMETHASONE SODIUM PHOSPHATE 10 MG/ML IJ SOLN
INTRAMUSCULAR | Status: DC | PRN
  Administered 2023-09-20: 8 mg via INTRAVENOUS

## 2023-09-20 MED ORDER — LACTATED RINGERS IV SOLN: INTRAVENOUS | Status: DC | PRN

## 2023-09-20 MED ORDER — LIDOCAINE HCL (PF) 2 % IJ SOLN
INTRAMUSCULAR | Status: AC
  Filled 2023-09-20: qty 5

## 2023-09-20 MED ORDER — PROPOFOL 10 MG/ML IV BOLUS
INTRAVENOUS | Status: AC
  Filled 2023-09-20: qty 20

## 2023-09-20 MED ORDER — SODIUM CHLORIDE 0.9 % IR SOLN
Status: DC | PRN
  Administered 2023-09-20 (×2): 3000 mL

## 2023-09-20 MED ORDER — DEXAMETHASONE SODIUM PHOSPHATE 10 MG/ML IJ SOLN
INTRAMUSCULAR | Status: AC
  Filled 2023-09-20: qty 1

## 2023-09-20 MED ORDER — SODIUM CHLORIDE 0.9% IV SOLUTION: Freq: Once | INTRAVENOUS | Status: AC

## 2023-09-20 MED ORDER — ONDANSETRON HCL 4 MG/2ML IJ SOLN
INTRAMUSCULAR | Status: DC | PRN
  Administered 2023-09-20: 4 mg via INTRAVENOUS

## 2023-09-20 SURGICAL SUPPLY — 19 items
BAG URINE DRAIN 2000ML AR STRL (UROLOGICAL SUPPLIES) ×1 IMPLANT
BAG URO CATCHER STRL LF (MISCELLANEOUS) ×1 IMPLANT
CATH FOLEY 3WAY 30CC 22FR (CATHETERS) IMPLANT
CATH URTH STD 24FR FL 3W 2 (CATHETERS) ×1 IMPLANT
CLOTH BEACON ORANGE TIMEOUT ST (SAFETY) ×1 IMPLANT
DRAPE FOOT SWITCH (DRAPES) ×1 IMPLANT
GLOVE BIO SURGEON STRL SZ7.5 (GLOVE) ×1 IMPLANT
GOWN STRL REUS W/ TWL XL LVL3 (GOWN DISPOSABLE) ×1 IMPLANT
HOLDER FOLEY CATH W/STRAP (MISCELLANEOUS) ×1 IMPLANT
KIT TURNOVER KIT A (KITS) ×1 IMPLANT
LOOP CUT BIPOLAR 24F LRG (ELECTROSURGICAL) ×1 IMPLANT
MANIFOLD NEPTUNE II (INSTRUMENTS) ×1 IMPLANT
PACK CYSTO (CUSTOM PROCEDURE TRAY) ×1 IMPLANT
PAD PREP 24X48 CUFFED NSTRL (MISCELLANEOUS) ×1 IMPLANT
SET IRRIG Y TYPE TUR BLADDER L (SET/KITS/TRAYS/PACK) IMPLANT
SYR 30ML LL (SYRINGE) ×1 IMPLANT
SYRINGE TOOMEY IRRIG 70ML (MISCELLANEOUS) ×1 IMPLANT
TUBING CONNECTING 10 (TUBING) ×1 IMPLANT
TUBING UROLOGY SET (TUBING) ×1 IMPLANT

## 2023-09-20 NOTE — Anesthesia Postprocedure Evaluation (Signed)
 Anesthesia Post Note  [name]  Procedure(s) Performed: TURP (TRANSURETHRAL RESECTION OF PROSTATE) (Prostate)     Patient location during evaluation: PACU Anesthesia Type: General Level of consciousness: awake and alert Pain management: pain level controlled Vital Signs Assessment: post-procedure vital signs reviewed and stable Respiratory status: spontaneous breathing, nonlabored ventilation, respiratory function stable and patient connected to nasal cannula oxygen Cardiovascular status: blood pressure returned to baseline and stable Postop Assessment: no apparent nausea or vomiting Anesthetic complications: no   No notable events documented.  Last Vitals:  Vitals:   09/20/23 1500 09/20/23 1506  BP: 130/80 (!) 128/90  Pulse: 95 92  Resp: (!) 25 18  Temp: 36.8 C 36.7 C  SpO2: 94% 97%    Last Pain:  Vitals:   09/20/23 1506  TempSrc: Oral  PainSc: Asleep                 Garnette DELENA Gab

## 2023-09-20 NOTE — Progress Notes (Signed)
 PROGRESS NOTE   Nathan Shepherd  FMW:[id number]    DOB: 11-07-1955    DOA: 09/18/2023  PCP: Gammon, Chrystal, NP   I have briefly reviewed patients previous medical records in [facility].   Brief Hospital Course:  68 y.o. male, has a legal guardian, with PMH significant for HTN, HLD, CHF, CKD, chronic A-fib on Coumadin , CVA, prior substance abuse. Recently hospitalized twice at [facility].   9/1-9/4 for severe hemorrhoidal bleeding s/p hemorrhoidectomy and discharged back to group home. 9/5 to 9/11 for E. coli UTI, generalized weakness and discharged to SNF. 9/14, presented to [facility] after significant amount of blood loss was noted in his diapers.   In the ED, he was hypotensive. Hemoglobin dropped from 9.6 to 6.7, INR 4.87  CTA showed active GI bleeding in the rectum and extensive inflammatory change surrounding the rectum with rim-enhancing fluid collection involving the right aspect of the prostate that may reflect abscess.  Right IJ central line was placed emergently. Patient was given 2 units PRBCs, Kcentra, vitamin K , IV fluid. Started on broad-spectrum IV antibiotics for possible sepsis. Was transferred from [facility] to [facility] ICU.    While in ICU, blood pressure improved, no further bleeding was noticed.  Patient did not require pressors. 9/16, transferred out to [facility].  On 9/16, transferred from [facility] to [facility] for evaluation by urology and transurethral resection/unroofing of prostatic abscess.   Assessment & Plan:   Hemorrhagic shock Acute on chronic blood loss anemia Secondary to recent hemorrhoidectomy in the context of supratherapeutic INR Required ICU admission, close monitoring, IV fluid resuscitation and shock resolved.  Did not require vasopressors Unclear how many units PRBC he was transfused thus far this admission.  He presented with hemoglobin of 7.5 (11.2 on 9/9) which dropped to 6.8.  Despite transfusions, hemoglobin at 6.6  today.  Will transfuse an additional unit of PRBC and follow posttransfusion CBCs with aim to keep hemoglobin >7. Per nursing, streaks of blood per rectum and no report of large-volume rectal bleeding.  Discussed extensively with nursing to monitor and document volume of rectal bleeding. Also as per discussion with patient's RN, patient has poor IV access.  Given his critical illness, need for upcoming surgery, likely need for further labs/IV meds/transfusions etc., PICC line is indicated.  It appears that patient himself is unable to make medical decisions and has court appointed legal guardian (Ms. Dorn Gelineau) and discussed with legal guardian along with patient's RN and outpatient social worker on the same call.  Ms. Gelineau consented to blood transfusions and PICC line placement.  Recurrent rectal bleeding Severe hemorrhoidal bleeding s/p hemorrhoidectomy 09/05/2023 Initial CTA showed active bleeding in the rectum. However clinically, bleeding seems to have slowed down. As per general surgery input from 9/16, his rectal bleeding was due to supratherapeutic INR (4.87 this admission).  They removed the hemorrhoidal tissue and felt that the anticoagulation at this point was making him more prone to bleeding.   S/p INR reversal with Kcentra and vitamin K  10 mg IV x 1.  Coumadin  currently on hold and will need to reassess risk versus benefit prior to resuming.  Prostate abscess CT pelvis suggested prostate abscess. Dr. Bell/urology consulted and patient was transferred from [facility] to [facility] for surgery Plan is for transurethral resection/unroofing of prostatic abscess. Continue empirically started IV ciprofloxacin  and metronidazole .  Chronic A-fib Rate controlled.  Carvedilol  on hold due to recent hemorrhagic shock.  Consider resuming at low-dose soon. Anticoagulation  reversed as noted above.  INR 9/15: 1.8. Currently holding warfarin.  See discussion above regarding resumption  Chronic  hronic diastolic CHF HTN Echo 9/15 with EF 55 to 60%, no WMA, moderately dilated LA Blood pressure is well-controlled. PTA meds- Coreg , torsemide  Currently both on hold   CAD, HLD Resume statins when able.   CKD 3A Creatinine at baseline which is not clearly known but may be in the 1.2-1.4 range.  Creatinine down to 1.18 today.  Seizure disorder PTA meds- Keppra , Depakote  Continue both.  Prior [facility] MD had asked pharmacy to review the dose of Depakote  outpatient and inpatient   H/o polysubstance abuse Reports no recent abuse   Dementia Continue Aricept   Mild hypernatremia Consider changing IVF to half-normal saline from normal saline.  Follow BMP closely.  Body mass index is 22.78 kg/m.   DVT prophylaxis: SCD's Start: 09/20/23 0041 SCDs Start: 09/18/23 0102     Code Status: Full Code:  Family Communication: Discussed in detail on a conference call with patient's RN, outpatient legal guardian (Ms. Dorn Gelineau) and OP social worker (Ms. Nonda).  Updated care and answered all questions. Disposition:  Status is: Inpatient Remains inpatient appropriate because: Acute blood loss anemia needing further blood transfusions and close monitoring, prostate abscess awaiting surgery, IV antibiotics.     Consultants:   General surgery Urology  Procedures:     Subjective:  Seen this morning along with patient's 2 RNs at bedside.  Patient slightly groggy but arousable.  Oriented to self and partly to place, [facility].  Denies complaints.  Specifically denied pain or bleeding that he was aware of.  As per nursing, had streaks of rectal bleeding today.  Objective:   Vitals:   09/20/23 0843 09/20/23 1035 09/20/23 1053 09/20/23 1211  BP: 98/70 118/76 121/64 129/86  Pulse: 83 82 78 76  Resp: 18 18 20 17   Temp: 97.8 F (36.6 C) 98.4 F (36.9 C) 97.8 F (36.6 C) 98.1 F (36.7 C)  TempSrc: Oral Axillary  Oral  SpO2: 100% 100%  100%  Weight:      Height:         General exam: Middle-age male, moderately built and chronically ill looking lying comfortably propped up in bed without distress. Respiratory system: Clear to auscultation. Respiratory effort normal. Cardiovascular system: S1 & S2 heard, RRR. No JVD, murmurs, rubs, gallops or clicks. No pedal edema.  Telemetry personally reviewed: A-fib, BBB morphology, controlled ventricular rate. Gastrointestinal system: Abdomen is nondistended, soft and nontender. No organomegaly or masses felt. Normal bowel sounds heard. Central nervous system: Alert and oriented x 2 as noted above. No focal neurological deficits. Extremities: Symmetric 5 x 5 power. Skin: No rashes, lesions or ulcers Psychiatry: Judgement and insight impaired. Mood & affect flat.    Data Reviewed:   I have personally reviewed following labs and imaging studies   CBC: Recent Labs  Lab 09/18/23 1220 09/19/23 0918 09/20/23 0357  WBC 13.6* 14.8* 16.2*  NEUTROABS  --   --  12.8*  HGB 7.0* 6.8* 6.6*  HCT 21.9* 21.2* 22.1*  MCV 84.6 85.5 88.4  PLT 169 234 253    Basic Metabolic Panel: Recent Labs  Lab 09/18/23 0140 09/19/23 0918 09/20/23 0357  NA 143  142 146* 146*  K 4.1  4.1 4.8 4.9  CL 109  109 109 112*  CO2 28  29 26 27   GLUCOSE 117*  116* 113* 92  BUN 36*  37* 40* 34*  CREATININE 1.39*  1.41* 1.40* 1.18  CALCIUM  7.9*  8.0* 8.9 9.0  MG 1.6*  --   --   PHOS 2.9  --   --     Liver Function Tests: Recent Labs  Lab 09/18/23 0140  AST 17  ALT 13  ALKPHOS 40  BILITOT 1.1  PROT 4.6*  ALBUMIN <1.5*    CBG: Recent Labs  Lab 09/18/23 0057  GLUCAP 108*    Microbiology Studies:   Recent Results (from the past 240 hours)  Culture, blood (Routine X 2) w Reflex to ID Panel     Status: None   Collection Time: 09/11/23  6:03 PM   Specimen: BLOOD  Result Value Ref Range Status   Specimen Description BLOOD LEFT ANTECUBITAL  Final   Special Requests AEROBIC BOTTLE ONLY Blood Culture adequate  volume  Final   Culture   Final    NO GROWTH 5 DAYS Performed at [facility], [address] [place], [place] [address]    Report Status 09/16/2023 FINAL  Final  Culture, blood (Routine X 2) w Reflex to ID Panel     Status: None   Collection Time: 09/11/23  6:03 PM   Specimen: BLOOD  Result Value Ref Range Status   Specimen Description BLOOD BLOOD LEFT WRIST  Final   Special Requests AEROBIC BOTTLE ONLY Blood Culture adequate volume  Final   Culture   Final    NO GROWTH 5 DAYS Performed at [facility], [address], [place], [place] [address]    Report Status 09/16/2023 FINAL  Final  MRSA Next Gen by PCR, Nasal     Status: None   Collection Time: 09/18/23  1:54 AM   Specimen: Nasal Mucosa; Nasal Swab  Result Value Ref Range Status   MRSA by PCR Next Gen NOT DETECTED NOT DETECTED Final    Comment: (NOTE) The GeneXpert MRSA Assay (FDA approved for NASAL specimens only), is one component of a comprehensive MRSA colonization surveillance program. It is not intended to diagnose MRSA infection nor to guide or monitor treatment for MRSA infections. Test performance is not FDA approved in patients less than 67 years old. Performed at [facility], [address] [place], [place] [address]   Culture, blood (Routine X 2) w Reflex to ID Panel     Status: None (Preliminary result)   Collection Time: 09/18/23  2:22 AM   Specimen: BLOOD LEFT HAND  Result Value Ref Range Status   Specimen Description BLOOD LEFT HAND  Final   Special Requests   Final    BOTTLES DRAWN AEROBIC ONLY Blood Culture results may not be optimal due to an inadequate volume of blood received in culture bottles   Culture   Final    NO GROWTH 2 DAYS Performed at [facility], [address], [place], [place] [address]    Report Status PENDING  Incomplete  Culture, blood (Routine X 2) w Reflex to ID Panel     Status: None (Preliminary result)   Collection Time: 09/18/23  2:22 AM   Specimen: BLOOD LEFT  HAND  Result Value Ref Range Status   Specimen Description BLOOD LEFT HAND  Final   Special Requests   Final    BOTTLES DRAWN AEROBIC ONLY Blood Culture results may not be optimal due to an inadequate volume of blood received in culture bottles   Culture   Final    NO GROWTH 2 DAYS Performed at [facility], [address]  [place], [place] [address]    Report Status PENDING  Incomplete    Radiology Studies:  US  EKG SITE RITE Result Date: 09/20/2023 If Site Rite image not attached, placement could not be confirmed due to current cardiac rhythm.  DG CHEST PORT 1 VIEW Result Date: 09/20/2023 CLINICAL DATA:  Preoperative respiratory evaluation. EXAM: PORTABLE CHEST 1 VIEW COMPARISON:  09/17/2023 FINDINGS: Low volume film. The cardio pericardial silhouette is enlarged. Mild asymmetric elevation left hemidiaphragm. Right IJ central line seen previously has been removed in the interval. Prominent skin fold noted over the right upper lobe. The lungs are clear without focal pneumonia, edema, pneumothorax or pleural effusion. Telemetry leads overlie the chest. IMPRESSION: Low volume film without acute cardiopulmonary findings. Electronically Signed   By: Camellia Candle M.D.   On: 09/20/2023 07:47    Scheduled Meds:    [FJM Hold] sodium chloride    Intravenous Once   [MAR Hold] Chlorhexidine  Gluconate Cloth  6 each Topical Daily   [MAR Hold] dextrose   25 g Intravenous Once   [MAR Hold] divalproex   750 mg Oral Q12H   [MAR Hold] donepezil   5 mg Oral QHS   [MAR Hold] feeding supplement  237 mL Oral TID BM   [MAR Hold] levETIRAcetam   1,000 mg Oral BID   [MAR Hold] multivitamin with minerals  1 tablet Oral Daily   [MAR Hold] pantoprazole   40 mg Oral Daily    Continuous Infusions:    sodium chloride  75 mL/hr at 09/20/23 0130   [MAR Hold] ciprofloxacin  400 mg (09/20/23 0846)   [MAR Hold] metronidazole  500 mg (09/19/23 2129)     LOS: 2 days     Trenda Mar, MD,  FACP, [facility], [facility],  [facility]   [facility] [place]      To contact the attending provider between 7A-7P or the covering provider during after hours 7P-7A, please log into the web site [email or web address] and access using universal [place] password for that web site. If you do not have the password, please call the hospital operator.  09/20/2023, 1:10 PM

## 2023-09-20 NOTE — Progress Notes (Signed)
 Contact attempted to [employer] (legal guardian) to obtain consent for TURP procedure, VM left at [phone].

## 2023-09-20 NOTE — Progress Notes (Signed)
 Peripherally Inserted Central Catheter Placement  The IV Nurse has discussed with the patient and/or persons authorized to consent for the patient, the purpose of this procedure and the potential benefits and risks involved with this procedure.  The benefits include less needle sticks, lab draws from the catheter, and the patient may be discharged home with the catheter. Risks include, but not limited to, infection, bleeding, blood clot (thrombus formation), and puncture of an artery; nerve damage and irregular heartbeat and possibility to perform a PICC exchange if needed/ordered by physician.  Alternatives to this procedure were also discussed.  Bard Power PICC patient education guide, fact sheet on infection prevention and patient information card has been provided to patient /or left at bedside. Awaiting PCXR results to confirm tip placement before usage.   PICC Placement Documentation  PICC Double Lumen 09/20/23 Left Brachial 50 cm 0 cm (Active)  Indication for Insertion or Continuance of Line Poor Vasculature-patient has had multiple peripheral attempts or PIVs lasting less than 24 hours;Prolonged intravenous therapies 09/20/23 2000  Exposed Catheter (cm) 0 cm 09/20/23 2000  Site Assessment Clean, Dry, Intact;Edematous;Ecchymotic 09/20/23 2000  Lumen #1 Status Blood return noted;Flushed;Saline locked 09/20/23 2000  Lumen #2 Status Blood return noted;Flushed;Saline locked 09/20/23 2000  Dressing Type Transparent;Securing device 09/20/23 2000  Dressing Status Antimicrobial disc/dressing in place;Clean, Dry, Intact 09/20/23 2000  Line Care Connections checked and tightened 09/20/23 2000  Line Adjustment (NICU/IV Team Only) No 09/20/23 2000  Dressing Intervention New dressing;Adhesive placed at insertion site (IV team only) 09/20/23 2000  Dressing Change Due 09/27/23 09/20/23 2000       Debby Salomon CROME 09/20/2023, 8:14 PM

## 2023-09-20 NOTE — Transfer of Care (Signed)
 Immediate Anesthesia Transfer of Care Note  [name]  Procedure(s) Performed: TURP (TRANSURETHRAL RESECTION OF PROSTATE)  Patient Location: PACU  Anesthesia Type:General  Level of Consciousness: awake and alert   Airway & Oxygen Therapy: Patient Spontanous Breathing  Post-op Assessment: Report given to RN  Post vital signs: Reviewed and stable  Last Vitals:  Vitals Value Taken Time  BP 119/104 09/20/23 14:20  Temp    Pulse 86 09/20/23 14:29  Resp 18 09/20/23 14:29  SpO2 96 % 09/20/23 14:29  Vitals shown include unfiled device data.  Last Pain:  Vitals:   09/20/23 1211  TempSrc: Oral  PainSc:          Complications: No notable events documented.

## 2023-09-20 NOTE — Anesthesia Procedure Notes (Signed)
 Procedure Name: LMA Insertion Date/Time: 09/20/2023 1:27 PM  Performed by: Delores Duwaine SAUNDERS, CRNAPre-anesthesia Checklist: Patient identified, Emergency Drugs available, Suction available and Patient being monitored Patient Re-evaluated:Patient Re-evaluated prior to induction Oxygen Delivery Method: Circle System Utilized Preoxygenation: Pre-oxygenation with 100% oxygen Induction Type: IV induction Ventilation: Mask ventilation without difficulty LMA: LMA inserted LMA Size: 5.0 Number of attempts: 1 Airway Equipment and Method: Bite block Placement Confirmation: positive ETCO2 Tube secured with: Tape Dental Injury: Teeth and Oropharynx as per pre-operative assessment

## 2023-09-20 NOTE — Interval H&P Note (Signed)
 History and Physical Interval Note:  09/20/2023 12:32 PM  Nathan Shepherd  has presented today for surgery, with the diagnosis of prostate abscess.  The various methods of treatment have been discussed with the patient and family. After consideration of risks, benefits and other options for treatment, the patient has consented to  Procedure(s): TURP (TRANSURETHRAL RESECTION OF PROSTATE) (N/A) as a surgical intervention.  The patient's history has been reviewed, patient examined, no change in status, stable for surgery.  I have reviewed the patient's chart and labs.  Questions were answered to the patient's satisfaction.     Sherwood JONETTA Edison, III

## 2023-09-20 NOTE — Progress Notes (Signed)
 OT Cancellation Note  Patient Details Name: Nathan Shepherd MRN: [id number] DOB: 11-06-1955   Cancelled Treatment:    Reason Eval/Treat Not Completed: Medical issues which prohibited therapy  Patient scheduled for OR procedure this day. Will continue to follow acutely for OT evaluation once patient medically ready.   Sebastyan Snodgrass OT/L Acute Rehabilitation Department  [phone]   09/20/2023, 8:53 AM

## 2023-09-20 NOTE — Anesthesia Preprocedure Evaluation (Addendum)
 Anesthesia Evaluation  Patient identified by MRN, date of birth, ID band Patient awake    Reviewed: Allergy & Precautions, NPO status , Patient's Chart, lab work & pertinent test results  History of Anesthesia Complications (+) DIFFICULT AIRWAY and history of anesthetic complications  Airway Mallampati: IV  TM Distance: >3 FB Neck ROM: Full    Dental no notable dental hx. (+) Poor Dentition   Pulmonary COPD,  COPD inhaler   Pulmonary exam normal breath sounds clear to auscultation       Cardiovascular hypertension, Pt. on medications and Pt. on home beta blockers Normal cardiovascular exam+ dysrhythmias (on Coumadin ) Atrial Fibrillation  Rhythm:Regular Rate:Normal  09/18/2023 ECHO: EF 55 to 60%.  1. The LV has normal function, no regional wall motion abnormalities. There is mild LVH  2. RVF is mildly reduced. The right ventricular size is normal.   3. Left atrial size was moderately dilated.   4. Right atrial size was mildly dilated.   5. The mitral valve is normal in structure. No evidence of mitral valve regurgitation. No evidence of mitral stenosis.   6. The aortic valve is normal in structure. There is mild calcification of the aortic valve. Aortic valve regurgitation is trivial. No aortic stenosis is present.     Neuro/Psych Seizures - (Keppra ),  CVA    GI/Hepatic ,GERD  Medicated,,Recent hemorrhoid surgery: persistent bleeding (Coumadin )   Endo/Other    Renal/GU Renal InsufficiencyRenal disease   Prostate abscess    Musculoskeletal   Abdominal   Peds  Hematology  (+) Blood dyscrasia (Hb 6.6, plt 253: transfusing), anemia Coumadin  INR 1.8   09/18/2023   Anesthesia Other Findings   Reproductive/Obstetrics                              Anesthesia Physical Anesthesia Plan  ASA: 3  Anesthesia Plan: General   Post-op Pain Management: Precedex  and Ofirmev  IV (intra-op)*    Induction: Intravenous  PONV Risk Score and Plan: 3 and Treatment may vary due to age or medical condition, Ondansetron  and Midazolam   Airway Management Planned: LMA  Additional Equipment: None  Intra-op Plan:   Post-operative Plan: Extubation in OR  Informed Consent: I have reviewed the patients History and Physical, chart, labs and discussed the procedure including the risks, benefits and alternatives for the proposed anesthesia with the patient or authorized representative who has indicated his/her understanding and acceptance.     Dental advisory given  Plan Discussed with: CRNA and Surgeon  Anesthesia Plan Comments: (Discussed with Dr. Carolee, will proceed once transfusion in progress)         Anesthesia Quick Evaluation

## 2023-09-20 NOTE — Op Note (Signed)
 Operative Note  Preoperative diagnosis:  1.  Prostate abscess  Postoperative diagnosis: 1.  Prostate abscess  Procedure(s): 1.  Transurethral resection of the prostate/unroofing of prostatic abscess  Surgeon: Sherwood Edison, MD  Assistants: None  Anesthesia: General  Complications: None immediate  EBL: Minimal  Specimens: 1.  Prostate chips 2.  Prostate abscess fluid for culture obtained through cystoscope  Drains/Catheters: 1.  22 French three-way Foley catheter  Intraoperative findings: 1.  Normal anterior urethra 2.  Obstructing prostate with bilobar hypertrophy.  I resected the right side of prostate there was a large abscess cavity with copious amounts of pus that was evacuated.  Bladder mucosa with some edema but no obvious tumors or stones.  Resection was away from the ureteral orifices.  Indication: 68 year old male with a prostate abscess presents for the previously mentioned operation  Description of procedure:  The patient was identified and consent was obtained.  The patient was taken to the operating room and placed in the supine position.  The patient was placed under general anesthesia.  Perioperative antibiotics were administered.  The patient was placed in dorsal lithotomy.  Patient was prepped and draped in a standard sterile fashion and a timeout was performed.  A 26 French resectoscope with the visual obturator in place was advanced into the urethra and into the bladder.  Complete cystoscopy was performed with the findings noted above.  I exchanged for the bipolar working element.  I resected from the bladder neck down to the mid prostate on the right and encountered a large abscess cavity with copious amounts of pus that was evacuated.  I drew some of this fluid out through the scope and sent it for culture.  I opened up the cavity further to adequately drain it.  Once it was completely drained, obtained hemostasis with spot electrocautery.  There is no  significant active bleeding noted.  I collected the prostate chips for specimen.  I then withdrew the scope and placed a 22 French three-way catheter.  We initiated continuous bladder irrigation.  This concluded the operation.  Patient tolerated the procedure well was stable postoperatively.  Plan: Follow-up on cultures.  He will need to keep the catheter for at least 10 days to allow for prostatic healing

## 2023-09-20 NOTE — Progress Notes (Signed)
 Contacted social worker/legal guardian Ula Bare and [place], [place] for consent for blood and PICC line due to limited IV access. MD [facility] and Charge RN Gustavo on call and second verified conversation.  Bell MD called to verify with Vonzell Kand about surgical procedure for today.

## 2023-09-20 NOTE — TOC Progression Note (Signed)
 Transition of Care [facility]) - Progression Note   Patient Details  Name: Nathan Shepherd MRN: [id number] Date of Birth: 1955/07/04  Transition of Care [facility]) CM/SW Contact  [name] [name], [name] Phone Number: 09/20/2023, 2:35 PM  Clinical Narrative: CSW spoke with patient's legal guardian, Nathan Shepherd, as patient transferred from [facility] to [employer]. Ms. Shepherd confirmed the patient will return to [facility] when medically ready to resume rehab. CSW requested copy of legal guardian paperwork as it is not scanned into the chart and is not on the shadow chart. Ms. Shepherd reported she would fax a copy of the legal guardian paperwork. CSW confirmed with Isaiah in admissions at [facility] that patient can return when medically ready. Care management to follow.  Expected Discharge Plan: Skilled Nursing Facility Barriers to Discharge: Continued Medical Work up, SNF Pending bed offer  Expected Discharge Plan and Services In-house Referral: Clinical Social Work Living arrangements for the past 2 months: Assisted Living Facility, Marketing executive, Skilled Nursing Facility  Social Drivers of Health (SDOH) Interventions SDOH Screenings   Food Insecurity: No Food Insecurity (09/19/2023)  Housing: Low Risk  (09/19/2023)  Transportation Needs: No Transportation Needs (09/19/2023)  Utilities: Not At Risk (09/19/2023)  Social Connections: Socially Isolated (09/19/2023)  Tobacco Use: Low Risk  (09/19/2023)   Readmission Risk Interventions    09/12/2023    7:53 AM  Readmission Risk Prevention Plan  Transportation Screening Complete  HRI or Home Care Consult Complete  Social Work Consult for Recovery Care Planning/Counseling Complete  Palliative Care Screening Not Applicable  Medication Review Oceanographer) Complete

## 2023-09-21 ENCOUNTER — Encounter (HOSPITAL_COMMUNITY): Payer: Self-pay | Admitting: Urology

## 2023-09-21 DIAGNOSIS — N412 Abscess of prostate: Secondary | ICD-10-CM | POA: Diagnosis not present

## 2023-09-21 DIAGNOSIS — E875 Hyperkalemia: Secondary | ICD-10-CM

## 2023-09-21 DIAGNOSIS — D62 Acute posthemorrhagic anemia: Secondary | ICD-10-CM | POA: Diagnosis not present

## 2023-09-21 LAB — HEMOGLOBIN AND HEMATOCRIT, BLOOD
HCT: 23.9 % — ABNORMAL LOW (ref 39.0–52.0)
Hemoglobin: 7.4 g/dL — ABNORMAL LOW (ref 13.0–17.0)

## 2023-09-21 LAB — CBC WITH DIFFERENTIAL/PLATELET
Abs Immature Granulocytes: 0.21 K/uL — ABNORMAL HIGH (ref 0.00–0.07)
Basophils Absolute: 0 K/uL (ref 0.0–0.1)
Basophils Relative: 0 %
Eosinophils Absolute: 0 K/uL (ref 0.0–0.5)
Eosinophils Relative: 0 %
HCT: 23.1 % — ABNORMAL LOW (ref 39.0–52.0)
Hemoglobin: 7.1 g/dL — ABNORMAL LOW (ref 13.0–17.0)
Immature Granulocytes: 2 %
Lymphocytes Relative: 8 %
Lymphs Abs: 1 K/uL (ref 0.7–4.0)
MCH: 27 pg (ref 26.0–34.0)
MCHC: 30.7 g/dL (ref 30.0–36.0)
MCV: 87.8 fL (ref 80.0–100.0)
Monocytes Absolute: 1 K/uL (ref 0.1–1.0)
Monocytes Relative: 7 %
Neutro Abs: 11 K/uL — ABNORMAL HIGH (ref 1.7–7.7)
Neutrophils Relative %: 83 %
Platelets: 215 K/uL (ref 150–400)
RBC: 2.63 MIL/uL — ABNORMAL LOW (ref 4.22–5.81)
RDW: 19 % — ABNORMAL HIGH (ref 11.5–15.5)
Smear Review: NORMAL
WBC: 13.2 K/uL — ABNORMAL HIGH (ref 4.0–10.5)
nRBC: 0.2 % (ref 0.0–0.2)

## 2023-09-21 LAB — BASIC METABOLIC PANEL WITH GFR
Anion gap: 4 — ABNORMAL LOW (ref 5–15)
BUN: 27 mg/dL — ABNORMAL HIGH (ref 8–23)
CO2: 27 mmol/L (ref 22–32)
Calcium: 8.8 mg/dL — ABNORMAL LOW (ref 8.9–10.3)
Chloride: 110 mmol/L (ref 98–111)
Creatinine, Ser: 1.11 mg/dL (ref 0.61–1.24)
GFR, Estimated: >60 mL/min (ref >60)
Glucose, Bld: 95 mg/dL (ref 70–99)
Potassium: 5.3 mmol/L — ABNORMAL HIGH (ref 3.5–5.1)
Sodium: 141 mmol/L (ref 135–145)

## 2023-09-21 LAB — PREPARE RBC (CROSSMATCH)

## 2023-09-21 LAB — SURGICAL PATHOLOGY

## 2023-09-21 LAB — PROTIME-INR
INR: 1.3 — ABNORMAL HIGH (ref 0.8–1.2)
Prothrombin Time: 16.9 s — ABNORMAL HIGH (ref 11.4–15.2)

## 2023-09-21 MED ORDER — SODIUM ZIRCONIUM CYCLOSILICATE 10 G PO PACK
10.0000 g | PACK | Freq: Two times a day (BID) | ORAL | Status: AC
  Administered 2023-09-21 (×2): 10 g via ORAL
  Filled 2023-09-21 (×2): qty 1

## 2023-09-21 MED ORDER — ACETAMINOPHEN 325 MG PO TABS
650.0000 mg | ORAL_TABLET | Freq: Four times a day (QID) | ORAL | Status: DC | PRN
  Administered 2023-09-23 – 2023-09-24 (×3): 650 mg via ORAL
  Filled 2023-09-21 (×3): qty 2

## 2023-09-21 MED ORDER — SODIUM CHLORIDE 0.9% IV SOLUTION: Freq: Once | INTRAVENOUS | Status: AC

## 2023-09-21 NOTE — TOC Progression Note (Addendum)
 Transition of Care [facility]) - Progression Note   Patient Details  Name: Nathan Shepherd MRN: [id number] Date of Birth: 03-08-1955  Transition of Care [facility]) CM/SW Contact  [name] [name], [name] Phone Number: 09/21/2023, 11:44 AM  Clinical Narrative: CSW confirmed with Isaiah in admissions that patient can be admitted to [facility] over the weekend if medically ready.  Guardianship paperwork placed on shadow chart for medical records.  Expected Discharge Plan: Skilled Nursing Facility Barriers to Discharge: Continued Medical Work up, SNF Pending bed offer  Expected Discharge Plan and Services In-house Referral: Clinical Social Work Living arrangements for the past 2 months: Assisted Living Facility, Marketing executive, Skilled Nursing Facility  Social Drivers of Health (SDOH) Interventions SDOH Screenings   Food Insecurity: No Food Insecurity (09/19/2023)  Housing: Low Risk  (09/19/2023)  Transportation Needs: No Transportation Needs (09/19/2023)  Utilities: Not At Risk (09/19/2023)  Social Connections: Socially Isolated (09/19/2023)  Tobacco Use: Low Risk  (09/19/2023)   Readmission Risk Interventions    09/12/2023    7:53 AM  Readmission Risk Prevention Plan  Transportation Screening Complete  HRI or Home Care Consult Complete  Social Work Consult for Recovery Care Planning/Counseling Complete  Palliative Care Screening Not Applicable  Medication Review Oceanographer) Complete

## 2023-09-21 NOTE — Evaluation (Signed)
 Occupational Therapy Evaluation Patient Details Name: Nathan Shepherd MRN: [id number] DOB: [birth year] Today's Date: 09/21/2023   History of Present Illness   68 y.o. male, has a legal guardian, with PMH significant for HTN, HLD, CHF, CKD, chronic A-fib on Coumadin , CVA, prior substance abuse. Recently hospitalized twice at [facility]. From SNF due to significant blood loss from an active GI bleed. On 9/16, transferred from [facility] to [facility] for evaluation by urology and transurethral resection/unroofing of prostatic abscess.  \     Clinical Impressions Pt was seen for OT evaluation this date. Prior to [facility] admission, pt was at [facility]. Pt was alert and oriented x2, reporting inconsistency regarding home set up and PLOF. Pt presents with deficits in decreased indep in self care, balance, functional mobility/transfers, activity tolerance, and safety awareness affecting safe and optimal ADL completion. Pt currently requires MAXA for all bed mobility, and LB dressing tasks. Pt declined further ADL participation on this date as well as OOB activity to progress functional mobility, despite verbal encouragement. Pt demonstrated UB and LE exercises while seated on the EOB, able to tolerate ~76mins of static sitting with MIN-CGA throughout. Pt required the support of BUE to maintain upright position. no reports of dizziness with position changes. Pt returned to supine to rest with all needs in reach. Pt would benefit from skilled OT services to address noted impairments and functional limitations (see below for any additional details) in order to maximize safety and independence while minimizing future risk of falls, injury, and readmission. OT will follow acutely.    If plan is discharge home, recommend the following:   Two people to help with walking and/or transfers;A lot of help with bathing/dressing/bathroom;Assistance with cooking/housework;Help with stairs or ramp for entrance;Assist for  transportation;Direct supervision/assist for medications management     Functional Status Assessment   Patient has had a recent decline in their functional status and demonstrates the ability to make significant improvements in function in a reasonable and predictable amount of time.     Equipment Recommendations   Other (comment) (Defer to next venue of care)     Recommendations for Other Services         Precautions/Restrictions   Precautions Precautions: Fall Recall of Precautions/Restrictions: Impaired Precaution/Restrictions Comments: watch BP Restrictions Weight Bearing Restrictions Per Provider Order: No     Mobility Bed Mobility Overal bed mobility: Needs Assistance Bed Mobility: Supine to Sit, Sit to Supine     Supine to sit: Max assist, HOB elevated Sit to supine: Max assist, Used rails   General bed mobility comments: Heavy cues for handplacement and sequencing throughout    Transfers Overall transfer level: Needs assistance                 General transfer comment: Pt refuses OOB activity on this date due to reported fatigue, dispite verbal encouragement      Balance Overall balance assessment: Needs assistance Sitting-balance support: Feet supported, Bilateral upper extremity supported Sitting balance-Leahy Scale: Poor Sitting balance - Comments: MIN-CGA for static sitting balance Postural control: Right lateral lean                                 ADL either performed or assessed with clinical judgement   ADL Overall ADL's : Needs assistance/impaired Eating/Feeding: Set up;Bed level                   Lower  Body Dressing: Maximal assistance;Sitting/lateral leans                 General ADL Comments: Anticipate MOD-MAXA for all ADL tasks , pt declined further ADL completion on this date                                Pertinent Vitals/Pain Pain Assessment Pain Assessment: No/denies pain      Extremity/Trunk Assessment Upper Extremity Assessment Upper Extremity Assessment: Generalized weakness   Lower Extremity Assessment Lower Extremity Assessment: Defer to PT evaluation;Generalized weakness   Cervical / Trunk Assessment Cervical / Trunk Assessment: Kyphotic   Communication Communication Communication: Impaired Factors Affecting Communication: Difficulty expressing self   Cognition Arousal: Alert Behavior During Therapy: WFL for tasks assessed/performed Cognition: Cognition impaired   Orientation impairments: Time, Situation Awareness: Intellectual awareness impaired Memory impairment (select all impairments): Short-term memory, Non-declarative long-term memory   Executive functioning impairment (select all impairments): Problem solving, Sequencing, Organization OT - Cognition Comments: Alert/oriented x2,inconsistent PLOF and home setup will need to confirm                 Following commands: Impaired Following commands impaired: Only follows one step commands consistently, Follows one step commands with increased time     Cueing  General Comments   Cueing Techniques: Verbal cues;Tactile cues;Gestural cues  small amount of bleeding noted on pt's chuck sheet   Exercises Exercises: Other exercises Other Exercises Other Exercises: Edu: Role of OT session, benefit of OOB activity, safe ADL completion        Home Living Family/patient expects to be discharged to:: Assisted living Living Arrangements: Group Home Available Help at Discharge: Available PRN/intermittently Type of Home: Group Home Home Access: Level entry     Home Layout: One level     Bathroom Shower/Tub: Walk-in shower         Home Equipment: None   Additional Comments: from SNF since last admission, pt reports lives in group home      Prior Functioning/Environment Prior Level of Function : Needs assist             Mobility Comments: prior to recent admissions  pt reports ambulatory at ALF no device ADLs Comments: Pt states someone at facility assist with all ADLs/IADLs    OT Problem List: Decreased strength;Decreased range of motion;Decreased activity tolerance;Impaired balance (sitting and/or standing);Decreased coordination;Decreased cognition;Pain   OT Treatment/Interventions: Self-care/ADL training;Therapeutic exercise;DME and/or AE instruction;Therapeutic activities;Cognitive remediation/compensation;Patient/family education;Balance training      OT Goals(Current goals can be found in the care plan section)   Acute Rehab OT Goals Patient Stated Goal: Get better OT Goal Formulation: With patient Time For Goal Achievement: 10/05/23 Potential to Achieve Goals: Good ADL Goals Pt Will Perform Grooming: sitting;with set-up Pt Will Perform Lower Body Dressing: sitting/lateral leans;with min assist Pt Will Transfer to Toilet: stand pivot transfer;with min assist Pt Will Perform Toileting - Clothing Manipulation and hygiene: with min assist;bed level   OT Frequency:  Min 2X/week    AM-PAC OT 6 Clicks Daily Activity     Outcome Measure Help from another person eating meals?: A Little Help from another person taking care of personal grooming?: A Lot Help from another person toileting, which includes using toliet, bedpan, or urinal?: A Lot Help from another person bathing (including washing, rinsing, drying)?: A Lot Help from another person to put on and taking off regular upper body clothing?: A Lot  Help from another person to put on and taking off regular lower body clothing?: A Lot 6 Click Score: 13   End of Session    Activity Tolerance: Patient tolerated treatment well Patient left: in bed;with call bell/phone within reach;with bed alarm set  OT Visit Diagnosis: Unsteadiness on feet (R26.81);Other abnormalities of gait and mobility (R26.89);Muscle weakness (generalized) (M62.81);History of falling (Z91.81);Other symptoms and signs  involving cognitive function                Time: 1330-1349 OT Time Calculation (min): 19 min Charges:  OT General Charges $OT Visit: 1 Visit OT Evaluation $OT Eval Moderate Complexity: 1 Mod OT Treatments $Self Care/Home Management : 8-22 mins  Larraine Colas M.S. OTR/L  09/21/23, 2:12 PM

## 2023-09-21 NOTE — Plan of Care (Signed)
  Problem: Clinical Measurements: Goal: Respiratory complications will improve Outcome: Progressing   Problem: Clinical Measurements: Goal: Cardiovascular complication will be avoided Outcome: Progressing   Problem: Activity: Goal: Risk for activity intolerance will decrease Outcome: Progressing   Problem: Nutrition: Goal: Adequate nutrition will be maintained Outcome: Progressing   

## 2023-09-21 NOTE — Progress Notes (Signed)
 Urology Inpatient Progress Report  Abscess [L02.91]  Procedure(s): TURP (TRANSURETHRAL RESECTION OF PROSTATE)  1 Day Post-Op   Intv/Subj: No acute events overnight. Patient is without complaint. Feels much better today. Urine clear off cbi  Principal Problem:   Abscess Active Problems:   ABLA (acute blood loss anemia)   Protein-calorie malnutrition, severe  Current Facility-Administered Medications  Medication Dose Route Frequency Provider Last Rate Last Admin   acetaminophen  (TYLENOL ) tablet 650 mg  650 mg Oral Q6H PRN Hongalgi, Anand D, MD       calcium  carbonate (TUMS - dosed in mg elemental calcium ) chewable tablet 200 mg of elemental calcium   1 tablet Oral BID PRN Albustami, Omar M, MD       Chlorhexidine  Gluconate Cloth 2 % PADS 6 each  6 each Topical Daily Gleason, Laura R, PA-C   6 each at 09/21/23 0956   ciprofloxacin  (CIPRO ) IVPB 400 mg  400 mg Intravenous Q12H Paliwal, Ria, MD 200 mL/hr at 09/21/23 1319 Infusion Verify at 09/21/23 1319   divalproex  (DEPAKOTE ) DR tablet 750 mg  750 mg Oral Q12H Dahal, Chapman, MD   750 mg at 09/21/23 0954   docusate sodium  (COLACE) capsule 100 mg  100 mg Oral BID PRN Gleason, Laura R, PA-C       donepezil  (ARICEPT ) tablet 5 mg  5 mg Oral QHS Albustami, Omar M, MD   5 mg at 09/20/23 2243   feeding supplement (ENSURE PLUS HIGH PROTEIN) liquid 237 mL  237 mL Oral TID BM Desai, Nikita S, MD   237 mL at 09/21/23 1430   levETIRAcetam  (KEPPRA ) tablet 1,000 mg  1,000 mg Oral BID Albustami, Omar M, MD   1,000 mg at 09/21/23 0954   lip balm (CARMEX) ointment   Topical PRN Desai, Nikita S, MD   Given at 09/18/23 1625   metroNIDAZOLE  (FLAGYL ) IVPB 500 mg  500 mg Intravenous Q12H Haze Ria, MD   Stopped at 09/21/23 1232   multivitamin with minerals tablet 1 tablet  1 tablet Oral Daily Desai, Nikita S, MD   1 tablet at 09/21/23 0955   pantoprazole  (PROTONIX ) EC tablet 40 mg  40 mg Oral Daily Albustami, Omar M, MD   40 mg at 09/21/23 0955    polyethylene glycol (MIRALAX  / GLYCOLAX ) packet 17 g  17 g Oral Daily PRN Gleason, Laura R, PA-C       sodium chloride  flush (NS) 0.9 % injection 10-40 mL  10-40 mL Intracatheter PRN Carolee Sherwood BIRCH III, MD       sodium zirconium cyclosilicate  (LOKELMA ) packet 10 g  10 g Oral BID Hongalgi, Anand D, MD   10 g at 09/21/23 0955     Objective: Vital: Vitals:   09/21/23 0919 09/21/23 1002 09/21/23 1239 09/21/23 1345  BP: 107/78 114/77 109/76 103/82  Pulse: 80 69 79 78  Resp:  20  20  Temp: 98.2 F (36.8 C) 97.8 F (36.6 C) 98.5 F (36.9 C) 97.7 F (36.5 C)  TempSrc: Oral Oral Oral Oral  SpO2: 100% 100% 100% 99%  Weight:      Height:       I/Os: I/O last 3 completed shifts: In: 10459.3 [P.O.:1720; I.V.:2013.8; Other:6000; IV Piggyback:725.5] Out: 8325 [Urine:8325]  Physical Exam:  General: Patient is in no apparent distress Lungs: Normal respiratory effort, chest expands symmetrically. GI: The abdomen is soft and nontender without mass. Foley: I stopped the cbi. Urine is clear Ext: lower extremities symmetric  Lab Results: Recent Labs  09/20/23 1645 09/20/23 2131 09/21/23 0212 09/21/23 1453  WBC 19.5* 16.0* 13.2*  --   HGB 9.2* 7.6* 7.1* 7.4*  HCT 30.3* 24.8* 23.1* 23.9*   Recent Labs    09/19/23 0918 09/20/23 0357 09/21/23 0212  NA 146* 146* 141  K 4.8 4.9 5.3*  CL 109 112* 110  CO2 26 27 27   GLUCOSE 113* 92 95  BUN 40* 34* 27*  CREATININE 1.40* 1.18 1.11  CALCIUM  8.9 9.0 8.8*   Recent Labs    09/21/23 0212  INR 1.3*   No results for input(s): LABURIN in the last 72 hours. Results for orders placed or performed during the hospital encounter of 09/18/23  MRSA Next Gen by PCR, Nasal     Status: None   Collection Time: 09/18/23  1:54 AM   Specimen: Nasal Mucosa; Nasal Swab  Result Value Ref Range Status   MRSA by PCR Next Gen NOT DETECTED NOT DETECTED Final    Comment: (NOTE) The GeneXpert MRSA Assay (FDA approved for NASAL specimens only), is one  component of a comprehensive MRSA colonization surveillance program. It is not intended to diagnose MRSA infection nor to guide or monitor treatment for MRSA infections. Test performance is not FDA approved in patients less than 10 years old. Performed at [facility], [address], [place], [place] [address]   Culture, blood (Routine X 2) w Reflex to ID Panel     Status: None (Preliminary result)   Collection Time: 09/18/23  2:22 AM   Specimen: BLOOD LEFT HAND  Result Value Ref Range Status   Specimen Description BLOOD LEFT HAND  Final   Special Requests   Final    BOTTLES DRAWN AEROBIC ONLY Blood Culture results may not be optimal due to an inadequate volume of blood received in culture bottles   Culture   Final    NO GROWTH 3 DAYS Performed at [facility], [address], [place], [place] [address]    Report Status PENDING  Incomplete  Culture, blood (Routine X 2) w Reflex to ID Panel     Status: None (Preliminary result)   Collection Time: 09/18/23  2:22 AM   Specimen: BLOOD LEFT HAND  Result Value Ref Range Status   Specimen Description BLOOD LEFT HAND  Final   Special Requests   Final    BOTTLES DRAWN AEROBIC ONLY Blood Culture results may not be optimal due to an inadequate volume of blood received in culture bottles   Culture   Final    NO GROWTH 3 DAYS Performed at [facility], [address], [place], [place] [address]    Report Status PENDING  Incomplete  Aerobic/Anaerobic Culture w Gram Stain (surgical/deep wound)     Status: None (Preliminary result)   Collection Time: 09/20/23  2:32 PM   Specimen: Wound; Abscess  Result Value Ref Range Status   Specimen Description ABSCESS  Final   Special Requests PROSTATE  Final   Gram Stain   Final    FEW WBC PRESENT, PREDOMINANTLY PMN NO ORGANISMS SEEN    Culture   Final    NO GROWTH < 24 HOURS Performed at [facility], [address], [place], [place] [address]    Report Status PENDING   Incomplete    Studies/Results: DG CHEST PORT 1 VIEW Result Date: 09/20/2023 CLINICAL DATA:  [id number] PICC (peripherally inserted central catheter) in place [id number] EXAM: PORTABLE CHEST 1 VIEW COMPARISON:  CT chest 08/21/2021, chest  x-ray 09/17/2023 FINDINGS: Left PICC insertion with tip just distal to the expected superior cavoatrial junction. Cardiomegaly. Otherwise the heart and mediastinal contours are within normal limits. Left lung base collimated off view. No focal consolidation. No pulmonary edema. No pleural effusion. No pneumothorax. No acute osseous abnormality. IMPRESSION: No active disease. Electronically Signed   By: Morgane  Naveau M.D.   On: 09/20/2023 20:45   US  EKG SITE RITE Result Date: 09/20/2023 If Site Rite image not attached, placement could not be confirmed due to current cardiac rhythm.  DG CHEST PORT 1 VIEW Result Date: 09/20/2023 CLINICAL DATA:  Preoperative respiratory evaluation. EXAM: PORTABLE CHEST 1 VIEW COMPARISON:  09/17/2023 FINDINGS: Low volume film. The cardio pericardial silhouette is enlarged. Mild asymmetric elevation left hemidiaphragm. Right IJ central line seen previously has been removed in the interval. Prominent skin fold noted over the right upper lobe. The lungs are clear without focal pneumonia, edema, pneumothorax or pleural effusion. Telemetry leads overlie the chest. IMPRESSION: Low volume film without acute cardiopulmonary findings. Electronically Signed   By: Camellia Candle M.D.   On: 09/20/2023 07:47    Assessment: Prostate abscess  Procedure(s): TURP (TRANSURETHRAL RESECTION OF PROSTATE), 1 Day Post-Op  doing well.  Plan: Continue antibiotics for 2 weeks. Keep foley for 10 days   Sherwood Edison, MD Urology 09/21/2023, 4:45 PM

## 2023-09-21 NOTE — Progress Notes (Signed)
 PROGRESS NOTE   Nathan Shepherd  FMW:[id number]    DOB: [birth year]    DOA: 09/18/2023  PCP: Gammon, Chrystal, NP   I have briefly reviewed patients previous medical records in [facility].   Brief Hospital Course:  68 y.o. male, has a legal guardian, with PMH significant for HTN, HLD, CHF, CKD, chronic A-fib on Coumadin , CVA, prior substance abuse. Recently hospitalized twice at [facility].   9/1-9/4 for severe hemorrhoidal bleeding s/p hemorrhoidectomy and discharged back to group home. 9/5 to 9/11 for E. coli UTI, generalized weakness and discharged to SNF. 9/14, presented to [facility] after significant amount of blood loss was noted in his diapers.   In the ED, he was hypotensive. Hemoglobin dropped from 9.6 to 6.7, INR 4.87  CTA showed active GI bleeding in the rectum and extensive inflammatory change surrounding the rectum with rim-enhancing fluid collection involving the right aspect of the prostate that may reflect abscess.  Right IJ central line was placed emergently. Patient was given 2 units PRBCs, Kcentra, vitamin K , IV fluid. Started on broad-spectrum IV antibiotics for possible sepsis. Was transferred from [facility] to [facility] ICU.    While in ICU, blood pressure improved, no further bleeding was noticed.  Patient did not require pressors. 9/16, transferred out to [facility].  On 9/16, transferred from [facility] to [facility] for evaluation by urology and transurethral resection/unroofing of prostatic abscess.  S/p prostate abscess procedure 9/17.   Assessment & Plan:   Hemorrhagic shock Acute on chronic blood loss anemia Secondary to recent hemorrhoidectomy in the context of supratherapeutic INR Required ICU admission, close monitoring, IV fluid resuscitation and shock resolved.  Did not require vasopressors Unclear how many units PRBC he was transfused thus far this admission.  He presented with hemoglobin of 7.5 (11.2 on 9/9) which dropped to 6.6.   Despite transfusions, hemoglobin at 6.6 today.  Will transfuse an additional unit of PRBC and follow posttransfusion CBCs with aim to keep hemoglobin >7. Per nursing 9/17, streaks of blood per rectum and no report of large-volume rectal bleeding.  After a unit of PRBC transfusion, hemoglobin transiently went up from 6.6 >9 0.2 > 7.6 but has drifted down to 7.1.  Transfused additional unit of PRBCs and follow closely.  Recurrent rectal bleeding Severe hemorrhoidal bleeding s/p hemorrhoidectomy 09/05/2023 Initial CTA showed active bleeding in the rectum. However clinically, bleeding seems to have slowed down. As per general surgery input from 9/16, his rectal bleeding was due to supratherapeutic INR (4.87 this admission).  They removed the hemorrhoidal tissue and felt that the anticoagulation at this point was making him more prone to bleeding.   S/p INR reversal with Kcentra and vitamin K  10 mg IV x 1.  Coumadin  currently on hold and will need to reassess risk versus benefit prior to resuming.  INR 1.3 on 9/18.  Prostate abscess CT pelvis suggested prostate abscess. Dr. Bell/urology consulted and patient was transferred from [facility] to [facility] for surgery S/p transurethral resection/unroofing of prostatic abscess on 9/17.  Foley catheter in place, defer this continue timing to urology. Continue empirically started IV ciprofloxacin  and metronidazole . Blood cultures negative to date.  Abscess culture negative to date.  Chronic A-fib Rate controlled.  Carvedilol  on hold due to recent hemorrhagic shock.  Consider resuming at low-dose soon. Anticoagulation reversed as noted above.   Currently holding warfarin.  See discussion above regarding resumption  Chronic hronic diastolic CHF HTN Echo 9/15 with EF 55 to 60%,  no WMA, moderately dilated LA Blood pressure is well-controlled. PTA meds- Coreg , torsemide  Currently both on hold   CAD, HLD Resume statins when able.   CKD 3A Creatinine at  baseline which is not clearly known but may be in the 1.2-1.4 range.  Stable.  Seizure disorder PTA meds- Keppra , Depakote  Continue both.  Prior [facility] MD had asked pharmacy to review the dose of Depakote  outpatient and inpatient   H/o polysubstance abuse Reports no recent abuse   Dementia Continue Aricept   Mild hypernatremia Resolved.  Mild hyperkalemia Unclear etiology Kayexalate ordered.  Follow BMP in AM.  Body mass index is 22.78 kg/m.   DVT prophylaxis: SCDs Start: 09/18/23 0102     Code Status: Full Code:  Family Communication: Discussed in detail on a conference call with patient's RN, outpatient legal guardian (Ms. Dorn Gelineau) and OP social worker (Ms. Nonda).  Updated care and answered all questions.  None at bedside today. Disposition:  Anemia with ongoing blood transfusions, recent prostate abscess surgery with IV antibiotics.     Consultants:   General surgery Urology  Procedures:   Prostate surgery by urology on 9/17 LUE PICC line 9/17  Subjective:  Poor historian.  Denies complaints.  Specifically denies pain or bleeding.  Objective:   Vitals:   09/20/23 2345 09/21/23 0422 09/21/23 0919 09/21/23 1002  BP: 99/71 120/76 107/78 114/77  Pulse: 83 70 80 69  Resp: 16 18    Temp: 97.7 F (36.5 C) 98.4 F (36.9 C) 98.2 F (36.8 C) 97.8 F (36.6 C)  TempSrc: Oral Oral Oral Oral  SpO2: 100% 99% 100% 100%  Weight:      Height:        General exam: Middle-age male, moderately built and chronically ill looking lying comfortably propped up in bed without distress. Respiratory system: Clear to auscultation.  No increased work of breathing. Cardiovascular system: S1 & S2 heard, RRR. No JVD, murmurs, rubs, gallops or clicks. No pedal edema.  Telemetry personally reviewed: A-fib with controlled ventricular rate. Gastrointestinal system: Abdomen is nondistended, soft and nontender. No organomegaly or masses felt. Normal bowel sounds heard. Central  nervous system: Alert and oriented x 2 as noted above. No focal neurological deficits. Extremities: Symmetric 5 x 5 power.  LUE PICC line without acute findings. Skin: No rashes, lesions or ulcers Psychiatry: Judgement and insight impaired. Mood & affect flat. GU: Has Foley catheter.    Data Reviewed:   I have personally reviewed following labs and imaging studies   CBC: Recent Labs  Lab 09/20/23 0357 09/20/23 1645 09/20/23 2131 09/21/23 0212  WBC 16.2* 19.5* 16.0* 13.2*  NEUTROABS 12.8*  --   --  11.0*  HGB 6.6* 9.2* 7.6* 7.1*  HCT 22.1* 30.3* 24.8* 23.1*  MCV 88.4 87.8 86.4 87.8  PLT 253 310 274 215    Basic Metabolic Panel: Recent Labs  Lab 09/18/23 0140 09/19/23 0918 09/20/23 0357 09/21/23 0212  NA 143  142 146* 146* 141  K 4.1  4.1 4.8 4.9 5.3*  CL 109  109 109 112* 110  CO2 28  29 26 27 27   GLUCOSE 117*  116* 113* 92 95  BUN 36*  37* 40* 34* 27*  CREATININE 1.39*  1.41* 1.40* 1.18 1.11  CALCIUM  7.9*  8.0* 8.9 9.0 8.8*  MG 1.6*  --   --   --   PHOS 2.9  --   --   --     Liver Function Tests: Recent Labs  Lab 09/18/23  0140  AST 17  ALT 13  ALKPHOS 40  BILITOT 1.1  PROT 4.6*  ALBUMIN <1.5*    CBG: Recent Labs  Lab 09/18/23 0057  GLUCAP 108*    Microbiology Studies:   Recent Results (from the past 240 hours)  Culture, blood (Routine X 2) w Reflex to ID Panel     Status: None   Collection Time: 09/11/23  6:03 PM   Specimen: BLOOD  Result Value Ref Range Status   Specimen Description BLOOD LEFT ANTECUBITAL  Final   Special Requests AEROBIC BOTTLE ONLY Blood Culture adequate volume  Final   Culture   Final    NO GROWTH 5 DAYS Performed at [facility], [address] [place], [place] [address]    Report Status 09/16/2023 FINAL  Final  Culture, blood (Routine X 2) w Reflex to ID Panel     Status: None   Collection Time: 09/11/23  6:03 PM   Specimen: BLOOD  Result Value Ref Range Status   Specimen Description BLOOD BLOOD LEFT  WRIST  Final   Special Requests AEROBIC BOTTLE ONLY Blood Culture adequate volume  Final   Culture   Final    NO GROWTH 5 DAYS Performed at [facility], [address], [place], [place] [address]    Report Status 09/16/2023 FINAL  Final  MRSA Next Gen by PCR, Nasal     Status: None   Collection Time: 09/18/23  1:54 AM   Specimen: Nasal Mucosa; Nasal Swab  Result Value Ref Range Status   MRSA by PCR Next Gen NOT DETECTED NOT DETECTED Final    Comment: (NOTE) The GeneXpert MRSA Assay (FDA approved for NASAL specimens only), is one component of a comprehensive MRSA colonization surveillance program. It is not intended to diagnose MRSA infection nor to guide or monitor treatment for MRSA infections. Test performance is not FDA approved in patients less than 33 years old. Performed at [facility], [address] [place], [place] [address]   Culture, blood (Routine X 2) w Reflex to ID Panel     Status: None (Preliminary result)   Collection Time: 09/18/23  2:22 AM   Specimen: BLOOD LEFT HAND  Result Value Ref Range Status   Specimen Description BLOOD LEFT HAND  Final   Special Requests   Final    BOTTLES DRAWN AEROBIC ONLY Blood Culture results may not be optimal due to an inadequate volume of blood received in culture bottles   Culture   Final    NO GROWTH 3 DAYS Performed at [facility], [address], [place], [place] [address]    Report Status PENDING  Incomplete  Culture, blood (Routine X 2) w Reflex to ID Panel     Status: None (Preliminary result)   Collection Time: 09/18/23  2:22 AM   Specimen: BLOOD LEFT HAND  Result Value Ref Range Status   Specimen Description BLOOD LEFT HAND  Final   Special Requests   Final    BOTTLES DRAWN AEROBIC ONLY Blood Culture results may not be optimal due to an inadequate volume of blood received in culture bottles   Culture   Final    NO GROWTH 3 DAYS Performed at [facility], [address] [place], [place]  [address]    Report Status PENDING  Incomplete  Aerobic/Anaerobic Culture w Gram Stain (surgical/deep wound)     Status: None (Preliminary result)   Collection Time: 09/20/23  2:32 PM   Specimen: Wound;  Abscess  Result Value Ref Range Status   Specimen Description ABSCESS  Final   Special Requests PROSTATE  Final   Gram Stain   Final    FEW WBC PRESENT, PREDOMINANTLY PMN NO ORGANISMS SEEN    Culture   Final    NO GROWTH < 24 HOURS Performed at [facility], [address], [place], [place] [address]    Report Status PENDING  Incomplete    Radiology Studies:  DG CHEST PORT 1 VIEW Result Date: 09/20/2023 CLINICAL DATA:  [id number] PICC (peripherally inserted central catheter) in place [id number] EXAM: PORTABLE CHEST 1 VIEW COMPARISON:  CT chest 08/21/2021, chest x-ray 09/17/2023 FINDINGS: Left PICC insertion with tip just distal to the expected superior cavoatrial junction. Cardiomegaly. Otherwise the heart and mediastinal contours are within normal limits. Left lung base collimated off view. No focal consolidation. No pulmonary edema. No pleural effusion. No pneumothorax. No acute osseous abnormality. IMPRESSION: No active disease. Electronically Signed   By: Morgane  Naveau M.D.   On: 09/20/2023 20:45   US  EKG SITE RITE Result Date: 09/20/2023 If Site Rite image not attached, placement could not be confirmed due to current cardiac rhythm.  DG CHEST PORT 1 VIEW Result Date: 09/20/2023 CLINICAL DATA:  Preoperative respiratory evaluation. EXAM: PORTABLE CHEST 1 VIEW COMPARISON:  09/17/2023 FINDINGS: Low volume film. The cardio pericardial silhouette is enlarged. Mild asymmetric elevation left hemidiaphragm. Right IJ central line seen previously has been removed in the interval. Prominent skin fold noted over the right upper lobe. The lungs are clear without focal pneumonia, edema, pneumothorax or pleural effusion. Telemetry leads overlie the chest. IMPRESSION: Low volume film without acute  cardiopulmonary findings. Electronically Signed   By: Camellia Candle M.D.   On: 09/20/2023 07:47    Scheduled Meds:    sodium chloride    Intravenous Once   Chlorhexidine  Gluconate Cloth  6 each Topical Daily   dextrose   25 g Intravenous Once   divalproex   750 mg Oral Q12H   donepezil   5 mg Oral QHS   feeding supplement  237 mL Oral TID BM   levETIRAcetam   1,000 mg Oral BID   multivitamin with minerals  1 tablet Oral Daily   pantoprazole   40 mg Oral Daily   sodium zirconium cyclosilicate   10 g Oral BID    Continuous Infusions:    sodium chloride  Stopped (09/21/23 0953)   ciprofloxacin  200 mL/hr at 09/21/23 0956   metronidazole  100 mL/hr at 09/21/23 0956     LOS: 3 days     Trenda Mar, MD,  FACP, [facility], [facility], [facility]   [facility] [place]      To contact the attending provider between 7A-7P or the covering provider during after hours 7P-7A, please log into the web site [email or web address] and access using universal [place] password for that web site. If you do not have the password, please call the hospital operator.  09/21/2023, 10:53 AM

## 2023-09-22 DIAGNOSIS — N412 Abscess of prostate: Secondary | ICD-10-CM | POA: Diagnosis not present

## 2023-09-22 DIAGNOSIS — D62 Acute posthemorrhagic anemia: Secondary | ICD-10-CM | POA: Diagnosis not present

## 2023-09-22 LAB — BASIC METABOLIC PANEL WITH GFR
Anion gap: 5 (ref 5–15)
BUN: 28 mg/dL — ABNORMAL HIGH (ref 8–23)
CO2: 26 mmol/L (ref 22–32)
Calcium: 8.5 mg/dL — ABNORMAL LOW (ref 8.9–10.3)
Chloride: 109 mmol/L (ref 98–111)
Creatinine, Ser: 1.06 mg/dL (ref 0.61–1.24)
GFR, Estimated: >60 mL/min (ref >60)
Glucose, Bld: 85 mg/dL (ref 70–99)
Potassium: 5 mmol/L (ref 3.5–5.1)
Sodium: 140 mmol/L (ref 135–145)

## 2023-09-22 LAB — CBC WITH DIFFERENTIAL/PLATELET
Basophils Absolute: 0 K/uL (ref 0.0–0.1)
Basophils Relative: 0 %
Eosinophils Absolute: 0 K/uL (ref 0.0–0.5)
Eosinophils Relative: 0 %
HCT: 23.3 % — ABNORMAL LOW (ref 39.0–52.0)
Hemoglobin: 7 g/dL — ABNORMAL LOW (ref 13.0–17.0)
Lymphocytes Relative: 3 %
Lymphs Abs: 0.3 K/uL — ABNORMAL LOW (ref 0.7–4.0)
MCH: 25.8 pg — ABNORMAL LOW (ref 26.0–34.0)
MCHC: 30 g/dL (ref 30.0–36.0)
MCV: 86 fL (ref 80.0–100.0)
Monocytes Absolute: 0.2 K/uL (ref 0.1–1.0)
Monocytes Relative: 2 %
Neutro Abs: 9.2 K/uL — ABNORMAL HIGH (ref 1.7–7.7)
Neutrophils Relative %: 95 %
Platelets: 188 K/uL (ref 150–400)
RBC: 2.71 MIL/uL — ABNORMAL LOW (ref 4.22–5.81)
RDW: 20.8 % — ABNORMAL HIGH (ref 11.5–15.5)
WBC: 9.7 K/uL (ref 4.0–10.5)
nRBC: 0.2 % (ref 0.0–0.2)

## 2023-09-22 LAB — HEMOGLOBIN AND HEMATOCRIT, BLOOD
HCT: 31.1 % — ABNORMAL LOW (ref 39.0–52.0)
Hemoglobin: 9.4 g/dL — ABNORMAL LOW (ref 13.0–17.0)

## 2023-09-22 LAB — PREPARE RBC (CROSSMATCH)

## 2023-09-22 MED ORDER — CARVEDILOL 3.125 MG PO TABS
3.1250 mg | ORAL_TABLET | Freq: Two times a day (BID) | ORAL | Status: DC
  Administered 2023-09-22 – 2023-09-24 (×5): 3.125 mg via ORAL
  Filled 2023-09-22 (×5): qty 1

## 2023-09-22 MED ORDER — FLUTICASONE FUROATE-VILANTEROL 200-25 MCG/ACT IN AEPB
1.0000 | INHALATION_SPRAY | Freq: Every day | RESPIRATORY_TRACT | Status: DC
Start: 2023-09-22 — End: 2023-09-24
  Administered 2023-09-22 – 2023-09-24 (×3): 1 via RESPIRATORY_TRACT
  Filled 2023-09-22: qty 28

## 2023-09-22 MED ORDER — SODIUM CHLORIDE 0.9% IV SOLUTION: Freq: Once | INTRAVENOUS | Status: AC

## 2023-09-22 MED ORDER — ROSUVASTATIN CALCIUM 20 MG PO TABS
20.0000 mg | ORAL_TABLET | Freq: Every day | ORAL | Status: DC
  Administered 2023-09-22 – 2023-09-23 (×2): 20 mg via ORAL
  Filled 2023-09-22 (×2): qty 1

## 2023-09-22 MED ORDER — TORSEMIDE 20 MG PO TABS
20.0000 mg | ORAL_TABLET | Freq: Every morning | ORAL | Status: DC
  Administered 2023-09-22 – 2023-09-24 (×3): 20 mg via ORAL
  Filled 2023-09-22 (×3): qty 1

## 2023-09-22 NOTE — Plan of Care (Signed)

## 2023-09-22 NOTE — TOC Progression Note (Signed)
 Transition of Care [facility]) - Progression Note   Patient Details  Name: Nathan Shepherd MRN: [id number] Date of Birth: Apr 18, 1955  Transition of Care [facility]) CM/SW Contact  [name] [name], [name] Phone Number: 09/22/2023, 11:41 AM  Clinical Narrative: CSW spoke with legal guardian, [name], regarding a possible weekend discharge back to SNF. [name] requested discharge summary be faxed to [phone].  Expected Discharge Plan: Skilled Nursing Facility Barriers to Discharge: Continued Medical Work up, SNF Pending bed offer  Expected Discharge Plan and Services In-house Referral: Clinical Social Work Living arrangements for the past 2 months: Assisted Living Facility, Marketing executive, Skilled Nursing Facility  Social Drivers of Health (SDOH) Interventions SDOH Screenings   Food Insecurity: No Food Insecurity (09/19/2023)  Housing: Low Risk  (09/19/2023)  Transportation Needs: No Transportation Needs (09/19/2023)  Utilities: Not At Risk (09/19/2023)  Social Connections: Socially Isolated (09/19/2023)  Tobacco Use: Low Risk  (09/19/2023)   Readmission Risk Interventions    09/12/2023    7:53 AM  Readmission Risk Prevention Plan  Transportation Screening Complete  HRI or Home Care Consult Complete  Social Work Consult for Recovery Care Planning/Counseling Complete  Palliative Care Screening Not Applicable  Medication Review Oceanographer) Complete

## 2023-09-22 NOTE — Progress Notes (Addendum)
 PROGRESS NOTE   Nathan Shepherd  FMW:[id number]    DOB: 05-Dec-1955    DOA: 09/18/2023  PCP: Gammon, Chrystal, NP   I have briefly reviewed patients previous medical records in [facility].   Brief Hospital Course:  68 y.o. male, has a legal guardian, with PMH significant for HTN, HLD, CHF, CKD, chronic A-fib on Coumadin , CVA, prior substance abuse. Recently hospitalized twice at [facility].   9/1-9/4 for severe hemorrhoidal bleeding s/p hemorrhoidectomy and discharged back to group home. 9/5 to 9/11 for E. coli UTI, generalized weakness and discharged to SNF. 9/14, presented to [facility] after significant amount of blood loss was noted in his diapers.   In the ED, he was hypotensive. Hemoglobin dropped from 9.6 to 6.7, INR 4.87  CTA showed active GI bleeding in the rectum and extensive inflammatory change surrounding the rectum with rim-enhancing fluid collection involving the right aspect of the prostate that may reflect abscess.  Right IJ central line was placed emergently. Patient was given 2 units PRBCs, Kcentra, vitamin K , IV fluid. Started on broad-spectrum IV antibiotics for possible sepsis. Was transferred from [facility] to [facility] ICU.    While in ICU, blood pressure improved, no further bleeding was noticed.  Patient did not require pressors. 9/16, transferred out to [facility].  On 9/16, transferred from [facility] to [facility] for evaluation by urology and transurethral resection/unroofing of prostatic abscess.  S/p prostate abscess procedure 9/17.   Assessment & Plan:   Hemorrhagic shock Acute on chronic blood loss anemia Secondary to recent hemorrhoidectomy in the context of supratherapeutic INR Required ICU admission, close monitoring, IV fluid resuscitation and shock resolved.  Did not require vasopressors Unclear how many units PRBC he was transfused thus far this admission.  He presented with hemoglobin of 7.5 (11.2 on 9/9) which dropped to 6.6.    Has been relatively transfusion dependent since hospital admission.  Has received multiple units of blood transfusion.  No precipitous drop in hemoglobin but hemoglobin keeps drifting down.  Hemoglobin back to 7 g per DL, transfuse additional unit of PRBC and follow. No evidence of overt bleeding as per extensive discussion with patient's RN, and also patient although he is a poor historian.  EBL at recent urology procedure was reported to be minimal.  If he continues to not maintain his hemoglobin, may need further evaluation.  Recurrent rectal bleeding Severe hemorrhoidal bleeding s/p hemorrhoidectomy 09/05/2023 Initial CTA showed active bleeding in the rectum. However clinically, overt bleeding seems to stopped. As per general surgery input from 9/16, his rectal bleeding was due to supratherapeutic INR (4.87 this admission).  They removed the hemorrhoidal tissue and felt that the anticoagulation at this point was making him more prone to bleeding.   S/p INR reversal with Kcentra and vitamin K  10 mg IV x 1.  Coumadin  currently on hold and will need to reassess risk versus benefit prior to resuming.  INR 1.3 on 9/18.  Prostate abscess CT pelvis suggested prostate abscess. Dr. Bell/urology consulted and patient was transferred from [facility] to [facility] for surgery S/p transurethral resection/unroofing of prostatic abscess on 9/17.  As per urology follow-up, recommend 2 weeks of antibiotics post procedure and keep Foley for 10 days.  Can transition to p.o. antibiotics at time of discharge. Continue empirically started IV ciprofloxacin  and metronidazole . Blood cultures negative to date.  Abscess culture negative to date, culture reintubated for better growth.  Chronic A-fib Rate controlled.  Carvedilol  was on hold due  to recent hemorrhagic shock but now that his BPs are stable, resumed carvedilol  at low-dose. Currently holding warfarin.  See discussion above regarding resumption  Chronic diastolic  CHF HTN Echo 9/15 with EF 55 to 60%, no WMA, moderately dilated LA Blood pressure is well-controlled. PTA meds- Coreg , torsemide , resumed both.  CAD, HLD Restarted statins.   CKD 3A Creatinine at baseline which is not clearly known but may be in the 1.2-1.4 range.  Stable.  Seizure disorder PTA meds- Keppra , Depakote  Continue both.     H/o polysubstance abuse Reports no recent abuse   Dementia Continue Aricept   Mild hypernatremia Resolved.  Mild hyperkalemia Unclear etiology Resolved post Kayexalate.  Body mass index is 24.04 kg/m.   DVT prophylaxis: SCDs Start: 09/18/23 0102     Code Status: Full Code:  Family Communication: Discussed in detail on a conference call with patient's RN, outpatient legal guardian (Ms. Dorn Gelineau) and OP social worker (Ms. Nonda).  Updated care and answered all questions.  None at bedside today. Disposition:  Anemia with ongoing blood transfusions, recent prostate abscess surgery with IV antibiotics.     Consultants:   General surgery Urology  Procedures:   Prostate surgery by urology on 9/17 LUE PICC line 9/17  Subjective:  As per extensive discussion with RN today, no BM in the last 24 hours.  No report of overt bleeding.  No acute events reported.  Tolerating diet.  As per patient, denies complaints.  [facility] indicates [facility] [place], [place], independent at the facility.  Objective:   Vitals:   09/22/23 0454 09/22/23 0500 09/22/23 0615 09/22/23 0645  BP: 113/87  113/76 121/86  Pulse: 76  77 75  Resp: 18  20 20   Temp: 98.6 F (37 C)  98.7 F (37.1 C) 98 F (36.7 C)  TempSrc: Oral  Oral Oral  SpO2: 100%  100% 100%  Weight:  89.6 kg    Height:        General exam: Middle-age male, moderately built and chronically ill looking lying comfortably propped up in bed without distress.  Oral mucosa moist. Respiratory system: Clear to auscultation.  No increased work of  breathing. Cardiovascular system: S1 & S2 heard, RRR. No JVD, murmurs, rubs, gallops or clicks. No pedal edema.  Telemetry personally reviewed: A-fib with controlled ventricular rate. Gastrointestinal system: Abdomen is nondistended, soft and nontender. No organomegaly or masses felt. Normal bowel sounds heard. Central nervous system: Alert and oriented x 2 as noted above. No focal neurological deficits. Extremities: Symmetric 5 x 5 power.  LUE PICC line without acute findings. Skin: No rashes, lesions or ulcers Psychiatry: Judgement and insight impaired. Mood & affect flat. GU: Has Foley catheter.    Data Reviewed:   I have personally reviewed following labs and imaging studies   CBC: Recent Labs  Lab 09/20/23 0357 09/20/23 1645 09/20/23 2131 09/21/23 0212 09/21/23 1453 09/22/23 0328  WBC 16.2*   < > 16.0* 13.2*  --  9.7  NEUTROABS 12.8*  --   --  11.0*  --  9.2*  HGB 6.6*   < > 7.6* 7.1* 7.4* 7.0*  HCT 22.1*   < > 24.8* 23.1* 23.9* 23.3*  MCV 88.4   < > 86.4 87.8  --  86.0  PLT 253   < > 274 215  --  188   < > = values in this interval not displayed.    Basic Metabolic Panel: Recent Labs  Lab 09/18/23 0140  09/19/23 0918 09/20/23 0357 09/21/23 0212 09/22/23 0328  NA 143  142 146* 146* 141 140  K 4.1  4.1 4.8 4.9 5.3* 5.0  CL 109  109 109 112* 110 109  CO2 28  29 26 27 27 26   GLUCOSE 117*  116* 113* 92 95 85  BUN 36*  37* 40* 34* 27* 28*  CREATININE 1.39*  1.41* 1.40* 1.18 1.11 1.06  CALCIUM  7.9*  8.0* 8.9 9.0 8.8* 8.5*  MG 1.6*  --   --   --   --   PHOS 2.9  --   --   --   --     Liver Function Tests: Recent Labs  Lab 09/18/23 0140  AST 17  ALT 13  ALKPHOS 40  BILITOT 1.1  PROT 4.6*  ALBUMIN <1.5*    CBG: Recent Labs  Lab 09/18/23 0057  GLUCAP 108*    Microbiology Studies:   Recent Results (from the past 240 hours)  MRSA Next Gen by PCR, Nasal     Status: None   Collection Time: 09/18/23  1:54 AM   Specimen: Nasal Mucosa; Nasal  Swab  Result Value Ref Range Status   MRSA by PCR Next Gen NOT DETECTED NOT DETECTED Final    Comment: (NOTE) The GeneXpert MRSA Assay (FDA approved for NASAL specimens only), is one component of a comprehensive MRSA colonization surveillance program. It is not intended to diagnose MRSA infection nor to guide or monitor treatment for MRSA infections. Test performance is not FDA approved in patients less than 23 years old. Performed at [facility], [address] [place], [place] [address]   Culture, blood (Routine X 2) w Reflex to ID Panel     Status: None (Preliminary result)   Collection Time: 09/18/23  2:22 AM   Specimen: BLOOD LEFT HAND  Result Value Ref Range Status   Specimen Description BLOOD LEFT HAND  Final   Special Requests   Final    BOTTLES DRAWN AEROBIC ONLY Blood Culture results may not be optimal due to an inadequate volume of blood received in culture bottles   Culture   Final    NO GROWTH 4 DAYS Performed at [facility], [address] [place], [place] [address]    Report Status PENDING  Incomplete  Culture, blood (Routine X 2) w Reflex to ID Panel     Status: None (Preliminary result)   Collection Time: 09/18/23  2:22 AM   Specimen: BLOOD LEFT HAND  Result Value Ref Range Status   Specimen Description BLOOD LEFT HAND  Final   Special Requests   Final    BOTTLES DRAWN AEROBIC ONLY Blood Culture results may not be optimal due to an inadequate volume of blood received in culture bottles   Culture   Final    NO GROWTH 4 DAYS Performed at [facility], [address], [place], [place] [address]    Report Status PENDING  Incomplete  Aerobic/Anaerobic Culture w Gram Stain (surgical/deep wound)     Status: None (Preliminary result)   Collection Time: 09/20/23  2:32 PM   Specimen: Wound; Abscess  Result Value Ref Range Status   Specimen Description ABSCESS  Final   Special Requests PROSTATE  Final   Gram Stain   Final    FEW WBC PRESENT,  PREDOMINANTLY PMN NO ORGANISMS SEEN    Culture   Final    CULTURE REINCUBATED FOR BETTER GROWTH Performed at [facility],  [address], [place], [place] [address]    Report Status PENDING  Incomplete    Radiology Studies:  DG CHEST PORT 1 VIEW Result Date: 09/20/2023 CLINICAL DATA:  [id number] PICC (peripherally inserted central catheter) in place [id number] EXAM: PORTABLE CHEST 1 VIEW COMPARISON:  CT chest 08/21/2021, chest x-ray 09/17/2023 FINDINGS: Left PICC insertion with tip just distal to the expected superior cavoatrial junction. Cardiomegaly. Otherwise the heart and mediastinal contours are within normal limits. Left lung base collimated off view. No focal consolidation. No pulmonary edema. No pleural effusion. No pneumothorax. No acute osseous abnormality. IMPRESSION: No active disease. Electronically Signed   By: Morgane  Naveau M.D.   On: 09/20/2023 20:45   US  EKG SITE RITE Result Date: 09/20/2023 If Site Rite image not attached, placement could not be confirmed due to current cardiac rhythm.   Scheduled Meds:    Chlorhexidine  Gluconate Cloth  6 each Topical Daily   divalproex   750 mg Oral Q12H   donepezil   5 mg Oral QHS   feeding supplement  237 mL Oral TID BM   levETIRAcetam   1,000 mg Oral BID   multivitamin with minerals  1 tablet Oral Daily   pantoprazole   40 mg Oral Daily    Continuous Infusions:    ciprofloxacin  200 mL/hr at 09/22/23 0747   metronidazole  Stopped (09/22/23 0248)     LOS: 4 days     Trenda Mar, MD,  FACP, [facility], [facility], [facility]   [facility] [place]      To contact the attending provider between 7A-7P or the covering provider during after hours 7P-7A, please log into the web site [email or web address] and access using universal [place] password for that web site. If you do not have the password, please call the hospital operator.  09/22/2023, 9:30 AM

## 2023-09-22 NOTE — Plan of Care (Signed)
   Problem: Clinical Measurements: Goal: Will remain free from infection Outcome: Progressing   Problem: Clinical Measurements: Goal: Diagnostic test results will improve Outcome: Progressing   Problem: Clinical Measurements: Goal: Respiratory complications will improve Outcome: Progressing   Problem: Clinical Measurements: Goal: Cardiovascular complication will be avoided Outcome: Progressing

## 2023-09-22 NOTE — Progress Notes (Signed)
 Physical Therapy Treatment Patient Details Name: Nathan Shepherd MRN: [id number] DOB: 03-Apr-1955 Today's Date: 09/22/2023   History of Present Illness 68 y.o. male, has a legal guardian, with PMH significant for HTN, HLD, CHF, CKD, chronic A-fib on Coumadin , CVA, prior substance abuse. Recently hospitalized twice at [facility]. From SNF due to significant blood loss from an active GI bleed. On 9/16, transferred from [facility] to [facility] for evaluation by urology and transurethral resection/unroofing of prostatic abscess.  \    PT Comments  PT - Cognition Comments: AxO x 1 required MAX encouragement and repeat VC's to stay on task.  Pt did share he is from [facility] and has a Son but admits they do not talk. Assisted OOB was difficult.  General bed mobility comments: Heavy cues for handplacement and sequencing throughout requiring + 2 assist to complete scooting to EOB.  Mod c/o weakness/fatigue.  I got to lay down but unable to express why.  Max encouragement to stand. General transfer comment: from elevated bed, Pt required Max Assist + 2 side by side and Max encouragement to participate.  Posture is poor with flexed hips and knees. General Gait Details: Max Assist + 2 with walker a limited distance of 8 feet with a third assist following with recliner.  Max encouragement to complete the distance and VC's to step up.  Steps are short and shuffled.  Posture is poor with forward fl;exed trunk as well as flexed hips and knees.  Max c/o weakness. Returned to room in recliner.  Positioned to comfort with multiple pillows.  Chair alarm activated LPT has rec Pt will need ST Rehab at SNF to address mobility and functional decline prior to safely returning home. .    If plan is discharge home, recommend the following: Help with stairs or ramp for entrance;Assistance with cooking/housework;A lot of help with bathing/dressing/bathroom;Direct supervision/assist for medications management;Two people  to help with walking and/or transfers   Can travel by private vehicle     No  Equipment Recommendations  None recommended by PT    Recommendations for Other Services       Precautions / Restrictions Precautions Precautions: Fall Recall of Precautions/Restrictions: Impaired Restrictions Weight Bearing Restrictions Per Provider Order: No     Mobility  Bed Mobility Overal bed mobility: Needs Assistance Bed Mobility: Supine to Sit     Supine to sit: Max assist, HOB elevated, +2 for safety/equipment, +2 for physical assistance     General bed mobility comments: Heavy cues for handplacement and sequencing throughout requiring + 2 assist to complete scooting to EOB.  Mod c/o weakness/fatigue.  I got to lay down but unable to express why.  Max encouragement to stand.    Transfers Overall transfer level: Needs assistance Equipment used: Rolling walker (2 wheels) Transfers: Sit to/from Stand, Bed to chair/wheelchair/BSC Sit to Stand: Max assist, +2 safety/equipment, Modified independent (Device/Increase time), +2 physical assistance           General transfer comment: from elevated bed, Pt required Max Assist + 2 side by side and Max encouragement to participate.  Posture is poor with flexed hips and knees.    Ambulation/Gait Ambulation/Gait assistance: Mod assist, Max assist, +2 physical assistance, +2 safety/equipment Gait Distance (Feet): 8 Feet Assistive device: Rolling walker (2 wheels) Gait Pattern/deviations: Trunk flexed, Decreased step length - right, Decreased stride length, Decreased step length - left       General Gait Details: Max Assist + 2 with walker a limited  distance of 8 feet with a third assist following with recliner.  Max encouragement to complete the distance and VC's to step up.  Steps are short and shuffled.  Posture is poor with forward fl;exed trunk as well as flexed hips and knees.  Max c/o weakness.   Stairs             Wheelchair  Mobility     Tilt Bed    Modified Rankin (Stroke Patients Only)       Balance                                            Communication Communication Communication: Impaired Factors Affecting Communication: Difficulty expressing self  Cognition Arousal: Alert Behavior During Therapy: Flat affect   PT - Cognitive impairments: History of cognitive impairments                       PT - Cognition Comments: AxO x 1 required MAX encouragement and repeat VC's to stay on task.  Pt did share he is from [facility] and has a Son but admits they do not talk. Following commands: Impaired Following commands impaired: Only follows one step commands consistently, Follows one step commands with increased time    Cueing Cueing Techniques: Verbal cues, Tactile cues, Gestural cues  Exercises      General Comments        Pertinent Vitals/Pain Pain Assessment Pain Assessment: No/denies pain    Home Living                          Prior Function            PT Goals (current goals can now be found in the care plan section) Progress towards PT goals: Progressing toward goals    Frequency    Min 2X/week      PT Plan      Co-evaluation              AM-PAC PT 6 Clicks Mobility   Outcome Measure  Help needed turning from your back to your side while in a flat bed without using bedrails?: A Lot Help needed moving from lying on your back to sitting on the side of a flat bed without using bedrails?: A Lot Help needed moving to and from a bed to a chair (including a wheelchair)?: A Lot Help needed standing up from a chair using your arms (e.g., wheelchair or bedside chair)?: A Lot Help needed to walk in hospital room?: A Lot Help needed climbing 3-5 steps with a railing? : Total 6 Click Score: 11    End of Session Equipment Utilized During Treatment: Gait belt Activity Tolerance: Patient limited by fatigue Patient left: in  chair;with chair alarm set Nurse Communication: Mobility status PT Visit Diagnosis: Other abnormalities of gait and mobility (R26.89);Muscle weakness (generalized) (M62.81)     Time: 8943-8886 PT Time Calculation (min) (ACUTE ONLY): 17 min  Charges:    $Gait Training: 8-22 mins PT General Charges $$ ACUTE PT VISIT: 1 Visit                     Katheryn Leap  PTA Acute  Rehabilitation Services Office M-F          [phone]

## 2023-09-23 DIAGNOSIS — N412 Abscess of prostate: Secondary | ICD-10-CM | POA: Diagnosis not present

## 2023-09-23 DIAGNOSIS — D62 Acute posthemorrhagic anemia: Secondary | ICD-10-CM | POA: Diagnosis not present

## 2023-09-23 LAB — CBC
HCT: 26.5 % — ABNORMAL LOW (ref 39.0–52.0)
Hemoglobin: 8.1 g/dL — ABNORMAL LOW (ref 13.0–17.0)
MCH: 26.2 pg (ref 26.0–34.0)
MCHC: 30.6 g/dL (ref 30.0–36.0)
MCV: 85.8 fL (ref 80.0–100.0)
Platelets: 195 K/uL (ref 150–400)
RBC: 3.09 MIL/uL — ABNORMAL LOW (ref 4.22–5.81)
RDW: 19.8 % — ABNORMAL HIGH (ref 11.5–15.5)
WBC: 11.3 K/uL — ABNORMAL HIGH (ref 4.0–10.5)
nRBC: 0 % (ref 0.0–0.2)

## 2023-09-23 LAB — CULTURE, BLOOD (ROUTINE X 2)
Culture: NO GROWTH
Culture: NO GROWTH

## 2023-09-23 LAB — BASIC METABOLIC PANEL WITH GFR
Anion gap: 7 (ref 5–15)
BUN: 29 mg/dL — ABNORMAL HIGH (ref 8–23)
CO2: 28 mmol/L (ref 22–32)
Calcium: 8.6 mg/dL — ABNORMAL LOW (ref 8.9–10.3)
Chloride: 105 mmol/L (ref 98–111)
Creatinine, Ser: 1.11 mg/dL (ref 0.61–1.24)
GFR, Estimated: >60 mL/min (ref >60)
Glucose, Bld: 90 mg/dL (ref 70–99)
Potassium: 4.3 mmol/L (ref 3.5–5.1)
Sodium: 140 mmol/L (ref 135–145)

## 2023-09-23 MED ORDER — IPRATROPIUM-ALBUTEROL 0.5-2.5 (3) MG/3ML IN SOLN: 3.0000 mL | Freq: Four times a day (QID) | RESPIRATORY_TRACT | Status: DC | PRN

## 2023-09-23 NOTE — Plan of Care (Signed)
  Problem: Clinical Measurements: Goal: Diagnostic test results will improve Outcome: Progressing   Problem: Clinical Measurements: Goal: Respiratory complications will improve Outcome: Progressing   Problem: Clinical Measurements: Goal: Cardiovascular complication will be avoided Outcome: Progressing   Problem: Nutrition: Goal: Adequate nutrition will be maintained Outcome: Progressing   

## 2023-09-23 NOTE — Progress Notes (Signed)
 PROGRESS NOTE   Nathan Shepherd  FMW:[id number]    DOB: 10/11/1955    DOA: 09/18/2023  PCP: Gammon, Chrystal, NP   I have briefly reviewed patients previous medical records in [facility].   Brief Hospital Course:  68 y.o. male, has a legal guardian, with PMH significant for HTN, HLD, CHF, CKD, chronic A-fib on Coumadin , CVA, prior substance abuse. Recently hospitalized twice at [facility].   9/1-9/4 for severe hemorrhoidal bleeding s/p hemorrhoidectomy and discharged back to group home. 9/5 to 9/11 for E. coli UTI, generalized weakness and discharged to SNF. 9/14, presented to [facility] after significant amount of blood loss was noted in his diapers.   In the ED, he was hypotensive. Hemoglobin dropped from 9.6 to 6.7, INR 4.87  CTA showed active GI bleeding in the rectum and extensive inflammatory change surrounding the rectum with rim-enhancing fluid collection involving the right aspect of the prostate that may reflect abscess.  Right IJ central line was placed emergently. Patient was given 2 units PRBCs, Kcentra, vitamin K , IV fluid. Started on broad-spectrum IV antibiotics for possible sepsis. Was transferred from [facility] to [facility] ICU.    While in ICU, blood pressure improved, no further bleeding was noticed.  Patient did not require pressors. 9/16, transferred out to [facility].  On 9/16, transferred from [facility] to [facility] for evaluation by urology and transurethral resection/unroofing of prostatic abscess.  S/p prostate abscess procedure 9/17.  Clinically improved and stable.  Pending hemoglobin stability, should be stable for DC to [facility] 9/21. TOC was updated.   Assessment & Plan:   Hemorrhagic shock Acute on chronic blood loss anemia Secondary to recent hemorrhoidectomy in the context of supratherapeutic INR Required ICU admission, close monitoring, IV fluid resuscitation and shock resolved.  Did not require vasopressors Unclear how many units PRBC  he was transfused thus far this admission.  He presented with hemoglobin of 7.5 (11.2 on 9/9) which dropped to 6.6.   Has been relatively transfusion dependent since hospital admission.  Has received multiple units of blood transfusion (4 units thus far it appears).  No precipitous drop in hemoglobin but hemoglobin had kept drifting down.  Hemoglobin now up from 7-8.1 after transfusion yesterday.  Follow-up CBC in a.m. and pending stability, should be ready for DC to SNF. No evidence of overt bleeding as per extensive discussion with patient's RN, and also patient although he is a poor historian.  EBL at recent urology procedure was reported to be minimal.  If he continues to not maintain his hemoglobin, may need further evaluation.  Recurrent rectal bleeding Severe hemorrhoidal bleeding s/p hemorrhoidectomy 09/05/2023 Initial CTA showed active bleeding in the rectum. However clinically, overt bleeding seems to stopped. As per general surgery input from 9/16, his rectal bleeding was due to supratherapeutic INR (4.87 this admission).  They removed the hemorrhoidal tissue and felt that the anticoagulation at this point was making him more prone to bleeding.   S/p INR reversal with Kcentra and vitamin K  10 mg IV x 1.  Coumadin  currently on hold and will need to reassess risk versus benefit prior to resuming.  INR 1.3 on 9/18.  May have to continue to hold Coumadin  at time of discharge until close outpatient follow-up.  Prostate abscess, E. coli CT pelvis suggested prostate abscess. Dr. Bell/urology consulted and patient was transferred from [facility] to [facility] for surgery S/p transurethral resection/unroofing of prostatic abscess on 9/17.  As per urology follow-up, recommend 2 weeks  of antibiotics post procedure and keep Foley for 10 days.  Can transition to p.o. antibiotics at time of discharge. Continue empirically started IV ciprofloxacin  and metronidazole . Blood cultures negative to date.  Abscess  culture: E. coli sensitive to Cipro .?  DC Flagyl .  Will check with urology.  Chronic A-fib Rate controlled.  Continue carvedilol  3.125 mg twice daily. Currently holding warfarin.  See discussion above regarding resumption  Chronic diastolic CHF HTN Echo 9/15 with EF 55 to 60%, no WMA, moderately dilated LA Blood pressure is well-controlled. PTA meds- Coreg , torsemide , resumed both.  CAD, HLD Restarted statins.   CKD 3A Creatinine at baseline which is not clearly known but may be in the 1.2-1.4 range.  Stable.  Seizure disorder PTA meds- Keppra , Depakote  Continue both.     H/o polysubstance abuse Reports no recent abuse   Dementia Continue Aricept   Mild hypernatremia Resolved.  Mild hyperkalemia Unclear etiology Resolved post Kayexalate.  Body mass index is 24.04 kg/m.   DVT prophylaxis: SCDs Start: 09/18/23 0102     Code Status: Full Code:  Family Communication: Discussed in detail on a conference call with patient's RN, outpatient legal guardian (Ms. Dorn Gelineau) and OP social worker (Ms. Nonda).  Updated care and answered all questions.  None at bedside today. Disposition:  Pending stable hemoglobin, should be ready for DC to SNF on 9/21 pending bed and insurance     Consultants:   General surgery Urology  Procedures:   Prostate surgery by urology on 9/17 LUE PICC line 9/17  Subjective:  Patient denies complaints.  LBM 9/18.  No abdominal pain or distention.  Per nursing, no BM for 2 days and no rectal bleeding.  No other acute issues reported.  Objective:   Vitals:   09/22/23 2111 09/22/23 2113 09/23/23 0630 09/23/23 0757  BP: 110/80 110/80 120/84   Pulse: 86 86 73   Resp:  (!) 22 (!) 24   Temp:  98.2 F (36.8 C) 98.4 F (36.9 C)   TempSrc:  Oral Oral   SpO2:  100% 100% 99%  Weight:      Height:        General exam: Middle-age male, moderately built and chronically ill looking lying comfortably propped up in bed without distress.   Oral mucosa moist. Respiratory system: Clear to auscultation.  No increased work of breathing. Cardiovascular system: S1 & S2 heard, RRR. No JVD, murmurs, rubs, gallops or clicks. No pedal edema.  Telemetry personally reviewed: A-fib with BBB morphology, controlled ventricular rate.  Occasional PVCs.  Discontinued telemetry. Gastrointestinal system: Abdomen is nondistended, soft and nontender. No organomegaly or masses felt. Normal bowel sounds heard. Central nervous system: Alert and oriented x 2 as noted above. No focal neurological deficits. Extremities: Symmetric 5 x 5 power.  LUE PICC line without acute findings. Skin: No rashes, lesions or ulcers Psychiatry: Judgement and insight impaired. Mood & affect flat. GU: Has Foley catheter.    Data Reviewed:   I have personally reviewed following labs and imaging studies   CBC: Recent Labs  Lab 09/20/23 0357 09/20/23 1645 09/21/23 0212 09/21/23 1453 09/22/23 0328 09/22/23 1150 09/23/23 0330  WBC 16.2*   < > 13.2*  --  9.7  --  11.3*  NEUTROABS 12.8*  --  11.0*  --  9.2*  --   --   HGB 6.6*   < > 7.1*   < > 7.0* 9.4* 8.1*  HCT 22.1*   < > 23.1*   < > 23.3* 31.1*  26.5*  MCV 88.4   < > 87.8  --  86.0  --  85.8  PLT 253   < > 215  --  188  --  195   < > = values in this interval not displayed.    Basic Metabolic Panel: Recent Labs  Lab 09/18/23 0140 09/19/23 0918 09/20/23 0357 09/21/23 0212 09/22/23 0328 09/23/23 0330  NA 143  142 146* 146* 141 140 140  K 4.1  4.1 4.8 4.9 5.3* 5.0 4.3  CL 109  109 109 112* 110 109 105  CO2 28  29 26 27 27 26 28   GLUCOSE 117*  116* 113* 92 95 85 90  BUN 36*  37* 40* 34* 27* 28* 29*  CREATININE 1.39*  1.41* 1.40* 1.18 1.11 1.06 1.11  CALCIUM  7.9*  8.0* 8.9 9.0 8.8* 8.5* 8.6*  MG 1.6*  --   --   --   --   --   PHOS 2.9  --   --   --   --   --     Liver Function Tests: Recent Labs  Lab 09/18/23 0140  AST 17  ALT 13  ALKPHOS 40  BILITOT 1.1  PROT 4.6*  ALBUMIN <1.5*     CBG: Recent Labs  Lab 09/18/23 0057  GLUCAP 108*    Microbiology Studies:   Recent Results (from the past 240 hours)  MRSA Next Gen by PCR, Nasal     Status: None   Collection Time: 09/18/23  1:54 AM   Specimen: Nasal Mucosa; Nasal Swab  Result Value Ref Range Status   MRSA by PCR Next Gen NOT DETECTED NOT DETECTED Final    Comment: (NOTE) The GeneXpert MRSA Assay (FDA approved for NASAL specimens only), is one component of a comprehensive MRSA colonization surveillance program. It is not intended to diagnose MRSA infection nor to guide or monitor treatment for MRSA infections. Test performance is not FDA approved in patients less than 75 years old. Performed at [facility], [address], [place], [place] [address]   Culture, blood (Routine X 2) w Reflex to ID Panel     Status: None   Collection Time: 09/18/23  2:22 AM   Specimen: BLOOD LEFT HAND  Result Value Ref Range Status   Specimen Description BLOOD LEFT HAND  Final   Special Requests   Final    BOTTLES DRAWN AEROBIC ONLY Blood Culture results may not be optimal due to an inadequate volume of blood received in culture bottles   Culture   Final    NO GROWTH 5 DAYS Performed at [facility], [address] [place], [place] [address]    Report Status 09/23/2023 FINAL  Final  Culture, blood (Routine X 2) w Reflex to ID Panel     Status: None   Collection Time: 09/18/23  2:22 AM   Specimen: BLOOD LEFT HAND  Result Value Ref Range Status   Specimen Description BLOOD LEFT HAND  Final   Special Requests   Final    BOTTLES DRAWN AEROBIC ONLY Blood Culture results may not be optimal due to an inadequate volume of blood received in culture bottles   Culture   Final    NO GROWTH 5 DAYS Performed at [facility], [address] [place], [place] [address]    Report Status 09/23/2023 FINAL  Final  Aerobic/Anaerobic Culture w Gram Stain (surgical/deep wound)     Status: None (Preliminary result)  Collection Time: 09/20/23  2:32 PM   Specimen: Wound; Abscess  Result Value Ref Range Status   Specimen Description ABSCESS  Final   Special Requests PROSTATE  Final   Gram Stain   Final    FEW WBC PRESENT, PREDOMINANTLY PMN NO ORGANISMS SEEN Performed at [facility], [address] [place], [place] [address]    Culture   Final    RARE ESCHERICHIA COLI NO ANAEROBES ISOLATED; CULTURE IN PROGRESS FOR 5 DAYS    Report Status PENDING  Incomplete   Organism ID, Bacteria ESCHERICHIA COLI  Final      Susceptibility   Escherichia coli - MIC*    AMPICILLIN >=32 RESISTANT Resistant     CEFAZOLIN (NON-URINE) 4 INTERMEDIATE Intermediate     CEFEPIME  <=0.12 SENSITIVE Sensitive     ERTAPENEM <=0.12 SENSITIVE Sensitive     CEFTRIAXONE  <=0.25 SENSITIVE Sensitive     CIPROFLOXACIN  <=0.06 SENSITIVE Sensitive     GENTAMICIN <=1 SENSITIVE Sensitive     MEROPENEM <=0.25 SENSITIVE Sensitive     TRIMETH/SULFA <=20 SENSITIVE Sensitive     AMPICILLIN/SULBACTAM 16 INTERMEDIATE Intermediate     PIP/TAZO Value in next row Sensitive      <=4 SENSITIVEThis is a modified FDA-approved test that has been validated and its performance characteristics determined by the reporting laboratory.  This laboratory is certified under the Clinical Laboratory Improvement Amendments CLIA as qualified to perform high complexity clinical laboratory testing.    * RARE ESCHERICHIA COLI    Radiology Studies:  No results found.   Scheduled Meds:    carvedilol   3.125 mg Oral BID   Chlorhexidine  Gluconate Cloth  6 each Topical Daily   divalproex   750 mg Oral Q12H   donepezil   5 mg Oral QHS   feeding supplement  237 mL Oral TID BM   fluticasone  furoate-vilanterol  1 puff Inhalation Daily   levETIRAcetam   1,000 mg Oral BID   multivitamin with minerals  1 tablet Oral Daily   pantoprazole   40 mg Oral Daily   rosuvastatin   20 mg Oral QHS   torsemide   20 mg Oral q morning    Continuous Infusions:    ciprofloxacin   400 mg (09/23/23 0903)   metronidazole  Stopped (09/22/23 2254)     LOS: 5 days     Trenda Mar, MD,  FACP, [facility], [facility], [facility]   [facility] [place]      To contact the attending provider between 7A-7P or the covering provider during after hours 7P-7A, please log into the web site [email or web address] and access using universal [place] password for that web site. If you do not have the password, please call the hospital operator.  09/23/2023, 11:28 AM

## 2023-09-23 NOTE — TOC Progression Note (Addendum)
 Transition of Care [facility]) - Progression Note    Patient Details  Name: Nathan Shepherd MRN: [id number] Date of Birth: 1955/08/16  Transition of Care [facility]) CM/SW Contact  Lorraine LILLETTE [place], [place] Phone Number: 09/23/2023, 1:57 PM  Clinical Narrative:    [place] from MD- anticipates that pt may be ready for DC tomorrow and verifying if Sunday DC possible. Record review indicates that [facility] states fax DC summary and weekend return is possible, pt ALF resident.  Addendum- Record review- pt arrived from [place] and will return there to continue SNF- CSW called Isaiah to confirm placement can be tomorrow.  Pt is traditional Medicare no auth needed. ICM following.       Expected Discharge Plan: Skilled Nursing Facility Barriers to Discharge: Continued Medical Work up               Expected Discharge Plan and Services In-house Referral: Clinical Social Work     Living arrangements for the past 2 months: Assisted Living Facility, Marketing executive, Skilled Nursing Facility                                       Social Drivers of Health (SDOH) Interventions SDOH Screenings   Food Insecurity: No Food Insecurity (09/19/2023)  Housing: Low Risk  (09/19/2023)  Transportation Needs: No Transportation Needs (09/19/2023)  Utilities: Not At Risk (09/19/2023)  Social Connections: Socially Isolated (09/19/2023)  Tobacco Use: Low Risk  (09/19/2023)    Readmission Risk Interventions    09/12/2023    7:53 AM  Readmission Risk Prevention Plan  Transportation Screening Complete  HRI or Home Care Consult Complete  Social Work Consult for Recovery Care Planning/Counseling Complete  Palliative Care Screening Not Applicable  Medication Review Oceanographer) Complete

## 2023-09-23 NOTE — Plan of Care (Signed)

## 2023-09-24 DIAGNOSIS — R578 Other shock: Secondary | ICD-10-CM | POA: Diagnosis not present

## 2023-09-24 DIAGNOSIS — K625 Hemorrhage of anus and rectum: Secondary | ICD-10-CM | POA: Diagnosis not present

## 2023-09-24 DIAGNOSIS — N412 Abscess of prostate: Secondary | ICD-10-CM | POA: Diagnosis not present

## 2023-09-24 LAB — CBC
HCT: 27.6 % — ABNORMAL LOW (ref 39.0–52.0)
Hemoglobin: 8.5 g/dL — ABNORMAL LOW (ref 13.0–17.0)
MCH: 26.2 pg (ref 26.0–34.0)
MCHC: 30.8 g/dL (ref 30.0–36.0)
MCV: 84.9 fL (ref 80.0–100.0)
Platelets: 188 K/uL (ref 150–400)
RBC: 3.25 MIL/uL — ABNORMAL LOW (ref 4.22–5.81)
RDW: 19.6 % — ABNORMAL HIGH (ref 11.5–15.5)
WBC: 10.4 K/uL (ref 4.0–10.5)
nRBC: 0 % (ref 0.0–0.2)

## 2023-09-24 MED ORDER — CIPROFLOXACIN HCL 500 MG PO TABS: 500.0000 mg | ORAL_TABLET | Freq: Two times a day (BID) | ORAL | Status: AC

## 2023-09-24 MED ORDER — METRONIDAZOLE 500 MG PO TABS: 500.0000 mg | ORAL_TABLET | Freq: Three times a day (TID) | ORAL | Status: AC

## 2023-09-24 MED ORDER — ACETAMINOPHEN 325 MG PO TABS: 650.0000 mg | ORAL_TABLET | Freq: Four times a day (QID) | ORAL | Status: DC | PRN

## 2023-09-24 MED ORDER — ADULT MULTIVITAMIN W/MINERALS CH: 1.0000 | ORAL_TABLET | Freq: Every day | ORAL | Status: DC

## 2023-09-24 NOTE — Discharge Instructions (Signed)

## 2023-09-24 NOTE — TOC Progression Note (Signed)
 Transition of Care [facility]) - Progression Note    Patient Details  Name: Nathan Shepherd MRN: [id number] Date of Birth: 04/29/1955  Transition of Care [facility]) CM/SW Contact  Lorraine LILLETTE [place], [place] Phone Number: 09/24/2023, 3:25 PM  Clinical Narrative:    Pt cleared for DC- Summary written, CSW called facility confirmed return.  DS pulled form Epic and reviewed.  CSW faxed DS to [facility] as requested.   Call report info given to nurse, and [place] form completed and pt added to the transport list.  No other TOC needs at this time.    Expected Discharge Plan: Skilled Nursing Facility Barriers to Discharge: No Barriers Identified               Expected Discharge Plan and Services In-house Referral: Clinical Social Work     Living arrangements for the past 2 months: Assisted Living Facility, Independent Living Facility, Skilled Nursing Facility Expected Discharge Date: 09/24/23                                     Social Drivers of Health (SDOH) Interventions SDOH Screenings   Food Insecurity: No Food Insecurity (09/19/2023)  Housing: Low Risk  (09/19/2023)  Transportation Needs: No Transportation Needs (09/19/2023)  Utilities: Not At Risk (09/19/2023)  Social Connections: Socially Isolated (09/19/2023)  Tobacco Use: Low Risk  (09/19/2023)    Readmission Risk Interventions    09/12/2023    7:53 AM  Readmission Risk Prevention Plan  Transportation Screening Complete  HRI or Home Care Consult Complete  Social Work Consult for Recovery Care Planning/Counseling Complete  Palliative Care Screening Not Applicable  Medication Review Oceanographer) Complete

## 2023-09-24 NOTE — Discharge Summary (Addendum)
 Physician Discharge Summary  Nathan Shepherd FMW:[id number] DOB: December 26, 1955  PCP: Gammon, Chrystal, NP  Admitted from: SNF Discharged to: SNF  Admit date: 09/18/2023 Discharge date: 09/24/2023  Recommendations for Outpatient Follow-up:    Follow-up Information     MD at SNF. Schedule an appointment as soon as possible for a visit.   Why: To be seen in 2 to 3 days with repeat labs (CBC & BMP).  Thereafter follow-up CBC twice weekly to ensure stability.  Patient must follow-up with urology 10 days postop.  DC Foley catheter 10 days post op.  Decision to be made regarding resumption of anticoagulation in 2 weeks.        Gammon, Chrystal, NP. Schedule an appointment as soon as possible for a visit.   Specialty: Nurse Practitioner Contact information: [address] [place] [address] [phone]         Carolee Sherwood JONETTA DOUGLAS, MD. Schedule an appointment as soon as possible for a visit in 1 week(s).   Specialty: Urology Contact information: [address] [place] [address] [phone]         [name] Manuelita BROCKS, MD. Schedule an appointment as soon as possible for a visit in 1 week(s).   Specialty: General Surgery Contact information: [address] [place] [facility] [address] [phone]                  Home Health: None    Equipment/Devices: TBD at SNF    Discharge Condition: Improved and stable.   Code Status: Full Code Diet recommendation:  Discharge Diet Orders (From admission, onward)     Start     Ordered   09/24/23 0000  Diet - low sodium heart healthy        09/24/23 1341             Discharge Diagnoses:  Principal Problem:   Abscess Active Problems:   ABLA (acute blood loss anemia)   Protein-calorie malnutrition, severe   Brief Hospital Course:  68 y.o. male, has a legal guardian, ? SNF resident, with PMH significant for HTN, HLD, CHF, CAD, CKD, chronic A-fib on Coumadin , CVA, seizure disorder, prior substance  abuse. Recently hospitalized twice : 9/1-9/4 for severe hemorrhoidal bleeding s/p hemorrhoidectomy on 09/05/2023 by Dr. Morna [name] and discharged back to facility, 9/5 to 9/11 for E. coli UTI, generalized weakness and discharged to SNF. 9/14, presented to [facility] after significant amount of blood loss was noted in his diapers.   In the ED, he was hypotensive. Hemoglobin dropped from 9.6 to 6.7, INR 4.87  CTA showed active GI bleeding in the rectum and extensive inflammatory change surrounding the rectum with rim-enhancing fluid collection involving the right aspect of the prostate that may reflect abscess.  Right IJ central line was placed emergently. Patient was given 2 units PRBCs, Kcentra, vitamin K , IV fluid. Started on broad-spectrum IV antibiotics for possible sepsis. Was transferred from [facility] to [facility] ICU.    While in ICU, blood pressure improved, no further bleeding was noticed.  Patient did not require pressors. 9/16, transferred out to [facility].  On 9/16, transferred from [facility] to [facility] for evaluation by urology.  S/p transurethral resection of the prostate/unroofing of prostatic abscess 9/17.   Patient without evidence of overt rectal bleeding for several days, post multiple units of PRBC transfusions, hemoglobin has stabilized.     Assessment & Plan:    Hemorrhagic shock Acute on chronic blood loss anemia Secondary to recent  hemorrhoidectomy in the context of supratherapeutic INR Required ICU admission, close monitoring, IV fluid resuscitation and shock resolved.  Did not require vasopressors He presented with hemoglobin of 7.5 (11.2 on 9/9) which dropped to 6.6.   He received multiple units of blood transfusion (4 units thus far it appears).  Hemoglobin has finally stabilized over the last 2 days. No evidence of overt bleeding. Follow CBCs closely at SNF.   Recurrent rectal bleeding Severe hemorrhoidal bleeding s/p hemorrhoidectomy 09/05/2023 Initial CTA  showed active bleeding in the rectum. As per general surgery input from 9/16, his rectal bleeding was due to supratherapeutic INR (4.87 this admission).  They removed the hemorrhoidal tissue and felt that the anticoagulation at this point was making him more prone to bleeding.  They suggested to consider alternate option for anticoagulation if possible given repeat supratherapeutic INR's. S/p INR reversal with Kcentra and vitamin K  10 mg IV x 1.  Coumadin  currently on hold and will need to reassess risk versus benefit prior to resuming.  INR 1.3 on 9/18.   Discussed with general surgeon on-call on day of discharge who recommends holding all anticoagulation for additional 2 weeks given severity of his presentation with hemorrhagic shock from acute blood loss anemia, then consider resumption with careful monitoring. Recommend transitioning from warfarin to a DOAC at that time to avoid fluctuations in INR and related bleeding complications.  Consider consultation with patient's primary cardiologist or PCP.  Anticoagulation has been on hold since this admission.   Prostate abscess, E. Coli Sepsis ruled out. CT pelvis suggested prostate abscess. Dr. Bell/urology was consulted. S/p transurethral resection/unroofing of prostatic abscess on 9/17.  As per urology follow-up 9/18, recommend 2 weeks of antibiotics post procedure and keep Foley for 10 days.   In the hospital, patient was treated empirically with IV ciprofloxacin  and metronidazole . Blood cultures negative to date.  Abscess culture: E. coli sensitive to Cipro . Communicated with infectious disease MD on-call on day of discharge who recommends continuing Cipro  and Flagyl .  Complete additional 10 days of Cipro  and Flagyl  through 10/04/2023 (total 14 days postprocedure) and then discontinue. Discontinue Foley catheter on 9/28 and monitor for voiding. Outpatient follow-up with Dr. Carolee, Urology in 1 week, SNF to coordinate.   Chronic A-fib Rate  controlled.  Continue carvedilol  3.125 mg twice daily. See detailed anticoagulation discussion above.   Chronic diastolic CHF HTN Echo 9/15 with EF 55 to 60%, no WMA, moderately dilated LA Blood pressure is well-controlled. Continue carvedilol  and torsemide .  Clinically euvolemic.   CAD, HLD Continue statins.   CKD 3A Creatinine at baseline which is not clearly known but may be in the 1.2-1.4 range.  Stable.   Seizure disorder Continue Keppra , Depakote    H/o polysubstance abuse Reports no recent abuse   Dementia Continue Aricept    Mild hypernatremia Resolved.   Mild hyperkalemia Unclear etiology Resolved post Kayexalate.  Severe malnutrition in context of chronic illness  Evaluation and management as per registered dietitian note from 9/15 appreciated and management as per their recommendations.   Body mass index is 24.04 kg/m.      Consultants:   General surgery Urology   Procedures:   Prostate surgery by urology on 9/17 LUE PICC line 9/17-removed on day of discharge.   Discharge Instructions  Discharge Instructions     (HEART FAILURE PATIENTS) Call MD:  Anytime you have any of the following symptoms: 1) 3 pound weight gain in 24 hours or 5 pounds in 1 week 2) shortness of breath, with  or without a dry hacking cough 3) swelling in the hands, feet or stomach 4) if you have to sleep on extra pillows at night in order to breathe.   Complete by: As directed    Call MD for:   Complete by: As directed    Recurrent rectal bleeding.   Call MD for:  difficulty breathing, headache or visual disturbances   Complete by: As directed    Call MD for:  extreme fatigue   Complete by: As directed    Call MD for:  persistant dizziness or light-headedness   Complete by: As directed    Call MD for:  persistant nausea and vomiting   Complete by: As directed    Call MD for:  severe uncontrolled pain   Complete by: As directed    Call MD for:  temperature >100.4   Complete  by: As directed    Diet - low sodium heart healthy   Complete by: As directed    Discharge instructions   Complete by: As directed    Discontinue Foley catheter on 09/30/2023 and monitor for voiding.   Increase activity slowly   Complete by: As directed    No wound care   Complete by: As directed         Medication List     STOP taking these medications    loperamide  2 MG capsule Commonly known as: IMODIUM    warfarin 5 MG tablet Commonly known as: COUMADIN        TAKE these medications    acetaminophen  325 MG tablet Commonly known as: TYLENOL  Take 2 tablets (650 mg total) by mouth every 6 (six) hours as needed for mild pain (pain score 1-3), moderate pain (pain score 4-6) or fever. What changed:  when to take this reasons to take this   aluminum-magnesium  hydroxide-simethicone 200-200-20 MG/5ML Susp Commonly known as: MAALOX Take 30 mLs by mouth every 6 (six) hours as needed (indigestion, heartburn).   carvedilol  3.125 MG tablet Commonly known as: Coreg  Take 1 tablet (3.125 mg total) by mouth 2 (two) times daily.   ciprofloxacin  500 MG tablet Commonly known as: Cipro  Take 1 tablet (500 mg total) by mouth 2 (two) times daily for 10 days.   diphenhydrAMINE  25 MG tablet Commonly known as: BENADRYL  Take 25 mg by mouth every 6 (six) hours as needed for allergies or itching.   divalproex  250 MG DR tablet Commonly known as: DEPAKOTE  Take 3 tablets (750 mg total) by mouth every 12 (twelve) hours.   donepezil  5 MG tablet Commonly known as: ARICEPT  Take 5 mg by mouth at bedtime.   eucerin lotion Apply 1 Application topically daily. apply to bilateral legs, feet   levETIRAcetam  1000 MG tablet Commonly known as: KEPPRA  Take 1,000 mg by mouth 2 (two) times daily.   metroNIDAZOLE  500 MG tablet Commonly known as: FLAGYL  Take 1 tablet (500 mg total) by mouth 3 (three) times daily for 10 days.   multivitamin with minerals Tabs tablet Take 1 tablet by mouth  daily. Start taking on: September 25, 2023   Nutritional Drink Liqd Take 1 Container by mouth 2 (two) times daily after a meal. Med Pass 2.0   omeprazole  20 MG capsule Commonly known as: PRILOSEC Take 1 capsule (20 mg total) by mouth 2 (two) times daily before a meal. Take 30 min before breakfast and 30 min before dinner   ondansetron  4 MG tablet Commonly known as: ZOFRAN  Take 1 tablet (4 mg total) by mouth every 6 (six)  hours as needed for nausea.   polyethylene glycol 17 g packet Commonly known as: MiraLax  Take 17 g by mouth every Monday, Wednesday, and Friday.   PROTEIN PO Take 30 mLs by mouth 2 (two) times daily.   rosuvastatin  20 MG tablet Commonly known as: CRESTOR  Take 20 mg by mouth at bedtime.   senna-docusate 8.6-50 MG tablet Commonly known as: Senokot-S Take 2 tablets by mouth at bedtime.   Symbicort  160-4.5 MCG/ACT inhaler Generic drug: budesonide -formoterol  Inhale 2 puffs into the lungs in the morning and at bedtime.   torsemide  20 MG tablet Commonly known as: DEMADEX  Take 20 mg by mouth every morning.   Vitamin D3 1.25 MG (50000 UT) Caps Take 50,000 Units by mouth every Monday.       Allergies  Allergen Reactions   Penicillins Hives    Tolerated Zosyn       Procedures/Studies: DG CHEST PORT 1 VIEW Result Date: 09/20/2023 CLINICAL DATA:  [id number] PICC (peripherally inserted central catheter) in place [id number] EXAM: PORTABLE CHEST 1 VIEW COMPARISON:  CT chest 08/21/2021, chest x-ray 09/17/2023 FINDINGS: Left PICC insertion with tip just distal to the expected superior cavoatrial junction. Cardiomegaly. Otherwise the heart and mediastinal contours are within normal limits. Left lung base collimated off view. No focal consolidation. No pulmonary edema. No pleural effusion. No pneumothorax. No acute osseous abnormality. IMPRESSION: No active disease. Electronically Signed   By: Morgane  Naveau M.D.   On: 09/20/2023 20:45   US  EKG SITE RITE Result Date:  09/20/2023 If Site Rite image not attached, placement could not be confirmed due to current cardiac rhythm.  DG CHEST PORT 1 VIEW Result Date: 09/20/2023 CLINICAL DATA:  Preoperative respiratory evaluation. EXAM: PORTABLE CHEST 1 VIEW COMPARISON:  09/17/2023 FINDINGS: Low volume film. The cardio pericardial silhouette is enlarged. Mild asymmetric elevation left hemidiaphragm. Right IJ central line seen previously has been removed in the interval. Prominent skin fold noted over the right upper lobe. The lungs are clear without focal pneumonia, edema, pneumothorax or pleural effusion. Telemetry leads overlie the chest. IMPRESSION: Low volume film without acute cardiopulmonary findings. Electronically Signed   By: Camellia Candle M.D.   On: 09/20/2023 07:47   ECHOCARDIOGRAM COMPLETE Result Date: 09/18/2023    ECHOCARDIOGRAM REPORT   Patient Name:   COLLIN RENGEL [facility] Date of Exam: 09/18/2023 Medical Rec #:  [id number]             Height:       76.0 in Accession #:    [id number]            Weight:       171.7 lb Date of Birth:  15-Mar-1955              BSA:          2.076 m Patient Age:    68 years              BP:           138/98 mmHg Patient Gender: M                     HR:           83 bpm. Exam Location:  Inpatient Procedure: 2D Echo (Both Spectral and Color Flow Doppler were utilized during            procedure). Indications:    Afib  History:        Patient has prior history of Echocardiogram examinations.  Arrythmias:Atrial Fibrillation.  Sonographer:    Norleen Amour Referring Phys: [id number] OMAR M ALBUSTAMI IMPRESSIONS  1. Left ventricular ejection fraction, by estimation, is 55 to 60%. The left ventricle has normal function. The left ventricle has no regional wall motion abnormalities. There is mild left ventricular hypertrophy. Left ventricular diastolic function could not be evaluated.  2. Right ventricular systolic function is mildly reduced. The right ventricular size is normal.  3. Left  atrial size was moderately dilated.  4. Right atrial size was mildly dilated.  5. The mitral valve is normal in structure. No evidence of mitral valve regurgitation. No evidence of mitral stenosis.  6. The aortic valve is normal in structure. There is mild calcification of the aortic valve. Aortic valve regurgitation is trivial. No aortic stenosis is present.  7. The inferior vena cava is normal in size with greater than 50% respiratory variability, suggesting right atrial pressure of 3 mmHg. FINDINGS  Left Ventricle: Left ventricular ejection fraction, by estimation, is 55 to 60%. The left ventricle has normal function. The left ventricle has no regional wall motion abnormalities. The left ventricular internal cavity size was normal in size. There is  mild left ventricular hypertrophy. Left ventricular diastolic function could not be evaluated due to atrial fibrillation. Left ventricular diastolic function could not be evaluated. Right Ventricle: The right ventricular size is normal. No increase in right ventricular wall thickness. Right ventricular systolic function is mildly reduced. Left Atrium: Left atrial size was moderately dilated. Right Atrium: Right atrial size was mildly dilated. Pericardium: Trivial pericardial effusion is present. Mitral Valve: The mitral valve is normal in structure. No evidence of mitral valve regurgitation. No evidence of mitral valve stenosis. Tricuspid Valve: The tricuspid valve is normal in structure. Tricuspid valve regurgitation is trivial. No evidence of tricuspid stenosis. Aortic Valve: The aortic valve is normal in structure. There is mild calcification of the aortic valve. Aortic valve regurgitation is trivial. No aortic stenosis is present. Pulmonic Valve: The pulmonic valve was normal in structure. Pulmonic valve regurgitation is not visualized. No evidence of pulmonic stenosis. Aorta: The aortic root is normal in size and structure. Venous: The inferior vena cava is  normal in size with greater than 50% respiratory variability, suggesting right atrial pressure of 3 mmHg. IAS/Shunts: No atrial level shunt detected by color flow Doppler.  LEFT VENTRICLE PLAX 2D LVIDd:         4.20 cm     Diastology LVIDs:         2.70 cm     LV e' medial:    9.32 cm/s LV PW:         1.30 cm     LV E/e' medial:  6.3 LV IVS:        1.20 cm     LV e' lateral:   12.30 cm/s LVOT diam:     2.10 cm     LV E/e' lateral: 4.8 LV SV:         38 LV SV Index:   19 LVOT Area:     3.46 cm  LV Volumes (MOD) LV vol d, MOD A2C: 52.9 ml LV vol d, MOD A4C: 64.5 ml LV vol s, MOD A2C: 31.3 ml LV vol s, MOD A4C: 28.6 ml LV SV MOD A2C:     21.6 ml LV SV MOD A4C:     64.5 ml LV SV MOD BP:      27.9 ml RIGHT VENTRICLE RV Basal diam:  2.00 cm RV S  prime:     5.98 cm/s TAPSE (M-mode): 1.5 cm LEFT ATRIUM              Index        RIGHT ATRIUM           Index LA diam:        4.70 cm  2.26 cm/m   RA Area:     18.80 cm LA Vol (A2C):   119.0 ml 57.31 ml/m  RA Volume:   42.70 ml  20.57 ml/m LA Vol (A4C):   56.7 ml  27.31 ml/m LA Biplane Vol: 87.8 ml  42.29 ml/m  AORTIC VALVE LVOT Vmax:   76.83 cm/s LVOT Vmean:  48.067 cm/s LVOT VTI:    0.111 m  AORTA Ao Root diam: 2.90 cm Ao Asc diam:  3.10 cm MV E velocity: 59.17 cm/s  TRICUSPID VALVE                            TR Peak grad:   23.0 mmHg                            TR Vmax:        240.00 cm/s                             SHUNTS                            Systemic VTI:  0.11 m                            Systemic Diam: 2.10 cm Aditya Sabharwal Electronically signed by Ria Commander Signature Date/Time: 09/18/2023/1:00:14 PM    Final     Subjective: Denies complaints.  Had BM today.  No report of rectal bleeding from patient or nursing for several days today.  Tolerating diet.  No pain.  No dyspnea or chest pain.  Discharge Exam:  Vitals:   09/23/23 2001 09/23/23 2114 09/24/23 0500 09/24/23 0505  BP: 114/89 114/89  122/79  Pulse: 84 84  76  Resp: 18   16  Temp:  99.2 F (37.3 C)   (!) 97.5 F (36.4 C)  TempSrc: Oral   Oral  SpO2: 100%   100%  Weight:   89.8 kg   Height:        General exam: Middle-age male, moderately built and chronically ill looking lying comfortably propped up in bed without distress.  Oral mucosa moist. Respiratory system: Clear to auscultation.  No increased work of breathing. Cardiovascular system: S1 & S2 heard, RRR. No JVD, murmurs, rubs, gallops or clicks. No pedal edema.  Telemetry personally reviewed: A-fib with BBB morphology, controlled ventricular rate.  Off telemetry. Gastrointestinal system: Abdomen is nondistended, soft and nontender. No organomegaly or masses felt. Normal bowel sounds heard. Central nervous system: Alert and oriented x 2 as noted above. No focal neurological deficits. Extremities: Symmetric 5 x 5 power.  LUE PICC line without acute findings-will be removed prior to discharge. Skin: No rashes, lesions or ulcers Psychiatry: Judgement and insight impaired. Mood & affect flat. GU: Has Foley catheter.    The results of significant diagnostics from this hospitalization (including imaging, microbiology, ancillary and laboratory) are listed below for reference.  Microbiology: Recent Results (from the past 240 hours)  MRSA Next Gen by PCR, Nasal     Status: None   Collection Time: 09/18/23  1:54 AM   Specimen: Nasal Mucosa; Nasal Swab  Result Value Ref Range Status   MRSA by PCR Next Gen NOT DETECTED NOT DETECTED Final    Comment: (NOTE) The GeneXpert MRSA Assay (FDA approved for NASAL specimens only), is one component of a comprehensive MRSA colonization surveillance program. It is not intended to diagnose MRSA infection nor to guide or monitor treatment for MRSA infections. Test performance is not FDA approved in patients less than 53 years old. Performed at [facility], [address] [place], [place] [address]   Culture, blood (Routine X 2) w Reflex to ID Panel     Status: None    Collection Time: 09/18/23  2:22 AM   Specimen: BLOOD LEFT HAND  Result Value Ref Range Status   Specimen Description BLOOD LEFT HAND  Final   Special Requests   Final    BOTTLES DRAWN AEROBIC ONLY Blood Culture results may not be optimal due to an inadequate volume of blood received in culture bottles   Culture   Final    NO GROWTH 5 DAYS Performed at [facility], [address], [place], [place] [address]    Report Status 09/23/2023 FINAL  Final  Culture, blood (Routine X 2) w Reflex to ID Panel     Status: None   Collection Time: 09/18/23  2:22 AM   Specimen: BLOOD LEFT HAND  Result Value Ref Range Status   Specimen Description BLOOD LEFT HAND  Final   Special Requests   Final    BOTTLES DRAWN AEROBIC ONLY Blood Culture results may not be optimal due to an inadequate volume of blood received in culture bottles   Culture   Final    NO GROWTH 5 DAYS Performed at [facility], [address], [place], [place] [address]    Report Status 09/23/2023 FINAL  Final  Aerobic/Anaerobic Culture w Gram Stain (surgical/deep wound)     Status: None (Preliminary result)   Collection Time: 09/20/23  2:32 PM   Specimen: Wound; Abscess  Result Value Ref Range Status   Specimen Description ABSCESS  Final   Special Requests PROSTATE  Final   Gram Stain   Final    FEW WBC PRESENT, PREDOMINANTLY PMN NO ORGANISMS SEEN Performed at [facility], [address], [place], [place] [address]    Culture   Final    RARE ESCHERICHIA COLI NO ANAEROBES ISOLATED; CULTURE IN PROGRESS FOR 5 DAYS    Report Status PENDING  Incomplete   Organism ID, Bacteria ESCHERICHIA COLI  Final      Susceptibility   Escherichia coli - MIC*    AMPICILLIN >=32 RESISTANT Resistant     CEFAZOLIN (NON-URINE) 4 INTERMEDIATE Intermediate     CEFEPIME  <=0.12 SENSITIVE Sensitive     ERTAPENEM <=0.12 SENSITIVE Sensitive     CEFTRIAXONE  <=0.25 SENSITIVE Sensitive     CIPROFLOXACIN  <=0.06 SENSITIVE Sensitive      GENTAMICIN <=1 SENSITIVE Sensitive     MEROPENEM <=0.25 SENSITIVE Sensitive     TRIMETH/SULFA <=20 SENSITIVE Sensitive     AMPICILLIN/SULBACTAM 16 INTERMEDIATE Intermediate     PIP/TAZO Value in next row Sensitive      <=4 SENSITIVEThis is a modified FDA-approved test that has been validated and its performance characteristics determined by the reporting laboratory.  This laboratory  is certified under the Clinical Laboratory Improvement Amendments CLIA as qualified to perform high complexity clinical laboratory testing.    * RARE ESCHERICHIA COLI     Labs: CBC: Recent Labs  Lab 09/20/23 0357 09/20/23 1645 09/20/23 2131 09/21/23 0212 09/21/23 1453 09/22/23 0328 09/22/23 1150 09/23/23 0330 09/24/23 0339  WBC 16.2*   < > 16.0* 13.2*  --  9.7  --  11.3* 10.4  NEUTROABS 12.8*  --   --  11.0*  --  9.2*  --   --   --   HGB 6.6*   < > 7.6* 7.1* 7.4* 7.0* 9.4* 8.1* 8.5*  HCT 22.1*   < > 24.8* 23.1* 23.9* 23.3* 31.1* 26.5* 27.6*  MCV 88.4   < > 86.4 87.8  --  86.0  --  85.8 84.9  PLT 253   < > 274 215  --  188  --  195 188   < > = values in this interval not displayed.    Basic Metabolic Panel: Recent Labs  Lab 09/18/23 0140 09/19/23 0918 09/20/23 0357 09/21/23 0212 09/22/23 0328 09/23/23 0330  NA 143  142 146* 146* 141 140 140  K 4.1  4.1 4.8 4.9 5.3* 5.0 4.3  CL 109  109 109 112* 110 109 105  CO2 28  29 26 27 27 26 28   GLUCOSE 117*  116* 113* 92 95 85 90  BUN 36*  37* 40* 34* 27* 28* 29*  CREATININE 1.39*  1.41* 1.40* 1.18 1.11 1.06 1.11  CALCIUM  7.9*  8.0* 8.9 9.0 8.8* 8.5* 8.6*  MG 1.6*  --   --   --   --   --   PHOS 2.9  --   --   --   --   --     Liver Function Tests: Recent Labs  Lab 09/18/23 0140  AST 17  ALT 13  ALKPHOS 40  BILITOT 1.1  PROT 4.6*  ALBUMIN <1.5*    CBG: Recent Labs  Lab 09/18/23 0057  GLUCAP 108*      Time coordinating discharge: 45 minutes  SIGNED:  Trenda Mar, MD,  FACP, [facility], [facility], [facility]   [facility]  Hospitalist & Physician Advisor [place]     To contact the attending provider between 7A-7P or the covering provider during after hours 7P-7A, please log into the web site [email or web address] and access using universal [place] password for that web site. If you do not have the password, please call the hospital operator.

## 2023-09-24 NOTE — Plan of Care (Signed)

## 2023-09-25 LAB — TYPE AND SCREEN
ABO/RH(D): B POS
Antibody Screen: NEGATIVE
Unit division: 0
Unit division: 0
Unit division: 0

## 2023-09-25 LAB — AEROBIC/ANAEROBIC CULTURE W GRAM STAIN (SURGICAL/DEEP WOUND)

## 2023-09-25 LAB — BPAM RBC
Blood Product Expiration Date: 202510162359
Blood Product Expiration Date: 202510182359
Blood Product Expiration Date: 202510182359
ISSUE DATE / TIME: 202509171026
ISSUE DATE / TIME: 202509180938
ISSUE DATE / TIME: 202509190624
Unit Type and Rh: 7300
Unit Type and Rh: 7300
Unit Type and Rh: 7300

## 2023-10-10 ENCOUNTER — Ambulatory Visit: Admitting: General Surgery

## 2023-10-10 ENCOUNTER — Encounter: Payer: Self-pay | Admitting: General Surgery

## 2023-10-10 VITALS — BP 122/89 | HR 95 | Temp 98.0°F | Resp 16 | Ht 76.0 in | Wt 198.0 lb

## 2023-10-10 DIAGNOSIS — K643 Fourth degree hemorrhoids: Secondary | ICD-10-CM

## 2023-10-10 NOTE — Progress Notes (Signed)
 [facility] Note   HPI:  68 y.o. Male presents to clinic for post-op follow-up evaluation after hemorrhoidectomy. Patient reports he has no pain or issues with Bms. He has had a complicated picture after the 9/2 hemorrhoidectomy. He was discharged without bleeding 9/4 and then he presented with some bleeding but stable H&H to [facility] ED on 9/5.  He then was admitted with UTI and was started on antibiotics. He was discharged on his coumadin  on 9/11. He was then admitted with bleeding and worsening sepsis from [facility] on 9/15 and imaging concerning for prostatic abscess and had drainage with Dr. Carolee. His INR was supra-therapeutic again on that admission. His coumadin  was held at this discharge on 9/21 given the continued issues with bleeding on and off and his supra-therapeutic INR.   He lives at a facility. He reports no issues today. He is very weak and in the wheel chair. He no longer has a catheter and it looks like he was suppose to follow up with Dr. Carolee 10 days after the 9/21 dc.   Review of Systems:  No bleeding reported Weakness No rectal pain All other review of systems: otherwise negative   Vital Signs:  BP 122/89   Pulse 95   Temp 98 F (36.7 C) (Oral)   Resp 16   Ht 6' 4 (1.93 m)   Wt 198 lb (89.8 kg)   SpO2 95%   BMI 24.10 kg/m    Physical Exam:  Physical Exam Vitals reviewed.  Cardiovascular:     Rate and Rhythm: Normal rate.  Pulmonary:     Effort: Pulmonary effort is normal.  Abdominal:     General: There is no distension.     Palpations: Abdomen is soft.     Tenderness: There is no abdominal tenderness.  Genitourinary:    Comments: Healing perianal tissue with no hemorrhoids, no tenderness, no drainage, no bleeding  Neurological:     Mental Status: He is alert.      Assessment:  68 y.o. yo Male s/p hemorrhoidectomy for bleeding complicated by more bleeding due to supra-therapeutic INR. He has had 5 supra-therapeutic INRs in  the past year in our system. He is still not back on blood thinners. It looks like the blood thinner is for A fib and history of stroke.  He had a UTI and then prostatic abscess that has been cared for by Dr. Carolee.   Plan:  - Continue bowel regimen - Diet as tolerated - Healing and no signs of bleeding - No pain complaints - Recommend follow up with PCP to determine best plan for action for the blood thinner and if he needs to come off coumadin  due to the supra-therapeutic levels and potential need for alternative option, versus if PCP is not comfortable they can refer him to Cardiology    PRN Follow up with me.   All of the above recommendations were discussed with the patient and he has no questions.   [name], MD [facility] [address] [place], [place] [address] [phone] (office)

## 2023-10-10 NOTE — Patient Instructions (Signed)
-   Continue bowel regimen - Diet as tolerated - Healing and no signs of bleeding - No pain complaints - Recommend follow up with PCP to determine best plan for action for the blood thinner and if he needs to come off coumadin  due to the supra-therapeutic levels and potential need for alternative option, versus if PCP is not comfortable they can refer him to Cardiology

## 2023-10-13 ENCOUNTER — Ambulatory Visit: Admitting: Podiatry

## 2023-10-13 ENCOUNTER — Encounter: Payer: Self-pay | Admitting: Podiatry

## 2023-10-13 DIAGNOSIS — M79674 Pain in right toe(s): Secondary | ICD-10-CM

## 2023-10-13 DIAGNOSIS — B351 Tinea unguium: Secondary | ICD-10-CM | POA: Diagnosis not present

## 2023-10-13 DIAGNOSIS — M79675 Pain in left toe(s): Secondary | ICD-10-CM

## 2023-10-21 NOTE — Progress Notes (Signed)
  Subjective:  Patient ID: Nathan Shepherd, male    DOB: 09-24-1955,  MRN: [id number]  68 y.o. male presents to clinic with  painful mycotic toenails of both feet that are difficult to trim. Pain interferes with daily activities and wearing enclosed shoe gear comfortably.  Chief Complaint  Patient presents with   Toe Pain    RFC. Dr. Orlinda Baltimore is PCP per patient report. Denies being diabetic     New problem(s): None   PCP is Wilmon Penton, NP.  Allergies  Allergen Reactions   Penicillins Hives    Tolerated Zosyn     Review of Systems: Negative except as noted in the HPI.   Objective:  Nathan Shepherd is a pleasant 68 y.o. male WD, WN in NAD. AAO x 3.  Vascular Examination: Faintly palpable pedal pulses. Pedal hair absent. CFT immediate b/l. No edema. No pain with calf compression b/l. Skin temperature gradient WNL b/l. Trace edema noted BLE.  Neurological Examination: Sensation grossly intact b/l with 10 gram monofilament. Vibratory sensation intact b/l.   Dermatological Examination: Pedal skin with normal turgor, texture and tone b/l. Toenails 1-5 b/l thick, discolored, elongated with subungual debris and pain on dorsal palpation.  No corns, calluses nor porokeratotic lesions noted.  Musculoskeletal Examination: Muscle strength 5/5 to b/l LE. HAV with bunion deformity noted b/l LE.  Radiographs: None  Assessment:   1. Pain due to onychomycosis of toenails of both feet    Plan:  Consent given for treatment. Patient examined. All patient's and/or POA's questions/concerns addressed on today's visit. Toenails 1-5 debrided in length and girth without incident. Continue soft, supportive shoe gear daily. Report any pedal injuries to medical professional. Call office if there are any questions/concerns. -Patient/POA to call should there be question/concern in the interim.  Return in about 3 months (around 01/13/2024).  Nathan Shepherd, DPM      [place]  LOCATION: [address] [place] [address]                   Office [phone]   [facility] LOCATION: [address] [place], [place] [address] Office [phone]

## 2023-12-31 ENCOUNTER — Encounter (HOSPITAL_COMMUNITY): Admission: EM | Disposition: E | Payer: Self-pay | Source: Skilled Nursing Facility | Attending: Pulmonary Disease

## 2023-12-31 ENCOUNTER — Inpatient Hospital Stay: Admit: 2023-12-31

## 2023-12-31 ENCOUNTER — Inpatient Hospital Stay (HOSPITAL_COMMUNITY): Admitting: Anesthesiology

## 2023-12-31 ENCOUNTER — Inpatient Hospital Stay (HOSPITAL_COMMUNITY)
Admission: EM | Admit: 2023-12-31 | Discharge: 2024-02-04 | Disposition: E | Source: Skilled Nursing Facility | Attending: Pulmonary Disease | Admitting: Pulmonary Disease

## 2023-12-31 ENCOUNTER — Inpatient Hospital Stay (HOSPITAL_COMMUNITY)

## 2023-12-31 ENCOUNTER — Emergency Department (HOSPITAL_COMMUNITY)

## 2023-12-31 ENCOUNTER — Encounter (HOSPITAL_COMMUNITY): Payer: Self-pay | Admitting: Pulmonary Disease

## 2023-12-31 DIAGNOSIS — I6502 Occlusion and stenosis of left vertebral artery: Secondary | ICD-10-CM | POA: Diagnosis present

## 2023-12-31 DIAGNOSIS — Z9911 Dependence on respirator [ventilator] status: Secondary | ICD-10-CM | POA: Diagnosis not present

## 2023-12-31 DIAGNOSIS — G9341 Metabolic encephalopathy: Secondary | ICD-10-CM | POA: Diagnosis present

## 2023-12-31 DIAGNOSIS — T508X5A Adverse effect of diagnostic agents, initial encounter: Secondary | ICD-10-CM | POA: Diagnosis present

## 2023-12-31 DIAGNOSIS — J15212 Pneumonia due to Methicillin resistant Staphylococcus aureus: Secondary | ICD-10-CM | POA: Diagnosis present

## 2023-12-31 DIAGNOSIS — E785 Hyperlipidemia, unspecified: Secondary | ICD-10-CM | POA: Diagnosis not present

## 2023-12-31 DIAGNOSIS — A419 Sepsis, unspecified organism: Secondary | ICD-10-CM | POA: Diagnosis present

## 2023-12-31 DIAGNOSIS — I4891 Unspecified atrial fibrillation: Secondary | ICD-10-CM | POA: Diagnosis present

## 2023-12-31 DIAGNOSIS — N178 Other acute kidney failure: Secondary | ICD-10-CM | POA: Diagnosis present

## 2023-12-31 DIAGNOSIS — R509 Fever, unspecified: Secondary | ICD-10-CM

## 2023-12-31 DIAGNOSIS — Z7901 Long term (current) use of anticoagulants: Secondary | ICD-10-CM

## 2023-12-31 DIAGNOSIS — Z515 Encounter for palliative care: Secondary | ICD-10-CM

## 2023-12-31 DIAGNOSIS — Z7951 Long term (current) use of inhaled steroids: Secondary | ICD-10-CM

## 2023-12-31 DIAGNOSIS — I739 Peripheral vascular disease, unspecified: Secondary | ICD-10-CM | POA: Diagnosis not present

## 2023-12-31 DIAGNOSIS — J40 Bronchitis, not specified as acute or chronic: Secondary | ICD-10-CM | POA: Diagnosis present

## 2023-12-31 DIAGNOSIS — I69398 Other sequelae of cerebral infarction: Secondary | ICD-10-CM

## 2023-12-31 DIAGNOSIS — I6602 Occlusion and stenosis of left middle cerebral artery: Secondary | ICD-10-CM

## 2023-12-31 DIAGNOSIS — Z66 Do not resuscitate: Secondary | ICD-10-CM | POA: Diagnosis present

## 2023-12-31 DIAGNOSIS — G9340 Encephalopathy, unspecified: Secondary | ICD-10-CM | POA: Diagnosis not present

## 2023-12-31 DIAGNOSIS — E861 Hypovolemia: Secondary | ICD-10-CM | POA: Diagnosis present

## 2023-12-31 DIAGNOSIS — G40909 Epilepsy, unspecified, not intractable, without status epilepticus: Secondary | ICD-10-CM | POA: Diagnosis not present

## 2023-12-31 DIAGNOSIS — R0603 Acute respiratory distress: Secondary | ICD-10-CM

## 2023-12-31 DIAGNOSIS — R579 Shock, unspecified: Secondary | ICD-10-CM | POA: Diagnosis not present

## 2023-12-31 DIAGNOSIS — I69391 Dysphagia following cerebral infarction: Secondary | ICD-10-CM | POA: Diagnosis not present

## 2023-12-31 DIAGNOSIS — J189 Pneumonia, unspecified organism: Secondary | ICD-10-CM | POA: Diagnosis present

## 2023-12-31 DIAGNOSIS — I10 Essential (primary) hypertension: Secondary | ICD-10-CM | POA: Diagnosis present

## 2023-12-31 DIAGNOSIS — Z79899 Other long term (current) drug therapy: Secondary | ICD-10-CM

## 2023-12-31 DIAGNOSIS — R29729 NIHSS score 29: Secondary | ICD-10-CM | POA: Diagnosis not present

## 2023-12-31 DIAGNOSIS — G259 Extrapyramidal and movement disorder, unspecified: Secondary | ICD-10-CM | POA: Diagnosis present

## 2023-12-31 DIAGNOSIS — R578 Other shock: Secondary | ICD-10-CM | POA: Diagnosis present

## 2023-12-31 DIAGNOSIS — Z1152 Encounter for screening for COVID-19: Secondary | ICD-10-CM | POA: Diagnosis not present

## 2023-12-31 DIAGNOSIS — K922 Gastrointestinal hemorrhage, unspecified: Secondary | ICD-10-CM | POA: Diagnosis present

## 2023-12-31 DIAGNOSIS — I63412 Cerebral infarction due to embolism of left middle cerebral artery: Secondary | ICD-10-CM | POA: Diagnosis not present

## 2023-12-31 DIAGNOSIS — R6521 Severe sepsis with septic shock: Secondary | ICD-10-CM | POA: Diagnosis present

## 2023-12-31 DIAGNOSIS — K567 Ileus, unspecified: Secondary | ICD-10-CM | POA: Diagnosis present

## 2023-12-31 DIAGNOSIS — G9389 Other specified disorders of brain: Secondary | ICD-10-CM | POA: Diagnosis present

## 2023-12-31 DIAGNOSIS — J9601 Acute respiratory failure with hypoxia: Secondary | ICD-10-CM | POA: Diagnosis present

## 2023-12-31 DIAGNOSIS — N183 Chronic kidney disease, stage 3 unspecified: Secondary | ICD-10-CM

## 2023-12-31 DIAGNOSIS — I7090 Unspecified atherosclerosis: Secondary | ICD-10-CM | POA: Diagnosis not present

## 2023-12-31 DIAGNOSIS — I509 Heart failure, unspecified: Secondary | ICD-10-CM | POA: Diagnosis not present

## 2023-12-31 DIAGNOSIS — D72829 Elevated white blood cell count, unspecified: Secondary | ICD-10-CM | POA: Diagnosis not present

## 2023-12-31 DIAGNOSIS — F0280 Dementia in other diseases classified elsewhere without behavioral disturbance: Secondary | ICD-10-CM | POA: Diagnosis present

## 2023-12-31 DIAGNOSIS — R0902 Hypoxemia: Secondary | ICD-10-CM

## 2023-12-31 DIAGNOSIS — I110 Hypertensive heart disease with heart failure: Secondary | ICD-10-CM | POA: Diagnosis not present

## 2023-12-31 DIAGNOSIS — I63512 Cerebral infarction due to unspecified occlusion or stenosis of left middle cerebral artery: Secondary | ICD-10-CM | POA: Diagnosis present

## 2023-12-31 DIAGNOSIS — I130 Hypertensive heart and chronic kidney disease with heart failure and stage 1 through stage 4 chronic kidney disease, or unspecified chronic kidney disease: Secondary | ICD-10-CM | POA: Diagnosis not present

## 2023-12-31 DIAGNOSIS — J690 Pneumonitis due to inhalation of food and vomit: Secondary | ICD-10-CM | POA: Diagnosis present

## 2023-12-31 DIAGNOSIS — F0390 Unspecified dementia without behavioral disturbance: Secondary | ICD-10-CM | POA: Diagnosis not present

## 2023-12-31 DIAGNOSIS — Z87448 Personal history of other diseases of urinary system: Secondary | ICD-10-CM | POA: Diagnosis not present

## 2023-12-31 DIAGNOSIS — R1310 Dysphagia, unspecified: Secondary | ICD-10-CM | POA: Diagnosis present

## 2023-12-31 DIAGNOSIS — R569 Unspecified convulsions: Secondary | ICD-10-CM | POA: Diagnosis not present

## 2023-12-31 DIAGNOSIS — R4189 Other symptoms and signs involving cognitive functions and awareness: Secondary | ICD-10-CM | POA: Diagnosis present

## 2023-12-31 DIAGNOSIS — E874 Mixed disorder of acid-base balance: Secondary | ICD-10-CM | POA: Diagnosis present

## 2023-12-31 DIAGNOSIS — N179 Acute kidney failure, unspecified: Secondary | ICD-10-CM | POA: Diagnosis not present

## 2023-12-31 DIAGNOSIS — Z9079 Acquired absence of other genital organ(s): Secondary | ICD-10-CM

## 2023-12-31 DIAGNOSIS — J9600 Acute respiratory failure, unspecified whether with hypoxia or hypercapnia: Secondary | ICD-10-CM | POA: Diagnosis not present

## 2023-12-31 DIAGNOSIS — G40901 Epilepsy, unspecified, not intractable, with status epilepticus: Secondary | ICD-10-CM | POA: Diagnosis present

## 2023-12-31 DIAGNOSIS — I959 Hypotension, unspecified: Secondary | ICD-10-CM | POA: Diagnosis not present

## 2023-12-31 DIAGNOSIS — I6389 Other cerebral infarction: Secondary | ICD-10-CM | POA: Diagnosis not present

## 2023-12-31 DIAGNOSIS — N1411 Contrast-induced nephropathy: Secondary | ICD-10-CM | POA: Diagnosis present

## 2023-12-31 HISTORY — PX: IR PERCUTANEOUS ART THROMBECTOMY/INFUSION INTRACRANIAL INC DIAG ANGIO: IMG6087

## 2023-12-31 HISTORY — PX: RADIOLOGY WITH ANESTHESIA: SHX6223

## 2023-12-31 HISTORY — PX: IR US GUIDE VASC ACCESS RIGHT: IMG2390

## 2023-12-31 LAB — I-STAT CHEM 8, ED
BUN: 31 mg/dL — ABNORMAL HIGH (ref 8–23)
Calcium, Ion: 1.28 mmol/L (ref 1.15–1.40)
Chloride: 100 mmol/L (ref 98–111)
Creatinine, Ser: 1.4 mg/dL — ABNORMAL HIGH (ref 0.61–1.24)
Glucose, Bld: 117 mg/dL — ABNORMAL HIGH (ref 70–99)
HCT: 49 % (ref 39.0–52.0)
Hemoglobin: 16.7 g/dL (ref 13.0–17.0)
Potassium: 3.3 mmol/L — ABNORMAL LOW (ref 3.5–5.1)
Sodium: 144 mmol/L (ref 135–145)
TCO2: 30 mmol/L (ref 22–32)

## 2023-12-31 LAB — RESP PANEL BY RT-PCR (RSV, FLU A&B, COVID)  RVPGX2
Influenza A by PCR: NEGATIVE
Influenza B by PCR: NEGATIVE
Resp Syncytial Virus by PCR: NEGATIVE
SARS Coronavirus 2 by RT PCR: NEGATIVE

## 2023-12-31 LAB — URINALYSIS, ROUTINE W REFLEX MICROSCOPIC
Bacteria, UA: NONE SEEN
Bilirubin Urine: NEGATIVE
Glucose, UA: NEGATIVE mg/dL
Ketones, ur: NEGATIVE mg/dL
Nitrite: NEGATIVE
Protein, ur: 100 mg/dL — AB
Specific Gravity, Urine: >1.046 — ABNORMAL HIGH (ref 1.005–1.030)
pH: 5 (ref 5.0–8.0)

## 2023-12-31 LAB — DIFFERENTIAL
Abs Immature Granulocytes: 0.14 K/uL — ABNORMAL HIGH (ref 0.00–0.07)
Basophils Absolute: 0 K/uL (ref 0.0–0.1)
Basophils Relative: 0 %
Eosinophils Absolute: 0 K/uL (ref 0.0–0.5)
Eosinophils Relative: 0 %
Immature Granulocytes: 1 %
Lymphocytes Relative: 2 %
Lymphs Abs: 0.3 K/uL — ABNORMAL LOW (ref 0.7–4.0)
Monocytes Absolute: 2.6 K/uL — ABNORMAL HIGH (ref 0.1–1.0)
Monocytes Relative: 17 %
Neutro Abs: 12.5 K/uL — ABNORMAL HIGH (ref 1.7–7.7)
Neutrophils Relative %: 80 %

## 2023-12-31 LAB — POCT I-STAT 7, (LYTES, BLD GAS, ICA,H+H)
Acid-Base Excess: 3 mmol/L — ABNORMAL HIGH (ref 0.0–2.0)
Bicarbonate: 23 mmol/L (ref 20.0–28.0)
Calcium, Ion: 1.2 mmol/L (ref 1.15–1.40)
HCT: 36 % — ABNORMAL LOW (ref 39.0–52.0)
Hemoglobin: 12.2 g/dL — ABNORMAL LOW (ref 13.0–17.0)
O2 Saturation: 100 %
Patient temperature: 36.6
Potassium: 3.6 mmol/L (ref 3.5–5.1)
Sodium: 142 mmol/L (ref 135–145)
TCO2: 24 mmol/L (ref 22–32)
pCO2 arterial: 23.1 mmHg — ABNORMAL LOW (ref 32–48)
pH, Arterial: 7.604 (ref 7.35–7.45)
pO2, Arterial: 191 mmHg — ABNORMAL HIGH (ref 83–108)

## 2023-12-31 LAB — URINE DRUG SCREEN
Amphetamines: NEGATIVE
Barbiturates: NEGATIVE
Benzodiazepines: NEGATIVE
Cocaine: NEGATIVE
Fentanyl: NEGATIVE
Methadone Scn, Ur: NEGATIVE
Opiates: NEGATIVE
Tetrahydrocannabinol: NEGATIVE

## 2023-12-31 LAB — AMMONIA: Ammonia: 27 umol/L (ref 9–35)

## 2023-12-31 LAB — CBC
HCT: 42.6 % (ref 39.0–52.0)
Hemoglobin: 13.5 g/dL (ref 13.0–17.0)
MCH: 26.6 pg (ref 26.0–34.0)
MCHC: 31.7 g/dL (ref 30.0–36.0)
MCV: 84 fL (ref 80.0–100.0)
Platelets: 189 K/uL (ref 150–400)
RBC: 5.07 MIL/uL (ref 4.22–5.81)
RDW: 13.8 % (ref 11.5–15.5)
WBC: 15.5 K/uL — ABNORMAL HIGH (ref 4.0–10.5)
nRBC: 0 % (ref 0.0–0.2)

## 2023-12-31 LAB — HEMOGLOBIN A1C
Hgb A1c MFr Bld: 5.6 % (ref 4.8–5.6)
Mean Plasma Glucose: 114.02 mg/dL

## 2023-12-31 LAB — APTT: aPTT: 27 s (ref 24–36)

## 2023-12-31 LAB — COMPREHENSIVE METABOLIC PANEL WITH GFR
ALT: 6 U/L (ref 0–44)
AST: 24 U/L (ref 15–41)
Albumin: 3.5 g/dL (ref 3.5–5.0)
Alkaline Phosphatase: 71 U/L (ref 38–126)
Anion gap: 15 (ref 5–15)
BUN: 28 mg/dL — ABNORMAL HIGH (ref 8–23)
CO2: 26 mmol/L (ref 22–32)
Calcium: 9.7 mg/dL (ref 8.9–10.3)
Chloride: 101 mmol/L (ref 98–111)
Creatinine, Ser: 1.11 mg/dL (ref 0.61–1.24)
GFR, Estimated: >60 mL/min
Glucose, Bld: 115 mg/dL — ABNORMAL HIGH (ref 70–99)
Potassium: 3.2 mmol/L — ABNORMAL LOW (ref 3.5–5.1)
Sodium: 143 mmol/L (ref 135–145)
Total Bilirubin: 0.7 mg/dL (ref 0.0–1.2)
Total Protein: 7.5 g/dL (ref 6.5–8.1)

## 2023-12-31 LAB — PROTIME-INR
INR: 1.2 (ref 0.8–1.2)
Prothrombin Time: 15.6 s — ABNORMAL HIGH (ref 11.4–15.2)

## 2023-12-31 LAB — CK: Total CK: 43 U/L — ABNORMAL LOW (ref 49–397)

## 2023-12-31 LAB — GLUCOSE, CAPILLARY: Glucose-Capillary: 105 mg/dL — ABNORMAL HIGH (ref 70–99)

## 2023-12-31 LAB — LACTIC ACID, PLASMA
Lactic Acid, Venous: 3.5 mmol/L (ref 0.5–1.9)
Lactic Acid, Venous: 2.3 mmol/L (ref 0.5–1.9)

## 2023-12-31 LAB — ETHANOL: Alcohol, Ethyl (B): <15 mg/dL

## 2023-12-31 LAB — VALPROIC ACID LEVEL: Valproic Acid Lvl: 50 ug/mL (ref 50–100)

## 2023-12-31 LAB — CBG MONITORING, ED: Glucose-Capillary: 126 mg/dL — ABNORMAL HIGH (ref 70–99)

## 2023-12-31 LAB — MRSA NEXT GEN BY PCR, NASAL: MRSA by PCR Next Gen: DETECTED — AB

## 2023-12-31 MED ORDER — LORAZEPAM 2 MG/ML IJ SOLN
INTRAMUSCULAR | Status: AC
  Administered 2023-12-31: 4 mg via INTRAVENOUS
  Filled 2023-12-31: qty 2

## 2023-12-31 MED ORDER — IOHEXOL 300 MG/ML  SOLN
150.0000 mL | Freq: Once | INTRAMUSCULAR | Status: AC | PRN
  Administered 2023-12-31: 42 mL via INTRA_ARTERIAL

## 2023-12-31 MED ORDER — FENTANYL 2500MCG IN NS 250ML (10MCG/ML) PREMIX INFUSION
0.0000 ug/h | INTRAVENOUS | Status: DC
  Administered 2023-12-31: 25 ug/h via INTRAVENOUS
  Filled 2023-12-31: qty 250

## 2023-12-31 MED ORDER — SODIUM CHLORIDE 0.9 % IV BOLUS
1000.0000 mL | Freq: Once | INTRAVENOUS | Status: AC
  Administered 2023-12-31: 1000 mL via INTRAVENOUS

## 2023-12-31 MED ORDER — PROPOFOL 500 MG/50ML IV EMUL
INTRAVENOUS | Status: DC | PRN
  Administered 2023-12-31: 50 ug/kg/min via INTRAVENOUS

## 2023-12-31 MED ORDER — SODIUM CHLORIDE 0.9 % IV SOLN: INTRAVENOUS | Status: DC | PRN

## 2023-12-31 MED ORDER — PROPOFOL 1000 MG/100ML IV EMUL
INTRAVENOUS | Status: AC
  Filled 2023-12-31: qty 100

## 2023-12-31 MED ORDER — INSULIN ASPART 100 UNIT/ML IJ SOLN
0.0000 [IU] | INTRAMUSCULAR | Status: DC
  Administered 2024-01-01 – 2024-01-06 (×3): 1 [IU] via SUBCUTANEOUS

## 2023-12-31 MED ORDER — SODIUM CHLORIDE 0.9 % IV SOLN
200.0000 mg | Freq: Once | INTRAVENOUS | Status: AC
  Administered 2023-12-31: 200 mg via INTRAVENOUS
  Filled 2023-12-31: qty 20

## 2023-12-31 MED ORDER — CHLORHEXIDINE GLUCONATE CLOTH 2 % EX PADS
6.0000 | MEDICATED_PAD | Freq: Every day | CUTANEOUS | Status: AC
  Administered 2024-01-01 – 2024-01-06 (×5): 6 via TOPICAL

## 2023-12-31 MED ORDER — DEXAMETHASONE SOD PHOSPHATE PF 10 MG/ML IJ SOLN: 10.0000 mg | Freq: Once | INTRAMUSCULAR | Status: DC

## 2023-12-31 MED ORDER — KETAMINE HCL 10 MG/ML IJ SOLN
INTRAMUSCULAR | Status: AC
  Filled 2023-12-31: qty 1

## 2023-12-31 MED ORDER — SODIUM CHLORIDE 0.9 % IV SOLN
2.0000 g | Freq: Once | INTRAVENOUS | Status: AC
  Administered 2023-12-31: 2 g via INTRAVENOUS
  Filled 2023-12-31: qty 20

## 2023-12-31 MED ORDER — VANCOMYCIN HCL IN DEXTROSE 1-5 GM/200ML-% IV SOLN
1000.0000 mg | Freq: Once | INTRAVENOUS | Status: DC
  Filled 2023-12-31: qty 200

## 2023-12-31 MED ORDER — LORAZEPAM 2 MG/ML IJ SOLN
INTRAMUSCULAR | Status: AC
  Administered 2023-12-31: 4 mg via INTRAVENOUS
  Filled 2023-12-31: qty 1

## 2023-12-31 MED ORDER — KETAMINE HCL 50 MG/5ML IJ SOSY
PREFILLED_SYRINGE | INTRAMUSCULAR | Status: AC
  Filled 2023-12-31: qty 5

## 2023-12-31 MED ORDER — LORAZEPAM 2 MG/ML IJ SOLN
INTRAMUSCULAR | Status: AC
  Filled 2023-12-31: qty 1

## 2023-12-31 MED ORDER — NOREPINEPHRINE 4 MG/250ML-% IV SOLN
0.0000 ug/min | INTRAVENOUS | Status: DC
  Administered 2023-12-31: 2 ug/min via INTRAVENOUS
  Administered 2023-12-31: 5 ug/min via INTRAVENOUS

## 2023-12-31 MED ORDER — ROCURONIUM BROMIDE 10 MG/ML (PF) SYRINGE
PREFILLED_SYRINGE | INTRAVENOUS | Status: AC
  Administered 2023-12-31: 100 mg via INTRAVENOUS
  Filled 2023-12-31: qty 10

## 2023-12-31 MED ORDER — LORAZEPAM 2 MG/ML IJ SOLN: 4.0000 mg | Freq: Once | INTRAMUSCULAR | Status: AC

## 2023-12-31 MED ORDER — ROCURONIUM BROMIDE 10 MG/ML (PF) SYRINGE: 100.0000 mg | PREFILLED_SYRINGE | INTRAVENOUS | Status: AC

## 2023-12-31 MED ORDER — DEXAMETHASONE SODIUM PHOSPHATE 4 MG/ML IJ SOLN
INTRAMUSCULAR | Status: DC | PRN
  Administered 2023-12-31: 10 mg via INTRAVENOUS

## 2023-12-31 MED ORDER — IOHEXOL 350 MG/ML SOLN
75.0000 mL | Freq: Once | INTRAVENOUS | Status: AC | PRN
  Administered 2023-12-31: 75 mL via INTRAVENOUS

## 2023-12-31 MED ORDER — ORAL CARE MOUTH RINSE
15.0000 mL | OROMUCOSAL | Status: AC
  Administered 2023-12-31 – 2024-01-07 (×81): 15 mL via OROMUCOSAL

## 2023-12-31 MED ORDER — POLYETHYLENE GLYCOL 3350 17 G PO PACK
17.0000 g | PACK | Freq: Every day | ORAL | Status: DC
  Administered 2023-12-31 – 2024-01-01 (×2): 17 g
  Filled 2023-12-31: qty 1

## 2023-12-31 MED ORDER — SODIUM CHLORIDE 0.9 % IV SOLN: 250.0000 mL | INTRAVENOUS | Status: AC

## 2023-12-31 MED ORDER — DOCUSATE SODIUM 50 MG/5ML PO LIQD
100.0000 mg | Freq: Two times a day (BID) | ORAL | Status: DC
  Administered 2023-12-31 – 2024-01-02 (×5): 100 mg
  Filled 2023-12-31: qty 10

## 2023-12-31 MED ORDER — FENTANYL CITRATE (PF) 100 MCG/2ML IJ SOLN
INTRAMUSCULAR | Status: AC
  Administered 2023-12-31: 50 ug
  Filled 2023-12-31: qty 2

## 2023-12-31 MED ORDER — LACTATED RINGERS IV BOLUS
1000.0000 mL | Freq: Once | INTRAVENOUS | Status: AC
  Administered 2023-12-31: 1000 mL via INTRAVENOUS

## 2023-12-31 MED ORDER — MUPIROCIN 2 % EX OINT
1.0000 | TOPICAL_OINTMENT | Freq: Two times a day (BID) | CUTANEOUS | Status: AC
  Administered 2024-01-01 – 2024-01-05 (×10): 1 via NASAL

## 2023-12-31 MED ORDER — ORAL CARE MOUTH RINSE: 15.0000 mL | OROMUCOSAL | Status: AC | PRN

## 2023-12-31 MED ORDER — KETAMINE HCL 50 MG/5ML IJ SOSY
PREFILLED_SYRINGE | INTRAMUSCULAR | Status: AC
  Administered 2023-12-31: 100 mg via INTRAVENOUS
  Filled 2023-12-31: qty 5

## 2023-12-31 MED ORDER — LORAZEPAM 2 MG/ML IJ SOLN
2.0000 mg | INTRAMUSCULAR | Status: DC | PRN
  Administered 2024-01-07: 2 mg via INTRAVENOUS

## 2023-12-31 MED ORDER — CLEVIDIPINE BUTYRATE 0.5 MG/ML IV EMUL
0.0000 mg/h | INTRAVENOUS | Status: DC
  Administered 2023-12-31: 2 mg/h via INTRAVENOUS
  Administered 2024-01-01: 4 mg/h via INTRAVENOUS
  Filled 2023-12-31: qty 50

## 2023-12-31 MED ORDER — FAMOTIDINE 20 MG PO TABS
20.0000 mg | ORAL_TABLET | Freq: Two times a day (BID) | ORAL | Status: DC
  Administered 2023-12-31 – 2024-01-01 (×2): 20 mg
  Filled 2023-12-31: qty 1

## 2023-12-31 MED ORDER — KETAMINE HCL 50 MG/5ML IJ SOSY: 100.0000 mg | PREFILLED_SYRINGE | INTRAMUSCULAR | Status: AC

## 2023-12-31 MED ORDER — NOREPINEPHRINE 4 MG/250ML-% IV SOLN
INTRAVENOUS | Status: AC
  Filled 2023-12-31: qty 250

## 2023-12-31 MED ORDER — PROPOFOL 1000 MG/100ML IV EMUL: 0.0000 ug/kg/min | INTRAVENOUS | Status: DC

## 2023-12-31 NOTE — Sepsis Progress Note (Addendum)
 Elink following sepsis No BC or lactic acid ordered, message sent to MD asking for them to be ordered,  message also sent to bedside RN to make aware of MD ordering labs.Bedside RN messaged back that [facility] has already left heading to [facility] and antibiotics have not been started. Once at [facility] pt was taken to IR. Bedside Rn that will resume care in ICU has been notified of sepsis orders not completed yet.

## 2023-12-31 NOTE — Consult Note (Addendum)
 [facility] Telemedicine Consult   Requesting Provider: Lamar Gander Consult Participants: Myself, patient, bedside nurse, EMS, Dr. Gander Location of the provider: [facility] hospital  Location of the patient: [facility]   This consult was provided via telemedicine with 2-way video and audio communication. Emergency consent applied given patient's mental status   Chief Complaint:  Acutely non-verbal and not interactive  HPI: This is a 68 y.o. 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  He was last seen at his baseline at 10:30 AM by group home staff.  At 1:08 PM EMS was called for the patient being acutely altered.  They noted that he had right gaze deviation and route, brief left gaze deviation while entering the hospital, then gaze was midline.  No speech and not following commands.  Not moving any of his extremities to stimuli.  Not tracking or interacting, but eyes open and blinking to eyelash brush.  Over the course of my evaluation he did have some further episodes of slight head tremoring and then left gaze deviation which was sustained and which could not be overcome with VOR performed by Dr. Gander.  Ativan  4 mg was administered without change in gaze deviation or other appreciable effect He also was given 200 mg Vimpat   Intubated for airway protection  BP did drop after intubation; he responded to IVF (2 liters ordered)  Difficult to fully determine his baseline but he does reside in a group home.  Is typically verbal and ambulatory per report from EMS but likely has some cognitive impairment at baseline (Dementia on Aricept  listed on last discharge in September).  Per physical therapy cognition comments close to time  of last discharge is that he was oriented x 1 requiring max encouragement and repeated verbal cues to stay on task with short shuffled steps and poor posture, max assist with a walker.  Unclear at this time how much he improved at his facility.  We are unable to reach his facility or legal guardian for additional collateral or to obtain consent  On chart review he has had multiple similar presentations with near obtundation in the setting of breakthrough seizures.  Semiology is typically described as a right gaze deviation with right sided facial twitching and right arm twitching.  He typically recovers gradually over several days   LKW: 10:30 AM Thrombolytic given?: No, on initial arrival primary concern was for seizure. Additionally there was no way to personally confirm LKW was accurately reported to EMS and there was no family to provide consent -- with lack of initial diagnostic certainly, and complexity of decision making here risks not felt to outweigh benefits especially given patient presented near the end of the time window IR Thrombectomy? Yes, 2 physician agreement given unable to obtain consent or reach legal guardian MRS: 3-4 (limited collateral available regarding current functional status)  Time of teleneurologist evaluation: 13:59  Exam: Vitals:   12/31/23 1439 12/31/23 1445  BP:  (!) 184/131  Pulse:  (!) 102  Resp:  10  Temp: (!) 102.8 F (39.3 C)   SpO2:  96%    General: ill-appearing, thin  Pulmonary: breathing is rapid and shallow Cardiac: tachycardic on monitor  NIH Stroke scale 1A: Level of Consciousness - 1 1B: Ask Month and Age - 2 1C: 'Blink Eyes' & '  Squeeze Hands' - 2 (does blink to eyelash brush bilaterally) 2: Test Horizontal Extraocular Movements - 2 -- intermittently midline but then sustained left gaze not crossing midline on VOR 3: Test Visual Fields - 0 4: Test Facial Palsy - 0 5A: Test Left Arm Motor Drift - 4 5B: Test Right Arm Motor Drift -  4 6A: Test Left Leg Motor Drift - 4 6B: Test Right Leg Motor Drift - 4 7: Test Limb Ataxia - 0 8: Test Sensation - 2 9: Test Language/Aphasia- 3 10: Test Dysarthria - 2 11: Test Extinction/Inattention - 0  NIHSS score: 30   Imaging Reviewed:  Head CT no acute intracranial process CTA L M2 occlusion which appears acute, left vert which appears chronic, open basilar   Labs reviewed in epic and pertinent values follow: WBC 16.6, platelets 189 INR 1.2 PT 16.8 (mildly elevated)   Assessment: This is a complex medical situation and with likely multiple coexisting pathologies.  Most emergently, patient does appear to have an acute left M2 occlusion which would explain his gaze deviation and being opposite of what is typically seen for his seizures (left gaze deviation instead of right gaze deviation).  Given his overall very poor exam, I suspect he may also have had breakthrough seizures in the setting of febrile illness, and he is also being treated for concern for status epilepticus.  Given the complexity of decision making and initial concern for seizure, he was not within TNK treatment window by the time all the information was processed -- he came in very close to the end of the treatment window. 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  I was unable to reach his legal guardian and therefore discussed the case with Dr. Lester (neuro IR) and decision was made to proceed with thrombectomy  Recommendations:   # Acute M2 occlusion  - Out of TNK window - IR thrombectomy after discussion with Dr. Lester - Full stroke workup at [facility] but etiology most likely cardioembolic from known afib not on anticogulation - Strictly avoid hypotension en route   # History of seizures; likely breakthrough seizure in the setting of fever - s/p Ativan  4 mg  x 1 dose  - s/p Vimpat  loading dose of 200 mg  - Keppra  and Depakote  levels pending to confirm appropriate administration at facility - Home dose Keppra  1000 mg BID - Home dose Depakote  750 mg BID (consider alternative due to interactions with anticoagulation)  # Afib - Hold anticoagulation pending post-procedure MRI  # Fever - Flu panel sent at [facility] - Appreciate further workup by CCM / ED    This patient is receiving care for possible acute neurological changes. There was 105 minutes of care by this provider at the time of service, including time for direct evaluation via telemedicine, review of medical records, imaging studies and discussion of findings with providers, the patient and/or family.   Lola Jernigan MD-PhD [facility] [phone]   If 8pm-8am, please page neurology on call as listed in AMION.  CRITICAL CARE Performed by: Lola LITTIE Jernigan   Total critical care time: 105 minutes  Critical care time was exclusive of separately billable procedures and treating other patients.  Critical care was necessary to treat or prevent imminent or life-threatening deterioration.  Critical care was time spent personally by me on the following activities: development of treatment  plan with patient and/or surrogate as well as nursing, discussions with consultants, evaluation of patient's response to treatment, examination of patient, obtaining history from patient or surrogate, ordering and performing treatments and interventions, ordering and review of laboratory studies, ordering and review of radiographic studies, pulse oximetry and re-evaluation of patient's condition.

## 2023-12-31 NOTE — Code Documentation (Addendum)
 Stroke Response Nurse Documentation Code Documentation  Nathan Shepherd is a 68 y.o. 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 LKW 1030 today. EMS was called at 1308 for altered mental status.   Patient was intubated at [facility] ED and stroke work up was initiated. Patient transferred via [facility] to [facility] for IR intervention. Patient arrived to IR suite at 1644. NIH 31 on arrival to [facility], see flowsheet.   Hand-off given to [employer].   [name]  Stroke Response RN

## 2023-12-31 NOTE — ED Notes (Signed)
Report given to [facility]

## 2023-12-31 NOTE — ED Provider Notes (Signed)
 " [place] EMERGENCY DEPARTMENT AT [facility] Provider Note   CSN: [id number] Arrival date & time: 12/31/23  1359     Patient presents with: No chief complaint on file.   Nathan Shepherd is a 68 y.o. male.   68 year old male history of stroke, seizures on valproic  acid and Keppra , atrial fibrillation on Coumadin  who presents to the emergency department for altered mental status.  Patient was last seen well at 10:30 AM at his facility [facility] and ENT eval).  Was found again by staff at approximately 1 PM and was altered.  911 was called.  At that point in time he had a right gaze preference.  Was nonverbal.  Did withdraw his right upper extremity to pain for them.  Is now looking midline.  Noted to be markedly hypertensive with a blood pressure in the 220s systolic       Prior to Admission medications  Medication Sig Start Date End Date Taking? Authorizing Provider  acetaminophen  (TYLENOL ) 325 MG tablet Take 2 tablets (650 mg total) by mouth every 6 (six) hours as needed for mild pain (pain score 1-3), moderate pain (pain score 4-6) or fever. 09/24/23  Yes Hongalgi, Anand D, MD  aluminum-magnesium  hydroxide-simethicone (MAALOX) 200-200-20 MG/5ML SUSP Take 30 mLs by mouth every 6 (six) hours as needed (indigestion, heartburn).   Yes [provider]  carvedilol  (COREG ) 3.125 MG tablet Take 1 tablet (3.125 mg total) by mouth 2 (two) times daily. 09/07/23 09/06/24 Yes Emokpae, Courage, MD  Cholecalciferol (VITAMIN D3) 1.25 MG (50000 UT) CAPS Take 50,000 Units by mouth every Monday.   Yes [provider]  diphenhydrAMINE  (BENADRYL ) 25 MG tablet Take 25 mg by mouth every 8 (eight) hours as needed for allergies or itching.   Yes [provider]  divalproex  (DEPAKOTE ) 250 MG DR tablet Take 3 tablets (750 mg total) by mouth every 12 (twelve) hours. 10/24/22 12/31/23 Yes Sreenath, Sudheer B, MD  donepezil  (ARICEPT ) 5 MG tablet Take 5 mg by mouth at  bedtime.   Yes [provider]  fluticasone -salmeterol (ADVAIR) 500-50 MCG/ACT AEPB Inhale 1 puff into the lungs in the morning and at bedtime.   Yes [provider]  levETIRAcetam  (KEPPRA ) 1000 MG tablet Take 1,000 mg by mouth 2 (two) times daily.   Yes [provider]  Multiple Vitamin (MULTIVITAMIN WITH MINERALS) TABS tablet Take 1 tablet by mouth daily. 09/25/23  Yes Hongalgi, Anand D, MD  Nutritional Supplements (NUTRITIONAL SUPPLEMENT PO) Take 1 Container by mouth 2 (two) times daily. Magic Cup with breakfast and lunch   Yes [provider]  omeprazole  (PRILOSEC) 20 MG capsule Take 1 capsule (20 mg total) by mouth 2 (two) times daily before a meal. Take 30 min before breakfast and 30 min before dinner 09/07/23  Yes Emokpae, Courage, MD  ondansetron  (ZOFRAN ) 4 MG tablet Take 1 tablet (4 mg total) by mouth every 6 (six) hours as needed for nausea. 09/07/23  Yes Pearlean Manus, MD  polyethylene glycol (MIRALAX ) 17 g packet Take 17 g by mouth every Monday, Wednesday, and Friday. 09/08/23  Yes Emokpae, Courage, MD  rosuvastatin  (CRESTOR ) 20 MG tablet Take 20 mg by mouth at bedtime.    Yes [provider]  senna-docusate (SENOKOT-S) 8.6-50 MG tablet Take 2 tablets by mouth at bedtime. 09/07/23 09/06/24 Yes Emokpae, Courage, MD  torsemide  (DEMADEX ) 20 MG tablet Take 20 mg by mouth every morning.   Yes [provider]  SYMBICORT  160-4.5 MCG/ACT inhaler Inhale  2 puffs into the lungs in the morning and at bedtime. Patient not taking: Reported on 12/31/2023 09/07/23   Pearlean Manus, MD    Allergies: Penicillins    Review of Systems  Updated Vital Signs BP (!) 133/105   Pulse 73   Temp (!) 101.7 F (38.7 C)   Resp 20   Ht 6' 4 (1.93 m)   SpO2 100%   BMI 24.10 kg/m   Physical Exam Constitutional:      Comments: Eyes open staring off into space midline.  Is having fine twitching of his head and neck and left upper chest wall.  Eyes:      Comments:  Pupils 5 mm bilaterally.  Cardiovascular:     Rate and Rhythm: Regular rhythm. Tachycardia present.  Pulmonary:     Effort: Pulmonary effort is normal.     Breath sounds: Normal breath sounds.  Neurological:     Comments: Does not withdraw any extremities to pain.  Eyes are midline.  No convulsions noted.     (all labs ordered are listed, but only abnormal results are displayed) Labs Reviewed  PROTIME-INR - Abnormal; Notable for the following components:      Result Value   Prothrombin Time 15.6 (*)    All other components within normal limits  CBC - Abnormal; Notable for the following components:   WBC 15.5 (*)    All other components within normal limits  DIFFERENTIAL - Abnormal; Notable for the following components:   Neutro Abs 12.5 (*)    Lymphs Abs 0.3 (*)    Monocytes Absolute 2.6 (*)    Abs Immature Granulocytes 0.14 (*)    All other components within normal limits  COMPREHENSIVE METABOLIC PANEL WITH GFR - Abnormal; Notable for the following components:   Potassium 3.2 (*)    Glucose, Bld 115 (*)    BUN 28 (*)    All other components within normal limits  URINALYSIS, ROUTINE W REFLEX MICROSCOPIC - Abnormal; Notable for the following components:   Specific Gravity, Urine >1.046 (*)    Hgb urine dipstick SMALL (*)    Protein, ur 100 (*)    Leukocytes,Ua MODERATE (*)    All other components within normal limits  CK - Abnormal; Notable for the following components:   Total CK 43 (*)    All other components within normal limits  CBG MONITORING, ED - Abnormal; Notable for the following components:   Glucose-Capillary 126 (*)    All other components within normal limits  RESP PANEL BY RT-PCR (RSV, FLU A&B, COVID)  RVPGX2  MRSA NEXT GEN BY PCR, NASAL  APTT  ETHANOL  URINE DRUG SCREEN  AMMONIA  VALPROIC  ACID LEVEL  LEVETIRACETAM  LEVEL  LACTIC ACID, PLASMA  BLOOD GAS, ARTERIAL  CBC  BASIC METABOLIC PANEL WITH GFR  MAGNESIUM   PHOSPHORUS  I-STAT CHEM 8,  ED    EKG: None  Radiology: [facility] Chest Port 1 View Result Date: 12/31/2023 CLINICAL DATA:  Endotracheal and OG tube placement. EXAM: PORTABLE CHEST 1 VIEW, ABDOMEN PORTABLE. COMPARISON:  12/23/2023. FINDINGS: Chest: The heart size and mediastinal contours are stable. There is atherosclerotic calcification of the aorta. No consolidation, effusion, or pneumothorax is seen. An endotracheal tube terminates 3.1 cm above the carina. Abdomen: There is a nonobstructive bowel-gas pattern in the upper abdomen. An enteric tube terminates in the stomach and appears appropriate in position. IMPRESSION: 1. No active disease. 2. Support apparatus as described above. Electronically Signed   By: Leita Waddell HERO.D.  On: 12/31/2023 19:33   DG Abd Portable 1V Result Date: 12/31/2023 CLINICAL DATA:  Endotracheal and OG tube placement. EXAM: PORTABLE CHEST 1 VIEW, ABDOMEN PORTABLE. COMPARISON:  12/23/2023. FINDINGS: Chest: The heart size and mediastinal contours are stable. There is atherosclerotic calcification of the aorta. No consolidation, effusion, or pneumothorax is seen. An endotracheal tube terminates 3.1 cm above the carina. Abdomen: There is a nonobstructive bowel-gas pattern in the upper abdomen. An enteric tube terminates in the stomach and appears appropriate in position. IMPRESSION: 1. No active disease. 2. Support apparatus as described above. Electronically Signed   By: Leita Birmingham M.D.   On: 12/31/2023 19:33   DG Chest Portable 1 View Result Date: 12/31/2023 CLINICAL DATA:  Post intubation and OG tube placement. EXAM: PORTABLE CHEST 1 VIEW COMPARISON:  12/31/2023. FINDINGS: Heart is enlarged and the mediastinal contour is within normal limits. There is atherosclerotic calcification of the aorta. The lungs are clear without effusions or infiltrates. An enteric tube terminates in the stomach and appears appropriate in position. The endotracheal tube terminates 5.5 cm above the carina. No acute osseous  abnormality. IMPRESSION: 1. No active disease. 2. Support apparatus as described above. Electronically Signed   By: Leita Birmingham M.D.   On: 12/31/2023 16:39   DG CHEST PORT 1 VIEW Result Date: 12/31/2023 CLINICAL DATA:  Altered mental status, code stroke. EXAM: PORTABLE CHEST 1 VIEW COMPARISON:  09/20/2023. FINDINGS: The heart is enlarged and the mediastinal contour is within normal limits. There is atherosclerotic calcification of the aorta. No consolidation, effusion, or pneumothorax is seen. No acute osseous abnormality. IMPRESSION: No active disease. Electronically Signed   By: Leita Birmingham M.D.   On: 12/31/2023 16:38   CT ANGIO HEAD NECK W WO CM (CODE STROKE) Result Date: 12/31/2023 EXAM: CTA Head and Neck with Intravenous Contrast. CLINICAL HISTORY: Neuro deficit, acute, stroke suspected. TECHNIQUE: Axial CTA images of the head and neck performed with intravenous contrast. MIP reconstructed images were created and reviewed. Axial computed tomography images of the head/brain performed without intravenous contrast. Note: Per PQRS, the description of internal carotid artery percent stenosis, including 0 percent or normal exam, is based on [place] [employer] Symptomatic Carotid Endarterectomy Trial (NASCET) criteria. Dose reduction technique was used including one or more of the following: automated exposure control, adjustment of mA and kV according to patient size, and/or iterative reconstruction. CONTRAST: With and without. 75 mL (iohexol  (OMNIPAQUE ) 350 MG/ML injection 75 mL IOHEXOL  350 MG/ML SOLN). COMPARISON: CT head and CTA head and neck dated 08/17/2016. FINDINGS: SINUSES AND MASTOIDS: Similar appearance of diffuse paranasal sinus mucosal thickening particularly in the frontal sinuses and right maxillary sinus. The mastoid air cells are clear. CTA NECK: COMMON CAROTID ARTERIES: Minimal atherosclerosis at the right carotid bifurcation without hemodynamically significant stenosis. Minimal  atherosclerosis at the left carotid bifurcation without hemodynamically significant stenosis. No dissection or occlusion. INTERNAL CAROTID ARTERIES: There is tortuosity of the proximal and mid right cervical ICA. Mild tortuosity of the proximal left cervical ICA. No stenosis by NASCET criteria. No dissection or occlusion. VERTEBRAL ARTERIES: The right vertebral artery is patent from the origin to the intracranial segment. 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 There are a few areas of atherosclerosis and mild narrowing along the right V2 segment which are slightly increased from prior. The left vertebral artery is occluded at its origin which is new since 2018, although favored this finding  to be chronic. The left vertebral artery remains occluded to the level of the distal V4 segment at which point there is contrast filling of the vessel likely via retrograde flow. Atherosclerosis noted along the left V4 segment which is increased from prior. CTA HEAD: ANTERIOR CEREBRAL ARTERIES: The anterior cerebral arteries are patent bilaterally. No significant stenosis. No occlusion. No aneurysm. MIDDLE CEREBRAL ARTERIES: The right MCA is patent. There is mild narrowing of a few proximal right M2 branches. The left M1 segment is patent. There is occlusion of a proximal M2 branch of the left MCA (series 7, image 116). There is reconstitution of multiple M3 and M4 branches within the posterior aspect of the left MCA territory. No aneurysm. POSTERIOR CEREBRAL ARTERIES: Diminutive appearance of the left PCA without focal occlusion likely related to the prior infarct. The right PCA is patent. No significant stenosis. No occlusion. No aneurysm. BASILAR ARTERY: The basilar artery is patent. No significant stenosis. No occlusion. No aneurysm. OTHER: Mild atherosclerosis of the aortic arch. Atherosclerosis at the left  subclavian artery origin and along the proximal right subclavian artery which are increased from prior without evidence of significant stenosis. SOFT TISSUES: No acute finding. No masses or lymphadenopathy. BONES: Partial fusion of the C5 and C6 vertebral bodies. No acute osseous abnormality. LUNGS: There are a few bilateral upper lobe pulmonary nodules measuring up to 3 mm. VASCULATURE: There is atherosclerosis of the bilateral carotid siphons. Mild stenosis of the left cavernous ICA. IMPRESSION: 1. Occlusion of a proximal M2 branch of the left MCA. Reconstitution of multiple M3 and M4 branches in the posterior left MCA territory, likely via collaterals. 2. 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 3. 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 4. Additional mild atherosclerosis as above. 5. Bilateral upper lobe pulmonary nodules measuring up to 3 mm, with optional non-contrast chest CT at 12 months if the patient is high risk, per Fleischner Society Guidelines. 6. Impression #2 and #3 discussed with Dr. [name] at 2:33 PM on 12/31/23 and impression #1 discussed at 2:46 PM on 12/31/23. Electronically signed by: Donnice Mania MD 12/31/2023 02:52 PM EST RP Workstation: [id number]   CT HEAD CODE STROKE WO CONTRAST (LKW 0-4.5h, LVO 0-24h) Result Date: 12/31/2023 EXAM: CT HEAD WITHOUT 12/31/2023 02:13:57 PM TECHNIQUE: CT of the head was performed without the administration of intravenous contrast. Automated exposure control, iterative reconstruction, and/or weight based adjustment of the mA/kV was utilized to reduce the radiation dose to as low as reasonably achievable. COMPARISON: 02/22/2023 CLINICAL HISTORY: Neuro deficit, acute, stroke suspected FINDINGS: BRAIN AND VENTRICLES: No acute intracranial hemorrhage. No mass effect or midline shift. No extra-axial fluid collection. No evidence of acute infarct. No hydrocephalus.  Remote left occipital infarct. Remote lacunar infarct in left thalamus. Patchy white matter hypodensities compatible with chronic microvascular ischemic disease. Similar appearance of small remote infarcts in the bifrontal periventricular white matter. [place] Stroke Program Early CT (ASPECT) score: Ganglionic (caudate, internal capsule, lentiform nucleus, insula, M1-M3): 7. Supraganglionic (M4-M6): 3. Total: 10. ORBITS: No acute abnormality. SINUSES AND MASTOIDS: Opacified frontal and right maxillary sinus. Polypoid paranasal sinus mucosal thickening. SOFT TISSUES AND SKULL: No acute skull fracture. No acute soft tissue abnormality. Calcific atherosclerosis. IMPRESSION: 1. No acute intracranial abnormality. 2. ASPECTS score 10. 3. Remote infarcts and chronic microvascular ischemic disease. 4. Findings messaged to Dr. [name] at 2:21 PM on 12/31/23. Electronically signed by: Donnice Mania MD 12/31/2023 02:21 PM  EST RP Workstation: [id number]     Procedure Name: Intubation Date/Time: 12/31/2023 3:20 PM  Performed by: Yolande Lamar BROCKS, MDPre-anesthesia Checklist: Patient identified, Emergency Drugs available, Suction available and Timeout performed Oxygen Delivery Method: Ambu bag Preoxygenation: Pre-oxygenation with 100% oxygen Induction Type: IV induction and Rapid sequence Laryngoscope Size: Glidescope and 4 Grade View: Grade I Tube size: 8.0 mm Number of attempts: 1 Airway Equipment and Method: Rigid stylet and Video-laryngoscopy Placement Confirmation: ETT inserted through vocal cords under direct vision, Positive ETCO2, CO2 detector and Breath sounds checked- equal and bilateral Secured at: 26 cm Tube secured with: ETT holder Dental Injury: Teeth and Oropharynx as per pre-operative assessment  Difficulty Due To: Difficulty was anticipated       Medications Ordered in the ED  dexamethasone  (DECADRON ) injection 10 mg (has no administration in time range)  vancomycin  (VANCOCIN ) IVPB 1000  mg/200 mL premix (1,000 mg Intravenous Transfusing/Transfer 12/31/23 1841)  ketamine  (KETALAR ) 10 MG/ML injection (  Not Given 12/31/23 1554)  LORazepam  (ATIVAN ) 2 MG/ML injection (  Not Given 12/31/23 1555)  ketamine  HCl 50 MG/5ML SOSY (  Not Given 12/31/23 1555)  propofol  (DIPRIVAN ) 1000 MG/100ML infusion (  Not Given 12/31/23 1556)  0.9 %  sodium chloride  infusion (has no administration in time range)  norepinephrine  (LEVOPHED ) 4mg  in (0.016 mg/mL) premix infusion (5 mcg/min Intravenous New Bag/Given 12/31/23 1823)  norepinephrine  (LEVOPHED ) 4-5 MG/250ML-% infusion SOLN (  Not Given 12/31/23 1556)  fentaNYL  in NS (28mcg/ml) infusion-PREMIX (25 mcg/hr Intravenous Restarted 12/31/23 1814)  clevidipine  (CLEVIPREX ) infusion 0.5 mg/mL (has no administration in time range)  famotidine  (PEPCID ) tablet 20 mg (has no administration in time range)  docusate (COLACE) 50 MG/5ML liquid 100 mg (has no administration in time range)  polyethylene glycol (MIRALAX  / GLYCOLAX ) packet 17 g (has no administration in time range)  iohexol  (OMNIPAQUE ) 350 MG/ML injection 75 mL (75 mLs Intravenous Contrast Given 12/31/23 1424)  lacosamide  (VIMPAT ) 200 mg in sodium chloride  0.9 % 25 mL IVPB (200 mg Intravenous New Bag/Given 12/31/23 1519)  LORazepam  (ATIVAN ) injection 4 mg (4 mg Intravenous Given 12/31/23 1435)  cefTRIAXone  (ROCEPHIN ) 2 g in sodium chloride  0.9 % 100 mL IVPB (2 g Intravenous New Bag/Given 12/31/23 1726)  ketamine  50 mg in normal saline 5 mL (10 mg/mL) syringe (100 mg Intravenous Given 12/31/23 1533)  LORazepam  (ATIVAN ) injection 4 mg (4 mg Intravenous Given 12/31/23 1510)  rocuronium  (ZEMURON ) injection 100 mg (100 mg Intravenous Given 12/31/23 1516)  sodium chloride  0.9 % bolus 1,000 mL (1,000 mLs Intravenous Bolus 12/31/23 1535)  fentaNYL  (SUBLIMAZE ) 100 MCG/2ML injection (50 mcg  Given 12/31/23 1538)  sodium chloride  0.9 % bolus 1,000 mL (1,000 mLs Intravenous Bolus 12/31/23  1556)  iohexol  (OMNIPAQUE ) 300 MG/ML solution 150 mL (42 mLs Intra-arterial Contrast Given 12/31/23 1758)    Clinical Course as of 12/31/23 1949  Sun Dec 31, 2023  1415 Dr [name] from neurology consulted [RP]  1524 Dr Melven from critical care to admit [RP]    Clinical Course User Index [RP] Yolande Lamar BROCKS, MD                                 Medical Decision Making Amount and/or Complexity of Data Reviewed Labs: ordered. Radiology: ordered.  Risk Prescription drug management. Decision regarding hospitalization.   68 year old male with a history of seizures on valproic  acid and Keppra , atrial fibrillation on Coumadin , and stroke who  presents to the emergency department with altered mental status  Initial Ddx:  Status epilepticus, seizure, meningitis, URI, pneumonia, ICH, stroke  MDM/Course:  Patient presents to the emergency department with altered mental status.  Initially was concerned for seizure, ICH, or ischemic stroke.  Patient does not have any focal neurologic deficits but he is not following commands at all.  Does have some twitching that would be concerning for possible seizure.  Was also noted to be febrile on arrival.  Patient was taken to CT.  Dry head without bleed.  CTA did show left M2 occlusion.  Given his presentation was thought to be too high risk for TNK.  However, patient was made code IR and taken to [facility] for thrombectomy.  While in the emergency department was also treated for suspected status epilepticus with 2 rounds of Ativan  and loaded with lacosamide .  While in the emergency department upon re-evaluation he started to become more tachypneic and remained unresponsive.  He was intubated for airway protection at that point in time.  Did transiently become hypotensive afterwards and was given IV fluids and started on Levophed .  Also given antibiotics in case of meningitis.  Discussed with the ICU for admission after thrombectomy.  Was unable to reach his  legal guardian for consents or update.  This patient presents to the ED for concern of complaints listed in HPI, this involves an extensive number of treatment options, and is a complaint that carries with it a high risk of complications and morbidity. Disposition including potential need for admission considered.   Dispo: ICU  I have reviewed the patients home medications and made adjustments as needed Records reviewed Outpatient Clinic Notes The following labs were independently interpreted: Chemistry and show no acute abnormality I independently reviewed the following imaging with scope of interpretation limited to determining acute life threatening conditions related to emergency care: CT Head and agree with the radiologist interpretation with the following exceptions: none I personally reviewed and interpreted cardiac monitoring: sinus tachycardia I personally reviewed and interpreted the pt's EKG: see above for interpretation  Consults: Neurology Social Determinants of health:  Geriatric  CRITICAL CARE Performed by: Lamar JAYSON Shan   Total critical care time: 60 minutes  Critical care time was exclusive of separately billable procedures and treating other patients.  Critical care was necessary to treat or prevent imminent or life-threatening deterioration.  Critical care was time spent personally by me on the following activities: development of treatment plan with patient and/or surrogate as well as nursing, discussions with consultants, evaluation of patient's response to treatment, examination of patient, obtaining history from patient or surrogate, ordering and performing treatments and interventions, ordering and review of laboratory studies, ordering and review of radiographic studies, pulse oximetry and re-evaluation of patient's condition.   Portions of this note were generated with Scientist, clinical (histocompatibility and immunogenetics). Dictation errors may occur despite best attempts at  proofreading.     Final diagnoses:  Acute ischemic left MCA stroke (HCC)  Status epilepticus (HCC)  Hypoxia  Sepsis, due to unspecified organism, unspecified whether acute organ dysfunction present [facility])  Respiratory distress    ED Discharge Orders     None          Shan Lamar JAYSON, MD 12/31/23 1949  "

## 2023-12-31 NOTE — H&P (Incomplete)
 NEUROLOGY H&P NOTE   Date of service: December 31, 2023 Patient Name: Nathan Shepherd MRN:  [id number] DOB:  Jan 09, 1955 Chief Complaint: Acute onset of unresponsiveness  History of Present Illness  Tyton Abdallah is a 68 y.o. 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    He was last seen at his baseline at 10:30 AM by group home staff.  At 1:08 PM EMS was called for the patient being acutely altered.  They noted that he had right gaze deviation and route, brief left gaze deviation while entering the hospital, then gaze was midline.  No speech and not following commands.  Not moving any of his extremities to stimuli.  Not tracking or interacting, but eyes open and blinking to eyelash brush.   Over the course of Teleneurologist's evaluation he did have some further episodes of slight head tremoring and then left gaze deviation which was sustained and which could not be overcome with VOR performed by Dr. Jakie.    Ativan  4 mg was administered without change in gaze deviation or other appreciable effect He also was given 200 mg Vimpat . He was intubated for airway protection. BP did drop after intubation; he responded to IVF (2 liters ordered).   Difficult to fully determine his baseline but he does reside in a group home.  Is typically verbal and ambulatory per report from EMS but likely has some cognitive impairment at baseline (Dementia on Aricept  listed on last discharge in September).  Per physical therapy cognition comments close to time of last discharge is that he was oriented x 1 requiring max encouragement and repeated verbal cues to stay on task with  short shuffled steps and poor posture, max assist with a walker.  Unclear at this time how much he improved at his facility.  Teleneurology and ED Team were unable to reach his facility or legal guardian for additional collateral or to obtain consent   On chart review he has had multiple similar presentations with near obtundation in the setting of breakthrough seizures.  Semiology is typically described as a right gaze deviation with right sided facial twitching and right arm twitching.  He typically recovers gradually over several days.   CTA at [facility] revealed a left M2 occlusion. LKN was 10:30 AM. He was outside of the time window for TNK. Decision was made to transport emergently to [facility] for mechanical thrombectomy after emergent 2-physician consent was obtained.     ROS  Unable to perform due to intubation, sedation and unresponsiveness.   Past History   Past Medical History:  Diagnosis Date   CHF (congestive heart failure) (HCC)    CKD (chronic kidney disease) stage 3, GFR 30-59 ml/min (HCC)    Edema    Hyperlipemia    Hypertension    Seizures (HCC)    Past Surgical History:  Procedure Laterality Date   COLONOSCOPY N/A 03/24/2023   Procedure: COLONOSCOPY;  Surgeon: Cinderella [name], MD;  Location: [facility] ENDO [facility]ITE;  Service: Endoscopy;  Laterality: N/A;   ESOPHAGOGASTRODUODENOSCOPY N/A 03/24/2023   Procedure: EGD (ESOPHAGOGASTRODUODENOSCOPY);  Surgeon: Cinderella [name], MD;  Location: [facility] ENDO SUITE;  Service: Endoscopy;  Laterality: N/A;   HEMORRHOID SURGERY N/A 09/05/2023   Procedure:  EXTENSIVE HEMORRHOIDECTOMY;  Surgeon: [name] Manuelita BROCKS, MD;  Location: [facility] ORS;  Service: General;  Laterality: N/A;   NO PAST SURGERIES     TRANSURETHRAL RESECTION OF PROSTATE N/A 09/20/2023   Procedure: TURP (TRANSURETHRAL RESECTION OF PROSTATE);  Surgeon: Carolee Sherwood JONETTA DOUGLAS, MD;  Location: [facility] ORS;  Service: Urology;  Laterality: N/A;   Family History  Problem Relation Age of Onset   Colon  cancer Neg Hx    Liver disease Neg Hx    Social History   Socioeconomic History   Marital status: Single    Spouse name: Not on file   Number of children: Not on file   Years of education: Not on file   Highest education level: Not on file  Occupational History   Not on file  Tobacco Use   Smoking status: Never   Smokeless tobacco: Never  Vaping Use   Vaping status: Never Used  Substance and Sexual Activity   Alcohol use: Not Currently   Drug use: Never   Sexual activity: Not Currently  Other Topics Concern   Not on file  Social History Narrative   ** Merged History Encounter **       Social Drivers of Health   Tobacco Use: Low Risk (12/31/2023)   Patient History    Smoking Tobacco Use: Never    Smokeless Tobacco Use: Never    Passive Exposure: Not on file  Financial Resource Strain: Not on file  Food Insecurity: No Food Insecurity (09/19/2023)   Epic    Worried About Programme Researcher, Broadcasting/film/video in the Last Year: Never true    Ran Out of Food in the Last Year: Never true  Transportation Needs: No Transportation Needs (09/19/2023)   Epic    Lack of Transportation (Medical): No    Lack of Transportation (Non-Medical): No  Physical Activity: Not on file  Stress: Not on file  Social Connections: Socially Isolated (09/19/2023)   Social Connection and Isolation Panel    Frequency of Communication with Friends and Family: Three times a week    Frequency of Social Gatherings with Friends and Family: Three times a week    Attends Religious Services: Never    Active Member of Clubs or Organizations: No    Attends Banker Meetings: Never    Marital Status: Never married  Depression (PHQ2-9): Not on file  Alcohol Screen: Not on file  Housing: Low Risk (09/19/2023)   Epic    Unable to Pay for Housing in the Last Year: No    Number of Times Moved in the Last Year: 0    Homeless in the Last Year: No  Utilities: Not At Risk (09/19/2023)   Epic    Threatened with loss of  utilities: No  Health Literacy: Not on file   Allergies[1]  Medications  Current Medications[2]   Vitals   Vitals:   12/31/23 1530 12/31/23 1538 12/31/23 1545 12/31/23 1548  BP: (!) 71/60  (!) 123/93 (!) 123/96  Pulse: 93  96 90  Resp: 20  20 20   Temp: (!) 102.1 F (38.9 C)  (!) 101.8 F (38.8 C) (!) 101.7 F (38.7 C)  TempSrc:      SpO2: 100%  98% 100%  Height:  6' 4 (1.93 m)       Body mass index is 24.1 kg/m.  Physical Exam   Constitutional: Appears well-developed and well-nourished.   Eyes: No scleral injection.  HENT: Intubated Head: Normocephalic. No neck stiffness. Respiratory: Mechanically ventilated  Skin: WDI.   Neurologic Examination   Examined upon arrival to the [facility] ED and en route to the endovascular suite: Ment: Unresponsive on propofol  sedation. No purposeful responses to any external stimuli. No spontaneous movement.  CN: Pupils 2 mm and unreactive. Absent corneal reflexes. Face flaccidly symmetric.  Motor: Flaccid tone x 4 (recently intubated, currently with propofol  sedation) Sensory: No responses to noxious stimuli Reflexes: Hypoactive Cerebellar/Gait: Unable to assess  Labs   CBC:  Recent Labs  Lab 12/31/23 1434  WBC 15.5*  NEUTROABS 12.5*  HGB 13.5  HCT 42.6  MCV 84.0  PLT 189   Basic Metabolic Panel:  Lab Results  Component Value Date   NA 143 12/31/2023   K 3.2 (L) 12/31/2023   CO2 26 12/31/2023   GLUCOSE 115 (H) 12/31/2023   BUN 28 (H) 12/31/2023   CREATININE 1.11 12/31/2023   CALCIUM  9.7 12/31/2023   GFRNONAA >60 12/31/2023   GFRAA 59 (L) 12/15/2016   Lipid Panel:  Lab Results  Component Value Date   LDLCALC 37 08/18/2016   HgbA1c:  Lab Results  Component Value Date   HGBA1C 6.1 (H) 05/12/2022   Urine Drug Screen:     Component Value Date/Time   LABOPIA NEGATIVE 12/31/2023 1405   COCAINSCRNUR NEGATIVE 12/31/2023 1405   COCAINSCRNUR NONE DETECTED 10/22/2022 0304   LABBENZ NEGATIVE 12/31/2023 1405    AMPHETMU NEGATIVE 12/31/2023 1405   THCU NEGATIVE 12/31/2023 1405   LABBARB NEGATIVE 12/31/2023 1405    Alcohol Level     Component Value Date/Time   ETH <15 12/31/2023 1434   INR  Lab Results  Component Value Date   INR 1.2 12/31/2023   APTT  Lab Results  Component Value Date   APTT 27 12/31/2023       Assessment  Silver Argel Pablo is a 69 y.o. male presenting with unresponsiveness and left M2 occlusion on CTA.  - Exam upon arrival to [facility] is consistent with recent paralytic administration for RSI and propofol  sedation.  - This is a complex medical situation and with likely multiple coexisting pathologies.  Most emergently, patient does appear to have an acute left M2 occlusion which would explain his gaze deviation and being opposite of what is typically seen for his seizures (left gaze deviation instead of right gaze deviation).   - Given his overall very poor exam while at [facility], it was suspected that he may also have had breakthrough seizures in the setting of febrile illness, and he is also being treated for concern for status epilepticus.   - Given the complexity of decision making and initial concern for seizure, he was not within TNK treatment window by the time all the information was processed -- he came in very close to the end of the treatment window.  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  Teleneurology at [facility] and ED Team were unable to reach his legal guardian and therefore discussed the case with Dr. Lester (neuro IR) and decision was made to proceed with thrombectomy    Recommendations  # Acute M2 occlusion  - Out of TNK window - IR thrombectomy after discussion with Dr. Lester - Admitting to the Neuro ICU following IR - He has been admitted under CCM. Can be switched over to the Neurology Stroke Team  in the AM.  - Full stroke workup at  [facility] but etiology most likely cardioembolic from known afib not on anticogulation   # History of seizures; likely breakthrough seizure in the setting of fever - s/p Ativan  4 mg x 1 dose  - s/p Vimpat  loading dose of 200 mg  - Keppra  and Depakote  levels pending to confirm appropriate administration at facility - Home dose Keppra  1000 mg BID - Home dose Depakote  750 mg BID (consider alternative due to interactions with anticoagulation)   # Afib - Hold anticoagulation pending post-procedure MRI   # Fever - Flu panel sent at [facility] - Appreciate further workup by CCM / ED  ______________________________________________________________________   Bonney SHARK, Anesa Fronek, MD [facility]       [1]  Allergies Allergen Reactions   Penicillins Hives    Tolerated Zosyn   [2]  Current Facility-Administered Medications:    0.9 %  sodium chloride  infusion, 250 mL, Intravenous, Continuous, Yolande Lamar BROCKS, MD   cefTRIAXone  (ROCEPHIN ) 2 g in sodium chloride  0.9 % 100 mL IVPB, 2 g, Intravenous, Once, Yolande Lamar BROCKS, MD   dexamethasone  (DECADRON ) injection 10 mg, 10 mg, Intravenous, Once, Yolande Lamar BROCKS, MD   fentaNYL  in NS (26mcg/ml) infusion-PREMIX, 0-400 mcg/hr, Intravenous, Continuous, Yolande Lamar BROCKS, MD, Last Rate: 2.5 mL/hr at 12/31/23 1644, 25 mcg/hr at 12/31/23 1644   ketamine  (KETALAR ) 10 MG/ML injection, , , ,    ketamine  HCl 50 MG/5ML SOSY, , , ,    LORazepam  (ATIVAN ) 2 MG/ML injection, , , ,    norepinephrine  (LEVOPHED ) 4-5 MG/250ML-% infusion SOLN, , , ,    norepinephrine  (LEVOPHED ) 4mg  in (0.016 mg/mL) premix infusion, 0-10 mcg/min, Intravenous, Titrated, Yolande Lamar BROCKS, MD, Last Rate: 7.5 mL/hr at 12/31/23 1644, 2 mcg/min at 12/31/23 1644   propofol  (DIPRIVAN ) 1000 MG/100ML infusion, , , ,    vancomycin  (VANCOCIN ) IVPB 1000 mg/200 mL premix, 1,000 mg, Intravenous, Once, Yolande Lamar BROCKS,  MD  Facility-Administered Medications Ordered in Other Encounters:    0.9 %  sodium chloride  infusion, , Intravenous, Continuous PRN, Auston, Amanda M, CRNA, New Bag at 12/31/23 1644

## 2023-12-31 NOTE — Progress Notes (Signed)
 This is a complicated situation.  He has a seizure history, but on presentation the gaze was to the left whereas it is usually to the right with his typical seizures.  He was basically unresponsive with no purposeful movement.  However, his CTA shows what looks to be an acute occlusion of the dominant M2 division of the left middle cerebral artery.  There is not a basilar artery occlusion.  He has a history of atrial fibrillation and is off anticoagulation, and his CT shows a previous left posterior cerebral artery territory infarction.  Clearly all of his symptoms are not related to the left MCA occlusion.  There must be other factors such as his fever or seizures.  However, it is more likely than not that this is an acute left MCA occlusion as part of the picture, and possibly even the instigating event.  In my opinion, it is in his best interest to do a thrombectomy.  Dr. Clayburn and Dr. Merrianne from Vascular Neurology and I have discussed this case.  We have evaluated the patient's symptoms, imaging findings, and what we know of the patient's condition before the onset of the current stroke event.  We agree the patient is an appropriate candidate for mechanical thrombectomy and that, despite the risks of thrombectomy, emergent thrombectomy surgery offers the best possibility of neurological improvement.

## 2023-12-31 NOTE — ED Notes (Signed)
 Ativan  given at 1510 Ketamin given 1516 Rocuronium  given 1516 Intubated at 1518 26 at the teeth, color change noted, 8ET, OG 75fr

## 2023-12-31 NOTE — Anesthesia Postprocedure Evaluation (Signed)
"   Anesthesia Post Note  Patient: Eilam Shrewsbury  Procedure(s) Performed: RADIOLOGY WITH ANESTHESIA     Patient location during evaluation: SICU Anesthesia Type: General Level of consciousness: sedated Pain management: pain level controlled Vital Signs Assessment: post-procedure vital signs reviewed and stable Respiratory status: patient remains intubated per anesthesia plan Cardiovascular status: stable Postop Assessment: no apparent nausea or vomiting Anesthetic complications: no   No notable events documented.  Last Vitals:  Vitals:   12/31/23 1548 12/31/23 1807  BP: (!) 123/96   Pulse: 90 92  Resp: 20 20  Temp: (!) 38.7 C   SpO2: 100% 100%    Last Pain:  Vitals:   12/31/23 1439  TempSrc: Rectal                 [name]      "

## 2023-12-31 NOTE — Progress Notes (Signed)
" ° °  PCCM transfer request    Sending physician:Dr Yolande  Sending facility: [facility]  Reason for transfer: seizures  Brief case summary:  68 year old man history of seizure disorder, history of A-fib on warfarin, skilled nursing [place] resident found around 1 PM altered.  Right gaze preference nonverbal.  Hypertensive.  Brought to ED.  Remained hypertensive.  CT head with no bleed.  No obvious drug.  There was some MCA blockage with collaterals as well as vertebral blockage likely chronic.  Unclear if this is stroke.  Concern for clinical seizures.  Status epilepticus.  Neurology was consulted.  Started AEDs as well as Ativan .  Intubated for airway protection.  Prompting ICU admission.  Recommendations made prior to transfer: -- permissive HTN SBP goal of less than 160, possible HTN encephalopathy, discuss with neurology and if different goal then defer to Neurology goal, make sure blood pressure medicines are available to achieve goal.  Fortunately it sound like blood pressures improved after intubation. --Administer abx with fever --Consider propofol  for sedation if needed  Transfer accepted: Yes, 4 N ideally, needs neurology expertise as well as likely continuous video EEG   Nathan Shepherd [facility] 12/31/2023 3:24 PM [facility]  For contact information, see Amion. If no response to pager, please call PCCM consult pager. After hours, 7PM- 7AM, please call Elink.  "

## 2023-12-31 NOTE — Transfer of Care (Signed)
 Immediate Anesthesia Transfer of Care Note  [name]  Procedure(s) Performed: RADIOLOGY WITH ANESTHESIA  Patient Location: ICU  Anesthesia Type:General  Level of Consciousness: sedated, unresponsive, and Patient remains intubated per anesthesia plan  Airway & Oxygen Therapy: Patient remains intubated per anesthesia plan and Patient placed on Ventilator (see vital sign flow sheet for setting)  Post-op Assessment: Report given to RN and Post -op Vital signs reviewed and stable  Post vital signs: Reviewed and stable  Last Vitals:  Vitals Value Taken Time  BP 123/95 12/31/23 18:13  Temp    Pulse 69 12/31/23 18:14  Resp 23 12/31/23 18:14  SpO2 97 % 12/31/23 18:14  Vitals shown include unfiled device data.  Last Pain:  Vitals:   12/31/23 1439  TempSrc: Rectal         Complications: No notable events documented.

## 2023-12-31 NOTE — Anesthesia Preprocedure Evaluation (Addendum)
"                                    Anesthesia Evaluation  Patient identified by MRN, date of birth, ID band Patient unresponsive    Reviewed: Patient's Chart, lab work & pertinent test resultsPreop documentation limited or incomplete due to emergent nature of procedure.  Airway Mallampati: Intubated       Dental   Pulmonary    breath sounds clear to auscultation       Cardiovascular hypertension, Pt. on medications +CHF   Rhythm:Regular Rate:Normal     Neuro/Psych Seizures -,  CVA    GI/Hepatic   Endo/Other    Renal/GU CRFRenal disease     Musculoskeletal   Abdominal   Peds  Hematology   Anesthesia Other Findings   Reproductive/Obstetrics                              Anesthesia Physical Anesthesia Plan  ASA: 4 and emergent  Anesthesia Plan: General   Post-op Pain Management:    Induction: Inhalational  PONV Risk Score and Plan: 2 and Dexamethasone  and Ondansetron   Airway Management Planned: Oral ETT  Additional Equipment:   Intra-op Plan:   Post-operative Plan: Post-operative intubation/ventilation  Informed Consent: I have reviewed the patients History and Physical, chart, labs and discussed the procedure including the risks, benefits and alternatives for the proposed anesthesia with the patient or authorized representative who has indicated his/her understanding and acceptance.     Dental advisory given  Plan Discussed with:   Anesthesia Plan Comments:         Anesthesia Quick Evaluation  "

## 2023-12-31 NOTE — H&P (Signed)
 "  NAME:  Nathan Shepherd, MRN:  [id number], DOB:  April 28, 1955, LOS: 0 ADMISSION DATE:  12/31/2023, CONSULTATION DATE:  12/31/2023 REFERRING MD:  [name], CHIEF COMPLAINT:  cva   History of Present Illness:  Nathan Shepherd is a 68 y.o. 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  At 1 PM EMS was called because patient had noted right gaze deviation and was not answering questions or following commands.  He was taken to [facility] where he was out of the window for thrombolytic, he was treated with Vimpat  and Ativan  and had minimal subsequent seizure activity.  Him imaging reveals left M2 occlusion for which he was taken for IR thrombectomy at [facility].  2B TICI score after 3 passes without complication.  The patient was transferred to the ICU intubated.  Also had noted fevers earlier today though not on on arrival to the ICU.  Blood cultures and broad-spectrum antibiotics initiated.  Pertinent  Medical History   has a past medical history of CHF (congestive heart failure) (HCC), CKD (chronic kidney disease) stage 3, GFR 30-59 ml/min (HCC), Edema, Hyperlipemia, Hypertension, and Seizures (HCC).   Significant Hospital Events: Including procedures, antibiotic start and stop dates in addition to other pertinent events   12/28 left M2 occlusion and possible seizure activity, thrombectomy and transferred to the ICU intubated  Interim History / Subjective:  Patient required 5 mcg of nor epi on arrival to the ICU, this was weaned off and the patient was within SBP goal of 120-160  Objective    Blood pressure (!) 135/99, pulse 64, temperature (!) 101.7 F (38.7 C), resp. rate 20, height 6' 4 (1.93 m), SpO2 100%.    Vent Mode: PRVC FiO2 (%):  [50 %] 50 % Set Rate:  [20 bmp] 20 bmp Vt Set:  [650 mL] 650 mL PEEP:  [5 cmH20] 5  cmH20 Plateau Pressure:  [18 cmH20] 18 cmH20   Intake/Output Summary (Last 24 hours) at 12/31/2023 1910 Last data filed at 12/31/2023 1815 Gross per 24 hour  Intake 500 ml  Output 100 ml  Net 400 ml   There were no vitals filed for this visit.  General: Thin elderly male, intubated and sedated in no acute distress HEENT: MM pink/moist pupils 1 mm and equal Neuro: PERRLA, examined on sedation not responding to pain, minimal gag positive corneals CV: s1s2, atrial fibrillation with a rate in the 60-70s, no m/r/g PULM: Clear bilaterally on room air GI: soft, nondistended Extremities: warm/dry, no edema     Resolved problem list   Assessment and Plan   Acute CVA, large vessel occlusion left M2 Concern for breakthrough seizures Postop ventilator management -Management per stroke team -Serial neuroexams, goal normothermia, euglycemia, normoxia -SBP goal 120-160, Cleviprex  and Nor epi ordered as needed - Aspirin  and statin - Hemoglobin A1c, lipid panel - Neurology following, received Vimpat  loading dose of 200 mg at [facility], Keppra  and Depakote  levels are pending to guide further antiepileptic treatment - As needed Ativan  for seizure activity - LTM EEG --Maintain full vent support with SAT/SBT as tolerated -titrate Vent setting to maintain SpO2 greater than or equal to 90%. -HOB elevated 30 degrees. -Plateau pressures less than 30 cm H20.  -Follow chest x-ray, ABG prn.   -Bronchial hygiene and RT/bronchodilator protocol. - Probe and fentanyl  for PAD protocol  Fever Fever resolved upon arrival to the ICU -Tylenol  and cooling blanket as needed for normothermia - Blood cultures pending, CXR clear, UA bland - Continue broad-spectrum antibiotics for now, but can de-escalate tomorrow lactic acid pending   Atrial fibrillation - Not anticoagulated at baseline secondary to GI bleed, currently rate controlled, monitor on telemetry - Echo - Hold home Coreg  and  torsemide     Likely dementia - Resume Aricept  when extubated  Labs   CBC: Recent Labs  Lab 12/31/23 1434  WBC 15.5*  NEUTROABS 12.5*  HGB 13.5  HCT 42.6  MCV 84.0  PLT 189    Basic Metabolic Panel: Recent Labs  Lab 12/31/23 1434  NA 143  K 3.2*  CL 101  CO2 26  GLUCOSE 115*  BUN 28*  CREATININE 1.11  CALCIUM  9.7   GFR: CrCl cannot be calculated (Unknown ideal weight.). Recent Labs  Lab 12/31/23 1434  WBC 15.5*    Liver Function Tests: Recent Labs  Lab 12/31/23 1434  AST 24  ALT 6  ALKPHOS 71  BILITOT 0.7  PROT 7.5  ALBUMIN 3.5   No results for input(s): LIPASE, AMYLASE in the last 168 hours. Recent Labs  Lab 12/31/23 1434  AMMONIA 27    ABG    Component Value Date/Time   PHART 7.47 (H) 05/12/2022 1130   PCO2ART 43 05/12/2022 1130   PO2ART 165 (H) 05/12/2022 1130   HCO3 31.3 (H) 05/12/2022 1130   TCO2 22 08/21/2021 1821   ACIDBASEDEF 1.0 08/21/2021 1821   O2SAT 99.2 05/12/2022 1130     Coagulation Profile: Recent Labs  Lab 12/31/23 1434  INR 1.2    Cardiac Enzymes: Recent Labs  Lab 12/31/23 1434  CKTOTAL 43*    HbA1C: Hgb A1c MFr Bld  Date/Time Value Ref Range Status  05/12/2022 03:09 PM 6.1 (H) 4.8 - 5.6 % Final    Comment:    (NOTE) Pre diabetes:          5.7%-6.4%  Diabetes:              >6.4%  Glycemic control for   <7.0% adults with diabetes   08/18/2016 02:35 AM 6.4 (H) 4.8 - 5.6 % Final    Comment:    (NOTE) Pre diabetes:          5.7%-6.4% Diabetes:              >6.4% Glycemic control for   <7.0% adults with diabetes     CBG: Recent Labs  Lab 12/31/23 1402  GLUCAP 126*    Review of Systems:   Unable to obtain  Past Medical History:  He,  has a past medical history of CHF (congestive heart failure) (HCC), CKD (chronic kidney disease) stage 3, GFR 30-59 ml/min (HCC), Edema, Hyperlipemia, Hypertension, and Seizures (HCC).   Surgical History:   Past Surgical History:  Procedure  Laterality Date   COLONOSCOPY N/A 03/24/2023   Procedure: COLONOSCOPY;  Surgeon: Cinderella [name], MD;  Location: [facility];  Service: Endoscopy;  Laterality: N/A;   ESOPHAGOGASTRODUODENOSCOPY N/A 03/24/2023   Procedure: EGD (ESOPHAGOGASTRODUODENOSCOPY);  Surgeon: Cinderella [name], MD;  Location: [facility];  Service: Endoscopy;  Laterality: N/A;   HEMORRHOID SURGERY N/A 09/05/2023   Procedure: EXTENSIVE HEMORRHOIDECTOMY;  Surgeon: [name] Manuelita BROCKS, MD;  Location: [facility] ORS;  Service: General;  Laterality: N/A;   NO PAST SURGERIES     TRANSURETHRAL RESECTION OF PROSTATE N/A 09/20/2023   Procedure: TURP (TRANSURETHRAL RESECTION OF PROSTATE);  Surgeon:  Carolee Sherwood JONETTA DOUGLAS, MD;  Location: [facility];  Service: Urology;  Laterality: N/A;     Social History:   reports that he has never smoked. He has never used smokeless tobacco. He reports that he does not currently use alcohol. He reports that he does not use drugs.   Family History:  His family history is negative for Colon cancer and Liver disease.   Allergies Allergies[1]   Home Medications  Prior to Admission medications  Medication Sig Start Date End Date Taking? Authorizing Provider  acetaminophen  (TYLENOL ) 325 MG tablet Take 2 tablets (650 mg total) by mouth every 6 (six) hours as needed for mild pain (pain score 1-3), moderate pain (pain score 4-6) or fever. 09/24/23  Yes Hongalgi, Anand D, MD  aluminum-magnesium  hydroxide-simethicone (MAALOX) 200-200-20 MG/5ML SUSP Take 30 mLs by mouth every 6 (six) hours as needed (indigestion, heartburn).   Yes [provider]  carvedilol  (COREG ) 3.125 MG tablet Take 1 tablet (3.125 mg total) by mouth 2 (two) times daily. 09/07/23 09/06/24 Yes Emokpae, Courage, MD  Cholecalciferol (VITAMIN D3) 1.25 MG (50000 UT) CAPS Take 50,000 Units by mouth every Monday.   Yes [provider]  diphenhydrAMINE  (BENADRYL ) 25 MG tablet Take 25 mg by mouth every 8 (eight) hours as needed for  allergies or itching.   Yes [provider]  divalproex  (DEPAKOTE ) 250 MG DR tablet Take 3 tablets (750 mg total) by mouth every 12 (twelve) hours. 10/24/22 12/31/23 Yes Sreenath, Sudheer B, MD  donepezil  (ARICEPT ) 5 MG tablet Take 5 mg by mouth at bedtime.   Yes [provider]  fluticasone -salmeterol (ADVAIR) 500-50 MCG/ACT AEPB Inhale 1 puff into the lungs in the morning and at bedtime.   Yes [provider]  levETIRAcetam  (KEPPRA ) 1000 MG tablet Take 1,000 mg by mouth 2 (two) times daily.   Yes [provider]  Multiple Vitamin (MULTIVITAMIN WITH MINERALS) TABS tablet Take 1 tablet by mouth daily. 09/25/23  Yes Hongalgi, Anand D, MD  Nutritional Supplements (NUTRITIONAL SUPPLEMENT PO) Take 1 Container by mouth 2 (two) times daily. Magic Cup with breakfast and lunch   Yes [provider]  omeprazole  (PRILOSEC) 20 MG capsule Take 1 capsule (20 mg total) by mouth 2 (two) times daily before a meal. Take 30 min before breakfast and 30 min before dinner 09/07/23  Yes Emokpae, Courage, MD  ondansetron  (ZOFRAN ) 4 MG tablet Take 1 tablet (4 mg total) by mouth every 6 (six) hours as needed for nausea. 09/07/23  Yes Pearlean Manus, MD  polyethylene glycol (MIRALAX ) 17 g packet Take 17 g by mouth every Monday, Wednesday, and Friday. 09/08/23  Yes Emokpae, Courage, MD  rosuvastatin  (CRESTOR ) 20 MG tablet Take 20 mg by mouth at bedtime.    Yes [provider]  senna-docusate (SENOKOT-S) 8.6-50 MG tablet Take 2 tablets by mouth at bedtime. 09/07/23 09/06/24 Yes Emokpae, Courage, MD  torsemide  (DEMADEX ) 20 MG tablet Take 20 mg by mouth every morning.   Yes [provider]  SYMBICORT  160-4.5 MCG/ACT inhaler Inhale 2 puffs into the lungs in the morning and at bedtime. Patient not taking: Reported on 12/31/2023 09/07/23   Pearlean Manus, MD     Critical care time: 45 minutes     CRITICAL CARE Performed by: Leita SAUNDERS Dorsel Flinn   Total critical care time: 45  minutes  Critical care time was exclusive of separately billable procedures and treating other patients.  Critical care was necessary to treat or prevent imminent or life-threatening deterioration.  Critical care was time spent personally by me on the following activities: development of treatment plan with patient and/or surrogate as well as nursing, discussions with consultants, evaluation of patient's response to treatment, examination of patient, obtaining history from patient or surrogate, ordering and performing treatments and interventions, ordering and review of laboratory studies, ordering and review of radiographic studies, pulse oximetry and re-evaluation of patient's condition.    Leita SAUNDERS Sharone Picchi, PA-C [facility] See Amion for pager If no response to pager , please call [phone] until 7pm After 7:00 pm call Elink  [phone]       [1]  Allergies Allergen Reactions   Penicillins Hives    Tolerated Zosyn    "

## 2023-12-31 NOTE — Anesthesia Procedure Notes (Signed)
 Arterial Line Insertion Start/End12/28/2025 5:00 PM, 12/31/2023 5:03 PM Performed by: Epifanio Charleston, MD, anesthesiologist  Patient location: Pre-op. Preanesthetic checklist: patient identified, IV checked, site marked, risks and benefits discussed, surgical consent, monitors and equipment checked, pre-op evaluation, timeout performed and anesthesia consent Lidocaine  1% used for infiltration Left, radial was placed Catheter size: 20 G Hand hygiene performed  and maximum sterile barriers used   Attempts: 1 Following insertion, dressing applied. Post procedure assessment: normal and unchanged  Patient tolerated the procedure well with no immediate complications.

## 2023-12-31 NOTE — Brief Op Note (Signed)
" °  NEUROSURGERY BRIEF THROMBECTOMY NOTE   PREOP DX: Left mca embolus  POSTOP DX: Same  PROCEDURE: thrombectomy  SURGEON: Nancyann LULLA Burns   ANESTHESIA: GETA  EBL: Minimal  Number of Passes: 3  Technique: ASPIRATION  Final TICI score: 2B  Post OP blood pressure goal: SBP<160  Arterial Angioplasty or Stent: No   Anti-Platelet Therapy: No   COMPLICATIONS: No   CONDITION: Stable to recovery  FINDINGS (Full report in [facility]): 1. Embolus to posterior M2 (supplied 75% mca) 2. Embolus to M3 anterior division (unsuccesful one pass) 3. TICI 2 B with anterior division M3 occlusion with good collaterals   Nancyann LULLA Burns  @today @ 5:36 PM  "

## 2024-01-01 ENCOUNTER — Inpatient Hospital Stay (HOSPITAL_COMMUNITY)

## 2024-01-01 ENCOUNTER — Encounter (HOSPITAL_COMMUNITY): Payer: Self-pay | Admitting: Radiology

## 2024-01-01 DIAGNOSIS — I509 Heart failure, unspecified: Secondary | ICD-10-CM

## 2024-01-01 DIAGNOSIS — I63412 Cerebral infarction due to embolism of left middle cerebral artery: Secondary | ICD-10-CM

## 2024-01-01 DIAGNOSIS — I110 Hypertensive heart disease with heart failure: Secondary | ICD-10-CM | POA: Diagnosis not present

## 2024-01-01 DIAGNOSIS — I6389 Other cerebral infarction: Secondary | ICD-10-CM | POA: Diagnosis not present

## 2024-01-01 DIAGNOSIS — R569 Unspecified convulsions: Secondary | ICD-10-CM

## 2024-01-01 DIAGNOSIS — D72829 Elevated white blood cell count, unspecified: Secondary | ICD-10-CM

## 2024-01-01 DIAGNOSIS — Z87448 Personal history of other diseases of urinary system: Secondary | ICD-10-CM | POA: Diagnosis not present

## 2024-01-01 DIAGNOSIS — I4891 Unspecified atrial fibrillation: Secondary | ICD-10-CM | POA: Diagnosis not present

## 2024-01-01 DIAGNOSIS — I63512 Cerebral infarction due to unspecified occlusion or stenosis of left middle cerebral artery: Secondary | ICD-10-CM | POA: Diagnosis not present

## 2024-01-01 DIAGNOSIS — I69391 Dysphagia following cerebral infarction: Secondary | ICD-10-CM | POA: Diagnosis not present

## 2024-01-01 DIAGNOSIS — E785 Hyperlipidemia, unspecified: Secondary | ICD-10-CM

## 2024-01-01 DIAGNOSIS — R29729 NIHSS score 29: Secondary | ICD-10-CM | POA: Diagnosis not present

## 2024-01-01 DIAGNOSIS — N179 Acute kidney failure, unspecified: Secondary | ICD-10-CM | POA: Diagnosis not present

## 2024-01-01 LAB — BLOOD GAS, VENOUS
Acid-Base Excess: 1.6 mmol/L (ref 0.0–2.0)
Bicarbonate: 22.7 mmol/L (ref 20.0–28.0)
Drawn by: 7897
O2 Saturation: 100 %
Patient temperature: 37
pCO2, Ven: 26 mmHg — ABNORMAL LOW (ref 44–60)
pH, Ven: 7.55 — ABNORMAL HIGH (ref 7.25–7.43)
pO2, Ven: 182 mmHg — ABNORMAL HIGH (ref 32–45)

## 2024-01-01 LAB — MAGNESIUM
Magnesium: 1.5 mg/dL — ABNORMAL LOW (ref 1.7–2.4)
Magnesium: 1.6 mg/dL — ABNORMAL LOW (ref 1.7–2.4)

## 2024-01-01 LAB — POCT I-STAT 7, (LYTES, BLD GAS, ICA,H+H)
Acid-Base Excess: 0 mmol/L (ref 0.0–2.0)
Bicarbonate: 23.6 mmol/L (ref 20.0–28.0)
Calcium, Ion: 1.22 mmol/L (ref 1.15–1.40)
HCT: 36 % — ABNORMAL LOW (ref 39.0–52.0)
Hemoglobin: 12.2 g/dL — ABNORMAL LOW (ref 13.0–17.0)
O2 Saturation: 100 %
Patient temperature: 98.6
Potassium: 4.5 mmol/L (ref 3.5–5.1)
Sodium: 142 mmol/L (ref 135–145)
TCO2: 25 mmol/L (ref 22–32)
pCO2 arterial: 32.7 mmHg (ref 32–48)
pH, Arterial: 7.466 — ABNORMAL HIGH (ref 7.35–7.45)
pO2, Arterial: 179 mmHg — ABNORMAL HIGH (ref 83–108)

## 2024-01-01 LAB — ECHOCARDIOGRAM COMPLETE
AR max vel: 2.14 cm2
AV Area VTI: 2.16 cm2
AV Area mean vel: 2.05 cm2
AV Mean grad: 3 mmHg
AV Peak grad: 4.9 mmHg
Ao pk vel: 1.1 m/s
Area-P 1/2: 5.84 cm2
Height: 76 in
S' Lateral: 3.2 cm
Weight: 3167.57 [oz_av]

## 2024-01-01 LAB — GLUCOSE, CAPILLARY
Glucose-Capillary: 106 mg/dL — ABNORMAL HIGH (ref 70–99)
Glucose-Capillary: 112 mg/dL — ABNORMAL HIGH (ref 70–99)
Glucose-Capillary: 118 mg/dL — ABNORMAL HIGH (ref 70–99)
Glucose-Capillary: 118 mg/dL — ABNORMAL HIGH (ref 70–99)
Glucose-Capillary: 144 mg/dL — ABNORMAL HIGH (ref 70–99)
Glucose-Capillary: 144 mg/dL — ABNORMAL HIGH (ref 70–99)

## 2024-01-01 LAB — LIPID PANEL
Cholesterol: 108 mg/dL (ref 0–200)
HDL: 48 mg/dL
LDL Cholesterol: 46 mg/dL (ref 0–99)
Total CHOL/HDL Ratio: 2.3 ratio
Triglycerides: 68 mg/dL
VLDL: 14 mg/dL (ref 0–40)

## 2024-01-01 LAB — CBC
HCT: 38.2 % — ABNORMAL LOW (ref 39.0–52.0)
Hemoglobin: 12.7 g/dL — ABNORMAL LOW (ref 13.0–17.0)
MCH: 27 pg (ref 26.0–34.0)
MCHC: 33.2 g/dL (ref 30.0–36.0)
MCV: 81.1 fL (ref 80.0–100.0)
Platelets: 169 K/uL (ref 150–400)
RBC: 4.71 MIL/uL (ref 4.22–5.81)
RDW: 14 % (ref 11.5–15.5)
WBC: 18 K/uL — ABNORMAL HIGH (ref 4.0–10.5)
nRBC: 0 % (ref 0.0–0.2)

## 2024-01-01 LAB — BASIC METABOLIC PANEL WITH GFR
Anion gap: 14 (ref 5–15)
BUN: 34 mg/dL — ABNORMAL HIGH (ref 8–23)
CO2: 23 mmol/L (ref 22–32)
Calcium: 9.4 mg/dL (ref 8.9–10.3)
Chloride: 105 mmol/L (ref 98–111)
Creatinine, Ser: 1.51 mg/dL — ABNORMAL HIGH (ref 0.61–1.24)
GFR, Estimated: 51 mL/min — ABNORMAL LOW
Glucose, Bld: 145 mg/dL — ABNORMAL HIGH (ref 70–99)
Potassium: 3.7 mmol/L (ref 3.5–5.1)
Sodium: 141 mmol/L (ref 135–145)

## 2024-01-01 LAB — LACTIC ACID, PLASMA
Lactic Acid, Venous: 3.1 mmol/L (ref 0.5–1.9)
Lactic Acid, Venous: 2.4 mmol/L (ref 0.5–1.9)

## 2024-01-01 LAB — TRIGLYCERIDES: Triglycerides: 69 mg/dL

## 2024-01-01 LAB — PHOSPHORUS: Phosphorus: 2 mg/dL — ABNORMAL LOW (ref 2.5–4.6)

## 2024-01-01 MED ORDER — THIAMINE MONONITRATE 100 MG PO TABS
100.0000 mg | ORAL_TABLET | Freq: Every day | ORAL | Status: AC
  Administered 2024-01-01 – 2024-01-07 (×7): 100 mg
  Filled 2024-01-01 (×6): qty 1

## 2024-01-01 MED ORDER — IPRATROPIUM-ALBUTEROL 0.5-2.5 (3) MG/3ML IN SOLN
3.0000 mL | Freq: Four times a day (QID) | RESPIRATORY_TRACT | Status: DC | PRN
  Administered 2024-01-07: 3 mL via RESPIRATORY_TRACT
  Filled 2024-01-01: qty 3

## 2024-01-01 MED ORDER — LABETALOL HCL 5 MG/ML IV SOLN: 10.0000 mg | INTRAVENOUS | Status: DC | PRN

## 2024-01-01 MED ORDER — VANCOMYCIN HCL 750 MG/150ML IV SOLN
750.0000 mg | Freq: Two times a day (BID) | INTRAVENOUS | Status: DC
  Filled 2024-01-01: qty 150

## 2024-01-01 MED ORDER — PROSOURCE TF20 ENFIT COMPATIBL EN LIQD
60.0000 mL | Freq: Two times a day (BID) | ENTERAL | Status: DC
  Administered 2024-01-01 – 2024-01-04 (×8): 60 mL
  Filled 2024-01-01 (×8): qty 60

## 2024-01-01 MED ORDER — OSMOLITE 1.5 CAL PO LIQD
1000.0000 mL | ORAL | Status: DC
  Administered 2024-01-01 – 2024-01-04 (×4): 1000 mL

## 2024-01-01 MED ORDER — SODIUM CHLORIDE 0.9 % IV SOLN
2.0000 g | INTRAVENOUS | Status: DC
  Administered 2024-01-01 – 2024-01-03 (×3): 2 g via INTRAVENOUS
  Filled 2024-01-01 (×3): qty 20

## 2024-01-01 MED ORDER — POTASSIUM PHOSPHATES 15 MMOLE/5ML IV SOLN
30.0000 mmol | Freq: Once | INTRAVENOUS | Status: AC
  Administered 2024-01-01: 30 mmol via INTRAVENOUS
  Filled 2024-01-01: qty 10

## 2024-01-01 MED ORDER — ACETAMINOPHEN 160 MG/5ML PO SOLN
650.0000 mg | Freq: Four times a day (QID) | ORAL | Status: DC | PRN
  Administered 2024-01-02 – 2024-01-05 (×5): 650 mg
  Filled 2024-01-01 (×5): qty 20.3

## 2024-01-01 MED ORDER — ASPIRIN 300 MG RE SUPP
300.0000 mg | Freq: Every day | RECTAL | Status: DC
  Filled 2024-01-01: qty 1

## 2024-01-01 MED ORDER — VANCOMYCIN HCL 750 MG/150ML IV SOLN
750.0000 mg | Freq: Two times a day (BID) | INTRAVENOUS | Status: DC
  Administered 2024-01-01 – 2024-01-02 (×2): 750 mg via INTRAVENOUS
  Filled 2024-01-01 (×3): qty 150

## 2024-01-01 MED ORDER — STROKE: EARLY STAGES OF RECOVERY BOOK: Freq: Once | Status: AC

## 2024-01-01 MED ORDER — VALPROATE SODIUM 100 MG/ML IV SOLN
750.0000 mg | Freq: Two times a day (BID) | INTRAVENOUS | Status: AC
  Administered 2024-01-01 – 2024-01-07 (×14): 750 mg via INTRAVENOUS
  Filled 2024-01-01: qty 7.5
  Filled 2024-01-01: qty 5
  Filled 2024-01-01 (×14): qty 7.5

## 2024-01-01 MED ORDER — DONEPEZIL HCL 10 MG PO TABS
5.0000 mg | ORAL_TABLET | Freq: Every day | ORAL | Status: AC
  Administered 2024-01-01 – 2024-01-05 (×5): 5 mg
  Filled 2024-01-01 (×5): qty 1

## 2024-01-01 MED ORDER — MAGNESIUM SULFATE 4 GM/100ML IV SOLN
4.0000 g | Freq: Once | INTRAVENOUS | Status: AC
  Administered 2024-01-01: 4 g via INTRAVENOUS
  Filled 2024-01-01: qty 100

## 2024-01-01 MED ORDER — PANTOPRAZOLE SODIUM 40 MG IV SOLR
40.0000 mg | Freq: Two times a day (BID) | INTRAVENOUS | Status: DC
  Administered 2024-01-01 – 2024-01-07 (×13): 40 mg via INTRAVENOUS
  Filled 2024-01-01 (×13): qty 10

## 2024-01-01 MED ORDER — HEPARIN SODIUM (PORCINE) 5000 UNIT/ML IJ SOLN
5000.0000 [IU] | Freq: Three times a day (TID) | INTRAMUSCULAR | Status: DC
  Administered 2024-01-01 – 2024-01-07 (×17): 5000 [IU] via SUBCUTANEOUS
  Filled 2024-01-01 (×17): qty 1

## 2024-01-01 MED ORDER — HYDRALAZINE HCL 20 MG/ML IJ SOLN: 10.0000 mg | INTRAMUSCULAR | Status: DC | PRN

## 2024-01-01 MED ORDER — LEVETIRACETAM (KEPPRA) 500 MG/5 ML ADULT IV PUSH
1000.0000 mg | Freq: Two times a day (BID) | INTRAVENOUS | Status: DC
  Administered 2024-01-01 – 2024-01-02 (×4): 1000 mg via INTRAVENOUS
  Filled 2024-01-01 (×4): qty 10

## 2024-01-01 MED ORDER — LACTATED RINGERS IV SOLN: INTRAVENOUS | Status: DC

## 2024-01-01 MED ORDER — ASPIRIN 81 MG PO CHEW
81.0000 mg | CHEWABLE_TABLET | Freq: Every day | ORAL | Status: DC
  Administered 2024-01-01 – 2024-01-07 (×7): 81 mg
  Filled 2024-01-01 (×6): qty 1

## 2024-01-01 MED ORDER — FENTANYL CITRATE (PF) 50 MCG/ML IJ SOSY
50.0000 ug | PREFILLED_SYRINGE | INTRAMUSCULAR | Status: DC | PRN
  Administered 2024-01-02 – 2024-01-04 (×2): 50 ug via INTRAVENOUS
  Filled 2024-01-01 (×2): qty 1

## 2024-01-01 NOTE — Progress Notes (Signed)
 LTM EEG hooked up and running - no initial skin breakdown - push button tested - [facility] monitoring.

## 2024-01-01 NOTE — Progress Notes (Signed)
 Pt transported to MRI and back via ventilator w/o complications. RT and RN assisted with this transport.

## 2024-01-01 NOTE — Progress Notes (Signed)
 Pharmacy Antibiotic Note  Nathan Shepherd is a 68 y.o. male admitted on 12/31/2023 with sepsis.  Pharmacy has been consulted for ceftriaxone , vancomycin  dosing.  Plan: S/p vancomycin  1g IV x1 yesterday -- Start vancomycin  750mg  IV q 12 (eAUC 434, Scr 1.51)  Goal trough 15-37mcg/mL, Goal AUC 400-600 -F/u renal function, LOT, and culture data -F/u vancomycin  levels PRN per protocol  Ceftriaxone  2g IV q24h    Height: 6' 4 (193 cm) Weight: 89.8 kg (197 lb 15.6 oz) IBW/kg (Calculated) : 86.8  Temp (24hrs), Avg:100.7 F (38.2 C), Min:97.8 F (36.6 C), Max:102.8 F (39.3 C)  Recent Labs  Lab 12/31/23 1412 12/31/23 1434 12/31/23 1849 12/31/23 2057 01/01/24 0455  WBC  --  15.5*  --   --  18.0*  CREATININE 1.40* 1.11  --   --  1.51*  LATICACIDVEN  --   --  3.5* 2.3* 3.1*    Estimated Creatinine Clearance: 59.1 mL/min (A) (by C-G formula based on SCr of 1.51 mg/dL (H)).    Allergies[1]  Antimicrobials this admission: Vanc 12/28> Ceftriaxone  12/28>  Dose adjustments this admission:   Microbiology results: 12/28 RVP neg 12/28 MRSA pos 12/28 BCX   Thank you for allowing pharmacy to be a part of this patients care.  Sharyne Glatter, PharmD, BCCCP Critical Care Clinical Pharmacist 01/01/2024 11:11 AM     [1]  Allergies Allergen Reactions   Penicillins Hives    Tolerated Zosyn 

## 2024-01-01 NOTE — Progress Notes (Signed)
 Initial Nutrition Assessment  DOCUMENTATION CODES:   Severe malnutrition in context of chronic illness  INTERVENTION:  Initiate tube feeding via OG tube: Osmolite 1.5 at 25 ml/h, increase by 10 ml/h every 12 hours to goal rate of 65 ml/h (1560 ml per day) Prosource TF20 60 ml BID Provides 2500 kcal, 138 gm protein, 1189 ml free water  daily Monitor magnesium , potassium, and phosphorus daily for at least 3 days, MD to replete as needed, as pt is at risk for refeeding syndrome given severe malnutrition.  Add Thiamine  100 mg daily for 7 days.  NUTRITION DIAGNOSIS:  Severe Malnutrition related to chronic illness (CHF, CKD) as evidenced by severe muscle depletion, severe fat depletion.  GOAL:  Patient will meet greater than or equal to 90% of their needs  MONITOR:  Vent status, TF tolerance  REASON FOR ASSESSMENT:  Consult Enteral/tube feeding initiation and management  ASSESSMENT:  68 yo male admitted with acute L MCA CVA, L M2 occlusion. PMH includes CHF, CKD, HLD, HTN, seizures, baseline cognitive deficit, possible dementia.  Patient is currently intubated on ventilator support MV: 13.5 L/min Temp (24hrs), Avg:100.7 F (38.2 C), Min:97.8 F (36.6 C), Max:102.8 F (39.3 C)  Received MD Consult for TF initiation and management. OG tube in place.  Weight history reviewed. Weight has been stable for at least the past year. However, patient meets criteria for severe malnutrition, given severe depletion of muscle and subcutaneous fat mass. Unsure of usual intake PTA. Patient is at risk for refeeding syndrome. Will advance TF slowly, start thiamine , and check refeeding labs.   Labs reviewed.  Phos 2 Mag 1.6 <-- 1.5 CBG: [phone]  Medications reviewed and include colace, novolog , keppra , protonix , miralax , fentanyl , magnesium  sulfate, potassium phosphate .  NUTRITION - FOCUSED PHYSICAL EXAM: Flowsheet Row Most Recent Value  Orbital Region Severe depletion  Upper Arm  Region Unable to assess  Thoracic and Lumbar Region Severe depletion  Buccal Region Unable to assess  Temple Region Severe depletion  Clavicle Bone Region Severe depletion  Clavicle and Acromion Bone Region Severe depletion  Scapular Bone Region Unable to assess  Dorsal Hand Unable to assess  Patellar Region Severe depletion  Anterior Thigh Region Severe depletion  Posterior Calf Region Severe depletion  Edema (RD Assessment) None  Hair Reviewed  Eyes Unable to assess  Mouth Unable to assess  Skin Reviewed  Nails Reviewed    Diet Order:   Diet Order             Diet NPO time specified  Diet effective now                   EDUCATION NEEDS:  No education needs have been identified at this time  Skin:  Skin Assessment: Reviewed RN Assessment  Last BM:  PTA  Height:  Ht Readings from Last 1 Encounters:  01/01/24 6' 4 (1.93 m)   Weight:  Wt Readings from Last 1 Encounters:  12/31/23 89.8 kg   Ideal Body Weight:  91.8 kg  BMI:  Body mass index is 24.1 kg/m.  Estimated Nutritional Needs:  Kcal:  2400-2600 Protein:  120-135 gm Fluid:  2.4-2.6 L   Suzen HUNT RD, LDN, CNSC Contact via secure chat. If unavailable, use group chat RD Inpatient.

## 2024-01-01 NOTE — Plan of Care (Addendum)
 Patient admitted from [facility] following BIB EMS from facility due to right sided gaze preference, altered mental status, and non verbal. CT showed M2 Occlusion and transferred to [facility] for thrombectomy which was performed by Dr. Lester which resulted in a TICI 2B revascularization. Patient was brought to 4NICU intubated and sedated for monitoring. 12/29: sedation turned off and patient is responding to pain on the left upper extremity and will move the extremity intermittently. Patient to remain off sedation, PRN's available as needed. Failed weaning trial this morning.  Problem: Clinical Measurements: Goal: Ability to maintain clinical measurements within normal limits will improve Outcome: Progressing   Problem: Clinical Measurements: Goal: Respiratory complications will improve Outcome: Progressing   Problem: Coping: Goal: Level of anxiety will decrease Outcome: Progressing   Problem: Education: Goal: Understanding of CV disease, CV risk reduction, and recovery process will improve Outcome: Progressing   Problem: Cardiovascular: Goal: Vascular access site(s) Level 0-1 will be maintained Outcome: Progressing   Problem: Cardiovascular: Goal: Ability to achieve and maintain adequate cardiovascular perfusion will improve Outcome: Progressing

## 2024-01-01 NOTE — Progress Notes (Signed)
 200ml of fentanyl  drip wasted with Melody RN and Aleck PEAK in the streicycle.

## 2024-01-01 NOTE — TOC Initial Note (Signed)
 Transition of Care [facility]) - Initial/Assessment Note    Patient Details  Name: Nathan Shepherd MRN: [id number] Date of Birth: 11/04/1955  Transition of Care [facility]) CM/SW Contact:    Jylan Loeza E Heith Haigler, [name] Phone Number: 01/01/2024, 1:21 PM  Clinical Narrative:                 Patient was admitted from [facility] for altered mental status. Patient is currently intubated in the ICU. CSW called patient's Legal [place] through [facility]. Symphanie confirms patient is from [place] as a long term care resident, and that she is the main contact for decision making for the patient at [phone]. CSW also called Isaiah with [facility] - left VM.   2:30- Received return call from [facility] Rep stating patient does reside there and can return when appropriate.  Expected Discharge Plan: Skilled Nursing Facility Barriers to Discharge: Continued Medical Work up   Patient Goals and CMS Choice   CMS Medicare.gov Compare Post Acute Care list provided to:: Patient Represenative (must comment) Choice offered to / list presented to : [facility] POA / Guardian      Expected Discharge Plan and Services       Living arrangements for the past 2 months: Skilled Nursing Facility                                      Prior Living Arrangements/Services Living arrangements for the past 2 months: Skilled Nursing Facility Lives with:: Facility Resident Patient language and need for interpreter reviewed:: Yes Do you feel safe going back to the place where you live?: Yes            Criminal Activity/Legal Involvement Pertinent to Current Situation/Hospitalization: No - Comment as needed  Activities of Daily Living      Permission Sought/Granted Permission sought to share information with : Facility Industrial/product Designer granted to share information with : Yes, Verbal Permission Granted (by legal guardian)     Permission granted to  share info w AGENCY: [facility]        Emotional Assessment         Alcohol / Substance Use: Not Applicable Psych Involvement: No (comment)  Admission diagnosis:  Status epilepticus (HCC) [G40.901] Patient Active Problem List   Diagnosis Date Noted   Status epilepticus (HCC) 12/31/2023   Abscess 09/18/2023   ABLA (acute blood loss anemia) 09/18/2023   Protein-calorie malnutrition, severe 09/18/2023   Somnolence 09/11/2023   FUO (fever of unknown origin) 09/11/2023   Acute cystitis without hematuria 09/09/2023   Generalized weakness 09/08/2023   Lower urinary tract infectious disease 09/08/2023   Hemorrhoid prolapse 09/05/2023   Grade IV hemorrhoids 09/05/2023   Rectal bleeding 09/04/2023   Anemia 03/24/2023   Elevated INR 03/24/2023   Rectal prolapse 03/24/2023   Supratherapeutic INR 03/23/2023   Hypoalbuminemia due to protein-calorie malnutrition 03/23/2023   Essential hypertension 03/23/2023   Mixed hyperlipidemia 03/23/2023   History of CVA (cerebrovascular accident) - not on DOAC due to interaction with AEDs 03/23/2023   GI bleed 03/22/2023   Seizure (HCC) - cannot be on DOAC due to concurrent treatment with Depakote . 10/22/2022   Atrial fibrillation, chronic (HCC) - cannot be on DOAC due to concurrent treatment with Depakote . 08/25/2021   Acute kidney injury superimposed on chronic kidney disease (HCC) - Baseline scr 1.2-1.6 08/25/2021   PCP:  Gammon, Chrystal, NP Pharmacy:  [facility] - [place], [place] - [address] [place] [address] Phone: [phone] Fax: [phone]  [facility] [address] [place] [address] Phone: [phone] Fax: [phone]     Social Drivers of Health (SDOH) Social History: SDOH Screenings   Food Insecurity: No Food Insecurity (09/19/2023)  Housing: Low Risk (09/19/2023)  Transportation Needs: No Transportation Needs (09/19/2023)  Utilities:  Not At Risk (09/19/2023)  Social Connections: Socially Isolated (09/19/2023)  Tobacco Use: Low Risk (12/31/2023)   SDOH Interventions:     Readmission Risk Interventions    09/12/2023    7:53 AM  Readmission Risk Prevention Plan  Transportation Screening Complete  HRI or Home Care Consult Complete  Social Work Consult for Recovery Care Planning/Counseling Complete  Palliative Care Screening Not Applicable  Medication Review Oceanographer) Complete

## 2024-01-01 NOTE — Progress Notes (Signed)
 RN gave RT ABG results 7.55/26/1852/22.7 this is after patient RR was decreased by CCM earlier today. RT called MD and per MD decrease tidal volume to 6cc. Patient tolerating well at this time and RT will obtain another ABG.

## 2024-01-01 NOTE — TOC CAGE-AID Note (Signed)
 Transition of Care [facility]) - CAGE-AID Screening   Patient Details  Name: Nathan Shepherd MRN: [id number] Date of Birth: October 30, 1955  Transition of Care [facility]) CM/SW Contact:    Kimber Fritts E Daymen Hassebrock, [name] Phone Number: 01/01/2024, 1:44 PM   Clinical Narrative: Currently intubated.   CAGE-AID Screening: Substance Abuse Screening unable to be completed due to: : Patient unable to participate

## 2024-01-01 NOTE — Progress Notes (Addendum)
 STROKE TEAM PROGRESS NOTE    SIGNIFICANT HOSPITAL EVENTS  12/28: Presented to [facility] due to acute unresponsiveness.  CTA showed left M2 occlusion.  Transferred to [facility] for emergent thrombectomy.  INTERIM HISTORY/SUBJECTIVE  Pending MRI, echo.  Off Cleviprex , no sedation. Continues on broad-spectrum antibiotics  No family at bedside, RN at bedside.  Patient with minimal withdrawal on right, does not follow commands. Will start Aspirin  per tube.  MRI is pending  OBJECTIVE  CBC    Component Value Date/Time   WBC 18.0 (H) 01/01/2024 0455   RBC 4.71 01/01/2024 0455   HGB 12.7 (L) 01/01/2024 0455   HCT 38.2 (L) 01/01/2024 0455   PLT 169 01/01/2024 0455   MCV 81.1 01/01/2024 0455   MCH 27.0 01/01/2024 0455   MCHC 33.2 01/01/2024 0455   RDW 14.0 01/01/2024 0455   LYMPHSABS 0.3 (L) 12/31/2023 1434   MONOABS 2.6 (H) 12/31/2023 1434   EOSABS 0.0 12/31/2023 1434   BASOSABS 0.0 12/31/2023 1434    BMET    Component Value Date/Time   NA 141 01/01/2024 0455   K 3.7 01/01/2024 0455   CL 105 01/01/2024 0455   CO2 23 01/01/2024 0455   GLUCOSE 145 (H) 01/01/2024 0455   BUN 34 (H) 01/01/2024 0455   CREATININE 1.51 (H) 01/01/2024 0455   CALCIUM  9.4 01/01/2024 0455   GFRNONAA 51 (L) 01/01/2024 0455    IMAGING past 24 hours DG Chest Port 1 View Result Date: 12/31/2023 CLINICAL DATA:  Endotracheal and OG tube placement. EXAM: PORTABLE CHEST 1 VIEW, ABDOMEN PORTABLE. COMPARISON:  12/23/2023. FINDINGS: Chest: The heart size and mediastinal contours are stable. There is atherosclerotic calcification of the aorta. No consolidation, effusion, or pneumothorax is seen. An endotracheal tube terminates 3.1 cm above the carina. Abdomen: There is a nonobstructive bowel-gas pattern in the upper abdomen. An enteric tube terminates in the stomach and appears appropriate in position. IMPRESSION: 1. No active disease. 2. Support apparatus as described above. Electronically Signed   By: Leita Birmingham M.D.   On: 12/31/2023 19:33   DG Abd Portable 1V Result Date: 12/31/2023 CLINICAL DATA:  Endotracheal and OG tube placement. EXAM: PORTABLE CHEST 1 VIEW, ABDOMEN PORTABLE. COMPARISON:  12/23/2023. FINDINGS: Chest: The heart size and mediastinal contours are stable. There is atherosclerotic calcification of the aorta. No consolidation, effusion, or pneumothorax is seen. An endotracheal tube terminates 3.1 cm above the carina. Abdomen: There is a nonobstructive bowel-gas pattern in the upper abdomen. An enteric tube terminates in the stomach and appears appropriate in position. IMPRESSION: 1. No active disease. 2. Support apparatus as described above. Electronically Signed   By: Leita Birmingham M.D.   On: 12/31/2023 19:33   DG Chest Portable 1 View Result Date: 12/31/2023 CLINICAL DATA:  Post intubation and OG tube placement. EXAM: PORTABLE CHEST 1 VIEW COMPARISON:  12/31/2023. FINDINGS: Heart is enlarged and the mediastinal contour is within normal limits. There is atherosclerotic calcification of the aorta. The lungs are clear without effusions or infiltrates. An enteric tube terminates in the stomach and appears appropriate in position. The endotracheal tube terminates 5.5 cm above the carina. No acute osseous abnormality. IMPRESSION: 1. No active disease. 2. Support apparatus as described above. Electronically Signed   By: Leita Birmingham M.D.   On: 12/31/2023 16:39   DG CHEST PORT 1 VIEW Result Date: 12/31/2023 CLINICAL DATA:  Altered mental status, code stroke. EXAM: PORTABLE CHEST 1 VIEW COMPARISON:  09/20/2023. FINDINGS: The heart is enlarged and the mediastinal  contour is within normal limits. There is atherosclerotic calcification of the aorta. No consolidation, effusion, or pneumothorax is seen. No acute osseous abnormality. IMPRESSION: No active disease. Electronically Signed   By: Leita Birmingham M.D.   On: 12/31/2023 16:38   CT ANGIO HEAD NECK W WO CM (CODE STROKE) Result Date:  12/31/2023 EXAM: CTA Head and Neck with Intravenous Contrast. CLINICAL HISTORY: Neuro deficit, acute, stroke suspected. TECHNIQUE: Axial CTA images of the head and neck performed with intravenous contrast. MIP reconstructed images were created and reviewed. Axial computed tomography images of the head/brain performed without intravenous contrast. Note: Per PQRS, the description of internal carotid artery percent stenosis, including 0 percent or normal exam, is based on [employer] American Symptomatic Carotid Endarterectomy Trial (NASCET) criteria. Dose reduction technique was used including one or more of the following: automated exposure control, adjustment of mA and kV according to patient size, and/or iterative reconstruction. CONTRAST: With and without. 75 mL (iohexol  (OMNIPAQUE ) 350 MG/ML injection 75 mL IOHEXOL  350 MG/ML SOLN). COMPARISON: CT head and CTA head and neck dated 08/17/2016. FINDINGS: SINUSES AND MASTOIDS: Similar appearance of diffuse paranasal sinus mucosal thickening particularly in the frontal sinuses and right maxillary sinus. The mastoid air cells are clear. CTA NECK: COMMON CAROTID ARTERIES: Minimal atherosclerosis at the right carotid bifurcation without hemodynamically significant stenosis. Minimal atherosclerosis at the left carotid bifurcation without hemodynamically significant stenosis. No dissection or occlusion. INTERNAL CAROTID ARTERIES: There is tortuosity of the proximal and mid right cervical ICA. Mild tortuosity of the proximal left cervical ICA. No stenosis by NASCET criteria. No dissection or occlusion. VERTEBRAL ARTERIES: The right vertebral artery is patent from the origin to the intracranial segment. 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 There are a few areas of atherosclerosis and mild narrowing along the right V2 segment which are slightly increased from prior.  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 The left vertebral artery remains occluded to the level of the distal V4 segment at which point there is contrast filling of the vessel likely via retrograde flow. Atherosclerosis noted along the left V4 segment which is increased from prior. CTA HEAD: ANTERIOR CEREBRAL ARTERIES: The anterior cerebral arteries are patent bilaterally. No significant stenosis. No occlusion. No aneurysm. MIDDLE CEREBRAL ARTERIES: The right MCA is patent. There is mild narrowing of a few proximal right M2 branches. The left M1 segment is patent. There is occlusion of a proximal M2 branch of the left MCA (series 7, image 116). There is reconstitution of multiple M3 and M4 branches within the posterior aspect of the left MCA territory. No aneurysm. POSTERIOR CEREBRAL ARTERIES: Diminutive appearance of the left PCA without focal occlusion likely related to the prior infarct. The right PCA is patent. No significant stenosis. No occlusion. No aneurysm. BASILAR ARTERY: The basilar artery is patent. No significant stenosis. No occlusion. No aneurysm. OTHER: Mild atherosclerosis of the aortic arch. Atherosclerosis at the left subclavian artery origin and along the proximal right subclavian artery which are increased from prior without evidence of significant stenosis. SOFT TISSUES: No acute finding. No masses or lymphadenopathy. BONES: Partial fusion of the C5 and C6 vertebral bodies. No acute osseous abnormality. LUNGS: There are a few bilateral upper lobe pulmonary nodules measuring up to 3 mm. VASCULATURE: There is atherosclerosis of the bilateral carotid siphons. Mild stenosis of the  left cavernous ICA. IMPRESSION: 1. Occlusion of a proximal M2 branch of the left MCA. Reconstitution of multiple M3 and M4 branches in the posterior left MCA territory, likely via collaterals. 2. 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 3. 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 4. Additional mild atherosclerosis as above. 5. Bilateral upper lobe pulmonary nodules measuring up to 3 mm, with optional non-contrast chest CT at 12 months if the patient is high risk, per Fleischner Society Guidelines. 6. Impression #2 and #3 discussed with Dr. [name] at 2:33 PM on 12/31/23 and impression #1 discussed at 2:46 PM on 12/31/23. Electronically signed by: Donnice Mania MD 12/31/2023 02:52 PM EST RP Workstation: [id number]   CT HEAD CODE STROKE WO CONTRAST (LKW 0-4.5h, LVO 0-24h) Result Date: 12/31/2023 EXAM: CT HEAD WITHOUT 12/31/2023 02:13:57 PM TECHNIQUE: CT of the head was performed without the administration of intravenous contrast. Automated exposure control, iterative reconstruction, and/or weight based adjustment of the mA/kV was utilized to reduce the radiation dose to as low as reasonably achievable. COMPARISON: 02/22/2023 CLINICAL HISTORY: Neuro deficit, acute, stroke suspected FINDINGS: BRAIN AND VENTRICLES: No acute intracranial hemorrhage. No mass effect or midline shift. No extra-axial fluid collection. No evidence of acute infarct. No hydrocephalus. Remote left occipital infarct. Remote lacunar infarct in left thalamus. Patchy white matter hypodensities compatible with chronic microvascular ischemic disease. Similar appearance of small remote infarcts in the bifrontal periventricular white matter. [place] Stroke Program Early CT (ASPECT) score: Ganglionic (caudate, internal capsule, lentiform nucleus, insula, M1-M3): 7. Supraganglionic (M4-M6): 3. Total: 10. ORBITS: No acute abnormality. SINUSES AND MASTOIDS: Opacified frontal and right maxillary sinus. Polypoid paranasal sinus mucosal thickening. SOFT TISSUES AND SKULL: No acute skull fracture. No acute soft tissue abnormality. Calcific atherosclerosis. IMPRESSION: 1. No acute intracranial  abnormality. 2. ASPECTS score 10. 3. Remote infarcts and chronic microvascular ischemic disease. 4. Findings messaged to Dr. [name] at 2:21 PM on 12/31/23. Electronically signed by: Donnice Mania MD 12/31/2023 02:21 PM EST RP Workstation: [id number]    Vitals:   01/01/24 0832 01/01/24 0900 01/01/24 1000 01/01/24 1100  BP:  (!) 150/111 (!) 138/119 (!) 139/108  Pulse: (!) 103 100 91 94  Resp: 20 18 20 20   Temp:      TempSrc:      SpO2: 100% 100% 98% 100%  Weight:      Height:       PHYSICAL EXAM General: Acutely ill, no sedation CV: A-fib on monitor, controlled rate Respiratory: Intubated and mechanically ventilated  NEURO:  Mental Status/Language: obtunded, does not open eyes to voice or stimuli, does not follow commands.   Cranial Nerves:  II: PERRL. No blink to threat on left III, IV, VI: Left eye deviation V, VII: ETT in place. Weak Left Corneal Reflex.  IX, X: ETT in place. Cough/Gag present.  KP:Dynloizm shrug 5/5. XII: tongue is midline without fasciculations. Motor:  No spontaneous movement seen.  Sensation: RUE: slight withdraw only RLE: slight withdraw, weak with just the toes moving. LUE: mild withdraw LLE: minimal withdraw, but stronger than right.  Coordination: unable to perform Gait- deferred  Most Recent NIH 88   ASSESSMENT/PLAN  [name] is a 68 y.o. male with history of left PCA stroke, poststroke epilepsy (Depakote  and Keppra ), A-fib not on anticoagulation, GI bleed September 2025 with supratherapeutic INR who [name] 12/28 due to acute unresponsiveness.  EMS noted right gaze deviation,  no speech, not following commands, no withdrawal to stimuli.  Intubated for airway protection.  Patient does live in group home and it was difficult to determine his baseline.  EMS reported typically verbal and ambulatory with some cognitive impairment at baseline (on Aricept ).  CTA revealed left M2 occlusion.  Patient transferred  emergently to [facility] for thrombectomy.  NIH on Admission to [facility] 40.  Acute Ischemic Infarct:  left MCA territory s/p mechanical thrombectomy with 2B revascularization Etiology:  likely cardioembolic, known A-fib not on anticoagulation Code Stroke CT head  No acute intracranial abnormality. ASPECTS score 10. Remote infarcts and chronic microvascular ischemic disease. CTA head & neck  Occlusion of a proximal M2 branch of the left MCA. Reconstitution of multiple M3 and M4 branches in the posterior left MCA territory, likely via collaterals. 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 Additional mild atherosclerosis as above. MRI  PENDING 2D Echo PENDING LDL 46 HgbA1c 5.6 VTE prophylaxis - Heparin  SQ No antithrombotic prior to admission, now on ASA 81mg  per tube or rectal.  Therapy recommendations:  Pending Disposition:  pending  Acute Respiratory Failure s/p intubation CCM Attending Extubation precluded by mental status.   Hx of Seizures Acute Breakthrough Seizures Home Meds: Depakote  750mg  Q12H, Keppra  1000mg  BID Continued via IV access Pending continuous EEG hook-up Currently off sedation, no seizure activity seen  Depakote  level: 50 Keppra  level: pending  Afib, no OAC No OAC d/t history of GI Bleed Started on Aspirin  only Home medication Depakote , interaction noted   Hypertension Home meds:  Coreg  3.125mg  BID, torsemide  20mg  QAM Stable, Clevi off Blood Pressure Goal: SBP less than 160   Hyperlipidemia Home meds:  Crestor  20mg  LDL 46, goal < 70 High intensity statin not indicated due to LDL within goal on current treatment Continue statin when able to take PO  Dysphagia Patient has post-stroke dysphagia, SLP consulted    Diet   Diet NPO time specified   NG tube in place  Other Stroke Risk Factors Congestive heart  failure  Other Active Problems Leukocytosis, Fever Continues on broad-spectrum antibiotics Meningitis less likely Blood cultures pending Leukocytosis could be caused by seizures Monitor, continue to trend Hx of CKD, stage 3 Hx of Cognitive Impairment d/t Dementia Home Aricept  continued  Hospital day # 1   Pt seen by Neuro NP/APP with MD. Note/plan to be edited by MD as needed.    Rocky JAYSON Likes, DNP [facility] Please use AMION for contact information & EPIC for messaging.  I have personally obtained history,examined this patient, reviewed notes, independently viewed imaging studies, participated in medical decision making and plan of care.ROS completed by me personally and pertinent positives fully documented  I have made any additions or clarifications directly to the above note. Agree with note above.  Patient with prior history of stroke and seizures and atrial fibrillation presented with sudden onset of mental status changes with exam suggesting significant aphasia with left M2 occlusion and underwent successful mechanical thrombectomy with neurological exam yet remains poor with patient on ventilator support.  Continue close neurological observation and strict blood pressure control as per post thrombectomy protocol.  Check MRI scan of the brain.  Wean off ventilatory support as per critical care team.  May need to discuss goals of care with family.  Discussed with Dr. Annella critical care medicine. This patient is critically ill and at  significant risk of neurological worsening, death and care requires constant monitoring of vital signs, hemodynamics,respiratory and cardiac monitoring, extensive review of multiple databases, frequent neurological assessment, discussion with family, other specialists and medical decision making of high complexity.I have made any additions or clarifications directly to the above note.This critical care time does not reflect procedure time,  or teaching time or supervisory time of PA/NP/Med Resident etc but could involve care discussion time.  I spent 30 minutes of neurocritical care time  in the care of  this patient.     Eather Popp, MD Medical Director [facility] Pager: [phone] 01/01/2024 4:51 PM   To contact Stroke Continuity provider, please refer to [email or web address]. After hours, contact General Neurology

## 2024-01-01 NOTE — Progress Notes (Addendum)
 "  NAME:  Nathan Shepherd, MRN:  [id number], DOB:  1955/01/31, LOS: 1 ADMISSION DATE:  12/31/2023, CONSULTATION DATE:  01/01/2024 REFERRING MD:  [name], CHIEF COMPLAINT:  cva   History of Present Illness:  Nathan Shepherd is a 68 y.o. 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  At 1 PM EMS was called because patient had noted right gaze deviation and was not answering questions or following commands.  He was taken to [facility] where he was out of the window for thrombolytic, he was treated with Vimpat  and Ativan  and had minimal subsequent seizure activity.  Him imaging reveals left M2 occlusion for which he was taken for IR thrombectomy at [facility].  2B TICI score after 3 passes without complication.  The patient was transferred to the ICU intubated.  Also had noted fevers earlier today though not on on arrival to the ICU.  Blood cultures and broad-spectrum antibiotics initiated.  Pertinent  Medical History   has a past medical history of CHF (congestive heart failure) (HCC), CKD (chronic kidney disease) stage 3, GFR 30-59 ml/min (HCC), Edema, Hyperlipemia, Hypertension, and Seizures (HCC).   Significant Hospital Events: Including procedures, antibiotic start and stop dates in addition to other pertinent events   12/28 left M2 occlusion and possible seizure activity, thrombectomy and transferred to the ICU intubated  Interim History / Subjective:  Off clevi not on NE  cEEG not started overnight 2/2 staffing  Off all sedation   Objective    Blood pressure (!) 139/108, pulse 94, temperature 100 F (37.8 C), temperature source Axillary, resp. rate 20, height 6' 4 (1.93 m), weight 89.8 kg, SpO2 100%.    Vent Mode: PRVC FiO2 (%):  [40 %-60 %] 40 % Set Rate:  [20 bmp] 20 bmp Vt Set:  [650 mL] 650 mL PEEP:  [5 cmH20] 5  cmH20 Plateau Pressure:  [17 cmH20-20 cmH20] 20 cmH20   Intake/Output Summary (Last 24 hours) at 01/01/2024 1127 Last data filed at 01/01/2024 1100 Gross per 24 hour  Intake 1980.53 ml  Output 1250 ml  Net 730.53 ml   Filed Weights   12/31/23 2000  Weight: 89.8 kg    General: Critically ill F appears older than stated age  HEENT: NCAT. ETT secure EEG in place. Injected sclera  Neuro: Pupils are 4mm reactive. + spontaneous resp effort. Grimaces to ETT suctioning. Spontaneously moved LUE antigravity.  CV: afib. Cap refill brisk  PULM: Mechanically ventilated, symmetrical chest expansion  GI: thin soft  Extremities: no acute joint deformity, no edema    Resolved problem list   Assessment and Plan   Acute L MCA CVA, L M2 occlusion  Hx sz, with concern for SE  Baseline cognitive deficit, possible dementia   -s/p thrombectomy, TICI 2b  P -SBP goal < 160 -- dc clevi and NE, is not on either  -neuro imaging per stroke service -- sounds like MRI 12/29 -cEEG  -AED per neuro -- currentl on 1g keppra  BID and 750 VPA q12  -off sedation  -ASA statin -ECHO  -neuroprotective measures  -resume PTA aricept    Endotracheally intubated P -cont MV support. VAP, pulm hygiene -wean vent as able; progress toward extubation is limited by mentation + by poor resp effort ( low volumes low rate)  -dropping RR to 16 based on overnight  ABG  -PRN Abg, CXR  Afib not on [facility]  -prior GIB, also note prior rationale of VPA interaction  HTN  P -holding home meds   AKI  Hypomagnesemia Hypophosphatemia  P -getting K phos  and Mag 12/29  -follow renal indices and UOP   Fever, leukocytosis  -unclear etiology, cannot rule out infectious process yet -sz could account for this  -dont think meningitis but will keep in mind  P -cont broad abx including MRSA coverage  -PRN APAP  -bcx pending  -dc dose of decadron  from 12/28   Lactic acidosis  P -follow PRN -- will recehck 1400 12/29      Labs   CBC: Recent Labs  Lab 12/31/23 1412 12/31/23 1434 12/31/23 2034 01/01/24 0455  WBC  --  15.5*  --  18.0*  NEUTROABS  --  12.5*  --   --   HGB 16.7 13.5 12.2* 12.7*  HCT 49.0 42.6 36.0* 38.2*  MCV  --  84.0  --  81.1  PLT  --  189  --  169    Basic Metabolic Panel: Recent Labs  Lab 12/31/23 1412 12/31/23 1434 12/31/23 2034 01/01/24 0455  NA 144 143 142 141  K 3.3* 3.2* 3.6 3.7  CL 100 101  --  105  CO2  --  26  --  23  GLUCOSE 117* 115*  --  145*  BUN 31* 28*  --  34*  CREATININE 1.40* 1.11  --  1.51*  CALCIUM   --  9.7  --  9.4  MG  --   --   --  1.5*  PHOS  --   --   --  2.0*   GFR: Estimated Creatinine Clearance: 59.1 mL/min (A) (by C-G formula based on SCr of 1.51 mg/dL (H)). Recent Labs  Lab 12/31/23 1434 12/31/23 1849 12/31/23 2057 01/01/24 0455  WBC 15.5*  --   --  18.0*  LATICACIDVEN  --  3.5* 2.3* 3.1*    Liver Function Tests: Recent Labs  Lab 12/31/23 1434  AST 24  ALT 6  ALKPHOS 71  BILITOT 0.7  PROT 7.5  ALBUMIN 3.5   No results for input(s): LIPASE, AMYLASE in the last 168 hours. Recent Labs  Lab 12/31/23 1434  AMMONIA 27    ABG    Component Value Date/Time   PHART 7.604 (HH) 12/31/2023 2034   PCO2ART 23.1 (L) 12/31/2023 2034   PO2ART 191 (H) 12/31/2023 2034   HCO3 23.0 12/31/2023 2034   TCO2 24 12/31/2023 2034   ACIDBASEDEF 1.0 08/21/2021 1821   O2SAT 100 12/31/2023 2034     Coagulation Profile: Recent Labs  Lab 12/31/23 1434  INR 1.2    Cardiac Enzymes: Recent Labs  Lab 12/31/23 1434  CKTOTAL 43*    HbA1C: Hgb A1c MFr Bld  Date/Time Value Ref Range Status  12/31/2023 08:57 PM 5.6 4.8 - 5.6 % Final    Comment:    (NOTE) Diagnosis of Diabetes The following HbA1c ranges recommended by the American Diabetes Association (ADA) may be used as an aid in the diagnosis of diabetes mellitus.  Hemoglobin             Suggested A1C NGSP%              Diagnosis  <5.7                   Non  Diabetic  5.7-6.4  Pre-Diabetic  >6.4                   Diabetic  <7.0                   Glycemic control for                       adults with diabetes.    05/12/2022 03:09 PM 6.1 (H) 4.8 - 5.6 % Final    Comment:    (NOTE) Pre diabetes:          5.7%-6.4%  Diabetes:              >6.4%  Glycemic control for   <7.0% adults with diabetes     CBG: Recent Labs  Lab 12/31/23 1402 12/31/23 2011 01/01/24 0000 01/01/24 0359 01/01/24 0813  GLUCAP 126* 105* 144* 144* 112*   CRITICAL CARE Performed by: Ronnald FORBES Gave  Total critical care time: 40 minutes  Critical care time was exclusive of separately billable procedures and treating other patients. Critical care was necessary to treat or prevent imminent or life-threatening deterioration.  Critical care was time spent personally by me on the following activities: development of treatment plan with patient and/or surrogate as well as nursing, discussions with consultants, evaluation of patient's response to treatment, examination of patient, obtaining history from patient or surrogate, ordering and performing treatments and interventions, ordering and review of laboratory studies, ordering and review of radiographic studies, pulse oximetry and re-evaluation of patient's condition.  Ronnald Gave MSN, AGACNP-BC [facility] Amion for pager  01/01/2024, 11:27 AM     "

## 2024-01-02 ENCOUNTER — Inpatient Hospital Stay (HOSPITAL_COMMUNITY)

## 2024-01-02 DIAGNOSIS — D72829 Elevated white blood cell count, unspecified: Secondary | ICD-10-CM | POA: Diagnosis not present

## 2024-01-02 DIAGNOSIS — I4891 Unspecified atrial fibrillation: Secondary | ICD-10-CM | POA: Diagnosis not present

## 2024-01-02 DIAGNOSIS — E785 Hyperlipidemia, unspecified: Secondary | ICD-10-CM | POA: Diagnosis not present

## 2024-01-02 DIAGNOSIS — I7090 Unspecified atherosclerosis: Secondary | ICD-10-CM | POA: Diagnosis not present

## 2024-01-02 DIAGNOSIS — J9600 Acute respiratory failure, unspecified whether with hypoxia or hypercapnia: Secondary | ICD-10-CM

## 2024-01-02 DIAGNOSIS — I509 Heart failure, unspecified: Secondary | ICD-10-CM | POA: Diagnosis not present

## 2024-01-02 DIAGNOSIS — I110 Hypertensive heart disease with heart failure: Secondary | ICD-10-CM | POA: Diagnosis not present

## 2024-01-02 DIAGNOSIS — I739 Peripheral vascular disease, unspecified: Secondary | ICD-10-CM

## 2024-01-02 DIAGNOSIS — I63412 Cerebral infarction due to embolism of left middle cerebral artery: Secondary | ICD-10-CM | POA: Diagnosis not present

## 2024-01-02 DIAGNOSIS — I69391 Dysphagia following cerebral infarction: Secondary | ICD-10-CM | POA: Diagnosis not present

## 2024-01-02 DIAGNOSIS — N179 Acute kidney failure, unspecified: Secondary | ICD-10-CM | POA: Diagnosis not present

## 2024-01-02 DIAGNOSIS — I69398 Other sequelae of cerebral infarction: Secondary | ICD-10-CM

## 2024-01-02 DIAGNOSIS — G40909 Epilepsy, unspecified, not intractable, without status epilepticus: Secondary | ICD-10-CM | POA: Diagnosis not present

## 2024-01-02 DIAGNOSIS — R569 Unspecified convulsions: Secondary | ICD-10-CM | POA: Diagnosis not present

## 2024-01-02 DIAGNOSIS — G40901 Epilepsy, unspecified, not intractable, with status epilepticus: Secondary | ICD-10-CM | POA: Diagnosis not present

## 2024-01-02 DIAGNOSIS — I63512 Cerebral infarction due to unspecified occlusion or stenosis of left middle cerebral artery: Secondary | ICD-10-CM | POA: Diagnosis not present

## 2024-01-02 LAB — RENAL FUNCTION PANEL
Albumin: 2.6 g/dL — ABNORMAL LOW (ref 3.5–5.0)
Anion gap: 16 — ABNORMAL HIGH (ref 5–15)
BUN: 53 mg/dL — ABNORMAL HIGH (ref 8–23)
CO2: 20 mmol/L — ABNORMAL LOW (ref 22–32)
Calcium: 9.6 mg/dL (ref 8.9–10.3)
Chloride: 106 mmol/L (ref 98–111)
Creatinine, Ser: 3.32 mg/dL — ABNORMAL HIGH (ref 0.61–1.24)
GFR, Estimated: 20 mL/min — ABNORMAL LOW
Glucose, Bld: 117 mg/dL — ABNORMAL HIGH (ref 70–99)
Phosphorus: 6.2 mg/dL — ABNORMAL HIGH (ref 2.5–4.6)
Potassium: 4.6 mmol/L (ref 3.5–5.1)
Sodium: 142 mmol/L (ref 135–145)

## 2024-01-02 LAB — CBC
HCT: 35.8 % — ABNORMAL LOW (ref 39.0–52.0)
Hemoglobin: 11.5 g/dL — ABNORMAL LOW (ref 13.0–17.0)
MCH: 26.6 pg (ref 26.0–34.0)
MCHC: 32.1 g/dL (ref 30.0–36.0)
MCV: 82.7 fL (ref 80.0–100.0)
Platelets: 141 K/uL — ABNORMAL LOW (ref 150–400)
RBC: 4.33 MIL/uL (ref 4.22–5.81)
RDW: 14.4 % (ref 11.5–15.5)
WBC: 19.6 K/uL — ABNORMAL HIGH (ref 4.0–10.5)
nRBC: 0 % (ref 0.0–0.2)

## 2024-01-02 LAB — GLUCOSE, CAPILLARY
Glucose-Capillary: 97 mg/dL (ref 70–99)
Glucose-Capillary: 97 mg/dL (ref 70–99)
Glucose-Capillary: 87 mg/dL (ref 70–99)
Glucose-Capillary: 84 mg/dL (ref 70–99)
Glucose-Capillary: 86 mg/dL (ref 70–99)
Glucose-Capillary: 99 mg/dL (ref 70–99)

## 2024-01-02 LAB — SODIUM, URINE, RANDOM: Sodium, Ur: 84 mmol/L

## 2024-01-02 LAB — CREATININE, URINE, RANDOM: Creatinine, Urine: 69 mg/dL

## 2024-01-02 LAB — LEVETIRACETAM LEVEL: Levetiracetam Lvl: 33.8 ug/mL (ref 10.0–40.0)

## 2024-01-02 LAB — PROCALCITONIN: Procalcitonin: 100 ng/mL

## 2024-01-02 LAB — MAGNESIUM: Magnesium: 2.2 mg/dL (ref 1.7–2.4)

## 2024-01-02 MED ORDER — LACTATED RINGERS IV SOLN: INTRAVENOUS | Status: DC

## 2024-01-02 MED ORDER — LINEZOLID 600 MG PO TABS
600.0000 mg | ORAL_TABLET | Freq: Two times a day (BID) | ORAL | Status: DC
  Administered 2024-01-02 – 2024-01-06 (×7): 600 mg
  Filled 2024-01-02 (×12): qty 1

## 2024-01-02 MED ORDER — POLYETHYLENE GLYCOL 3350 17 G PO PACK
17.0000 g | PACK | Freq: Two times a day (BID) | ORAL | Status: DC
  Administered 2024-01-02 (×2): 17 g
  Filled 2024-01-02 (×2): qty 1

## 2024-01-02 MED ORDER — SENNOSIDES-DOCUSATE SODIUM 8.6-50 MG PO TABS
1.0000 | ORAL_TABLET | Freq: Every day | ORAL | Status: DC
  Administered 2024-01-02: 1
  Filled 2024-01-02: qty 1

## 2024-01-02 MED ORDER — LEVETIRACETAM (KEPPRA) 500 MG/5 ML ADULT IV PUSH
500.0000 mg | Freq: Two times a day (BID) | INTRAVENOUS | Status: DC
  Administered 2024-01-02 – 2024-01-07 (×10): 500 mg via INTRAVENOUS
  Filled 2024-01-02 (×10): qty 5

## 2024-01-02 MED ORDER — SODIUM BICARBONATE 650 MG PO TABS
650.0000 mg | ORAL_TABLET | Freq: Three times a day (TID) | ORAL | Status: DC
  Administered 2024-01-02 – 2024-01-04 (×9): 650 mg
  Filled 2024-01-02 (×9): qty 1

## 2024-01-02 MED ORDER — SODIUM CHLORIDE 0.9 % IV BOLUS
1000.0000 mL | Freq: Once | INTRAVENOUS | Status: AC | PRN
  Administered 2024-01-03: 1000 mL via INTRAVENOUS

## 2024-01-02 NOTE — Progress Notes (Signed)
 Spoke and updated pts legal guardian Nathan Shepherd earlier. Inquired if his mother has called.  We did not have her name or number in demographics, stated she would call and let her know pt is hospitalized.   Mother, Nathan Shepherd [phone] since called and asked for update, was not aware he was hospitalized.  Called and spoke with her.  She lives in [place] .  Is making travel plans to come in next several days.  Informed we could set up virtual visit but declined at this time, asking to just hold phone up to his ear to talk to him, will pass along to bedside when they are available.      Nathan Pesa, NP [facility] 01/02/2024, 5:12 PM  See Amion for pager If no response to pager , please call [phone] until 7pm After 7:00 pm call Elink  [phone]

## 2024-01-02 NOTE — Procedures (Addendum)
 Patient Name: Nathan Shepherd  MRN: [id number]  Epilepsy Attending: Arlin MALVA Krebs  Referring Physician/Provider: Judithe Rocky BROCKS, NP  Duration: 01/01/2024 0948 to 01/02/2024 1100  Patient history: 68yo M with acute L MCA CVA, L M2 occlusion  Hx sz, with concern for status. EEG to evaluate for seizure  Level of alertness:  comatose/ lethargic   AEDs during EEG study: LEV, VPA  Technical aspects: This EEG study was done with scalp electrodes positioned according to the 10-20 International system of electrode placement. Electrical activity was reviewed with band pass filter of 1-70Hz , sensitivity of 7 uV/mm, display speed of 11mm/sec with a 60Hz  notched filter applied as appropriate. EEG data were recorded continuously and digitally stored.  Video monitoring was available and reviewed as appropriate.  Description: EEG showed continuous generalized and maximal left fronto-temporal region 3 to 6 Hz theta-delta slowing admixed with 12-15hz  beta activity. Hyperventilation and photic stimulation were not performed.    EEG was disconnected between 11/01/2023 2103 to 2220   ABNORMALITY - Continuous slow, generalized and maximal left fronto-temporal region   IMPRESSION: This study is uggestive of cortical dysfunction arising from fronto-temporal region likely secondary to underlying structural abnormality. Additionally there is generalized cerebral dysfunction (encephalopathy). No seizures or epileptiform discharges were seen throughout the recording.  Nathan Shepherd

## 2024-01-02 NOTE — Progress Notes (Signed)
 LTM EEG D/C'd. No noted skin break down. [facility] notified.

## 2024-01-02 NOTE — Progress Notes (Signed)
 CCM updated on patient low UOP. Foley flushed and urine return noted.  No new orders given,

## 2024-01-02 NOTE — Progress Notes (Signed)
 STROKE TEAM PROGRESS NOTE    SIGNIFICANT HOSPITAL EVENTS  12/28: Presented to [facility] due to acute unresponsiveness.  CTA showed left M2 occlusion.  Transferred to [facility] for emergent thrombectomy.  INTERIM HISTORY/SUBJECTIVE  Pending MRI, echo.  Off Cleviprex , no sedation. Continues on broad-spectrum antibiotics  No family at bedside, RN at bedside.  Patient with minimal withdrawal on right, does not follow commands. Will start Aspirin  per tube.  MRI is pending  OBJECTIVE Patient is now off sedation.  He remains intubated for respiratory failure.  Remains unresponsive but only has some semipurposeful movements on the left and minimal movement in the right leg. Long-term EEG shows left frontotemporal theta-delta range slowing and mild encephalopathy but no seizure activity PCM plan ventilator weaning and fluid challenge due to oliguria and worsening kidney function MRI scan of the brain yesterday shows left parietal, insular and temporal and coronary infarcts.  No hemorrhage CBC    Component Value Date/Time   WBC 19.6 (H) 01/02/2024 0834   RBC 4.33 01/02/2024 0834   HGB 11.5 (L) 01/02/2024 0834   HCT 35.8 (L) 01/02/2024 0834   PLT 141 (L) 01/02/2024 0834   MCV 82.7 01/02/2024 0834   MCH 26.6 01/02/2024 0834   MCHC 32.1 01/02/2024 0834   RDW 14.4 01/02/2024 0834   LYMPHSABS 0.3 (L) 12/31/2023 1434   MONOABS 2.6 (H) 12/31/2023 1434   EOSABS 0.0 12/31/2023 1434   BASOSABS 0.0 12/31/2023 1434    BMET    Component Value Date/Time   NA 142 01/02/2024 0420   K 4.6 01/02/2024 0420   CL 106 01/02/2024 0420   CO2 20 (L) 01/02/2024 0420   GLUCOSE 117 (H) 01/02/2024 0420   BUN 53 (H) 01/02/2024 0420   CREATININE 3.32 (H) 01/02/2024 0420   CALCIUM  9.6 01/02/2024 0420   GFRNONAA 20 (L) 01/02/2024 0420    IMAGING past 24 hours US  RENAL Result Date: 01/02/2024 CLINICAL DATA:  Acute kidney injury EXAM: RENAL / URINARY TRACT ULTRASOUND COMPLETE COMPARISON:  None  Available. FINDINGS: Right Kidney: Renal measurements: 9.2 x 4.7 x 4.3 cm = volume: 98 mL. 5.7 cm exophytic simple cyst is noted. Echogenicity within normal limits. No mass or hydronephrosis visualized. Left Kidney: Renal measurements: 7.9 x 4.9 x 4.6 cm = volume: 94 mL. Echogenicity within normal limits. No mass or hydronephrosis visualized. Bladder: Decompressed secondary to Foley catheter. Other: None. IMPRESSION: No significant renal abnormality is noted. Electronically Signed   By: Lynwood Landy Raddle M.D.   On: 01/02/2024 09:32   Overnight EEG with video Result Date: 01/02/2024 Shelton Arlin KIDD, MD     01/02/2024  9:31 AM Patient Name: Nathan Shepherd MRN: [id number] Epilepsy Attending: Arlin KIDD Shelton Referring Physician/Provider: Judithe Rocky BROCKS, NP Duration: 01/01/2024 0948 to 01/02/2024 0930 Patient history: 68yo M with acute L MCA CVA, L M2 occlusion Hx sz, with concern for status. EEG to evaluate for seizure Level of alertness:  comatose/ lethargic AEDs during EEG study: LEV, VPA Technical aspects: This EEG study was done with scalp electrodes positioned according to the 10-20 International system of electrode placement. Electrical activity was reviewed with band pass filter of 1-70Hz , sensitivity of 7 uV/mm, display speed of 6mm/sec with a 60Hz  notched filter applied as appropriate. EEG data were recorded continuously and digitally stored.  Video monitoring was available and reviewed as appropriate. Description: EEG showed continuous generalized and maximal left fronto-temporal region 3 to 6 Hz theta-delta slowing admixed with 12-15hz  beta activity. Hyperventilation and photic stimulation  were not performed.  EEG was disconnected between 11/01/2023 2103 to 2220 ABNORMALITY - Continuous slow, generalized and maximal left fronto-temporal region IMPRESSION: This study is uggestive of cortical dysfunction arising from fronto-temporal region likely secondary to underlying structural abnormality.  Additionally there is generalized cerebral dysfunction (encephalopathy). No seizures or epileptiform discharges were seen throughout the recording. Arlin MALVA Krebs   MR BRAIN WO CONTRAST Result Date: 01/02/2024 EXAM: MRI BRAIN WITHOUT CONTRAST 01/01/2024 10:03:37 PM TECHNIQUE: Multiplanar multisequence MRI of the head/brain was performed without the administration of intravenous contrast. COMPARISON: MR Head Without Contrast 09/10/2023. CLINICAL HISTORY: Stroke, follow up. FINDINGS: BRAIN AND VENTRICLES: There are new areas of restricted diffusion involving the left parietal cortex, left insular region, left temporal periventricular white matter and left corona radiata. Chronic encephalomalacia changes are again demonstrated within the left medial occipital lobe. There is moderately advanced periventricular and deep cerebral white matter disease. No intracranial hemorrhage. No mass. No midline shift. No hydrocephalus. The sella is unremarkable. Normal flow voids. ORBITS: No acute abnormality. SINUSES AND MASTOIDS: There is moderate mucosal disease within the paranasal sinuses. BONES AND SOFT TISSUES: Normal marrow signal. No acute soft tissue abnormality. IMPRESSION: 1. New areas of restricted diffusion involving the left parietal cortex, left insular region, left temporal periventricular white matter, and left corona radiata, consistent with acute/subacute infarcts. 2. Chronic encephalomalacia changes within the left medial occipital lobe. 3. Moderately advanced periventricular and deep cerebral white matter disease. 4. Moderate mucosal disease within the paranasal sinuses. Electronically signed by: Evalene Coho MD 01/02/2024 05:23 AM EST RP Workstation: [id number]    Vitals:   01/02/24 0800 01/02/24 0900 01/02/24 1000 01/02/24 1108  BP: 105/86 (!) 120/107 105/78   Pulse: 86 (!) 103 94 92  Resp: (!) 27 (!) 25 (!) 24 (!) 25  Temp: 99.3 F (37.4 C) 99.3 F (37.4 C) 99.3 F (37.4 C) 99.3 F (37.4  C)  TempSrc:      SpO2: 100% 100% 100% 100%  Weight:      Height:       PHYSICAL EXAM General: Acutely ill, no sedation CV: A-fib on monitor, controlled rate Respiratory: Intubated and mechanically ventilated  NEURO:  Mental Status/Language: obtunded, does not open eyes to voice or stimuli, does not follow commands.   Cranial Nerves:  II: PERRL. No blink to threat on left III, IV, VI: Left eye deviation V, VII: ETT in place. Weak Left Corneal Reflex.  IX, X: ETT in place. Cough/Gag present.  KP:Dynloizm shrug 5/5. XII: tongue is midline without fasciculations. Motor:  No spontaneous movement seen.  Sensation: RUE: slight withdraw only RLE: slight withdraw, weak with just the toes moving. LUE: mild withdraw LLE: minimal withdraw, but stronger than right.  Coordination: unable to perform Gait- deferred      ASSESSMENT/PLAN  [name] is a 68 y.o. male with history of left PCA stroke, poststroke epilepsy (Depakote  and Keppra ), A-fib not on anticoagulation, GI bleed September 2025 with supratherapeutic INR who [name] 12/28 due to acute unresponsiveness.  EMS noted right gaze deviation, no speech, not following commands, no withdrawal to stimuli.  Intubated for airway protection.  Patient does live in group home and it was difficult to determine his baseline.  EMS reported typically verbal and ambulatory with some cognitive impairment at baseline (on Aricept ).  CTA revealed left M2 occlusion.  Patient transferred emergently to [facility] for thrombectomy.  NIH on Admission to [facility] 40.  Acute Ischemic Infarct:  left  MCA territory s/p mechanical thrombectomy with 2B revascularization Etiology:  likely cardioembolic, known A-fib not on anticoagulation Code Stroke CT head  No acute intracranial abnormality. ASPECTS score 10. Remote infarcts and chronic microvascular ischemic disease. CTA head & neck  Occlusion of a proximal M2 branch of  the left MCA. Reconstitution of multiple M3 and M4 branches in the posterior left MCA territory, likely via collaterals. 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 Additional mild atherosclerosis as above. MRI  PENDING 2D Echo PENDING LDL 46 HgbA1c 5.6 VTE prophylaxis - Heparin  SQ No antithrombotic prior to admission, now on ASA 81mg  per tube or rectal.  Therapy recommendations:  Pending Disposition:  pending  Acute Respiratory Failure s/p intubation CCM Attending Extubation precluded by mental status.   Hx of Seizures Acute Breakthrough Seizures Home Meds: Depakote  750mg  Q12H, Keppra  1000mg  BID Continued via IV access Pending continuous EEG hook-up Currently off sedation, no seizure activity seen  Depakote  level: 50 Keppra  level: pending  Afib, no OAC No OAC d/t history of GI Bleed Started on Aspirin  only Home medication Depakote , interaction noted   Hypertension Home meds:  Coreg  3.125mg  BID, torsemide  20mg  QAM Stable, Clevi off Blood Pressure Goal: SBP less than 160   Hyperlipidemia Home meds:  Crestor  20mg  LDL 46, goal < 70 High intensity statin not indicated due to LDL within goal on current treatment Continue statin when able to take PO  Dysphagia Patient has post-stroke dysphagia, SLP consulted    Diet   Diet NPO time specified   NG tube in place  Other Stroke Risk Factors Congestive heart failure  Other Active Problems Leukocytosis, Fever Continues on broad-spectrum antibiotics Meningitis less likely Blood cultures pending Leukocytosis could be caused by seizures Monitor, continue to trend Hx of CKD, stage 3 Hx of Cognitive Impairment d/t Dementia Home Aricept  continued  Hospital day # 2   Pt seen by Neuro NP/APP with MD. Note/plan to be edited by MD as needed.    Rocky JAYSON Likes, DNP [facility] Please use AMION for contact information & EPIC for messaging.  I have personally obtained history,examined this patient, reviewed notes, independently viewed imaging studies, participated in medical decision making and plan of care.ROS completed by me personally and pertinent positives fully documented  I have made any additions or clarifications directly to the above note. Agree with note above.  Patient with prior history of stroke and seizures and atrial fibrillation presented with sudden onset of mental status changes with exam suggesting significant aphasia with left M2 occlusion and underwent successful mechanical thrombectomy with neurological exam yet remains poor with patient on ventilator support.  Continue close neurological observation and strict blood pressure control as per post thrombectomy protocol.  .  Fluid challenge for oliguria and worsening renal function per CCM.  Discontinue long-term EEG monitoring.  Wean off ventilatory support as per critical care team.  May need to discuss goals of care with family.  Discussed with Dr. Annella critical care medicine.  This patient is critically ill and at significant risk of neurological worsening, death and care requires constant monitoring of vital signs, hemodynamics,respiratory and cardiac monitoring, extensive review of multiple databases, frequent neurological assessment, discussion with family, other specialists and medical decision making of high complexity.I have made any additions or clarifications directly to the above note.This critical care time does not reflect procedure time, or teaching time or  supervisory time of PA/NP/Med Resident etc but could involve care discussion time.  I spent 30 minutes of neurocritical care time  in the care of  this patient.        Eather Popp, MD Medical Director [facility] St[facility] Pager: [phone] 01/02/2024 12:57 PM   To contact Stroke Continuity provider,  please refer to [email or web address]. After hours, contact General Neurology

## 2024-01-02 NOTE — Progress Notes (Signed)
 "  NAME:  Nathan Shepherd, MRN:  [id number], DOB:  August 19, 1955, LOS: 2 ADMISSION DATE:  12/31/2023, CONSULTATION DATE:  01/02/2024 REFERRING MD:  [name], CHIEF COMPLAINT:  cva   History of Present Illness:  Nathan Shepherd is a 68 y.o. 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  At 1 PM EMS was called because patient had noted right gaze deviation and was not answering questions or following commands.  He was taken to [facility] where he was out of the window for thrombolytic, he was treated with Vimpat  and Ativan  and had minimal subsequent seizure activity.  Him imaging reveals left M2 occlusion for which he was taken for IR thrombectomy at [facility].  2B TICI score after 3 passes without complication.  The patient was transferred to the ICU intubated.  Also had noted fevers earlier today though not on on arrival to the ICU.  Blood cultures and broad-spectrum antibiotics initiated.  Pertinent  Medical History   has a past medical history of CHF (congestive heart failure) (HCC), CKD (chronic kidney disease) stage 3, GFR 30-59 ml/min (HCC), Edema, Hyperlipemia, Hypertension, and Seizures (HCC).  Significant Hospital Events: Including procedures, antibiotic start and stop dates in addition to other pertinent events   12/28 left M2 occlusion and possible seizure activity, thrombectomy and transferred to the ICU intubated  Interim History / Subjective:  Off clevi not on NE  cEEG not started overnight 2/2 staffing  Off all sedation   Objective    Blood pressure (!) 121/93, pulse (!) 103, temperature 99.3 F (37.4 C), resp. rate (!) 30, height 6' 4 (1.93 m), weight 90.1 kg, SpO2 100%.    Vent Mode: CPAP;PSV FiO2 (%):  [40 %-50 %] 40 % Set Rate:  [14 bmp-20 bmp] 14 bmp Vt Set:  [520 mL-650 mL] 520 mL PEEP:  [5 cmH20] 5  cmH20 Pressure Support:  [15 cmH20] 15 cmH20 Plateau Pressure:  [11 cmH20-20 cmH20] 17 cmH20   Intake/Output Summary (Last 24 hours) at 01/02/2024 0807 Last data filed at 01/02/2024 0700 Gross per 24 hour  Intake 2896.39 ml  Output 255 ml  Net 2641.39 ml   Filed Weights   12/31/23 2000 01/02/24 0500  Weight: 89.8 kg 90.1 kg   No sedation General:  critically ill older male lying in bed in NAD HEENT: MM pink/dry, ETT/ OGT, pupils 3/r, +corneals, EEG in place Neuro: unresponsive to noxious stimuli, does flicker toes with palpation of abd CV: irir, 90-110s PULM:  currently on SBT 15/5 with 350-450 volumes, deep cough GI: taut, hypobs, foley - cyu Extremities: warm/dry, no tibial edema  Skin: no rashes   Tmax 99.3 Labs> K 4.6, bicarb 20, BUN/ sCr 34/ 1.51> 53/ 3.32, phos 6.2, mag 2.2, albumin 2.6  MR brain wo contrast 12/29>  1. New areas of restricted diffusion involving the left parietal cortex, left insular region, left temporal periventricular white matter, and left corona radiata, consistent with acute/subacute infarcts. 2. Chronic encephalomalacia changes within the left medial occipital lobe. 3. Moderately advanced periventricular and deep cerebral white matter disease. 4. Moderate mucosal disease within the paranasal sinuses.  Patient Lines/Drains/Airways Status     Active Line/Drains/Airways     Name Placement date Placement time Site Days   Arterial Line 12/31/23 Left Radial 12/31/23  1700  Radial  2   Peripheral IV  12/31/23 20 G Anterior;Distal;Right;Upper Arm 12/31/23  1435  Arm  2   Peripheral IV 12/31/23 20 G Left;Posterior Forearm 12/31/23  1542  Forearm  2   Peripheral IV 12/31/23 22 G Posterior;Right Hand 12/31/23  1548  Hand  2   NG/OG Vented/Dual Lumen 16 Fr. Oral Marking at nare/corner of mouth 65 cm 12/31/23  1520  Oral  2   Urethral Catheter tori leonard rn Temperature probe 16 Fr. 12/31/23  1530  Temperature probe  2   Airway 8 mm 12/31/23  1518  -- 2             Resolved problem list   Assessment and Plan   Acute L MCA CVA, L M2 occlusion  Hx sz, with concern for SE  Baseline cognitive deficit, possible dementia   Acute encephalopathy  - s/p thrombectomy, TICI 2b on 12/28 - likely cardioembolic, known afib not on AC P - per Neuro  - remains off sedation with poor neuro exam - SBP goal <160 - ASA 81mg   - MRI brain as above - echo LVEF 60-65%, no RWMA, diastolic indeterminate, RVSF mildly reduced  - remains on LTM, depakote  750mg  q12hr, keppra  1g BID - seizure precautions - neuroprotective measures - cont aricept   Acute respiratory insufficiency related to above Respiratory alkalosis, improving  P - cont full LTVV support, 4-8cc/kg IBW with goal Pplat <30 and DP<15  - daily SBT- weaning this morning, 15/5.  Mental status remains barrier for extubation - VAP prevention protocol/ PPI - PAD protocol for sedation> currently not needing sedation, prn fent - intermittent CXR/ ABG - wean FiO2 as able for SpO2 >92% - aggressive pulmonary hygiene - prn BD   Afib not on AC  -prior GIB, also note prior rationale of VPA interaction  HTN  P - rate controlled on monitor - optimize electrolytes  - echo as above   AKI  AGAMA Hypomagnesemia, resolved Hypophosphatemia, resolved> now hyperphos P - worsening sCr with oliguria.  Urine clear yellow.  Bladder US  neg for retention - increase MIVF LR 75> - check renal ultrasound and urine lytes - add enteral bicarb - follow renal indices and UOP   Fever, leukocytosis  - unclear etiology, cannot rule out infectious process yet> CXR neg, UTI +leuks, WBC 21-50 - sz could account for fever, lactic, tachycardia - ddx for meningitis, less doubtful and MRI doesn't support P - remains hemodynamically stable not requiring pressors - afebrile, pending CBC this am.  Will check PCT.  MRSA PCR +.  Cont ctx and change vanc to linezolid  given renal indices - add on UC and send  trach asp - trend WBC/ fever curve  - tylenol  prn   Lactic acidosis  P - improving 12/29  At risk for malnutrition - cont EN per RN/ OGT w/ scheduled bowel regimen - empiric thiamine     No family at bedside.  Pending update to legal guardian 12/30.   Labs   CBC: Recent Labs  Lab 12/31/23 1412 12/31/23 1434 12/31/23 2034 01/01/24 0455 01/01/24 1556  WBC  --  15.5*  --  18.0*  --   NEUTROABS  --  12.5*  --   --   --   HGB 16.7 13.5 12.2* 12.7* 12.2*  HCT 49.0 42.6 36.0* 38.2* 36.0*  MCV  --  84.0  --  81.1  --   PLT  --  189  --  169  --     Basic Metabolic Panel: Recent Labs  Lab 12/31/23 1412 12/31/23 1434 12/31/23 2034 01/01/24 0455 01/01/24 1132 01/01/24 1556 01/02/24 0420  NA 144 143 142 141  --  142 142  K 3.3* 3.2* 3.6 3.7  --  4.5 4.6  CL 100 101  --  105  --   --  106  CO2  --  26  --  23  --   --  20*  GLUCOSE 117* 115*  --  145*  --   --  117*  BUN 31* 28*  --  34*  --   --  53*  CREATININE 1.40* 1.11  --  1.51*  --   --  3.32*  CALCIUM   --  9.7  --  9.4  --   --  9.6  MG  --   --   --  1.5* 1.6*  --  2.2  PHOS  --   --   --  2.0*  --   --  6.2*   GFR: Estimated Creatinine Clearance: 26.9 mL/min (A) (by C-G formula based on SCr of 3.32 mg/dL (H)). Recent Labs  Lab 12/31/23 1434 12/31/23 1849 12/31/23 2057 01/01/24 0455 01/01/24 1304  WBC 15.5*  --   --  18.0*  --   LATICACIDVEN  --  3.5* 2.3* 3.1* 2.4*    Liver Function Tests: Recent Labs  Lab 12/31/23 1434 01/02/24 0420  AST 24  --   ALT 6  --   ALKPHOS 71  --   BILITOT 0.7  --   PROT 7.5  --   ALBUMIN 3.5 2.6*   No results for input(s): LIPASE, AMYLASE in the last 168 hours. Recent Labs  Lab 12/31/23 1434  AMMONIA 27    ABG    Component Value Date/Time   PHART 7.466 (H) 01/01/2024 1556   PCO2ART 32.7 01/01/2024 1556   PO2ART 179 (H) 01/01/2024 1556   HCO3 23.6 01/01/2024 1556   TCO2 25 01/01/2024 1556   ACIDBASEDEF 1.0 08/21/2021 1821   O2SAT 100 01/01/2024  1556     Coagulation Profile: Recent Labs  Lab 12/31/23 1434  INR 1.2    Cardiac Enzymes: Recent Labs  Lab 12/31/23 1434  CKTOTAL 43*    HbA1C: Hgb A1c MFr Bld  Date/Time Value Ref Range Status  12/31/2023 08:57 PM 5.6 4.8 - 5.6 % Final    Comment:    (NOTE) Diagnosis of Diabetes The following HbA1c ranges recommended by the American Diabetes Association (ADA) may be used as an aid in the diagnosis of diabetes mellitus.  Hemoglobin             Suggested A1C NGSP%              Diagnosis  <5.7                   Non Diabetic  5.7-6.4                Pre-Diabetic  >6.4                   Diabetic  <7.0                   Glycemic control for                       adults with diabetes.    05/12/2022 03:09 PM 6.1 (H) 4.8 - 5.6 % Final    Comment:    (NOTE) Pre diabetes:  5.7%-6.4%  Diabetes:              >6.4%  Glycemic control for   <7.0% adults with diabetes     CBG: Recent Labs  Lab 01/01/24 1127 01/01/24 1528 01/01/24 2000 01/02/24 0004 01/02/24 0407  GLUCAP 118* 118* 106* 97 97   CRITICAL CARE Performed by: Lyle Pesa   Total critical care time: 38 minutes  Critical care time was exclusive of separately billable procedures and treating other patients.  Critical care was necessary to treat or prevent imminent or life-threatening deterioration.  Critical care was time spent personally by me on the following activities: development of treatment plan with patient and/or surrogate as well as nursing, discussions with consultants, evaluation of patient's response to treatment, examination of patient, obtaining history from patient or surrogate, ordering and performing treatments and interventions, ordering and review of laboratory studies, ordering and review of radiographic studies, pulse oximetry and re-evaluation of patient's condition.    Lyle Pesa, NP [facility] 01/02/2024, 8:07 AM  See Amion for  pager If no response to pager , please call [phone] until 7pm After 7:00 pm call Elink  [phone]     "

## 2024-01-03 DIAGNOSIS — I69391 Dysphagia following cerebral infarction: Secondary | ICD-10-CM | POA: Diagnosis not present

## 2024-01-03 DIAGNOSIS — R569 Unspecified convulsions: Secondary | ICD-10-CM | POA: Diagnosis not present

## 2024-01-03 DIAGNOSIS — G40901 Epilepsy, unspecified, not intractable, with status epilepticus: Secondary | ICD-10-CM | POA: Diagnosis not present

## 2024-01-03 DIAGNOSIS — D72829 Elevated white blood cell count, unspecified: Secondary | ICD-10-CM | POA: Diagnosis not present

## 2024-01-03 DIAGNOSIS — I63512 Cerebral infarction due to unspecified occlusion or stenosis of left middle cerebral artery: Secondary | ICD-10-CM | POA: Diagnosis not present

## 2024-01-03 DIAGNOSIS — G40909 Epilepsy, unspecified, not intractable, without status epilepticus: Secondary | ICD-10-CM | POA: Diagnosis not present

## 2024-01-03 DIAGNOSIS — I4891 Unspecified atrial fibrillation: Secondary | ICD-10-CM | POA: Diagnosis not present

## 2024-01-03 DIAGNOSIS — I63412 Cerebral infarction due to embolism of left middle cerebral artery: Secondary | ICD-10-CM | POA: Diagnosis not present

## 2024-01-03 DIAGNOSIS — I110 Hypertensive heart disease with heart failure: Secondary | ICD-10-CM | POA: Diagnosis not present

## 2024-01-03 DIAGNOSIS — G9340 Encephalopathy, unspecified: Secondary | ICD-10-CM | POA: Diagnosis not present

## 2024-01-03 DIAGNOSIS — N179 Acute kidney failure, unspecified: Secondary | ICD-10-CM | POA: Diagnosis not present

## 2024-01-03 DIAGNOSIS — R34 Anuria and oliguria: Secondary | ICD-10-CM

## 2024-01-03 DIAGNOSIS — I509 Heart failure, unspecified: Secondary | ICD-10-CM | POA: Diagnosis not present

## 2024-01-03 DIAGNOSIS — A419 Sepsis, unspecified organism: Secondary | ICD-10-CM

## 2024-01-03 DIAGNOSIS — G9341 Metabolic encephalopathy: Secondary | ICD-10-CM

## 2024-01-03 DIAGNOSIS — E785 Hyperlipidemia, unspecified: Secondary | ICD-10-CM | POA: Diagnosis not present

## 2024-01-03 DIAGNOSIS — I69398 Other sequelae of cerebral infarction: Secondary | ICD-10-CM | POA: Diagnosis not present

## 2024-01-03 LAB — BASIC METABOLIC PANEL WITH GFR
Anion gap: 19 — ABNORMAL HIGH (ref 5–15)
BUN: 77 mg/dL — ABNORMAL HIGH (ref 8–23)
CO2: 17 mmol/L — ABNORMAL LOW (ref 22–32)
Calcium: 8.8 mg/dL — ABNORMAL LOW (ref 8.9–10.3)
Chloride: 109 mmol/L (ref 98–111)
Creatinine, Ser: 4.75 mg/dL — ABNORMAL HIGH (ref 0.61–1.24)
GFR, Estimated: 13 mL/min — ABNORMAL LOW
Glucose, Bld: 115 mg/dL — ABNORMAL HIGH (ref 70–99)
Potassium: 4.2 mmol/L (ref 3.5–5.1)
Sodium: 145 mmol/L (ref 135–145)

## 2024-01-03 LAB — CBC
HCT: 33.9 % — ABNORMAL LOW (ref 39.0–52.0)
Hemoglobin: 10.8 g/dL — ABNORMAL LOW (ref 13.0–17.0)
MCH: 26.5 pg (ref 26.0–34.0)
MCHC: 31.9 g/dL (ref 30.0–36.0)
MCV: 83.3 fL (ref 80.0–100.0)
Platelets: 123 K/uL — ABNORMAL LOW (ref 150–400)
RBC: 4.07 MIL/uL — ABNORMAL LOW (ref 4.22–5.81)
RDW: 14.5 % (ref 11.5–15.5)
WBC: 18.6 K/uL — ABNORMAL HIGH (ref 4.0–10.5)
nRBC: 0.2 % (ref 0.0–0.2)

## 2024-01-03 LAB — GLUCOSE, CAPILLARY
Glucose-Capillary: 100 mg/dL — ABNORMAL HIGH (ref 70–99)
Glucose-Capillary: 104 mg/dL — ABNORMAL HIGH (ref 70–99)
Glucose-Capillary: 106 mg/dL — ABNORMAL HIGH (ref 70–99)
Glucose-Capillary: 118 mg/dL — ABNORMAL HIGH (ref 70–99)
Glucose-Capillary: 71 mg/dL (ref 70–99)
Glucose-Capillary: 89 mg/dL (ref 70–99)
Glucose-Capillary: 91 mg/dL (ref 70–99)

## 2024-01-03 LAB — MAGNESIUM: Magnesium: 2.2 mg/dL (ref 1.7–2.4)

## 2024-01-03 MED ORDER — POTASSIUM CHLORIDE 20 MEQ PO PACK
20.0000 meq | PACK | Freq: Once | ORAL | Status: AC
  Administered 2024-01-03: 20 meq
  Filled 2024-01-03: qty 1

## 2024-01-03 MED ORDER — FUROSEMIDE 10 MG/ML IJ SOLN
60.0000 mg | Freq: Three times a day (TID) | INTRAMUSCULAR | Status: AC
  Administered 2024-01-03 (×2): 60 mg via INTRAVENOUS
  Filled 2024-01-03 (×2): qty 6

## 2024-01-03 MED ORDER — ALBUMIN HUMAN 25 % IV SOLN
25.0000 g | Freq: Once | INTRAVENOUS | Status: AC
  Administered 2024-01-03: 25 g via INTRAVENOUS
  Filled 2024-01-03: qty 100

## 2024-01-03 NOTE — Progress Notes (Signed)
 STROKE TEAM PROGRESS NOTE    SIGNIFICANT HOSPITAL EVENTS  12/28: Presented to [facility] due to acute unresponsiveness.  CTA showed left M2 occlusion.  Transferred to [facility] for emergent thrombectomy.  INTERIM HISTORY/SUBJECTIVE  Pending MRI, echo.  Off Cleviprex , no sedation. Continues on broad-spectrum antibiotics  No family at bedside, RN at bedside.  Patient with minimal withdrawal on right, does not follow commands. Will start Aspirin  per tube.  MRI is pending  OBJECTIVE Patient remains off sedation but neurological exam is yet poor and unchanged.  He remains intubated for respiratory failure.  Remains unresponsive but only has some semipurposeful movements on the left and minimal movement in the right leg. Patient is renal function appears to be worsening despite being given fluids and white count is elevated. CBC    Component Value Date/Time   WBC 18.6 (H) 01/03/2024 0357   RBC 4.07 (L) 01/03/2024 0357   HGB 10.8 (L) 01/03/2024 0357   HCT 33.9 (L) 01/03/2024 0357   PLT 123 (L) 01/03/2024 0357   MCV 83.3 01/03/2024 0357   MCH 26.5 01/03/2024 0357   MCHC 31.9 01/03/2024 0357   RDW 14.5 01/03/2024 0357   LYMPHSABS 0.3 (L) 12/31/2023 1434   MONOABS 2.6 (H) 12/31/2023 1434   EOSABS 0.0 12/31/2023 1434   BASOSABS 0.0 12/31/2023 1434    BMET    Component Value Date/Time   NA 145 01/03/2024 0357   K 4.2 01/03/2024 0357   CL 109 01/03/2024 0357   CO2 17 (L) 01/03/2024 0357   GLUCOSE 115 (H) 01/03/2024 0357   BUN 77 (H) 01/03/2024 0357   CREATININE 4.75 (H) 01/03/2024 0357   CALCIUM  8.8 (L) 01/03/2024 0357   GFRNONAA 13 (L) 01/03/2024 0357    IMAGING past 24 hours No results found.   Vitals:   01/03/24 1315 01/03/24 1330 01/03/24 1345 01/03/24 1400  BP:    (!) 83/69  Pulse: 97 94 92 93  Resp: (!) 24 (!) 24 20 (!) 24  Temp: 98.4 F (36.9 C) 98.4 F (36.9 C) 98.4 F (36.9 C) 98.4 F (36.9 C)  TempSrc:      SpO2: 97% 99% 100% 100%  Weight:       Height:       PHYSICAL EXAM General: Acutely ill, no sedation CV: A-fib on monitor, controlled rate Respiratory: Intubated and mechanically ventilated  NEURO:  Mental Status/Language: obtunded, does not open eyes to voice or stimuli, does not follow commands.   Cranial Nerves:  II: PERRL. No blink to threat on left III, IV, VI: Left eye deviation V, VII: ETT in place. Weak Left Corneal Reflex.  IX, X: ETT in place. Cough/Gag present.  KP:Dynloizm shrug 5/5. XII: tongue is midline without fasciculations. Motor:  No spontaneous movement seen.  Sensation: RUE: slight withdraw only RLE: slight withdraw, weak with just the toes moving. LUE: mild withdraw LLE: minimal withdraw, but stronger than right.  Coordination: unable to perform Gait- deferred      ASSESSMENT/PLAN  Nathan Shepherd is a 68 y.o. male with history of left PCA stroke, poststroke epilepsy (Depakote  and Keppra ), A-fib not on anticoagulation, GI bleed September 2025 with supratherapeutic INR who [name] 12/28 due to acute unresponsiveness.  EMS noted right gaze deviation, no speech, not following commands, no withdrawal to stimuli.  Intubated for airway protection.  Patient does live in group home and it was difficult to determine his baseline.  EMS reported typically verbal and ambulatory with some  cognitive impairment at baseline (on Aricept ).  CTA revealed left M2 occlusion.  Patient transferred emergently to [facility] for thrombectomy.  NIH on Admission to [facility] 40.  Acute Ischemic Infarct:  left MCA territory s/p mechanical thrombectomy with 2B revascularization Etiology:  likely cardioembolic, known A-fib not on anticoagulation Code Stroke CT head  No acute intracranial abnormality. ASPECTS score 10. Remote infarcts and chronic microvascular ischemic disease. CTA head & neck  Occlusion of a proximal M2 branch of the left MCA. Reconstitution of multiple M3 and M4 branches  in the posterior left MCA territory, likely via collaterals. 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 Additional mild atherosclerosis as above. MRI   left parietal, insular and temporal white matter acute infarct.  Chronic encephalomalacia left medial occipital lobe. 2D Echo left ventricular EF 60-65%.  Left atrial size normal. LDL 46 HgbA1c 5.6 VTE prophylaxis - Heparin  SQ No antithrombotic prior to admission, now on ASA 81mg  per tube or rectal.  Therapy recommendations:  Pending Disposition:  pending  Acute Respiratory Failure s/p intubation CCM Attending Extubation precluded by mental status.   Hx of Seizures Acute Breakthrough Seizures Home Meds: Depakote  750mg  Q12H, Keppra  1000mg  BID Continued via IV access Pending continuous EEG hook-up Currently off sedation, no seizure activity seen  Depakote  level: 50 Keppra  level: pending  Afib, no OAC No OAC d/t history of GI Bleed Started on Aspirin  only Home medication Depakote , interaction noted   Hypertension Home meds:  Coreg  3.125mg  BID, torsemide  20mg  QAM Stable, Clevi off Blood Pressure Goal: SBP less than 160   Hyperlipidemia Home meds:  Crestor  20mg  LDL 46, goal < 70 High intensity statin not indicated due to LDL within goal on current treatment Continue statin when able to take PO  Dysphagia Patient has post-stroke dysphagia, SLP consulted    Diet   Diet NPO time specified   NG tube in place  Other Stroke Risk Factors Congestive heart failure  Other Active Problems Leukocytosis, Fever Continues on broad-spectrum antibiotics Meningitis less likely Blood cultures pending Leukocytosis could be caused by seizures Monitor, continue to trend Hx of CKD, stage 3 Hx of Cognitive Impairment d/t Dementia Home Aricept  continued  Hospital day # 3      Patient with prior  history of stroke and seizures and atrial fibrillation presented with sudden onset of mental status changes with exam suggesting significant aphasia with left M2 occlusion and underwent successful mechanical thrombectomy with neurological exam yet remains poor with patient on ventilator support.  Patient neurological exam remains quite poor and out of proportion to the size of his stroke likely due to superimposed medical comorbidities and encephalopathy from renal failure..  Fluid challenge for oliguria and worsening renal function per CCM has not worked and renal function is further declining..   No family available at the bedside.  Need to have goals of care discussion with family and palliative care consult would be appropriate. .  Discussed with Dr. Annella critical care medicine.  This patient is critically ill and at significant risk of neurological worsening, death and care requires constant monitoring of vital signs, hemodynamics,respiratory and cardiac monitoring, extensive review of multiple databases, frequent neurological assessment, discussion with family, other specialists and medical decision making of high complexity.I have made any additions or clarifications directly to the above note.This critical care time does not reflect procedure time, or teaching time or supervisory  time of PA/NP/Med Resident etc but could involve care discussion time.  I spent 30 minutes of neurocritical care time  in the care of  this patient.        Eather Popp, MD Medical Director [facility] Pager: [phone] 01/03/2024 2:04 PM   To contact Stroke Continuity provider, please refer to [email or web address]. After hours, contact General Neurology

## 2024-01-03 NOTE — Progress Notes (Signed)
 VENTILATOR WEAN NOTE 01/03/2024  Start Mode: PRVC  Wean Mode: Pressure Support  Duration before failure: 1 min  Reason for failure: Other: increased RR  Notes: Patient placed on CPAP/PSV of 15/5.  Patient immediately became tachypneic.  Was placed back on full support ventilator settings and is tolerating well.  Will continue to monitor.

## 2024-01-03 NOTE — Procedures (Signed)
 Patient Name: Nathan Shepherd  MRN: [id number]  Epilepsy Attending: Arlin MALVA Krebs  Referring Physician/Provider: Judithe Rocky BROCKS, NP  Duration: 01/02/2024 1100 to 01/02/2024 1430   Patient history: 68yo M with acute L MCA CVA, L M2 occlusion  Hx sz, with concern for status. EEG to evaluate for seizure   Level of alertness:  comatose/ lethargic    AEDs during EEG study: LEV, VPA   Technical aspects: This EEG study was done with scalp electrodes positioned according to the 10-20 International system of electrode placement. Electrical activity was reviewed with band pass filter of 1-70Hz , sensitivity of 7 uV/mm, display speed of 66mm/sec with a 60Hz  notched filter applied as appropriate. EEG data were recorded continuously and digitally stored.  Video monitoring was available and reviewed as appropriate.   Description: EEG showed continuous generalized and maximal left fronto-temporal region 3 to 6 Hz theta-delta slowing admixed with 12-15hz  beta activity. Hyperventilation and photic stimulation were not performed.     ABNORMALITY - Continuous slow, generalized and maximal left fronto-temporal region    IMPRESSION: This study is uggestive of cortical dysfunction arising from fronto-temporal region likely secondary to underlying structural abnormality. Additionally there is generalized cerebral dysfunction (encephalopathy). No seizures or epileptiform discharges were seen throughout the recording.   Raveen Wieseler O Caryssa Elzey

## 2024-01-03 NOTE — Progress Notes (Signed)
 "  NAME:  Nathan Shepherd, MRN:  [id number], DOB:  1955/01/25, LOS: 3 ADMISSION DATE:  12/31/2023, CONSULTATION DATE:  01/03/2024 REFERRING MD:  [name], CHIEF COMPLAINT:  cva   History of Present Illness:  Nathan Shepherd is a 68 y.o. 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  At 1 PM EMS was called because patient had noted right gaze deviation and was not answering questions or following commands.  He was taken to [facility] where he was out of the window for thrombolytic, he was treated with Vimpat  and Ativan  and had minimal subsequent seizure activity.  Him imaging reveals left M2 occlusion for which he was taken for IR thrombectomy at [facility].  2B TICI score after 3 passes without complication.  The patient was transferred to the ICU intubated.  Also had noted fevers earlier today though not on on arrival to the ICU.  Blood cultures and broad-spectrum antibiotics initiated.  Pertinent  Medical History   has a past medical history of CHF (congestive heart failure) (HCC), CKD (chronic kidney disease) stage 3, GFR 30-59 ml/min (HCC), Edema, Hyperlipemia, Hypertension, and Seizures (HCC).  Significant Hospital Events: Including procedures, antibiotic start and stop dates in addition to other pertinent events   12/28 left M2 occlusion and possible seizure activity, thrombectomy and transferred to the ICU intubated 12/30 contact made with legal guardian and pt's mother, mother called unit and is planning to travel from [place] to visit pt in next few days 12/31 EEG demonstrated generalized cerebral dysfunction with no seizures or definite epileptiform discharges observed.   Interim History / Subjective:  NAEON  Objective    Blood pressure 100/72, pulse 96, temperature 98.6 F (37 C), resp. rate (!) 27, height 6' 4 (1.93 m), weight  90.1 kg, SpO2 100%.    Vent Mode: PRVC FiO2 (%):  [40 %] 40 % Set Rate:  [14 bmp] 14 bmp Vt Set:  [500 mL] 500 mL PEEP:  [5 cmH20] 5 cmH20 Pressure Support:  [15 cmH20] 15 cmH20 Plateau Pressure:  [21 cmH20] 21 cmH20   Intake/Output Summary (Last 24 hours) at 01/03/2024 0750 Last data filed at 01/03/2024 0700 Gross per 24 hour  Intake 5437.71 ml  Output 745 ml  Net 4692.71 ml   Filed Weights   12/31/23 2000 01/02/24 0500  Weight: 89.8 kg 90.1 kg   Physical Exam: General: Older male, critically ill. Neuro: Not responsive, no sedation. Some flickering of toes intermittently. HEENT: [employer]/AT. Sclerae anicteric. ETT in place. Cardiovascular: IRIR, no M/R/G.  Lungs: Respirations even and unlabored.  CTA bilaterally, No W/R/R. Abdomen: BS x 4, soft, NT/ND.  Musculoskeletal: No gross deformities, no edema.   Patient Lines/Drains/Airways Status     Active Line/Drains/Airways     Name Placement date Placement time Site Days   Arterial Line 12/31/23 Left Radial 12/31/23  1700  Radial  3   Peripheral IV 12/31/23 20 G Anterior;Distal;Right;Upper Arm 12/31/23  1435  Arm  3   Peripheral IV 12/31/23 20 G Left;Posterior Forearm 12/31/23  1542  Forearm  3   Peripheral IV 12/31/23 22 G Posterior;Right Hand 12/31/23  1548  Hand  3   NG/OG Vented/Dual Lumen 16 Fr. Oral Marking at nare/corner of mouth 65 cm 12/31/23  1520  Oral  3   Urethral Catheter tori leonard rn Temperature probe 16  Fr. 12/31/23  1530  Temperature probe  3   Airway 8 mm 12/31/23  1518  -- 3           Assessment and Plan   Acute L MCA CVA, L M2 occlusion - felt to be cardioembolic in setting of known A.fib not on AC. S/p thrombectomy 12/28 with TICI 2 revascularization. MRI brain 12/29 with multiple new acute/subacute infarcts. Hx sz, with concern for SE  Baseline cognitive deficit, possible dementia   Acute encephalopathy  P - per Neuro  - remains off sedation with poor neuro exam - SBP goal <160 - ASA 81mg   -  echo LVEF 60-65%, no RWMA, diastolic indeterminate, RVSF mildly reduced  - remains on LTM, depakote  750mg  q12hr, keppra  1g BID - seizure precautions - neuroprotective measures - cont aricept  - Poor prognosis relayed to pt's guardian and pt's mother 12/30. Mother lives in [place] and is planning to come to the hospital in the next few days.  Acute respiratory insufficiency related to above Respiratory alkalosis, improving  P - cont full LTVV support, 4-8cc/kg IBW with goal Pplat <30 and DP<15  - daily SBT- weaning this morning, 15/5.  Mental status remains barrier for extubation - VAP prevention protocol/ PPI - PAD protocol for sedation> currently not needing sedation, prn fent - intermittent CXR/ ABG - wean FiO2 as able for SpO2 >92% - aggressive pulmonary hygiene - prn BD  Afib not on AC  -prior GIB, also note prior rationale of VPA interaction  HTN  P - rate controlled on monitor - optimize electrolytes  - echo as above   AKI - oliguric, +8L net. Renal US  12/30 unremarkable.  AGMA Hypomagnesemia, resolved Hypophosphatemia, resolved> now hyperphos P - d/c fluids today, add 2 doses Lasix - cont enteral bicarb - follow renal indices and UOP   Fever, leukocytosis  - unclear etiology, cannot rule out infectious process yet> CXR neg, UTI +leuks, WBC 21-50 - sz could account for fever, lactic, tachycardia - ddx for meningitis, less doubtful and MRI doesn't support - remains hemodynamically stable not requiring pressors P - continue empiric abx with linezolid  and ctx given PCT (exacerbated by renal failure and stroke) - follow urine cx and trach aspirate sent 12/30 - trend WBC/ fever curve  - tylenol  prn   At risk for malnutrition - cont EN per RN/ OGT w/ scheduled bowel regimen - empiric thiamine     Legal guardian, Nathan Shepherd contacted by our team 12/30. Pt's mother Nathan Shepherd ([phone]) also called the unit and was unaware of pts admission. She lives in [place] but  plans to come to the hospital to visit him in the next few days.   CC time: 30 min.   Sammi Gore, PA - [facility] For pager details, please see AMION or use Epic chat  After 1900, please call [facility] for cross coverage needs 01/03/2024, 8:08 AM     "

## 2024-01-03 NOTE — Progress Notes (Signed)
 Notified Dr. Annella that Patient's SBP is in the 90s. MD ordered albumin.

## 2024-01-04 ENCOUNTER — Inpatient Hospital Stay (HOSPITAL_COMMUNITY)

## 2024-01-04 DIAGNOSIS — I110 Hypertensive heart disease with heart failure: Secondary | ICD-10-CM | POA: Diagnosis not present

## 2024-01-04 DIAGNOSIS — I69391 Dysphagia following cerebral infarction: Secondary | ICD-10-CM | POA: Diagnosis not present

## 2024-01-04 DIAGNOSIS — D72829 Elevated white blood cell count, unspecified: Secondary | ICD-10-CM | POA: Diagnosis not present

## 2024-01-04 DIAGNOSIS — I4891 Unspecified atrial fibrillation: Secondary | ICD-10-CM | POA: Diagnosis not present

## 2024-01-04 DIAGNOSIS — N179 Acute kidney failure, unspecified: Secondary | ICD-10-CM | POA: Diagnosis not present

## 2024-01-04 DIAGNOSIS — I63512 Cerebral infarction due to unspecified occlusion or stenosis of left middle cerebral artery: Secondary | ICD-10-CM | POA: Diagnosis not present

## 2024-01-04 DIAGNOSIS — G9340 Encephalopathy, unspecified: Secondary | ICD-10-CM | POA: Diagnosis not present

## 2024-01-04 DIAGNOSIS — I959 Hypotension, unspecified: Secondary | ICD-10-CM

## 2024-01-04 DIAGNOSIS — I63412 Cerebral infarction due to embolism of left middle cerebral artery: Secondary | ICD-10-CM | POA: Diagnosis not present

## 2024-01-04 DIAGNOSIS — E785 Hyperlipidemia, unspecified: Secondary | ICD-10-CM | POA: Diagnosis not present

## 2024-01-04 DIAGNOSIS — G40909 Epilepsy, unspecified, not intractable, without status epilepticus: Secondary | ICD-10-CM | POA: Diagnosis not present

## 2024-01-04 DIAGNOSIS — I509 Heart failure, unspecified: Secondary | ICD-10-CM | POA: Diagnosis not present

## 2024-01-04 DIAGNOSIS — R569 Unspecified convulsions: Secondary | ICD-10-CM | POA: Diagnosis not present

## 2024-01-04 DIAGNOSIS — I69398 Other sequelae of cerebral infarction: Secondary | ICD-10-CM | POA: Diagnosis not present

## 2024-01-04 LAB — GLUCOSE, CAPILLARY
Glucose-Capillary: 103 mg/dL — ABNORMAL HIGH (ref 70–99)
Glucose-Capillary: 101 mg/dL — ABNORMAL HIGH (ref 70–99)
Glucose-Capillary: 104 mg/dL — ABNORMAL HIGH (ref 70–99)
Glucose-Capillary: 97 mg/dL (ref 70–99)
Glucose-Capillary: 92 mg/dL (ref 70–99)

## 2024-01-04 LAB — BASIC METABOLIC PANEL WITH GFR
Anion gap: 19 — ABNORMAL HIGH (ref 5–15)
BUN: 97 mg/dL — ABNORMAL HIGH (ref 8–23)
CO2: 18 mmol/L — ABNORMAL LOW (ref 22–32)
Calcium: 9.4 mg/dL (ref 8.9–10.3)
Chloride: 110 mmol/L (ref 98–111)
Creatinine, Ser: 5.49 mg/dL — ABNORMAL HIGH (ref 0.61–1.24)
GFR, Estimated: 11 mL/min — ABNORMAL LOW
Glucose, Bld: 106 mg/dL — ABNORMAL HIGH (ref 70–99)
Potassium: 3.7 mmol/L (ref 3.5–5.1)
Sodium: 148 mmol/L — ABNORMAL HIGH (ref 135–145)

## 2024-01-04 LAB — POCT I-STAT 7, (LYTES, BLD GAS, ICA,H+H)
Acid-base deficit: 10 mmol/L — ABNORMAL HIGH (ref 0.0–2.0)
Bicarbonate: 15.7 mmol/L — ABNORMAL LOW (ref 20.0–28.0)
Calcium, Ion: 1.25 mmol/L (ref 1.15–1.40)
HCT: 36 % — ABNORMAL LOW (ref 39.0–52.0)
Hemoglobin: 12.2 g/dL — ABNORMAL LOW (ref 13.0–17.0)
O2 Saturation: 85 %
Patient temperature: 38.3
Potassium: 4.5 mmol/L (ref 3.5–5.1)
Sodium: 146 mmol/L — ABNORMAL HIGH (ref 135–145)
TCO2: 17 mmol/L — ABNORMAL LOW (ref 22–32)
pCO2 arterial: 35.1 mmHg (ref 32–48)
pH, Arterial: 7.264 — ABNORMAL LOW (ref 7.35–7.45)
pO2, Arterial: 60 mmHg — ABNORMAL LOW (ref 83–108)

## 2024-01-04 LAB — CBC
HCT: 30.4 % — ABNORMAL LOW (ref 39.0–52.0)
Hemoglobin: 9.7 g/dL — ABNORMAL LOW (ref 13.0–17.0)
MCH: 26.9 pg (ref 26.0–34.0)
MCHC: 31.9 g/dL (ref 30.0–36.0)
MCV: 84.4 fL (ref 80.0–100.0)
Platelets: 86 K/uL — ABNORMAL LOW (ref 150–400)
RBC: 3.6 MIL/uL — ABNORMAL LOW (ref 4.22–5.81)
RDW: 14.6 % (ref 11.5–15.5)
WBC: 16.7 K/uL — ABNORMAL HIGH (ref 4.0–10.5)
nRBC: 0.9 % — ABNORMAL HIGH (ref 0.0–0.2)

## 2024-01-04 LAB — URINE CULTURE: Culture: NO GROWTH

## 2024-01-04 LAB — PHOSPHORUS: Phosphorus: 7.1 mg/dL — ABNORMAL HIGH (ref 2.5–4.6)

## 2024-01-04 LAB — MAGNESIUM: Magnesium: 2.5 mg/dL — ABNORMAL HIGH (ref 1.7–2.4)

## 2024-01-04 MED ORDER — FUROSEMIDE 10 MG/ML IJ SOLN
120.0000 mg | Freq: Once | INTRAVENOUS | Status: AC
  Administered 2024-01-04: 120 mg via INTRAVENOUS
  Filled 2024-01-04: qty 10

## 2024-01-04 NOTE — Progress Notes (Signed)
 eLink Physician-Brief Progress Note Patient Name: Nathan Shepherd DOB: September 09, 1955 MRN: [id number]   Date of Service  01/04/2024  HPI/Events of Note  RN reports soft blood pressure as well as low urine output.  Also states that patient got albumin during the day but patient also received Lasix twice over the past couple of days with only 100 mL of urine output documented.  Patient is status post thrombectomy for acute CVA.  Encephalopathic.  eICU Interventions  Chart reviewed.  No interventions planned at this time.     Intervention Category Minor Interventions: Other:  Jerilynn Berg 01/04/2024, 12:43 AM

## 2024-01-04 NOTE — Progress Notes (Signed)
 2100- Elink notified regarding patients SBP sustaining in the 80s. No new orders at this time.   2145- Elink notified regarding patients RR sustaining in the 30s following the administration of 50mcg of fentanyl . Orders to obtain an ABG.   2230GLENWOOD Kerns MD to increase PEEP to 8 due to frequent desats to the 80s.   2300- Tube feeds held due to frequent episodes of emesis. Orders to turn OG to low intermittent suction.  0015- Elink notified regarding patients SBP now sustaining in the 70s. Orders for LR bolus and Levo   0130- Elink notified regarding patients SBP remaining in the 70s despite being maxed on Levo through peripheral IV and completion of the LR bolus. Orders to increase the max of Levo to 20.

## 2024-01-04 NOTE — Progress Notes (Addendum)
 eLink Physician-Brief Progress Note Patient Name: Nathan Shepherd DOB: 12/21/1955 MRN: [id number]   Date of Service  01/04/2024  HPI/Events of Note  Persistently febrile, now progressively more tachypneic in the 30s and sustaining.  Synchronous with the ventilator but tachypneic with alarms on any sedation, avoiding analogOsedation per primary team.  Question of tdoc DNR earlier today but no updated orders/documentation.  Acidemic on a.m. BMP  eICU Interventions  Will order lactic acid and beta hydroxybutyrate with a.m. labs.  ABG now.  Care thought to be futile, but no documented do not escalate, not a RRT candidate.  No indication for increased sedation/analgesia at this time.  May initiate low-dose bicarb infusion if the patient has substantial metabolic acidemia. Goal pH >7.25   2246 -compensated metabolic acidosis, sats are low 80s-100 with elevated peak pressures.  Increase PEEP to 8 and potentially higher to maintain saturation greater than 88%..  No bicarb infusion for now.  2329 -seems like he aspirated some degree of tube feeding.  Has some emesis with tube feeds as well.  Peak pressures are elevated but slowly improving after aspirating tube feeds from endotracheal tube.  Feeding tube was adjusted with radiograph showing large stomach bubble, potential ileus.  Will obtain KUB and hold tube feeds, switch to low intermittent suction for the time being  Intervention Category Intermediate Interventions: Respiratory distress - evaluation and management  Khara Renaud 01/04/2024, 10:13 PM

## 2024-01-04 NOTE — Progress Notes (Addendum)
 STROKE TEAM PROGRESS NOTE    SIGNIFICANT HOSPITAL EVENTS 12/28: Presented to [facility] due to acute unresponsiveness. CTA showed left M2 occlusion. Transferred to [facility] for emergent thrombectomy.   INTERIM HISTORY/SUBJECTIVE No family at the bedside.  Patient condition continues to decline now with worsening renal function and hypotension.  Patient remains intubated on full support.  BP currently in the 80s as well as overnight.  Waiting for palliative to see patient Labs this morning, phosphorus 7.1, sodium 148, worsening BUN and creatinine at 97 and 5.49, mag is 2.5, WBC 16.7 Neurological exam remains poor and unchanged with worsening labs  CBC    Component Value Date/Time   WBC 16.7 (H) 01/04/2024 0319   RBC 3.60 (L) 01/04/2024 0319   HGB 9.7 (L) 01/04/2024 0319   HCT 30.4 (L) 01/04/2024 0319   PLT 86 (L) 01/04/2024 0319   MCV 84.4 01/04/2024 0319   MCH 26.9 01/04/2024 0319   MCHC 31.9 01/04/2024 0319   RDW 14.6 01/04/2024 0319   LYMPHSABS 0.3 (L) 12/31/2023 1434   MONOABS 2.6 (H) 12/31/2023 1434   EOSABS 0.0 12/31/2023 1434   BASOSABS 0.0 12/31/2023 1434    BMET    Component Value Date/Time   NA 148 (H) 01/04/2024 0319   K 3.7 01/04/2024 0319   CL 110 01/04/2024 0319   CO2 18 (L) 01/04/2024 0319   GLUCOSE 106 (H) 01/04/2024 0319   BUN 97 (H) 01/04/2024 0319   CREATININE 5.49 (H) 01/04/2024 0319   CALCIUM  9.4 01/04/2024 0319   GFRNONAA 11 (L) 01/04/2024 0319    IMAGING past 24 hours No results found.  Vitals:   01/04/24 0700 01/04/24 0800 01/04/24 0900 01/04/24 1000  BP: 96/78 (!) 83/66 97/73 90/65   Pulse: 100 89 (!) 101 98  Resp: 19 (!) 22 (!) 26 (!) 24  Temp: 98.6 F (37 C) 98.8 F (37.1 C) 99 F (37.2 C) 99.1 F (37.3 C)  TempSrc:  Bladder    SpO2: 100% 100% 94% 99%  Weight:      Height:         PHYSICAL EXAM General: Critically ill, intubated no sedation CV: A-fib on monitor, controlled rate Respiratory: Intubated and mechanically  ventilated   NEURO:  Mental Status/Language: obtunded, does not open eyes to voice or stimuli, does not follow commands.    Cranial Nerves:  II: PERRL. No blink to threat on left III, IV, VI: Left eye deviation V, VII:  Weak Left Corneal Reflex.  IX, X: ETT in place. Cough/Gag present.  XI: Unable to assess XII: tongue is midline without fasciculations. Motor:  No spontaneous movement seen.  Sensation: No response to noxious stimuli Coordination: unable to perform Gait- deferred   ASSESSMENT/PLAN  Nathan Shepherd is a 69 y.o. male with history of left PCA stroke, poststroke epilepsy (Depakote  and Keppra ), A-fib not on anticoagulation, GI bleed September 2025 with supratherapeutic INR who [name] 12/28 due to acute unresponsiveness. EMS noted right gaze deviation, no speech, not following commands, no withdrawal to stimuli. Intubated for airway protection. Patient does live in group home and it was difficult to determine his baseline. EMS reported typically verbal and ambulatory with some cognitive impairment at baseline (on Aricept ). CTA revealed left M2 occlusion. Patient transferred emergently to [facility] for thrombectomy. NIH on Admission to [facility] 40.   Acute Ischemic Infarct:  left MCA territory s/p mechanical thrombectomy with 2B revascularization Etiology:  likely cardioembolic, known A-fib not on anticoagulation  Code Stroke CT head  No acute intracranial abnormality. ASPECTS score 10. Remote infarcts and chronic microvascular ischemic disease. CTA head & neck  Occlusion of a proximal M2 branch of the left MCA. Reconstitution of multiple M3 and M4 branches in the posterior left MCA territory, likely via collaterals. 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 Additional mild atherosclerosis as above. MRI   left  parietal, insular and temporal white matter acute infarct.  Chronic encephalomalacia left medial occipital lobe. 2D Echo left ventricular EF 60-65%.  Left atrial size normal. LDL 46 HgbA1c 5.6 VTE prophylaxis - Heparin  SQ No antithrombotic prior to admission, now on ASA 81mg  per tube or rectal.  Therapy recommendations:  Pending Disposition:  pending   Acute Respiratory Failure s/p intubation CCM Attending Extubation precluded by mental status.    Hx of Seizures Acute Breakthrough Seizures Home Meds: Depakote  750mg  Q12H, Keppra  1000mg  BID Continued via IV access EEG: - Continuous slow, generalized and maximal left fronto-temporal region  IMPRESSION: This study is uggestive of cortical dysfunction arising from fronto-temporal region likely secondary to underlying structural abnormality. Additionally there is generalized cerebral dysfunction (encephalopathy). No seizures or epileptiform discharges were seen throughout the recording. EEG discontinued 12/31 Currently off sedation, no seizure activity seen  Depakote  level: 50 Keppra  level: 33.8   Afib, no OAC No OAC d/t history of GI Bleed Started on Aspirin  only Home medication Depakote , interaction noted    Hypertension Home meds:  Coreg  3.125mg  BID, torsemide  20mg  QAM Stable, Clevi off Blood Pressure Goal: SBP less than 160    Hyperlipidemia Home meds:  Crestor  20mg  LDL 46, goal < 70 High intensity statin not indicated due to LDL within goal on current treatment Continue statin when able to take PO   Dysphagia Patient has post-stroke dysphagia, SLP consulted NG tube in place   AKI Worsening BUN and creatinine-97/5.49 Management per critical care Renal ultrasound unremarkable Did receive 2 doses of Lasix on 12/31  Other Stroke Risk Factors Congestive heart failure   Other Active Problems Leukocytosis, Fever Continues on broad-spectrum antibiotics Meningitis less likely Blood cultures pending Leukocytosis could  be caused by seizures Monitor, continue to trend Hx of CKD, stage 3 Hx of Cognitive Impairment d/t Dementia Home Aricept  continued  Hospital day # 4   Karna Geralds DNP, ACNPC-AG  [facility]  I have personally obtained history,examined this patient, reviewed notes, independently viewed imaging studies, participated in medical decision making and plan of care.ROS completed by me personally and pertinent positives fully documented  I have made any additions or clarifications directly to the above note. Agree with note above  Patient condition continues to decline and neurological prognosis is quite poor given his worsening renal function and now hypotension.  Patient is a ward of the state and will need necessary paperwork to be filled out to make him a  two-physician DNR as he is unlikely to survive this hospitalization.  Discussed with Dr. Annella critical care medicine This patient is critically ill and at significant risk of neurological worsening, death and care requires constant monitoring of vital signs, hemodynamics,respiratory and cardiac monitoring, extensive review of multiple databases, frequent neurological assessment, discussion with family, other specialists and medical decision making of high complexity.I have made any additions or clarifications directly to the above note.This critical care time does not reflect procedure time, or teaching time or supervisory time of PA/NP/Med Resident  etc but could involve care discussion time.  I spent 30 minutes of neurocritical care time  in the care of  this patient.     Eather Popp, MD Medical Director [facility] Pager: [phone] 01/04/2024 11:25 AM   To contact Stroke Continuity provider, please refer to [email or web address]. After hours, contact General Neurology

## 2024-01-04 NOTE — Progress Notes (Signed)
 "  NAME:  Nathan Shepherd, MRN:  [id number], DOB:  [birth year], LOS: 4 ADMISSION DATE:  12/31/2023, CONSULTATION DATE:  01/04/2024 REFERRING MD:  [name], CHIEF COMPLAINT:  cva   History of Present Illness:  Nathan Shepherd is a 69 y.o. 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  At 1 PM EMS was called because patient had noted right gaze deviation and was not answering questions or following commands.  He was taken to [facility] where he was out of the window for thrombolytic, he was treated with Vimpat  and Ativan  and had minimal subsequent seizure activity.  Him imaging reveals left M2 occlusion for which he was taken for IR thrombectomy at [facility].  2B TICI score after 3 passes without complication.  The patient was transferred to the ICU intubated.  Also had noted fevers earlier today though not on on arrival to the ICU.  Blood cultures and broad-spectrum antibiotics initiated.  Pertinent  Medical History   has a past medical history of CHF (congestive heart failure) (HCC), CKD (chronic kidney disease) stage 3, GFR 30-59 ml/min (HCC), Edema, Hyperlipemia, Hypertension, and Seizures (HCC).  Significant Hospital Events: Including procedures, antibiotic start and stop dates in addition to other pertinent events   12/28 left M2 occlusion and possible seizure activity, thrombectomy and transferred to the ICU intubated 12/30 contact made with legal guardian and pt's mother, mother called unit and is planning to travel from [place] to visit pt in next few days 12/31 EEG demonstrated generalized cerebral dysfunction with no seizures or definite epileptiform discharges observed.   Interim History / Subjective:  NAEON.  Neurologic function remains poor.  Ongoing AKI.  Did not respond to IV Lasix.  Will increase dose.  Vegetative state.  Expect  in-hospital death.  Objective    Blood pressure 90/65, pulse 98, temperature 99.1 F (37.3 C), resp. rate (!) 24, height 6' 4 (1.93 m), weight 91.4 kg, SpO2 99%.    Vent Mode: PRVC FiO2 (%):  [40 %-50 %] 50 % Set Rate:  [14 bmp-25 bmp] 14 bmp Vt Set:  [50 mL-500 mL] 500 mL PEEP:  [5 cmH20] 5 cmH20 Plateau Pressure:  [18 cmH20-21 cmH20] 20 cmH20   Intake/Output Summary (Last 24 hours) at 01/04/2024 1134 Last data filed at 01/04/2024 0800 Gross per 24 hour  Intake 1835.06 ml  Output 1335 ml  Net 500.06 ml   Filed Weights   01/02/24 0500 01/03/24 0711 01/04/24 0500  Weight: 90.1 kg 88 kg 91.4 kg   Physical Exam: General: Lying in bed Neuro: Sedation not responsive to any stimuli HEENT: ET tube in place Cardiovascular: Irregular rhythm, regular rate Lungs: Coarse ventilated breath sounds otherwise clear Abdomen: Not distended Musculoskeletal: Edema noted  Patient Lines/Drains/Airways Status     Active Line/Drains/Airways     Name Placement date Placement time Site Days   Arterial Line 12/31/23 Left Radial 12/31/23  1700  Radial  4   Peripheral IV 12/31/23 20 G Anterior;Distal;Right;Upper Arm 12/31/23  1435  Arm  4   Peripheral IV 12/31/23 22 G Posterior;Right Hand 12/31/23  1548  Hand  4   NG/OG Vented/Dual Lumen 16 Fr. Oral Marking at nare/corner of mouth 65 cm 12/31/23  1520  Oral  4   Flatus Tube/Pouch 01/03/24  --  --  1   Urethral Catheter tori  leonard rn Temperature probe 16 Fr. 12/31/23  1530  Temperature probe  4   Airway 8 mm 12/31/23  1518  -- 4           Assessment and Plan   Left MCA CVA: -- Appreciate neurology assistance -- AED per neurology -- Aspirin  and statin   Metabolic encephalopathy: Related to stroke.  Toy proportion to stroke.  Suspect significant time with seizures in the filter prior to medical interventions. --Minimize central acting medicines, on no sedation, last dose Ativan  12/28, fentanyl  gtt. 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 -- Holding medications for now, relative permissive hypertension, goal SBP 130-150, weaned off clevidipine    Fever, leukocytosis with MRSA Pna: Suspect leukemoid reaction, central fever. Has defervesced with antibiotics. MRSA on trach aspirate. -- linezolid  to continue   Oliguric renal failure: Initial AKI presumed prerenal.  In addition, concern for contrast nephropathy given timing of renal injury worsening after contrast load. Adequately fluid resuscitated. No significant UOP after IV lasix 12/31. -- Trial of lasix, increase dose to 120 mg IV -- Not a dialysis candidate given his severe neurologic injury   CRITICAL CARE Performed by: Donnice JONELLE Beals   Total critical care time: 31 minutes  Critical care time was exclusive of separately billable procedures and treating other patients.  Critical care was necessary to treat or prevent imminent or life-threatening deterioration.  Critical care was time spent personally by me on the following activities: development of treatment plan with patient and/or surrogate as well as nursing, discussions with consultants, evaluation of patient's response to treatment, examination of patient, obtaining history from patient or surrogate, ordering and performing treatments and interventions, ordering and review of laboratory studies, ordering and review of radiographic studies, pulse oximetry and re-evaluation of patient's condition.    Donnice JONELLE Beals, MD [facility] For pager details, please see AMION or use Epic chat  After 1900, please call [facility]  for cross coverage needs 01/04/2024, 11:34 AM     "

## 2024-01-04 DEATH — deceased

## 2024-01-05 DIAGNOSIS — I7090 Unspecified atherosclerosis: Secondary | ICD-10-CM | POA: Diagnosis not present

## 2024-01-05 DIAGNOSIS — R569 Unspecified convulsions: Secondary | ICD-10-CM | POA: Diagnosis not present

## 2024-01-05 DIAGNOSIS — N179 Acute kidney failure, unspecified: Secondary | ICD-10-CM | POA: Diagnosis not present

## 2024-01-05 DIAGNOSIS — I63512 Cerebral infarction due to unspecified occlusion or stenosis of left middle cerebral artery: Secondary | ICD-10-CM | POA: Diagnosis not present

## 2024-01-05 DIAGNOSIS — I959 Hypotension, unspecified: Secondary | ICD-10-CM | POA: Diagnosis not present

## 2024-01-05 DIAGNOSIS — I4891 Unspecified atrial fibrillation: Secondary | ICD-10-CM | POA: Diagnosis not present

## 2024-01-05 DIAGNOSIS — I69391 Dysphagia following cerebral infarction: Secondary | ICD-10-CM | POA: Diagnosis not present

## 2024-01-05 DIAGNOSIS — I509 Heart failure, unspecified: Secondary | ICD-10-CM | POA: Diagnosis not present

## 2024-01-05 DIAGNOSIS — I110 Hypertensive heart disease with heart failure: Secondary | ICD-10-CM | POA: Diagnosis not present

## 2024-01-05 DIAGNOSIS — J9601 Acute respiratory failure with hypoxia: Secondary | ICD-10-CM

## 2024-01-05 DIAGNOSIS — I739 Peripheral vascular disease, unspecified: Secondary | ICD-10-CM | POA: Diagnosis not present

## 2024-01-05 DIAGNOSIS — J690 Pneumonitis due to inhalation of food and vomit: Secondary | ICD-10-CM

## 2024-01-05 DIAGNOSIS — J9600 Acute respiratory failure, unspecified whether with hypoxia or hypercapnia: Secondary | ICD-10-CM | POA: Diagnosis not present

## 2024-01-05 DIAGNOSIS — I63412 Cerebral infarction due to embolism of left middle cerebral artery: Secondary | ICD-10-CM | POA: Diagnosis not present

## 2024-01-05 DIAGNOSIS — D72829 Elevated white blood cell count, unspecified: Secondary | ICD-10-CM | POA: Diagnosis not present

## 2024-01-05 LAB — GLUCOSE, CAPILLARY
Glucose-Capillary: 81 mg/dL (ref 70–99)
Glucose-Capillary: 83 mg/dL (ref 70–99)
Glucose-Capillary: 83 mg/dL (ref 70–99)
Glucose-Capillary: 89 mg/dL (ref 70–99)
Glucose-Capillary: 90 mg/dL (ref 70–99)
Glucose-Capillary: 98 mg/dL (ref 70–99)

## 2024-01-05 LAB — CULTURE, BLOOD (ROUTINE X 2)
Culture: NO GROWTH
Culture: NO GROWTH
Special Requests: ADEQUATE

## 2024-01-05 LAB — MAGNESIUM: Magnesium: 3.1 mg/dL — ABNORMAL HIGH (ref 1.7–2.4)

## 2024-01-05 LAB — BASIC METABOLIC PANEL WITH GFR
Anion gap: 24 — ABNORMAL HIGH (ref 5–15)
BUN: 125 mg/dL — ABNORMAL HIGH (ref 8–23)
CO2: 14 mmol/L — ABNORMAL LOW (ref 22–32)
Calcium: 9.4 mg/dL (ref 8.9–10.3)
Chloride: 108 mmol/L (ref 98–111)
Creatinine, Ser: 6.89 mg/dL — ABNORMAL HIGH (ref 0.61–1.24)
GFR, Estimated: 8 mL/min — ABNORMAL LOW
Glucose, Bld: 89 mg/dL (ref 70–99)
Potassium: 4.6 mmol/L (ref 3.5–5.1)
Sodium: 147 mmol/L — ABNORMAL HIGH (ref 135–145)

## 2024-01-05 LAB — CULTURE, RESPIRATORY W GRAM STAIN

## 2024-01-05 LAB — PHOSPHORUS: Phosphorus: 8.9 mg/dL — ABNORMAL HIGH (ref 2.5–4.6)

## 2024-01-05 MED ORDER — NUTRISOURCE FIBER PO PACK
1.0000 | PACK | Freq: Two times a day (BID) | ORAL | Status: DC
  Administered 2024-01-05 – 2024-01-07 (×4): 1
  Filled 2024-01-05 (×4): qty 1

## 2024-01-05 MED ORDER — SODIUM BICARBONATE 650 MG PO TABS
1300.0000 mg | ORAL_TABLET | Freq: Three times a day (TID) | ORAL | Status: DC
  Administered 2024-01-05 – 2024-01-07 (×5): 1300 mg
  Filled 2024-01-05 (×5): qty 2

## 2024-01-05 MED ORDER — SODIUM CHLORIDE 0.9 % IV SOLN
250.0000 mL | INTRAVENOUS | Status: AC
  Administered 2024-01-05: 250 mL via INTRAVENOUS

## 2024-01-05 MED ORDER — NOREPINEPHRINE 4 MG/250ML-% IV SOLN
0.0000 ug/min | INTRAVENOUS | Status: DC
  Administered 2024-01-05: 15 ug/min via INTRAVENOUS
  Administered 2024-01-05: 14 ug/min via INTRAVENOUS
  Administered 2024-01-05 (×3): 20 ug/min via INTRAVENOUS
  Administered 2024-01-05: 4 ug/min via INTRAVENOUS
  Administered 2024-01-06 – 2024-01-07 (×8): 20 ug/min via INTRAVENOUS
  Filled 2024-01-05 (×10): qty 250
  Filled 2024-01-05: qty 500
  Filled 2024-01-05 (×3): qty 250
  Filled 2024-01-05: qty 500

## 2024-01-05 MED ORDER — LACTATED RINGERS IV BOLUS
500.0000 mL | Freq: Once | INTRAVENOUS | Status: AC
  Administered 2024-01-05: 500 mL via INTRAVENOUS

## 2024-01-05 MED ORDER — SODIUM CHLORIDE 0.9 % IV SOLN
2.0000 g | INTRAVENOUS | Status: DC
  Administered 2024-01-05: 2 g via INTRAVENOUS
  Filled 2024-01-05: qty 12.5

## 2024-01-05 NOTE — Progress Notes (Signed)
 STROKE TEAM PROGRESS NOTE    SIGNIFICANT HOSPITAL EVENTS 12/28: Presented to [facility] due to acute unresponsiveness. CTA showed left M2 occlusion. Transferred to [facility] for emergent thrombectomy.   INTERIM HISTORY/SUBJECTIVE No family at the bedside.  Patient condition continues to decline now with further worsening renal function and hypotension.  Patient remains intubated on full support.  He developed respiratory distress mainly in attempt to wean..  BP currently in the 80s as well as overnight.  Palliative care team has been consulted.  Patient's ward of state has been contacted for DNR status by critical care team.    Neurological exam remains poor and unchanged with worsening labs.  CBC    Component Value Date/Time   WBC 16.7 (H) 01/04/2024 0319   RBC 3.60 (L) 01/04/2024 0319   HGB 12.2 (L) 01/04/2024 2234   HCT 36.0 (L) 01/04/2024 2234   PLT 86 (L) 01/04/2024 0319   MCV 84.4 01/04/2024 0319   MCH 26.9 01/04/2024 0319   MCHC 31.9 01/04/2024 0319   RDW 14.6 01/04/2024 0319   LYMPHSABS 0.3 (L) 12/31/2023 1434   MONOABS 2.6 (H) 12/31/2023 1434   EOSABS 0.0 12/31/2023 1434   BASOSABS 0.0 12/31/2023 1434    BMET    Component Value Date/Time   NA 147 (H) 01/05/2024 0434   K 4.6 01/05/2024 0434   CL 108 01/05/2024 0434   CO2 14 (L) 01/05/2024 0434   GLUCOSE 89 01/05/2024 0434   BUN 125 (H) 01/05/2024 0434   CREATININE 6.89 (H) 01/05/2024 0434   CALCIUM  9.4 01/05/2024 0434   GFRNONAA 8 (L) 01/05/2024 0434    IMAGING past 24 hours DG Abd 1 View Result Date: 01/05/2024 EXAM: 1 VIEW XRAY OF THE ABDOMEN 01/05/2024 12:20:51 AM COMPARISON: 12/31/2023 CLINICAL HISTORY: Ileus (HCC) FINDINGS: LINES, TUBES AND DEVICES: Enteric tube in place with tip at the gastric fundus. BOWEL: Similar appearance of dilated small bowel loops. SOFT TISSUES: No abnormal calcifications. BONES: No acute fracture. IMPRESSION: 1. Similar appearance of dilated small bowel loops, consistent with ileus.  Electronically signed by: Waddell Calk MD 01/05/2024 05:44 AM EST RP Workstation: [id number]   DG CHEST PORT 1 VIEW Result Date: 01/04/2024 EXAM: 1 VIEW(S) XRAY OF THE CHEST 01/04/2024 11:02:16 PM COMPARISON: 12/31/2023 CLINICAL HISTORY: Mechanical failure of ventilator (HCC) [id number] FINDINGS: LINES, TUBES AND DEVICES: Endotracheal tube in place with tip 5 cm above the carina. Enteric tube in place with tip and side port overlying the expected region of the gastric lumen. LUNGS AND PLEURA: No focal pulmonary opacity. No pleural effusion. No pneumothorax. HEART AND MEDIASTINUM: Atherosclerotic plaque noted. No acute abnormality of the cardiac and mediastinal silhouettes. BONES AND SOFT TISSUES: No acute osseous abnormality. IMPRESSION: 1. Endotracheal and enteric tubes are appropriately positioned. 2. No acute cardiopulmonary process. 3. Atherosclerotic plaque. Electronically signed by: Dorethia Molt MD 01/04/2024 11:39 PM EST RP Workstation: [id number]    Vitals:   01/05/24 1130 01/05/24 1145 01/05/24 1200 01/05/24 1215  BP:  (!) 69/28 (!) 60/36 (!) 70/36  Pulse: (!) 116 85 79 85  Resp: (!) 22 (!) 22 (!) 22 (!) 23  Temp: (!) 101.3 F (38.5 C) (!) 101.5 F (38.6 C) (!) 101.5 F (38.6 C) (!) 101.5 F (38.6 C)  TempSrc:      SpO2: 99% 100% 100% 100%  Weight:      Height:         PHYSICAL EXAM General: Critically ill, intubated no sedation CV: A-fib on monitor, controlled rate Respiratory: Intubated and  mechanically ventilated   NEURO:  Mental Status/Language: obtunded, does not open eyes to voice or stimuli, does not follow commands.    Cranial Nerves:  II: PERRL. No blink to threat on left III, IV, VI: Left eye deviation V, VII:  Weak Left Corneal Reflex.  IX, X: ETT in place. Cough/Gag present.  XI: Unable to assess XII: tongue is midline without fasciculations. Motor:  No spontaneous movement seen.  Sensation: No response to noxious stimuli Coordination: unable to  perform Gait- deferred   ASSESSMENT/PLAN  Nathan Shepherd is a 69 y.o. male with history of left PCA stroke, poststroke epilepsy (Depakote  and Keppra ), A-fib not on anticoagulation, GI bleed September 2025 with supratherapeutic INR who presented to [name] 12/28 due to acute unresponsiveness. EMS noted right gaze deviation, no speech, not following commands, no withdrawal to stimuli. Intubated for airway protection. Patient does live in group home and it was difficult to determine his baseline. EMS reported typically verbal and ambulatory with some cognitive impairment at baseline (on Aricept ). CTA revealed left M2 occlusion. Patient transferred emergently to [facility] for thrombectomy. NIH on Admission to [facility] 40.   Acute Ischemic Infarct:  left MCA territory s/p mechanical thrombectomy with 2B revascularization Etiology:  likely cardioembolic, known A-fib not on anticoagulation Code Stroke CT head  No acute intracranial abnormality. ASPECTS score 10. Remote infarcts and chronic microvascular ischemic disease. CTA head & neck  Occlusion of a proximal M2 branch of the left MCA. Reconstitution of multiple M3 and M4 branches in the posterior left MCA territory, likely via collaterals. 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 Additional mild atherosclerosis as above. MRI   left parietal, insular and temporal white matter acute infarct.  Chronic encephalomalacia left medial occipital lobe. 2D Echo left ventricular EF 60-65%.  Left atrial size normal. LDL 46 HgbA1c 5.6 VTE prophylaxis - Heparin  SQ No antithrombotic prior to admission, now on ASA 81mg  per tube or rectal.  Therapy recommendations:  Pending Disposition:  pending   Acute Respiratory Failure s/p intubation CCM Attending Extubation precluded by mental status.    Hx of  Seizures Acute Breakthrough Seizures Home Meds: Depakote  750mg  Q12H, Keppra  1000mg  BID Continued via IV access EEG: - Continuous slow, generalized and maximal left fronto-temporal region  IMPRESSION: This study is uggestive of cortical dysfunction arising from fronto-temporal region likely secondary to underlying structural abnormality. Additionally there is generalized cerebral dysfunction (encephalopathy). No seizures or epileptiform discharges were seen throughout the recording. EEG discontinued 12/31 Currently off sedation, no seizure activity seen  Depakote  level: 50 Keppra  level: 33.8   Afib, no OAC No OAC d/t history of GI Bleed Started on Aspirin  only Home medication Depakote , interaction noted    Hypertension Home meds:  Coreg  3.125mg  BID, torsemide  20mg  QAM Stable, Clevi off Blood Pressure Goal: SBP less than 160    Hyperlipidemia Home meds:  Crestor  20mg  LDL 46, goal < 70 High intensity statin not indicated due to LDL within goal on current treatment Continue statin when able to take PO   Dysphagia Patient has post-stroke dysphagia, SLP consulted NG tube in place   AKI Worsening BUN and creatinine-97/5.49 Management per critical care Renal ultrasound unremarkable Did receive 2 doses of Lasix on 12/31  Other Stroke Risk Factors Congestive heart failure   Other Active Problems Leukocytosis, Fever Continues on broad-spectrum antibiotics Meningitis less likely Blood  cultures pending Leukocytosis could be caused by seizures Monitor, continue to trend Hx of CKD, stage 3 Hx of Cognitive Impairment d/t Dementia Home Aricept  continued  Hospital day # 5      Patient condition continues to decline and neurological prognosis is quite poor given his worsening renal function and  hypotension.  Patient is a ward of the state and we will guardian has been contacted and agrees to DNR and addressed.  Expect patient to follow-up with known and continuing long-term life  support treatment of his renal failure  would need long-term dialysis  which will not be practical.  Discussed with Dr. Annella critical care medicine This patient is critically ill and at significant risk of neurological worsening, death and care requires constant monitoring of vital signs, hemodynamics,respiratory and cardiac monitoring, extensive review of multiple databases, frequent neurological assessment, discussion with family, other specialists and medical decision making of high complexity.I have made any additions or clarifications directly to the above note.This critical care time does not reflect procedure time, or teaching time or supervisory time of PA/NP/Med Resident etc but could involve care discussion time.  I spent 30 minutes of neurocritical care time  in the care of  this patient.     Eather Popp, MD Medical Director [facility] Pager: [phone] 01/05/2024 12:39 PM   To contact Stroke Continuity provider, please refer to [email or web address]. After hours, contact General Neurology

## 2024-01-05 NOTE — Anesthesia Postprocedure Evaluation (Signed)
"   Anesthesia Post Note  [name]  Procedure(s) Performed: RADIOLOGY WITH ANESTHESIA     Patient location during evaluation: SICU Anesthesia Type: General Level of consciousness: sedated Pain management: pain level controlled Vital Signs Assessment: post-procedure vital signs reviewed and stable Respiratory status: patient remains intubated per anesthesia plan Cardiovascular status: stable Postop Assessment: no apparent nausea or vomiting Anesthetic complications: no   No notable events documented.  Last Vitals:  Vitals:   01/05/24 0915 01/05/24 0930  BP: (!) 98/57 (!) 82/63  Pulse: 97 83  Resp: (!) 22 (!) 22  Temp: (!) 38.4 C (!) 38.4 C  SpO2: 100% 100%    Last Pain:  Vitals:   01/05/24 0400  TempSrc: Bladder  PainSc:                  Nathan Shepherd      "

## 2024-01-05 NOTE — Progress Notes (Signed)
 "  NAME:  Nathan Shepherd, MRN:  [id number], DOB:  1955/10/03, LOS: 5 ADMISSION DATE:  12/31/2023, CONSULTATION DATE:  01/05/2024 REFERRING MD:  Nathan Shepherd:  cva   History of Present Illness:  Nathan Shepherd is a 69 y.o. 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  At 1 PM EMS was called because patient had noted right gaze deviation and was not answering questions or following commands.  He was taken to [facility] where he was out of the window for thrombolytic, he was treated with Vimpat  and Ativan  and had minimal subsequent seizure activity.  Him imaging reveals left M2 occlusion for which he was taken for IR thrombectomy at [facility].  2B TICI score after 3 passes without complication.  The patient was transferred to the ICU intubated.  Also had noted fevers earlier today though not on on arrival to the ICU.  Blood cultures and broad-spectrum antibiotics initiated.  Pertinent  Medical History   has a past medical history of CHF (congestive heart failure) (HCC), CKD (chronic kidney disease) stage 3, GFR 30-59 ml/min (HCC), Edema, Hyperlipemia, Hypertension, and Seizures (HCC).  Significant Shepherd Events: Including procedures, antibiotic start and stop dates in addition to other pertinent events   12/28 left M2 occlusion and possible seizure activity, thrombectomy and transferred to the ICU intubated 12/30 contact made with legal guardian and pt's mother, mother called unit and is planning to travel from [place] to visit pt in next few days 12/31 EEG demonstrated generalized cerebral dysfunction with no seizures or definite epileptiform discharges observed. 12/31 increased lasix after no response from 12/30 1/1 overnight had aspiration of TF's, fever, possible ileus 1/2 contacted mother who is planning to come to [place]  from [place] on Saturday 1/3. Mentioned DNR to her. Unable to reach legal guardian unfortunately, did leave voicemail requesting a call back.  Interim History / Subjective:  Fevers overnight and aspiration of tube feeds. Temp 100.8 this AM. Was able to contact mother who is planning to come to [place] from [place] on Saturday 1/3. Mentioned DNR to her. Unable to reach legal guardian unfortunately, did leave voicemail requesting a call back.  Objective    Blood pressure 99/72, pulse 93, temperature (!) 100.8 F (38.2 C), resp. rate (!) 22, height 6' 3.98 (1.93 m), weight 85.3 kg, SpO2 100%.    Vent Mode: PRVC FiO2 (%):  [40 %-70 %] 60 % Set Rate:  [14 bmp-25 bmp] 22 bmp Vt Set:  [500 mL-600 mL] 600 mL PEEP:  [5 cmH20-8 cmH20] 8 cmH20 Plateau Pressure:  [16 cmH20-24 cmH20] 16 cmH20   Intake/Output Summary (Last 24 hours) at 01/05/2024 0818 Last data filed at 01/05/2024 0700 Gross per 24 hour  Intake 2527.85 ml  Output 3650 ml  Net -1122.15 ml   Filed Weights   01/03/24 0711 01/04/24 0500 01/05/24 0500  Weight: 88 kg 91.4 kg 85.3 kg   Physical Exam: General: Lying in bed Neuro: Not on any sedation, does not respond or interact HEENT: ET tube in place, tube feeds noted within suction tubing Cardiovascular: IRIR, no M/R/G Lungs: Coarse ventilated breath sounds otherwise clear Abdomen: Not distended Musculoskeletal: Edema noted  Patient Lines/Drains/Airways Status     Active Line/Drains/Airways     Name Placement date Placement time Site Days   Peripheral IV 12/31/23 20 G Anterior;Distal;Right;Upper  Arm 12/31/23  1435  Arm  5   Peripheral IV 12/31/23 22 G Posterior;Right Hand 12/31/23  1548  Hand  5   NG/OG Vented/Dual Lumen 16 Fr. Oral Marking at nare/corner of mouth 65 cm 12/31/23  1520  Oral  5   Flatus Tube/Pouch 01/03/24  --  --  2   Urethral Catheter Nathan leonard rn Temperature probe 16 Fr. 12/31/23  1530  Temperature probe  5   Airway 8 mm 12/31/23  1518  -- 5            Assessment and Plan   Left MCA CVA: -- Appreciate neurology assistance -- AED per neurology -- Aspirin  and statin   Metabolic encephalopathy: Related to stroke.  Out of proportion to stroke.  Suspect significant time with seizures in the field prior to medical interventions. 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 Remains rate controlled without agents at this time -- Supportive care  History of hypertension: Now hypotensive/shock state -- Holding medications for now as currently on NE   Ileus - with aspiration overnight 1/1 -- d/c tube feeds for now -- continue OGT to LIWS   Oliguric renal failure - worse and has not responded to diuresis. Initial AKI presumed prerenal.  In addition, concern for contrast nephropathy  and now exacerbated by sepsis and multiorgan failure.  AGMA - 2/2 above -- hold further lasix/diuresis -- Not a dialysis candidate given his severe neurologic injury -- explained to mother that currently has no life threatening electrolyte derangements; however, it is only a matter of time for this to become an issue   Goals of care: -- attempted to reach legal guardian 12/31, 1/1, 1/2. No answer and one voicemail not updated (Nathan Shepherd - out of office from October - November 2025). Have left a voicemail on second guardians line Nathan Shepherd) and requested she call back to the neuro  ICU. -- Dr Nathan Shepherd to discuss with Dr Nathan Shepherd of stroke team today for 2 physician DNR   CC time: 30 min.   Nathan Gore, PA - C [facility] For pager details, please see AMION or use Epic chat  After 1900, please call [facility] for cross coverage needs 01/05/2024, 8:37 AM      "

## 2024-01-05 NOTE — TOC Progression Note (Signed)
 Transition of Care [facility]) - Progression Note    Patient Details  Name: Nathan Shepherd MRN: [id number] Date of Birth: 06/25/1955  [facility]) CM/SW Contact  Inocente GORMAN Kindle, [name] Phone Number: 01/05/2024, 5:17 PM  Clinical Narrative:    CSW continuing to follow for needs.    Expected Discharge Plan: Skilled Nursing Facility Barriers to Discharge: Continued Medical Work up               Expected Discharge Plan and Services       Living arrangements for the past 2 months: Skilled Nursing Facility                                       Social Drivers of Health (SDOH) Interventions SDOH Screenings   Food Insecurity: No Food Insecurity (09/19/2023)  Housing: Low Risk (09/19/2023)  Transportation Needs: No Transportation Needs (09/19/2023)  Utilities: Not At Risk (09/19/2023)  Social Connections: Socially Isolated (09/19/2023)  Tobacco Use: Low Risk (12/31/2023)    Readmission Risk Interventions    09/12/2023    7:53 AM  Readmission Risk Prevention Plan  Transportation Screening Complete  HRI or Home Care Consult Complete  Social Work Consult for Recovery Care Planning/Counseling Complete  Palliative Care Screening Not Applicable  Medication Review [employer]) Complete

## 2024-01-05 NOTE — Addendum Note (Signed)
 Addendum  created 01/05/24 1109 by Epifanio Charleston, MD   Attestation recorded in Intraprocedure, Clinical Note Signed, Intraprocedure Attestations filed

## 2024-01-05 NOTE — IPAL (Signed)
" °  Interdisciplinary Goals of Care Family Meeting   Date carried out: 01/05/2024  Location of the meeting: Phone conference  Member's involved: Physician and Other: State appointed guardian  Durable Power of Attorney or acting medical decision maker: State appointed legal guardian Symphony Boice  Discussion: We discussed goals of care for [employer] .  Vegetative state following stroke.  Off sedation now for many days.  Will not improve.  Comatose vegetative state no hope for improvement.  In addition he is has worsening medical conditions including progressive renal failure and development of shock.  Discussed with guardian the patient is dying.  Recommend DNR.  She agreed to this.  I recommended comfort measures after allowing time for visitation.  I indicated we would discuss this further tomorrow with planned arrival of mother tomorrow.  To be clear, the mother has no decision rights.  This is a courtesy to allow her to visit her son.  Reportedly last seen in 2006.  Throughout the day he has gotten worse, more hypotensive.  I called the mother.  There was no answer.  I left a message.  Code status:   Code Status: Limited: Do not attempt resuscitation (DNR) -DNR-LIMITED -Do Not Intubate/DNI    Disposition: Continue current acute care  Time spent for the meeting: 15 minutes    Donnice JONELLE Beals, MD  01/05/2024, 12:20 PM   "

## 2024-01-06 DIAGNOSIS — I69391 Dysphagia following cerebral infarction: Secondary | ICD-10-CM | POA: Diagnosis not present

## 2024-01-06 DIAGNOSIS — I63412 Cerebral infarction due to embolism of left middle cerebral artery: Secondary | ICD-10-CM | POA: Diagnosis not present

## 2024-01-06 DIAGNOSIS — I509 Heart failure, unspecified: Secondary | ICD-10-CM | POA: Diagnosis not present

## 2024-01-06 DIAGNOSIS — R569 Unspecified convulsions: Secondary | ICD-10-CM | POA: Diagnosis not present

## 2024-01-06 DIAGNOSIS — I7090 Unspecified atherosclerosis: Secondary | ICD-10-CM | POA: Diagnosis not present

## 2024-01-06 DIAGNOSIS — J9600 Acute respiratory failure, unspecified whether with hypoxia or hypercapnia: Secondary | ICD-10-CM | POA: Diagnosis not present

## 2024-01-06 DIAGNOSIS — I739 Peripheral vascular disease, unspecified: Secondary | ICD-10-CM | POA: Diagnosis not present

## 2024-01-06 DIAGNOSIS — I959 Hypotension, unspecified: Secondary | ICD-10-CM | POA: Diagnosis not present

## 2024-01-06 DIAGNOSIS — R579 Shock, unspecified: Secondary | ICD-10-CM

## 2024-01-06 DIAGNOSIS — I110 Hypertensive heart disease with heart failure: Secondary | ICD-10-CM | POA: Diagnosis not present

## 2024-01-06 DIAGNOSIS — I4891 Unspecified atrial fibrillation: Secondary | ICD-10-CM | POA: Diagnosis not present

## 2024-01-06 DIAGNOSIS — N179 Acute kidney failure, unspecified: Secondary | ICD-10-CM | POA: Diagnosis not present

## 2024-01-06 LAB — GLUCOSE, CAPILLARY
Glucose-Capillary: 104 mg/dL — ABNORMAL HIGH (ref 70–99)
Glucose-Capillary: 104 mg/dL — ABNORMAL HIGH (ref 70–99)
Glucose-Capillary: 109 mg/dL — ABNORMAL HIGH (ref 70–99)
Glucose-Capillary: 110 mg/dL — ABNORMAL HIGH (ref 70–99)
Glucose-Capillary: 111 mg/dL — ABNORMAL HIGH (ref 70–99)
Glucose-Capillary: 113 mg/dL — ABNORMAL HIGH (ref 70–99)
Glucose-Capillary: 126 mg/dL — ABNORMAL HIGH (ref 70–99)

## 2024-01-06 LAB — BASIC METABOLIC PANEL WITH GFR
Anion gap: 27 — ABNORMAL HIGH (ref 5–15)
BUN: 158 mg/dL — ABNORMAL HIGH (ref 8–23)
CO2: 11 mmol/L — ABNORMAL LOW (ref 22–32)
Calcium: 9.1 mg/dL (ref 8.9–10.3)
Chloride: 103 mmol/L (ref 98–111)
Creatinine, Ser: 8.68 mg/dL — ABNORMAL HIGH (ref 0.61–1.24)
GFR, Estimated: 6 mL/min — ABNORMAL LOW
Glucose, Bld: 118 mg/dL — ABNORMAL HIGH (ref 70–99)
Potassium: 4.5 mmol/L (ref 3.5–5.1)
Sodium: 140 mmol/L (ref 135–145)

## 2024-01-06 LAB — CBC
HCT: 34.3 % — ABNORMAL LOW (ref 39.0–52.0)
Hemoglobin: 11.3 g/dL — ABNORMAL LOW (ref 13.0–17.0)
MCH: 26.8 pg (ref 26.0–34.0)
MCHC: 32.9 g/dL (ref 30.0–36.0)
MCV: 81.5 fL (ref 80.0–100.0)
Platelets: 60 K/uL — ABNORMAL LOW (ref 150–400)
RBC: 4.21 MIL/uL — ABNORMAL LOW (ref 4.22–5.81)
RDW: 15.1 % (ref 11.5–15.5)
WBC: 18.9 K/uL — ABNORMAL HIGH (ref 4.0–10.5)
nRBC: 1 % — ABNORMAL HIGH (ref 0.0–0.2)

## 2024-01-06 LAB — MAGNESIUM: Magnesium: 3.1 mg/dL — ABNORMAL HIGH (ref 1.7–2.4)

## 2024-01-06 LAB — PHOSPHORUS: Phosphorus: 9.7 mg/dL — ABNORMAL HIGH (ref 2.5–4.6)

## 2024-01-06 NOTE — Progress Notes (Signed)
 01/06/2024  Seen in f/u for coma  S: No changes Unresponsive  O:    01/06/2024    7:00 AM 01/06/2024    6:45 AM 01/06/2024    6:30 AM  Vitals with BMI  Systolic 109 117 894  Diastolic 81 79 71  Pulse 93     Brainstem reflexes intact but GCS3 otherwise Lungs scattered rhonci ext warm Ongoing shock state noted  A: Multiorgan failure progressive after left MCA stroke Previous L PCA stroke, poststroke epilepsy, Afib not on AC due to GIB Dementia, ward of state living in group home  P: Continue supportive care including vent giving time for family to get to town to say goodbye Should heart stop in interim we will allow natural passing In hospital death expected, DNR   Dan Dellamae Rosamilia MD Pulmonary Critical Care Medicine Securechat if during day (7a-7p) [phone] if after hours (7p-7a)

## 2024-01-06 NOTE — Progress Notes (Signed)
 STROKE TEAM PROGRESS NOTE    SIGNIFICANT HOSPITAL EVENTS 12/28: Presented to [facility] due to acute unresponsiveness. CTA showed left M2 occlusion. Transferred to [facility] for emergent thrombectomy.   INTERIM HISTORY/SUBJECTIVE No family at the bedside.  Patient condition continues to decline now with further worsening renal function with creatinine above 8 and hypotension.  Patient remains intubated on full support.  He developed respiratory distress mainly in attempt to wean..  BP currently low requiring vasopressors.  Palliative care team has been consulted.  Patient's ward of state has been contacted for DNR status by critical care team.    Neurological exam remains poor and unchanged with worsening labs.  CBC    Component Value Date/Time   WBC 18.9 (H) 01/06/2024 0712   RBC 4.21 (L) 01/06/2024 0712   HGB 11.3 (L) 01/06/2024 0712   HCT 34.3 (L) 01/06/2024 0712   PLT 60 (L) 01/06/2024 0712   MCV 81.5 01/06/2024 0712   MCH 26.8 01/06/2024 0712   MCHC 32.9 01/06/2024 0712   RDW 15.1 01/06/2024 0712   LYMPHSABS 0.3 (L) 12/31/2023 1434   MONOABS 2.6 (H) 12/31/2023 1434   EOSABS 0.0 12/31/2023 1434   BASOSABS 0.0 12/31/2023 1434    BMET    Component Value Date/Time   NA 140 01/06/2024 0712   K 4.5 01/06/2024 0712   CL 103 01/06/2024 0712   CO2 11 (L) 01/06/2024 0712   GLUCOSE 118 (H) 01/06/2024 0712   BUN 158 (H) 01/06/2024 0712   CREATININE 8.68 (H) 01/06/2024 0712   CALCIUM  9.1 01/06/2024 0712   GFRNONAA 6 (L) 01/06/2024 0712    IMAGING past 24 hours No results found.   Vitals:   01/06/24 0630 01/06/24 0645 01/06/24 0700 01/06/24 1146  BP: 105/71 117/79 109/81 120/89  Pulse:   93 87  Resp:   (!) 22 (!) 22  Temp:   99.3 F (37.4 C) 98.8 F (37.1 C)  TempSrc:      SpO2:   100% 100%  Weight:      Height:         PHYSICAL EXAM General: Critically ill, intubated no sedation CV: A-fib on monitor, controlled rate Respiratory: Intubated and mechanically  ventilated   NEURO:  Mental Status/Language: obtunded, does not open eyes to voice or stimuli, does not follow commands.    Cranial Nerves:  II: PERRL. No blink to threat on left III, IV, VI: Left eye deviation V, VII:  Weak Left Corneal Reflex.  IX, X: ETT in place. Cough/Gag present.  XI: Unable to assess XII: tongue is midline without fasciculations. Motor:  No spontaneous movement seen.  Sensation: No response to noxious stimuli Coordination: unable to perform Gait- deferred   ASSESSMENT/PLAN  [name] is a 69 y.o. male with history of left PCA stroke, poststroke epilepsy (Depakote  and Keppra ), A-fib not on anticoagulation, GI bleed September 2025 with supratherapeutic INR who [name] 12/28 due to acute unresponsiveness. EMS noted right gaze deviation, no speech, not following commands, no withdrawal to stimuli. Intubated for airway protection. Patient does live in group home and it was difficult to determine his baseline. EMS reported typically verbal and ambulatory with some cognitive impairment at baseline (on Aricept ). CTA revealed left M2 occlusion. Patient transferred emergently to [facility] for thrombectomy. NIH on Admission to [facility] 40.   Acute Ischemic Infarct:  left MCA territory s/p mechanical thrombectomy with 2B revascularization Etiology:  likely cardioembolic, known A-fib not on anticoagulation  Code Stroke CT head  No acute intracranial abnormality. ASPECTS score 10. Remote infarcts and chronic microvascular ischemic disease. CTA head & neck  Occlusion of a proximal M2 branch of the left MCA. Reconstitution of multiple M3 and M4 branches in the posterior left MCA territory, likely via collaterals. 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 Additional mild atherosclerosis as above. MRI   left  parietal, insular and temporal white matter acute infarct.  Chronic encephalomalacia left medial occipital lobe. 2D Echo left ventricular EF 60-65%.  Left atrial size normal. LDL 46 HgbA1c 5.6 VTE prophylaxis - Heparin  SQ No antithrombotic prior to admission, now on ASA 81mg  per tube or rectal.  Therapy recommendations:  Pending Disposition:  pending   Acute Respiratory Failure s/p intubation CCM Attending Extubation precluded by mental status.    Hx of Seizures Acute Breakthrough Seizures Home Meds: Depakote  750mg  Q12H, Keppra  1000mg  BID Continued via IV access EEG: - Continuous slow, generalized and maximal left fronto-temporal region  IMPRESSION: This study is uggestive of cortical dysfunction arising from fronto-temporal region likely secondary to underlying structural abnormality. Additionally there is generalized cerebral dysfunction (encephalopathy). No seizures or epileptiform discharges were seen throughout the recording. EEG discontinued 12/31 Currently off sedation, no seizure activity seen  Depakote  level: 50 Keppra  level: 33.8   Afib, no OAC No OAC d/t history of GI Bleed Started on Aspirin  only Home medication Depakote , interaction noted    Hypertension Home meds:  Coreg  3.125mg  BID, torsemide  20mg  QAM Stable, Clevi off Blood Pressure Goal: SBP less than 160    Hyperlipidemia Home meds:  Crestor  20mg  LDL 46, goal < 70 High intensity statin not indicated due to LDL within goal on current treatment Continue statin when able to take PO   Dysphagia Patient has post-stroke dysphagia, SLP consulted NG tube in place   AKI Worsening BUN and creatinine-97/5.49 Management per critical care Renal ultrasound unremarkable Did receive 2 doses of Lasix  on 12/31  Other Stroke Risk Factors Congestive heart failure   Other Active Problems Leukocytosis, Fever Continues on broad-spectrum antibiotics Meningitis less likely Blood cultures pending Leukocytosis could  be caused by seizures Monitor, continue to trend Hx of CKD, stage 3 Hx of Cognitive Impairment d/t Dementia Home Aricept  continued  Hospital day # 6     Patient condition continues to decline and neurological prognosis is quite poor given his worsening renal function and  hypotension.  Patient is a ward of the state and we will guardian has been contacted and agrees to DNR and do not escalate care.  Await arrival of patient's mother today to discuss withdrawal of ventilatory support . discussed with Dr. Claudene critical care medicine   This patient is critically ill and at significant risk of neurological worsening, death and care requires constant monitoring of vital signs, hemodynamics,respiratory and cardiac monitoring, extensive review of multiple databases, frequent neurological assessment, discussion with family, other specialists and medical decision making of high complexity.I have made any additions or clarifications directly to the above note.This critical care time does not reflect procedure time, or teaching time or supervisory time of PA/NP/Med Resident etc but could involve care discussion time.  I spent 30 minutes of neurocritical care time  in the care of  this patient.      Eather Popp, MD Medical Director [facility] Stroke Center Pager: [phone] 01/06/2024 1:07 PM   To contact Stroke  Continuity provider, please refer to [email or web address]. After hours, contact General Neurology

## 2024-01-07 DIAGNOSIS — I4891 Unspecified atrial fibrillation: Secondary | ICD-10-CM | POA: Diagnosis not present

## 2024-01-07 DIAGNOSIS — R569 Unspecified convulsions: Secondary | ICD-10-CM | POA: Diagnosis not present

## 2024-01-07 DIAGNOSIS — N179 Acute kidney failure, unspecified: Secondary | ICD-10-CM | POA: Diagnosis not present

## 2024-01-07 DIAGNOSIS — I69391 Dysphagia following cerebral infarction: Secondary | ICD-10-CM | POA: Diagnosis not present

## 2024-01-07 DIAGNOSIS — Z66 Do not resuscitate: Secondary | ICD-10-CM

## 2024-01-07 DIAGNOSIS — I509 Heart failure, unspecified: Secondary | ICD-10-CM | POA: Diagnosis not present

## 2024-01-07 DIAGNOSIS — I739 Peripheral vascular disease, unspecified: Secondary | ICD-10-CM | POA: Diagnosis not present

## 2024-01-07 DIAGNOSIS — I110 Hypertensive heart disease with heart failure: Secondary | ICD-10-CM | POA: Diagnosis not present

## 2024-01-07 DIAGNOSIS — Z515 Encounter for palliative care: Secondary | ICD-10-CM

## 2024-01-07 DIAGNOSIS — I959 Hypotension, unspecified: Secondary | ICD-10-CM | POA: Diagnosis not present

## 2024-01-07 DIAGNOSIS — J9600 Acute respiratory failure, unspecified whether with hypoxia or hypercapnia: Secondary | ICD-10-CM | POA: Diagnosis not present

## 2024-01-07 DIAGNOSIS — I63412 Cerebral infarction due to embolism of left middle cerebral artery: Secondary | ICD-10-CM | POA: Diagnosis not present

## 2024-01-07 DIAGNOSIS — I7090 Unspecified atherosclerosis: Secondary | ICD-10-CM | POA: Diagnosis not present

## 2024-01-07 LAB — BASIC METABOLIC PANEL WITH GFR
Anion gap: 29 — ABNORMAL HIGH (ref 5–15)
BUN: 175 mg/dL — ABNORMAL HIGH (ref 8–23)
CO2: 9 mmol/L — ABNORMAL LOW (ref 22–32)
Calcium: 9 mg/dL (ref 8.9–10.3)
Chloride: 98 mmol/L (ref 98–111)
Creatinine, Ser: 9.2 mg/dL — ABNORMAL HIGH (ref 0.61–1.24)
GFR, Estimated: 6 mL/min — ABNORMAL LOW
Glucose, Bld: 96 mg/dL (ref 70–99)
Potassium: 5.1 mmol/L (ref 3.5–5.1)
Sodium: 136 mmol/L (ref 135–145)

## 2024-01-07 LAB — PHOSPHORUS: Phosphorus: 10.3 mg/dL — ABNORMAL HIGH (ref 2.5–4.6)

## 2024-01-07 LAB — GLUCOSE, CAPILLARY
Glucose-Capillary: 90 mg/dL (ref 70–99)
Glucose-Capillary: 87 mg/dL (ref 70–99)

## 2024-01-07 MED ORDER — SODIUM CHLORIDE 0.9 % IV SOLN: INTRAVENOUS | Status: DC

## 2024-01-07 MED ORDER — MORPHINE BOLUS VIA INFUSION
5.0000 mg | INTRAVENOUS | Status: DC | PRN
  Administered 2024-01-07 (×3): 5 mg via INTRAVENOUS

## 2024-01-07 MED ORDER — ACETAMINOPHEN 325 MG PO TABS: 650.0000 mg | ORAL_TABLET | Freq: Four times a day (QID) | ORAL | Status: DC | PRN

## 2024-01-07 MED ORDER — GLYCOPYRROLATE 0.2 MG/ML IJ SOLN: 0.2000 mg | INTRAMUSCULAR | Status: DC | PRN

## 2024-01-07 MED ORDER — ONDANSETRON 4 MG PO TBDP: 4.0000 mg | ORAL_TABLET | Freq: Four times a day (QID) | ORAL | Status: DC | PRN

## 2024-01-07 MED ORDER — POLYVINYL ALCOHOL 1.4 % OP SOLN: 1.0000 [drp] | Freq: Four times a day (QID) | OPHTHALMIC | Status: DC | PRN

## 2024-01-07 MED ORDER — CHLORHEXIDINE GLUCONATE CLOTH 2 % EX PADS
6.0000 | MEDICATED_PAD | Freq: Every day | CUTANEOUS | Status: DC
  Administered 2024-01-07: 6 via TOPICAL

## 2024-01-07 MED ORDER — GLYCOPYRROLATE 1 MG PO TABS: 1.0000 mg | ORAL_TABLET | ORAL | Status: DC | PRN

## 2024-01-07 MED ORDER — GLYCOPYRROLATE 0.2 MG/ML IJ SOLN
0.2000 mg | INTRAMUSCULAR | Status: DC | PRN
  Administered 2024-01-07: 0.2 mg via INTRAVENOUS
  Filled 2024-01-07: qty 1

## 2024-01-07 MED ORDER — MORPHINE 100MG IN NS 100ML (1MG/ML) PREMIX INFUSION
0.0000 mg/h | INTRAVENOUS | Status: DC
  Administered 2024-01-07: 5 mg/h via INTRAVENOUS
  Filled 2024-01-07: qty 100

## 2024-01-07 MED ORDER — ACETAMINOPHEN 650 MG RE SUPP: 650.0000 mg | Freq: Four times a day (QID) | RECTAL | Status: DC | PRN

## 2024-01-07 MED ORDER — ONDANSETRON HCL 4 MG/2ML IJ SOLN: 4.0000 mg | Freq: Four times a day (QID) | INTRAMUSCULAR | Status: DC | PRN

## 2024-01-07 MED ORDER — MIDAZOLAM HCL (PF) 2 MG/2ML IJ SOLN
2.0000 mg | INTRAMUSCULAR | Status: DC | PRN
  Administered 2024-01-07: 4 mg via INTRAVENOUS
  Filled 2024-01-07: qty 4

## 2024-01-15 ENCOUNTER — Ambulatory Visit: Admitting: Podiatry

## 2024-02-04 NOTE — Progress Notes (Signed)
 Chaplain responded to unit page - family requesting chaplain following transition to comfort care.  Mother Nathan Shepherd and cousin Nathan Shepherd bedside. Nathan Shepherd shared a bit about pt Nathan Shepherd and requested prayer, which I happily provided. Music being played in the room. As we were taking some time to simply remain present, Nathan Shepherd appeared to be struggling a bit for air. I relayed this concern to the RN who very promptly increased medication.  I returned after a bit to deliver a prayer blanket and a Bible, marked Psalm 23 (as mentioned by Nathan Shepherd). Family expressed gratitude for chaplain support, and we continue to remain available.

## 2024-02-04 NOTE — Progress Notes (Signed)
 STROKE TEAM PROGRESS NOTE    SIGNIFICANT HOSPITAL EVENTS 12/28: Presented to [facility] due to acute unresponsiveness. CTA showed left M2 occlusion. Transferred to [facility] for emergent thrombectomy.   INTERIM HISTORY/SUBJECTIVE No family at the bedside.  Patient condition continues to decline now with further worsening renal function and hypotension.  Serum creatinine is up to 9.2.  Patient is still requiring vasopressor drip and patient remains intubated on full support.  He developed respiratory distress mainly in attempt to wean.SABRA  Await arrival of patient's mother discussed withdrawal of ventilator.  atient's ward of state has been contacted for DNR status by critical care team.    Neurological exam remains poor and unchanged with worsening labs.  CBC    Component Value Date/Time   WBC 18.9 (H) 01/06/2024 0712   RBC 4.21 (L) 01/06/2024 0712   HGB 11.3 (L) 01/06/2024 0712   HCT 34.3 (L) 01/06/2024 0712   PLT 60 (L) 01/06/2024 0712   MCV 81.5 01/06/2024 0712   MCH 26.8 01/06/2024 0712   MCHC 32.9 01/06/2024 0712   RDW 15.1 01/06/2024 0712   LYMPHSABS 0.3 (L) 12/31/2023 1434   MONOABS 2.6 (H) 12/31/2023 1434   EOSABS 0.0 12/31/2023 1434   BASOSABS 0.0 12/31/2023 1434    BMET    Component Value Date/Time   NA 136 January 20, 2024 0424   K 5.1 01-20-2024 0424   CL 98 Jan 20, 2024 0424   CO2 9 (L) 01-20-24 0424   GLUCOSE 96 January 20, 2024 0424   BUN 175 (H) 01/20/24 0424   CREATININE 9.20 (H) Jan 20, 2024 0424   CALCIUM  9.0 01/20/2024 0424   GFRNONAA 6 (L) 01/20/24 0424    IMAGING past 24 hours No results found.   Vitals:   2024/01/20 0915 01/20/2024 0930 01/20/24 0945 01/20/2024 1000  BP: 104/87 114/79 117/88 (!) 132/105  Pulse: 88 93 85 91  Resp: (!) 22 (!) 22 (!) 22 (!) 21  Temp: 98.2 F (36.8 C) 98.2 F (36.8 C) 98.2 F (36.8 C) 98.1 F (36.7 C)  TempSrc:      SpO2: 96% 96% 97% 97%  Weight:      Height:         PHYSICAL EXAM General: Critically ill, intubated no  sedation CV: A-fib on monitor, controlled rate Respiratory: Intubated and mechanically ventilated   NEURO:  Mental Status/Language: obtunded, does not open eyes to voice or stimuli, does not follow commands.    Cranial Nerves:  II: PERRL. No blink to threat on left III, IV, VI: Left eye deviation V, VII:  Weak Left Corneal Reflex.  IX, X: ETT in place. Cough/Gag present.  XI: Unable to assess XII: tongue is midline without fasciculations. Motor:  No spontaneous movement seen.  Sensation: No response to noxious stimuli Coordination: unable to perform Gait- deferred   ASSESSMENT/PLAN  [name] is a 69 y.o. male with history of left PCA stroke, poststroke epilepsy (Depakote  and Keppra ), A-fib not on anticoagulation, GI bleed September 2025 with supratherapeutic INR who presented to [facility] 12/28 due to acute unresponsiveness. EMS noted right gaze deviation, no speech, not following commands, no withdrawal to stimuli. Intubated for airway protection. Patient does live in group home and it was difficult to determine his baseline. EMS reported typically verbal and ambulatory with some cognitive impairment at baseline (on Aricept ). CTA revealed left M2 occlusion. Patient transferred emergently to [facility] for thrombectomy. NIH on Admission to [facility].   Acute Ischemic Infarct:  left MCA territory  s/p mechanical thrombectomy with 2B revascularization Etiology:  likely cardioembolic, known A-fib not on anticoagulation Code Stroke CT head  No acute intracranial abnormality. ASPECTS score 10. Remote infarcts and chronic microvascular ischemic disease. CTA head & neck  Occlusion of a proximal M2 branch of the left MCA. Reconstitution of multiple M3 and M4 branches in the posterior left MCA territory, likely via collaterals. 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 Right vertebral artery with severe stenosis of the distal  V4 segment, unchanged since 2018. Additional mild atherosclerosis as above. MRI   left parietal, insular and temporal white matter acute infarct.  Chronic encephalomalacia left medial occipital lobe. 2D Echo left ventricular EF 60-65%.  Left atrial size normal. LDL 46 HgbA1c 5.6 VTE prophylaxis - Heparin  SQ No antithrombotic prior to admission, now on ASA 81mg  per tube or rectal.  Therapy recommendations:  Pending Disposition:  pending   Acute Respiratory Failure s/p intubation CCM Attending Extubation precluded by mental status.    Hx of Seizures Acute Breakthrough Seizures Home Meds: Depakote  750mg  Q12H, Keppra  1000mg  BID Continued via IV access EEG: - Continuous slow, generalized and maximal left fronto-temporal region  IMPRESSION: This study is uggestive of cortical dysfunction arising from fronto-temporal region likely secondary to underlying structural abnormality. Additionally there is generalized cerebral dysfunction (encephalopathy). No seizures or epileptiform discharges were seen throughout the recording. EEG discontinued 12/31 Currently off sedation, no seizure activity seen  Depakote  level: 50 Keppra  level: 33.8   Afib, no OAC No OAC d/t history of GI Bleed Started on Aspirin  only Home medication Depakote , interaction noted    Hypertension Home meds:  Coreg  3.125mg  BID, torsemide  20mg  QAM Stable, Clevi off Blood Pressure Goal: SBP less than 160    Hyperlipidemia Home meds:  Crestor  20mg  LDL 46, goal < 70 High intensity statin not indicated due to LDL within goal on current treatment Continue statin when able to take PO   Dysphagia Patient has post-stroke dysphagia, SLP consulted NG tube in place   AKI Worsening BUN and creatinine-97/5.49 Management per critical care Renal ultrasound unremarkable Did receive 2 doses of Lasix  on 12/31  Other Stroke Risk Factors Congestive heart failure   Other Active Problems Leukocytosis, Fever Continues on  broad-spectrum antibiotics Meningitis less likely Blood cultures pending Leukocytosis could be caused by seizures Monitor, continue to trend Hx of CKD, stage 3 Hx of Cognitive Impairment d/t Dementia Home Aricept  continued  Hospital day # 7      Patient condition continues to decline and neurological prognosis is quite poor given his worsening renal function and  hypotension.  Patient is a ward of the state and we will guardian has been contacted and agrees to DNR and hopefully he will agree to withdrawal of ventilatory support soon await arrival of patient's mother l.  Discussed with Dr. Claudene critical care medicine  This patient is critically ill and at significant risk of neurological worsening, death and care requires constant monitoring of vital signs, hemodynamics,respiratory and cardiac monitoring, extensive review of multiple databases, frequent neurological assessment, discussion with family, other specialists and medical decision making of high complexity.I have made any additions or clarifications directly to the above note.This critical care time does not reflect procedure time, or teaching time or supervisory time of PA/NP/Med Resident etc but could involve care discussion time.  I spent 30 minutes of neurocritical care time  in the care of  this patient.         Eather Popp,  MD Medical Director [facility] Pager: [phone] 2024-01-13 11:27 AM   To contact Stroke Continuity provider, please refer to [email or web address]. After hours, contact General Neurology

## 2024-02-04 NOTE — Progress Notes (Signed)
 20-Jan-2024 Still awaiting call from guardian from on-call service.  Rolan Sharps MD PCCM

## 2024-02-04 NOTE — TOC Progression Note (Addendum)
 Transition of Care [facility]) - Progression Note    Patient Details  Name: Bosco Paparella MRN: [id number] Date of Birth: Nov 21, 1955  [facility] CM/SW Contact  Gwenn Frieze [place], [place] Phone Number: 2024/01/10, 2:30 PM  Clinical Narrative: Secure chat received from MD at 2:14pm re issue reaching pt's legal guardian. Call placed to [facility] DSS on call social worker [phone] requesting immediate return call from pt's guardian. Spoke to [place] who reports she has received multiple calls from [facility] and is actively trying to reach pt's guardian. Emmie states [name] is not the guardian Johann Gelineau is the guardian but is on leave and Emmie is not able to reach the on call supervisor. Team updated re efforts.   Frieze Gwenn, MSW, [name] [phone] (coverage)        Expected Discharge Plan: Skilled Nursing Facility Barriers to Discharge: Continued Medical Work up               Expected Discharge Plan and Services       Living arrangements for the past 2 months: Skilled Nursing Facility                                       Social Drivers of Health (SDOH) Interventions SDOH Screenings   Food Insecurity: No Food Insecurity (09/19/2023)  Housing: Low Risk (09/19/2023)  Transportation Needs: No Transportation Needs (09/19/2023)  Utilities: Not At Risk (09/19/2023)  Social Connections: Socially Isolated (09/19/2023)  Tobacco Use: Low Risk (12/31/2023)    Readmission Risk Interventions    09/12/2023    7:53 AM  Readmission Risk Prevention Plan  Transportation Screening Complete  HRI or Home Care Consult Complete  Social Work Consult for Recovery Care Planning/Counseling Complete  Palliative Care Screening Not Applicable  Medication Review [employer]) Complete

## 2024-02-04 NOTE — Progress Notes (Signed)
 24-Jan-2024 DSS representative reached and case discussed.  Appropriate forms filled out and all in agreement to proceed with comfort measures with family at bedside.  Orders placed.  Rolan Sharps MD PCCM

## 2024-02-04 NOTE — Progress Notes (Signed)
 01-30-24 Family (mother, cousin) at bedside. Situation discussed. All agree given current circumstances Barrett would want to pass peacefully. Calling guardianship answering service for final agreement to proceed with comfort care.  Rolan Sharps MD PCCM

## 2024-02-04 NOTE — Progress Notes (Signed)
 Extubation Procedure Note  Patient Details:   Name: Nathan Shepherd DOB: 10-29-1955 MRN: [id number]   Airway Documentation:    Vent end date: Jan 24, 2024 Vent end time: 1655   Evaluation  O2 sats: currently acceptable Complications: No apparent complications Patient did tolerate procedure well. Bilateral Breath Sounds: Expiratory wheezes, Inspiratory wheezes   No terminally extubated to room air   Nathan Shepherd 01/24/24, 4:55 PM

## 2024-02-04 NOTE — Death Summary Note (Signed)
 " DEATH SUMMARY   Patient Details  Name: Nathan Shepherd MRN: [id number] DOB: 1955-07-23  Admission/Discharge Information   Admit Date:  2024/01/13  Date of Death: Date of Death: 01/20/2024  Time of Death: Time of Death: 01-28-1747  Length of Stay: 7  Referring Physician: Patient, No Pcp Per     Diagnoses  Preliminary cause of death: Left MCA stroke x 7 days  Secondary Diagnoses (including complications and co-morbidities):  Acute renal failure multifactorial Shock state, mixed neurogenic and septic POA MRSA bronchitis/PNA Afib not on [facility] 2/2 GIB Ileus    Brief Hospital Course (including significant findings, care, treatment, and services provided and events leading to death)  Nathan Shepherd is a 69 y.o. 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  At 1 PM EMS was called because patient had noted right gaze deviation and was not answering questions or following commands.  He was taken to [facility] where he was out of the window for thrombolytic, he was treated with Vimpat  and Ativan  and had minimal subsequent seizure activity.  Him imaging reveals left M2 occlusion for which he was taken for IR thrombectomy at [facility].  2B TICI score after 3 passes without complication.  The patient was transferred to the ICU intubated.  Also had noted fevers earlier today though not on on arrival to the ICU.  Blood cultures and broad-spectrum antibiotics initiated.  Patient unfortunately never regained consciousness and went into progressive shock and renal failure.  Family came in from out of town and said their goodbyes.   Patient guardian (DSS) called and informed of terminal diagnosis.  All in agreement to allow Nathan Shepherd a natural peaceful passing.  Pertinent Labs and Studies  Significant Diagnostic Studies DG Abd 1 View Result  Date: 01/05/2024 EXAM: 1 VIEW XRAY OF THE ABDOMEN 01/05/2024 12:20:51 AM COMPARISON: 13-Jan-2024 CLINICAL HISTORY: Ileus (HCC) FINDINGS: LINES, TUBES AND DEVICES: Enteric tube in place with tip at the gastric fundus. BOWEL: Similar appearance of dilated small bowel loops. SOFT TISSUES: No abnormal calcifications. BONES: No acute fracture. IMPRESSION: 1. Similar appearance of dilated small bowel loops, consistent with ileus. Electronically signed by: Waddell Calk MD 01/05/2024 05:44 AM EST RP Workstation: [id number]   DG CHEST PORT 1 VIEW Result Date: 01/04/2024 EXAM: 1 VIEW(S) XRAY OF THE CHEST 01/04/2024 11:02:16 PM COMPARISON: 01/13/24 CLINICAL HISTORY: Mechanical failure of ventilator (HCC) [id number] FINDINGS: LINES, TUBES AND DEVICES: Endotracheal tube in place with tip 5 cm above the carina. Enteric tube in place with tip and side port overlying the expected region of the gastric lumen. LUNGS AND PLEURA: No focal pulmonary opacity. No pleural effusion. No pneumothorax. HEART AND MEDIASTINUM: Atherosclerotic plaque noted. No acute abnormality of the cardiac and mediastinal silhouettes. BONES AND SOFT TISSUES: No acute osseous abnormality. IMPRESSION: 1. Endotracheal and enteric tubes are appropriately positioned. 2. No acute cardiopulmonary process. 3. Atherosclerotic plaque. Electronically signed by: Dorethia Molt MD 01/04/2024 11:39 PM EST RP Workstation: [id number]   IR PERCUTANEOUS ART THROMBECTOMY/INFUSION INTRACRANIAL INC DIAG ANGIO Result Date: 01/02/2024 INDICATION: Acute Large Vessel occlusion, left middle cerebral artery occlusion EXAM: Mechanical thrombectomy, MEDICATIONS: No additional ANESTHESIA/SEDATION: General CONTRAST:  42mL OMNIPAQUE  IOHEXOL  300 MG/ML  SOLN FLUOROSCOPY: Radiation Exposure Index (as provided by the fluoroscopic device): 506 MGy reference air Kerma COMPLICATIONS: None immediate. PRE-PROCEDURE: Maximal Sterile Barrier Technique was utilized  including caps, mask, sterile gowns,  sterile gloves, sterile drape, hand hygiene and skin antiseptic. A timeout was performed prior to the initiation of the procedure. PROCEDURE: Femoral access was obtained with ultrasound using direct visualization of the needle puncture into the artery. A 90 cm neuron max sheath was placed. The neuron max was advanced over a Simmons 2 select catheter into the left internal carotid artery. The free climb 55 catheter was advanced over a synchro support wire to the level of the occlusion in the posterior division left M2 and connected to suction. The catheter was then withdrawn under aspiration. This did not result in recanalization. This procedure was repeated a second time in the identical manner, and in this occasion the embolus was successfully removed. There remained an embolus in a M3 branch of the anterior division. The zoom 45 catheter was advanced over a velocity catheter to the level of the occlusion and connected to suction. The catheter was then withdrawn under aspiration. This did not result in removal of the embolus. A follow-up arteriogram demonstrated good collaterals to the occluded M3 branch of the anterior middle cerebral artery division. It was felt that further attempts to remove the embolus from the M3 branch in the anterior division were not likely to be successful and were more likely to cause hemorrhage. It was also felt that the collaterals were most likely adequate to prevent infarction. Hemostasis was obtained with Angio-Seal. FINDINGS: The initial angiogram demonstrates a normal carotid bifurcation. There is complete occlusion of the posterior division M2 which is the dominant division of the middle cerebral artery. There is an M3 branch occlusion involving the anterior division of the middle cerebral artery. The anterior cerebral artery is normal. Following the intervention, the posterior division is restored with normal flow in all branches. There is a persistent M3 branch occlusion in the  anterior division. The anterior cerebral artery is normal. IMPRESSION: Emboli involving the posterior division M2 of the left middle cerebral artery and a M3 division of the anterior division of the left middle cerebral artery. The posterior division M2 was successfully opened. The M3 division of the anterior division was not successfully opened. TICI 2b Codes: 38354, 23062 Electronically Signed   By: Nancyann Burns M.D.   On: 01/02/2024 11:19   IR US  Guide Vasc Access Right Result Date: 01/02/2024 INDICATION: Acute Large Vessel occlusion, left middle cerebral artery occlusion EXAM: Mechanical thrombectomy, MEDICATIONS: No additional ANESTHESIA/SEDATION: General CONTRAST:  42mL OMNIPAQUE  IOHEXOL  300 MG/ML  SOLN FLUOROSCOPY: Radiation Exposure Index (as provided by the fluoroscopic device): 506 MGy reference air Kerma COMPLICATIONS: None immediate. PRE-PROCEDURE: Maximal Sterile Barrier Technique was utilized including caps, mask, sterile gowns, sterile gloves, sterile drape, hand hygiene and skin antiseptic. A timeout was performed prior to the initiation of the procedure. PROCEDURE: Femoral access was obtained with ultrasound using direct visualization of the needle puncture into the artery. A 90 cm neuron max sheath was placed. The neuron max was advanced over a Simmons 2 select catheter into the left internal carotid artery. The free climb 55 catheter was advanced over a synchro support wire to the level of the occlusion in the posterior division left M2 and connected to suction. The catheter was then withdrawn under aspiration. This did not result in recanalization. This procedure was repeated a second time in the identical manner, and in this occasion the embolus was successfully removed. There remained an embolus in a M3 branch of the anterior division. The zoom 45 catheter was advanced over  a velocity catheter to the level of the occlusion and connected to suction. The catheter was then withdrawn under  aspiration. This did not result in removal of the embolus. A follow-up arteriogram demonstrated good collaterals to the occluded M3 branch of the anterior middle cerebral artery division. It was felt that further attempts to remove the embolus from the M3 branch in the anterior division were not likely to be successful and were more likely to cause hemorrhage. It was also felt that the collaterals were most likely adequate to prevent infarction. Hemostasis was obtained with Angio-Seal. FINDINGS: The initial angiogram demonstrates a normal carotid bifurcation. There is complete occlusion of the posterior division M2 which is the dominant division of the middle cerebral artery. There is an M3 branch occlusion involving the anterior division of the middle cerebral artery. The anterior cerebral artery is normal. Following the intervention, the posterior division is restored with normal flow in all branches. There is a persistent M3 branch occlusion in the anterior division. The anterior cerebral artery is normal. IMPRESSION: Emboli involving the posterior division M2 of the left middle cerebral artery and a M3 division of the anterior division of the left middle cerebral artery. The posterior division M2 was successfully opened. The M3 division of the anterior division was not successfully opened. TICI 2b Codes: 38354, 23062 Electronically Signed   By: Nancyann Burns M.D.   On: 01/02/2024 11:19   US  RENAL Result Date: 01/02/2024 CLINICAL DATA:  Acute kidney injury EXAM: RENAL / URINARY TRACT ULTRASOUND COMPLETE COMPARISON:  None Available. FINDINGS: Right Kidney: Renal measurements: 9.2 x 4.7 x 4.3 cm = volume: 98 mL. 5.7 cm exophytic simple cyst is noted. Echogenicity within normal limits. No mass or hydronephrosis visualized. Left Kidney: Renal measurements: 7.9 x 4.9 x 4.6 cm = volume: 94 mL. Echogenicity within normal limits. No mass or hydronephrosis visualized. Bladder: Decompressed secondary to Foley catheter.  Other: None. IMPRESSION: No significant renal abnormality is noted. Electronically Signed   By: Lynwood Landy Raddle M.D.   On: 01/02/2024 09:32   Overnight EEG with video Result Date: 01/02/2024 Shelton Arlin KIDD, MD     01/03/2024  7:14 AM Patient Name: Nathan Shepherd MRN: [id number] Epilepsy Attending: Arlin KIDD Shelton Referring Physician/Provider: Judithe Rocky BROCKS, NP Duration: 01/01/2024 0948 to 01/02/2024 1100 Patient history: 70yo M with acute L MCA CVA, L M2 occlusion Hx sz, with concern for status. EEG to evaluate for seizure Level of alertness:  comatose/ lethargic AEDs during EEG study: LEV, VPA Technical aspects: This EEG study was done with scalp electrodes positioned according to the 10-20 International system of electrode placement. Electrical activity was reviewed with band pass filter of 1-70Hz , sensitivity of 7 uV/mm, display speed of 7mm/sec with a 60Hz  notched filter applied as appropriate. EEG data were recorded continuously and digitally stored.  Video monitoring was available and reviewed as appropriate. Description: EEG showed continuous generalized and maximal left fronto-temporal region 3 to 6 Hz theta-delta slowing admixed with 12-15hz  beta activity. Hyperventilation and photic stimulation were not performed.  EEG was disconnected between 11/01/2023 2103 to 2220 ABNORMALITY - Continuous slow, generalized and maximal left fronto-temporal region IMPRESSION: This study is uggestive of cortical dysfunction arising from fronto-temporal region likely secondary to underlying structural abnormality. Additionally there is generalized cerebral dysfunction (encephalopathy). No seizures or epileptiform discharges were seen throughout the recording. Arlin KIDD Shelton   MR BRAIN WO CONTRAST Result Date: 01/02/2024 EXAM: MRI BRAIN WITHOUT CONTRAST 01/01/2024 10:03:37 PM TECHNIQUE: Multiplanar multisequence MRI  of the head/brain was performed without the administration of intravenous contrast.  COMPARISON: MR Head Without Contrast 09/10/2023. CLINICAL HISTORY: Stroke, follow up. FINDINGS: BRAIN AND VENTRICLES: There are new areas of restricted diffusion involving the left parietal cortex, left insular region, left temporal periventricular white matter and left corona radiata. Chronic encephalomalacia changes are again demonstrated within the left medial occipital lobe. There is moderately advanced periventricular and deep cerebral white matter disease. No intracranial hemorrhage. No mass. No midline shift. No hydrocephalus. The sella is unremarkable. Normal flow voids. ORBITS: No acute abnormality. SINUSES AND MASTOIDS: There is moderate mucosal disease within the paranasal sinuses. BONES AND SOFT TISSUES: Normal marrow signal. No acute soft tissue abnormality. IMPRESSION: 1. New areas of restricted diffusion involving the left parietal cortex, left insular region, left temporal periventricular white matter, and left corona radiata, consistent with acute/subacute infarcts. 2. Chronic encephalomalacia changes within the left medial occipital lobe. 3. Moderately advanced periventricular and deep cerebral white matter disease. 4. Moderate mucosal disease within the paranasal sinuses. Electronically signed by: Evalene Coho MD 01/02/2024 05:23 AM EST RP Workstation: [id number]   ECHOCARDIOGRAM COMPLETE Result Date: 01/01/2024    ECHOCARDIOGRAM REPORT   Patient Name:   Nathan Shepherd [facility] Date of Exam: 01/01/2024 Medical Rec #:  [id number]             Height:       76.0 in Accession #:    [id number]            Weight:       198.0 lb Date of Birth:  04-09-1955              BSA:          2.206 m Patient Age:    68 years              BP:           138/119 mmHg Patient Gender: M                     HR:           87 bpm. Exam Location:  Inpatient Procedure: 2D Echo, Cardiac Doppler and Color Doppler (Both Spectral and Color            Flow Doppler were utilized during procedure). Indications:    Stroke   History:        Patient has prior history of Echocardiogram examinations, most                 recent 09/18/2023. Arrythmias:Atrial Fibrillation; Risk                 Factors:Hypertension and Dyslipidemia.  Sonographer:    Sherlean Dubin Referring Phys: [id number] LEITA JONELLE EINSTEIN  Sonographer Comments: Echo performed with patient supine and on artificial respirator. Image acquisition challenging due to respiratory motion and Image acquisition challenging due to patient body habitus. IMPRESSIONS  1. Left ventricular ejection fraction, by estimation, is 60 to 65%. The left ventricle has normal function. The left ventricle has no regional wall motion abnormalities. Left ventricular diastolic function could not be evaluated.  2. Right ventricular systolic function is mildly reduced. The right ventricular size is mildly enlarged.  3. The mitral valve is normal in structure. No evidence of mitral valve regurgitation. No evidence of mitral stenosis.  4. The aortic valve is tricuspid. Aortic valve regurgitation is not visualized. Aortic valve sclerosis is present, with no evidence of aortic valve stenosis. FINDINGS  Left Ventricle: Left ventricular ejection fraction, by estimation, is 60 to 65%. The left ventricle has normal function. The left ventricle has no regional wall motion abnormalities. The left ventricular internal cavity size was normal in size. There is  no left ventricular hypertrophy. Left ventricular diastolic function could not be evaluated due to atrial fibrillation. Left ventricular diastolic function could not be evaluated. Normal left ventricular filling pressure. Right Ventricle: The right ventricular size is mildly enlarged. No increase in right ventricular wall thickness. Right ventricular systolic function is mildly reduced. Left Atrium: Left atrial size was normal in size. Right Atrium: Right atrial size was normal in size. Pericardium: There is no evidence of pericardial effusion. Mitral Valve: The  mitral valve is normal in structure. No evidence of mitral valve regurgitation. No evidence of mitral valve stenosis. Tricuspid Valve: The tricuspid valve is normal in structure. Tricuspid valve regurgitation is mild . No evidence of tricuspid stenosis. Aortic Valve: The aortic valve is tricuspid. Aortic valve regurgitation is not visualized. Aortic valve sclerosis is present, with no evidence of aortic valve stenosis. Aortic valve mean gradient measures 3.0 mmHg. Aortic valve peak gradient measures 4.9  mmHg. Aortic valve area, by VTI measures 2.16 cm. Pulmonic Valve: The pulmonic valve was normal in structure. Pulmonic valve regurgitation is not visualized. No evidence of pulmonic stenosis. Aorta: The aortic root is normal in size and structure. Venous: The inferior vena cava was not well visualized. IAS/Shunts: No atrial level shunt detected by color flow Doppler.  LEFT VENTRICLE PLAX 2D LVIDd:         4.30 cm   Diastology LVIDs:         3.20 cm   LV e' medial:    4.68 cm/s LV PW:         1.20 cm   LV E/e' medial:  15.1 LV IVS:        1.30 cm   LV e' lateral:   9.57 cm/s LVOT diam:     2.10 cm   LV E/e' lateral: 7.4 LV SV:         32 LV SV Index:   15 LVOT Area:     3.46 cm  RIGHT VENTRICLE RV Basal diam:  4.50 cm RV Mid diam:    4.40 cm RV S prime:     12.90 cm/s TAPSE (M-mode): 1.1 cm LEFT ATRIUM             Index        RIGHT ATRIUM           Index LA diam:        4.80 cm 2.18 cm/m   RA Area:     25.10 cm LA Vol (A2C):   66.7 ml 30.24 ml/m  RA Volume:   76.60 ml  34.73 ml/m LA Vol (A4C):   49.1 ml 22.26 ml/m LA Biplane Vol: 59.3 ml 26.89 ml/m  AORTIC VALVE AV Area (Vmax):    2.14 cm AV Area (Vmean):   2.05 cm AV Area (VTI):     2.16 cm AV Vmax:           110.33 cm/s AV Vmean:          75.100 cm/s AV VTI:            0.149 m AV Peak Grad:      4.9 mmHg AV Mean Grad:      3.0 mmHg LVOT Vmax:         68.15 cm/s LVOT  Vmean:        44.500 cm/s LVOT VTI:          0.093 m LVOT/AV VTI ratio: 0.62  AORTA Ao  Root diam: 3.30 cm Ao Asc diam:  3.50 cm MITRAL VALVE MV Area (PHT): 5.84 cm    SHUNTS MV Decel Time: 130 msec    Systemic VTI:  0.09 m MV E velocity: 70.60 cm/s  Systemic Diam: 2.10 cm MV A velocity: 36.50 cm/s MV E/A ratio:  1.93 Nathan Shepherd Electronically signed by Nathan Shepherd Signature Date/Time: 01/01/2024/8:49:10 PM    Final    DG Chest Port 1 View Result Date: 12/31/2023 CLINICAL DATA:  Endotracheal and OG tube placement. EXAM: PORTABLE CHEST 1 VIEW, ABDOMEN PORTABLE. COMPARISON:  12/23/2023. FINDINGS: Chest: The heart size and mediastinal contours are stable. There is atherosclerotic calcification of the aorta. No consolidation, effusion, or pneumothorax is seen. An endotracheal tube terminates 3.1 cm above the carina. Abdomen: There is a nonobstructive bowel-gas pattern in the upper abdomen. An enteric tube terminates in the stomach and appears appropriate in position. IMPRESSION: 1. No active disease. 2. Support apparatus as described above. Electronically Signed   By: Leita Birmingham M.D.   On: 12/31/2023 19:33   DG Abd Portable 1V Result Date: 12/31/2023 CLINICAL DATA:  Endotracheal and OG tube placement. EXAM: PORTABLE CHEST 1 VIEW, ABDOMEN PORTABLE. COMPARISON:  12/23/2023. FINDINGS: Chest: The heart size and mediastinal contours are stable. There is atherosclerotic calcification of the aorta. No consolidation, effusion, or pneumothorax is seen. An endotracheal tube terminates 3.1 cm above the carina. Abdomen: There is a nonobstructive bowel-gas pattern in the upper abdomen. An enteric tube terminates in the stomach and appears appropriate in position. IMPRESSION: 1. No active disease. 2. Support apparatus as described above. Electronically Signed   By: Leita Birmingham M.D.   On: 12/31/2023 19:33   DG Chest Portable 1 View Result Date: 12/31/2023 CLINICAL DATA:  Post intubation and OG tube placement. EXAM: PORTABLE CHEST 1 VIEW COMPARISON:  12/31/2023. FINDINGS: Heart is enlarged and  the mediastinal contour is within normal limits. There is atherosclerotic calcification of the aorta. The lungs are clear without effusions or infiltrates. An enteric tube terminates in the stomach and appears appropriate in position. The endotracheal tube terminates 5.5 cm above the carina. No acute osseous abnormality. IMPRESSION: 1. No active disease. 2. Support apparatus as described above. Electronically Signed   By: Leita Birmingham M.D.   On: 12/31/2023 16:39   DG CHEST PORT 1 VIEW Result Date: 12/31/2023 CLINICAL DATA:  Altered mental status, code stroke. EXAM: PORTABLE CHEST 1 VIEW COMPARISON:  09/20/2023. FINDINGS: The heart is enlarged and the mediastinal contour is within normal limits. There is atherosclerotic calcification of the aorta. No consolidation, effusion, or pneumothorax is seen. No acute osseous abnormality. IMPRESSION: No active disease. Electronically Signed   By: Leita Birmingham M.D.   On: 12/31/2023 16:38   CT ANGIO HEAD NECK W WO CM (CODE STROKE) Result Date: 12/31/2023 EXAM: CTA Head and Neck with Intravenous Contrast. CLINICAL HISTORY: Neuro deficit, acute, stroke suspected. TECHNIQUE: Axial CTA images of the head and neck performed with intravenous contrast. MIP reconstructed images were created and reviewed. Axial computed tomography images of the head/brain performed without intravenous contrast. Note: Per PQRS, the description of internal carotid artery percent stenosis, including 0 percent or normal exam, is based on [employer] American Symptomatic Carotid Endarterectomy Trial (NASCET) criteria. Dose reduction technique was used including one or more of the following: automated exposure  control, adjustment of mA and kV according to patient size, and/or iterative reconstruction. CONTRAST: With and without. 75 mL (iohexol  (OMNIPAQUE ) 350 MG/ML injection 75 mL IOHEXOL  350 MG/ML SOLN). COMPARISON: CT head and CTA head and neck dated 08/17/2016. FINDINGS: SINUSES AND MASTOIDS: Similar  appearance of diffuse paranasal sinus mucosal thickening particularly in the frontal sinuses and right maxillary sinus. The mastoid air cells are clear. CTA NECK: COMMON CAROTID ARTERIES: Minimal atherosclerosis at the right carotid bifurcation without hemodynamically significant stenosis. Minimal atherosclerosis at the left carotid bifurcation without hemodynamically significant stenosis. No dissection or occlusion. INTERNAL CAROTID ARTERIES: There is tortuosity of the proximal and mid right cervical ICA. Mild tortuosity of the proximal left cervical ICA. No stenosis by NASCET criteria. No dissection or occlusion. VERTEBRAL ARTERIES: The right vertebral artery is patent from the origin to the intracranial segment. 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 There are a few areas of atherosclerosis and mild narrowing along the right V2 segment which are slightly increased from prior. 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 The left vertebral artery remains occluded to the level of the distal V4 segment at which point there is contrast filling of the vessel likely via retrograde flow. Atherosclerosis noted along the left V4 segment which is increased from prior. CTA HEAD: ANTERIOR CEREBRAL ARTERIES: The anterior cerebral arteries are patent bilaterally. No significant stenosis. No occlusion. No aneurysm. MIDDLE CEREBRAL ARTERIES: The right MCA is patent. There is mild narrowing of a few proximal right M2 branches. The left M1 segment is patent. There is occlusion of a proximal M2 branch of the left MCA (series 7, image 116). There is reconstitution of multiple M3 and M4 branches within the posterior aspect of the left MCA territory. No aneurysm. POSTERIOR CEREBRAL ARTERIES: Diminutive appearance of the left PCA without focal  occlusion likely related to the prior infarct. The right PCA is patent. No significant stenosis. No occlusion. No aneurysm. BASILAR ARTERY: The basilar artery is patent. No significant stenosis. No occlusion. No aneurysm. OTHER: Mild atherosclerosis of the aortic arch. Atherosclerosis at the left subclavian artery origin and along the proximal right subclavian artery which are increased from prior without evidence of significant stenosis. SOFT TISSUES: No acute finding. No masses or lymphadenopathy. BONES: Partial fusion of the C5 and C6 vertebral bodies. No acute osseous abnormality. LUNGS: There are a few bilateral upper lobe pulmonary nodules measuring up to 3 mm. VASCULATURE: There is atherosclerosis of the bilateral carotid siphons. Mild stenosis of the left cavernous ICA. IMPRESSION: 1. Occlusion of a proximal M2 branch of the left MCA. Reconstitution of multiple M3 and M4 branches in the posterior left MCA territory, likely via collaterals. 2. 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 3. 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 4. Additional mild atherosclerosis as above. 5. Bilateral upper lobe pulmonary nodules measuring up to 3 mm, with optional non-contrast chest CT at 12 months if the patient is high risk, per Fleischner Society Guidelines. 6. Impression #2 and #3 discussed with Dr. [name] at 2:33 PM on 12/31/23 and impression #1 discussed at 2:46 PM on 12/31/23. Electronically signed by: Donnice Mania MD 12/31/2023 02:52 PM EST RP Workstation: [id number]   CT HEAD CODE STROKE WO CONTRAST (LKW 0-4.5h, LVO 0-24h)  Result Date: 12/31/2023 EXAM: CT HEAD WITHOUT 12/31/2023 02:13:57 PM TECHNIQUE: CT of the head was performed without the administration of intravenous contrast. Automated exposure control, iterative reconstruction, and/or weight based adjustment of the mA/kV was utilized to reduce the radiation dose to  as low as reasonably achievable. COMPARISON: 02/22/2023 CLINICAL HISTORY: Neuro deficit, acute, stroke suspected FINDINGS: BRAIN AND VENTRICLES: No acute intracranial hemorrhage. No mass effect or midline shift. No extra-axial fluid collection. No evidence of acute infarct. No hydrocephalus. Remote left occipital infarct. Remote lacunar infarct in left thalamus. Patchy white matter hypodensities compatible with chronic microvascular ischemic disease. Similar appearance of small remote infarcts in the bifrontal periventricular white matter. [place] Stroke Program Early CT (ASPECT) score: Ganglionic (caudate, internal capsule, lentiform nucleus, insula, M1-M3): 7. Supraganglionic (M4-M6): 3. Total: 10. ORBITS: No acute abnormality. SINUSES AND MASTOIDS: Opacified frontal and right maxillary sinus. Polypoid paranasal sinus mucosal thickening. SOFT TISSUES AND SKULL: No acute skull fracture. No acute soft tissue abnormality. Calcific atherosclerosis. IMPRESSION: 1. No acute intracranial abnormality. 2. ASPECTS score 10. 3. Remote infarcts and chronic microvascular ischemic disease. 4. Findings messaged to Dr. [name] at 2:21 PM on 12/31/23. Electronically signed by: Donnice Mania MD 12/31/2023 02:21 PM EST RP Workstation: [id number]    Microbiology Recent Results (from the past 240 hours)  Resp panel by RT-PCR (RSV, Flu A&B, Covid) Anterior Nasal Swab     Status: None   Collection Time: 12/31/23  2:16 PM   Specimen: Anterior Nasal Swab  Result Value Ref Range Status   SARS Coronavirus 2 by RT PCR NEGATIVE NEGATIVE Final    Comment: (NOTE) SARS-CoV-2 target nucleic acids are NOT DETECTED.  The SARS-CoV-2 RNA is generally detectable in upper respiratory specimens during the acute phase of infection. The lowest concentration of SARS-CoV-2 viral copies this assay can detect is 138 copies/mL. A negative result does not preclude SARS-Cov-2 infection and should not be used as the sole basis for treatment  or other patient management decisions. A negative result may occur with  improper specimen collection/handling, submission of specimen other than nasopharyngeal swab, presence of viral mutation(s) within the areas targeted by this assay, and inadequate number of viral copies(<138 copies/mL). A negative result must be combined with clinical observations, patient history, and epidemiological information. The expected result is Negative.  Fact Sheet for Patients:  bloggercourse.com  Fact Sheet for Healthcare Providers:  seriousbroker.it  This test is no t yet approved or cleared by the [place]  FDA and  has been authorized for detection and/or diagnosis of SARS-CoV-2 by FDA under an Emergency Use Authorization (EUA). This EUA will remain  in effect (meaning this test can be used) for the duration of the COVID-19 declaration under Section 564(b)(1) of the Act, 21 U.S.C.section 360bbb-3(b)(1), unless the authorization is terminated  or revoked sooner.       Influenza A by PCR NEGATIVE NEGATIVE Final   Influenza B by PCR NEGATIVE NEGATIVE Final    Comment: (NOTE) The Xpert Xpress SARS-CoV-2/FLU/RSV plus assay is intended as an aid in the diagnosis of influenza from Nasopharyngeal swab specimens and should not be used as a sole basis for treatment. Nasal washings and aspirates are unacceptable for Xpert Xpress SARS-CoV-2/FLU/RSV testing.  Fact Sheet for Patients: bloggercourse.com  Fact Sheet for Healthcare Providers: seriousbroker.it  This test is not yet approved or cleared by the [place]  FDA and has been authorized for detection and/or diagnosis of SARS-CoV-2 by FDA under an Emergency Use Authorization (EUA). This EUA will remain in effect (  meaning this test can be used) for the duration of the COVID-19 declaration under Section 564(b)(1) of the Act, 21 U.S.C. section  360bbb-3(b)(1), unless the authorization is terminated or revoked.     Resp Syncytial Virus by PCR NEGATIVE NEGATIVE Final    Comment: (NOTE) Fact Sheet for Patients: bloggercourse.com  Fact Sheet for Healthcare Providers: seriousbroker.it  This test is not yet approved or cleared by the [place]  FDA and has been authorized for detection and/or diagnosis of SARS-CoV-2 by FDA under an Emergency Use Authorization (EUA). This EUA will remain in effect (meaning this test can be used) for the duration of the COVID-19 declaration under Section 564(b)(1) of the Act, 21 U.S.C. section 360bbb-3(b)(1), unless the authorization is terminated or revoked.  Performed at [facility], [name], [place], [place] [address]   Culture, blood (Routine X 2) w Reflex to ID Panel     Status: None   Collection Time: 12/31/23  6:49 PM   Specimen: BLOOD  Result Value Ref Range Status   Specimen Description BLOOD BLOOD LEFT HAND  Final   Special Requests   Final    BOTTLES DRAWN AEROBIC ONLY Blood Culture adequate volume   Culture   Final    NO GROWTH 5 DAYS Performed at [facility], [address] [place], [place] [address]    Report Status 01/05/2024 FINAL  Final  Culture, blood (Routine X 2) w Reflex to ID Panel     Status: None   Collection Time: 12/31/23  6:51 PM   Specimen: BLOOD  Result Value Ref Range Status   Specimen Description BLOOD BLOOD RIGHT HAND  Final   Special Requests   Final    BOTTLES DRAWN AEROBIC AND ANAEROBIC Blood Culture results may not be optimal due to an inadequate volume of blood received in culture bottles   Culture   Final    NO GROWTH 5 DAYS Performed at [facility], [address] [place], [place] [address]    Report Status 01/05/2024 FINAL  Final  MRSA Next Gen by PCR, Nasal     Status: Abnormal   Collection Time: 12/31/23  8:26 PM   Specimen: Nasal Mucosa; Nasal Swab  Result Value Ref  Range Status   MRSA by PCR Next Gen DETECTED (A) NOT DETECTED Final    Comment: RESULT CALLED TO, READ BACK BY AND VERIFIED WITH: J MOTLEY RN 12/31/2023 @ 2323 BY AB (NOTE) The GeneXpert MRSA Assay (FDA approved for NASAL specimens only), is one component of a comprehensive MRSA colonization surveillance program. It is not intended to diagnose MRSA infection nor to guide or monitor treatment for MRSA infections. Test performance is not FDA approved in patients less than 44 years old. Performed at [facility], [address], [place], [place] [address]   Urine Culture (for pregnant, neutropenic or urologic patients or patients with an indwelling urinary catheter)     Status: None   Collection Time: 01/02/24  8:33 AM   Specimen: Urine, Catheterized  Result Value Ref Range Status   Specimen Description URINE, CATHETERIZED  Final   Special Requests NONE  Final   Culture   Final    NO GROWTH Performed at [facility], [address], [place], [place] [address]    Report Status 01/04/2024 FINAL  Final  Culture, Respiratory w Gram Stain     Status: None   Collection Time: 01/02/24 12:22 PM   Specimen: Tracheal Aspirate; Respiratory  Result Value Ref Range Status   Specimen Description TRACHEAL ASPIRATE  Final   Special Requests NONE  Final   Gram Stain   Final    RARE WBC PRESENT, PREDOMINANTLY PMN FEW GRAM POSITIVE COCCI RARE GRAM NEGATIVE RODS Performed at [facility], [address], [place], [place] [address]    Culture FEW METHICILLIN RESISTANT STAPHYLOCOCCUS AUREUS  Final   Report Status 01/05/2024 FINAL  Final   Organism ID, Bacteria METHICILLIN RESISTANT STAPHYLOCOCCUS AUREUS  Final      Susceptibility   Methicillin resistant staphylococcus aureus - MIC*    CIPROFLOXACIN  >=8 RESISTANT Resistant     ERYTHROMYCIN >=8 RESISTANT Resistant     GENTAMICIN <=0.5 SENSITIVE Sensitive     OXACILLIN >=4 RESISTANT Resistant     TETRACYCLINE <=1 SENSITIVE  Sensitive     VANCOMYCIN  1 SENSITIVE Sensitive     TRIMETH/SULFA <=10 SENSITIVE Sensitive     CLINDAMYCIN >=8 RESISTANT Resistant     RIFAMPIN <=0.5 SENSITIVE Sensitive     Inducible Clindamycin NEGATIVE Sensitive     LINEZOLID  2 SENSITIVE Sensitive     * FEW METHICILLIN RESISTANT STAPHYLOCOCCUS AUREUS    Lab Basic Metabolic Panel: Recent Labs  Lab 01/02/24 0420 01/03/24 0357 01/04/24 0319 01/04/24 2234 01/05/24 0434 01/06/24 0712 January 13, 2024 0424  NA 142 145 148* 146* 147* 140 136  K 4.6 4.2 3.7 4.5 4.6 4.5 5.1  CL 106 109 110  --  108 103 98  CO2 20* 17* 18*  --  14* 11* 9*  GLUCOSE 117* 115* 106*  --  89 118* 96  BUN 53* 77* 97*  --  125* 158* 175*  CREATININE 3.32* 4.75* 5.49*  --  6.89* 8.68* 9.20*  CALCIUM  9.6 8.8* 9.4  --  9.4 9.1 9.0  MG 2.2 2.2 2.5*  --  3.1* 3.1*  --   PHOS 6.2*  --  7.1*  --  8.9* 9.7* 10.3*   Liver Function Tests: Recent Labs  Lab 01/02/24 0420  ALBUMIN  2.6*   No results for input(s): LIPASE, AMYLASE in the last 168 hours. No results for input(s): AMMONIA in the last 168 hours. CBC: Recent Labs  Lab 01/02/24 0834 01/03/24 0357 01/04/24 0319 01/04/24 2234 01/06/24 0712  WBC 19.6* 18.6* 16.7*  --  18.9*  HGB 11.5* 10.8* 9.7* 12.2* 11.3*  HCT 35.8* 33.9* 30.4* 36.0* 34.3*  MCV 82.7 83.3 84.4  --  81.5  PLT 141* 123* 86*  --  60*   Cardiac Enzymes: No results for input(s): CKTOTAL, CKMB, CKMBINDEX, TROPONINI in the last 168 hours. Sepsis Labs: Recent Labs  Lab 01/02/24 0834 01/03/24 0357 01/04/24 0319 01/06/24 0712  PROCALCITON 100.00  --   --   --   WBC 19.6* 18.6* 16.7* 18.9*      Toribio JAYSON Sharps 01/08/2024, 6:35 PM   "

## 2024-02-04 NOTE — Consult Note (Signed)
 "                                                                                   Consultation Note Date: 01-30-24   Patient Name: Nathan Shepherd  DOB: [birth year]  MRN: [id number]  Age / Sex: 69 y.o., male  PCP: Patient, No Pcp Per Referring Physician: Claudene Toribio BROCKS, MD  Reason for Consultation: Establishing goals of care, Psychosocial/spiritual support, and Withdrawal of life-sustaining treatment  HPI/Patient Profile: 69 y.o. male   admitted on 12/31/2023 Nathan Shepherd due to acute unresponsiveness.  CTA showed left M2 occlusion.  Transferred to [facility] for emergent thrombectomy.  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   Concept of Palliative Care was introduced as specialized medical care for people and their families living with serious illness.  If focuses on providing relief from the symptoms and stress of a serious illness.  The goal is to improve quality of life for both the patient and the family.   Values and goals of care important to patient and family were attempted to be elicited.  Created space and opportunity for mother/ family to explore thoughts and feelings regarding current medical situation.  I spoke to patient's mother and cousin at bedside.  Father verbalized an understanding of the seriousness of the current medical situation and associated poor prognosis.  Mother clearly verbalizes hope for liberation from  the ventilator and a shift to full comfort path,   foregoing  life-prolonging measures and allowing for a natural death.      Emotional support offered  Attending team is waiting for guardian to respond to calls in order to move forward with shift to full comfort       Questions and concerns addressed.  Family encouraged to call with questions or concerns.     PMT will continue to support holistically.             Primary Diagnoses: Present on Admission:  Status epilepticus (HCC)   I have reviewed the medical record, interviewed the patient and family, and examined the patient. The following aspects are pertinent.  Past Medical History:  Diagnosis Date   CHF (congestive heart failure) (HCC)    CKD (chronic kidney disease) stage 3, GFR 30-59 ml/min (HCC)    Edema    Hyperlipemia    Hypertension    Seizures (HCC)    Social History   Socioeconomic History   Marital status: Single    Spouse name: Not on file   Number of children: Not on file   Years of education: Not on file   Highest education level: Not on file  Occupational History   Not on file  Tobacco Use   Smoking status: Never   Smokeless tobacco: Never  Vaping Use   Vaping status: Never Used  Substance and Sexual Activity   Alcohol  use: Not Currently   Drug use: Never   Sexual activity: Not Currently  Other Topics Concern   Not on file  Social History Narrative   ** Merged History Encounter **       Social Drivers of Health   Tobacco Use: Low Risk (12/31/2023)   Patient History    Smoking Tobacco Use: Never    Smokeless Tobacco Use: Never    Passive Exposure: Not on file  Financial Resource Strain: Not on file  Food Insecurity: No Food Insecurity (09/19/2023)   Epic    Worried About Programme Researcher, Broadcasting/film/video in the Last Year: Never true    Ran Out of Food in the Last Year: Never true  Transportation Needs: No Transportation Needs (09/19/2023)   Epic    Lack of Transportation (Medical): No    Lack of  Transportation (Non-Medical): No  Physical Activity: Not on file  Stress: Not on file  Social Connections: Socially Isolated (09/19/2023)   Social Connection and Isolation Panel    Frequency of Communication with Friends and Family: Three times a week    Frequency of Social Gatherings with Friends and Family: Three times a week    Attends Religious Services: Never    Active Member of Clubs or Organizations: No    Attends Banker Meetings: Never    Marital Status: Never married  Depression (PHQ2-9): Not on file  Alcohol  Screen: Not on file  Housing: Low Risk (09/19/2023)   Epic    Unable to Pay for Housing in the Last Year: No    Number of Times Moved in the Last Year: 0    Homeless in the Last Year: No  Utilities: Not At Risk (09/19/2023)   Epic    Threatened with loss of utilities: No  Health Literacy: Not on file   Family History  Problem Relation Age of Onset   Colon cancer Neg Hx    Liver disease Neg Hx    Scheduled Meds:  aspirin   81 mg Per Tube Daily   Or   aspirin   300 mg Rectal Daily   Chlorhexidine  Gluconate Cloth  6 each Topical Daily   donepezil   5 mg Per Tube QHS   fiber  1 packet Per Tube BID   heparin  injection (subcutaneous)  5,000 Units Subcutaneous Q8H   insulin  aspart  0-9 Units Subcutaneous Q4H   levETIRAcetam   500 mg Intravenous BID   linezolid   600 mg Per Tube Q12H   mouth rinse  15 mL Mouth Rinse Q2H   pantoprazole  (PROTONIX ) IV  40 mg Intravenous Q12H   sodium bicarbonate   1,300 mg Per Tube TID   Continuous Infusions:  norepinephrine  (LEVOPHED ) Adult infusion 20 mcg/min (2024/07/29 1000)   valproate sodium  57.5 mL/hr at 2024-01-25 1000   PRN Meds:.acetaminophen  (TYLENOL ) oral liquid 160 mg/5 mL, fentaNYL  (SUBLIMAZE ) injection, hydrALAZINE , ipratropium-albuterol , LORazepam , mouth rinse Medications Prior to Admission:  Prior to Admission medications  Medication Sig Start Date End Date Taking? Authorizing Provider  acetaminophen  (TYLENOL )  325 MG tablet Take 2 tablets (650 mg total) by mouth every 6 (six) hours as needed for mild pain (pain score 1-3), moderate pain (pain score 4-6) or fever. 09/24/23  Yes Hongalgi, Anand D, MD  aluminum-magnesium  hydroxide-simethicone (MAALOX) 200-200-20 MG/5ML SUSP Take 30  mLs by mouth every 6 (six) hours as needed (indigestion, heartburn).   Yes [provider]  carvedilol  (COREG ) 3.125 MG tablet Take 1 tablet (3.125 mg total) by mouth 2 (two) times daily. 09/07/23 09/06/24 Yes Emokpae, Courage, MD  Cholecalciferol (VITAMIN D3) 1.25 MG (50000 UT) CAPS Take 50,000 Units by mouth every Monday.   Yes [provider]  diphenhydrAMINE  (BENADRYL ) 25 MG tablet Take 25 mg by mouth every 8 (eight) hours as needed for allergies or itching.   Yes [provider]  divalproex  (DEPAKOTE ) 250 MG DR tablet Take 3 tablets (750 mg total) by mouth every 12 (twelve) hours. 10/24/22 12/31/23 Yes Sreenath, Sudheer B, MD  donepezil  (ARICEPT ) 5 MG tablet Take 5 mg by mouth at bedtime.   Yes [provider]  fluticasone -salmeterol (ADVAIR) 500-50 MCG/ACT AEPB Inhale 1 puff into the lungs in the morning and at bedtime.   Yes [provider]  levETIRAcetam  (KEPPRA ) 1000 MG tablet Take 1,000 mg by mouth 2 (two) times daily.   Yes [provider]  Multiple Vitamin (MULTIVITAMIN WITH MINERALS) TABS tablet Take 1 tablet by mouth daily. 09/25/23  Yes Hongalgi, Anand D, MD  Nutritional Supplements (NUTRITIONAL SUPPLEMENT PO) Take 1 Container by mouth 2 (two) times daily. Magic Cup with breakfast and lunch   Yes [provider]  omeprazole  (PRILOSEC) 20 MG capsule Take 1 capsule (20 mg total) by mouth 2 (two) times daily before a meal. Take 30 min before breakfast and 30 min before dinner 09/07/23  Yes Emokpae, Courage, MD  ondansetron  (ZOFRAN ) 4 MG tablet Take 1 tablet (4 mg total) by mouth every 6 (six) hours as needed for nausea. 09/07/23  Yes Pearlean Manus, MD  polyethylene  glycol (MIRALAX ) 17 g packet Take 17 g by mouth every Monday, Wednesday, and Friday. 09/08/23  Yes Emokpae, Courage, MD  rosuvastatin  (CRESTOR ) 20 MG tablet Take 20 mg by mouth at bedtime.    Yes [provider]  senna-docusate (SENOKOT-S) 8.6-50 MG tablet Take 2 tablets by mouth at bedtime. 09/07/23 09/06/24 Yes Emokpae, Courage, MD  torsemide  (DEMADEX ) 20 MG tablet Take 20 mg by mouth every morning.   Yes [provider]  SYMBICORT  160-4.5 MCG/ACT inhaler Inhale 2 puffs into the lungs in the morning and at bedtime. Patient not taking: Reported on 12/31/2023 09/07/23   Pearlean Manus, MD   Allergies[1] Review of Systems  Unable to perform ROS: Intubated    Physical Exam Constitutional:      Appearance: He is underweight. He is ill-appearing.     Interventions: He is intubated.  Cardiovascular:     Rate and Rhythm: Normal rate.  Pulmonary:     Effort: He is intubated.  Musculoskeletal:     Comments: Generalized muscle atrophy  Skin:    General: Skin is warm and dry.     Vital Signs: BP (!) 132/105   Pulse 91   Temp 98.1 F (36.7 C)   Resp (!) 21   Ht 6' 3.98 (1.93 m)   Wt 86.3 kg   SpO2 97%   BMI 23.17 kg/m  Pain Scale: CPOT   Pain Score: Asleep   SpO2: SpO2: 97 % O2 Device:SpO2: 97 % O2 Flow Rate: .   IO: Intake/output summary:  Intake/Output Summary (Last 24 hours) at 02-05-24 1421 Last data filed at 02/05/2024 1357 Gross per 24 hour  Intake 1601.09 ml  Output 1260 ml  Net 341.09 ml    LBM: Last BM Date :  (pta) Baseline  Weight: Weight: 89.8 kg Most recent weight: Weight: 86.3 kg     Palliative Assessment/Data:   Discussed with bedside RN,  and treatment team via secure chat  Time 40 minutes    Signed by: Ronal Plants, NP   Please contact Palliative Medicine Team phone at [phone] for questions and concerns.  For individual provider: See Amion                 [1]  Allergies Allergen Reactions   Penicillins Hives     Tolerated Zosyn    "

## 2024-02-04 DEATH — deceased
